# Patient Record
Sex: Female | Born: 1945
Health system: Southern US, Community
[De-identification: ages and names within clinical notes are randomized; demographics above are authoritative.]

## PROBLEM LIST (undated history)

## (undated) DIAGNOSIS — Z974 Presence of external hearing-aid: Secondary | ICD-10-CM

## (undated) DIAGNOSIS — J449 Chronic obstructive pulmonary disease, unspecified: Secondary | ICD-10-CM

## (undated) DIAGNOSIS — I10 Essential (primary) hypertension: Secondary | ICD-10-CM

## (undated) DIAGNOSIS — I272 Pulmonary hypertension, unspecified: Secondary | ICD-10-CM

## (undated) DIAGNOSIS — M810 Age-related osteoporosis without current pathological fracture: Secondary | ICD-10-CM

## (undated) DIAGNOSIS — Z87891 Personal history of nicotine dependence: Secondary | ICD-10-CM

## (undated) DIAGNOSIS — R0600 Dyspnea, unspecified: Secondary | ICD-10-CM

## (undated) DIAGNOSIS — E785 Hyperlipidemia, unspecified: Secondary | ICD-10-CM

## (undated) HISTORY — PX: ABDOMINAL HYSTERECTOMY: SHX81

## (undated) HISTORY — DX: Essential (primary) hypertension: I10

## (undated) HISTORY — PX: OTHER SURGICAL HISTORY: SHX169

## (undated) HISTORY — DX: Personal history of nicotine dependence: Z87.891

## (undated) HISTORY — DX: Chronic obstructive pulmonary disease, unspecified: J44.9

## (undated) HISTORY — DX: Hyperlipidemia, unspecified: E78.5

## (undated) HISTORY — DX: Age-related osteoporosis without current pathological fracture: M81.0

---

## 2006-11-16 ENCOUNTER — Emergency Department: Payer: Self-pay | Admitting: Emergency Medicine

## 2011-06-11 ENCOUNTER — Ambulatory Visit: Payer: Self-pay | Admitting: Unknown Physician Specialty

## 2011-07-08 ENCOUNTER — Ambulatory Visit: Payer: Self-pay | Admitting: Unknown Physician Specialty

## 2011-07-23 LAB — HM COLONOSCOPY

## 2012-11-23 ENCOUNTER — Ambulatory Visit: Payer: Self-pay | Admitting: Family Medicine

## 2013-12-02 ENCOUNTER — Ambulatory Visit: Payer: Self-pay

## 2013-12-30 ENCOUNTER — Ambulatory Visit: Payer: Self-pay

## 2014-03-15 ENCOUNTER — Emergency Department: Payer: Self-pay | Admitting: Emergency Medicine

## 2015-01-12 DIAGNOSIS — E785 Hyperlipidemia, unspecified: Secondary | ICD-10-CM | POA: Diagnosis not present

## 2015-01-12 DIAGNOSIS — Z23 Encounter for immunization: Secondary | ICD-10-CM | POA: Diagnosis not present

## 2015-01-12 DIAGNOSIS — J449 Chronic obstructive pulmonary disease, unspecified: Secondary | ICD-10-CM | POA: Diagnosis not present

## 2015-01-12 DIAGNOSIS — I10 Essential (primary) hypertension: Secondary | ICD-10-CM | POA: Diagnosis not present

## 2015-01-22 ENCOUNTER — Ambulatory Visit
Admit: 2015-01-22 | Disposition: A | Discharge status: Home / Self Care | Payer: Self-pay | Attending: Unknown Physician Specialty | Admitting: Unknown Physician Specialty

## 2015-01-22 DIAGNOSIS — E049 Nontoxic goiter, unspecified: Secondary | ICD-10-CM | POA: Diagnosis not present

## 2015-01-22 DIAGNOSIS — I2510 Atherosclerotic heart disease of native coronary artery without angina pectoris: Secondary | ICD-10-CM | POA: Diagnosis not present

## 2015-01-22 DIAGNOSIS — R918 Other nonspecific abnormal finding of lung field: Secondary | ICD-10-CM | POA: Diagnosis not present

## 2015-01-22 DIAGNOSIS — M419 Scoliosis, unspecified: Secondary | ICD-10-CM | POA: Diagnosis not present

## 2015-01-22 DIAGNOSIS — J440 Chronic obstructive pulmonary disease with acute lower respiratory infection: Secondary | ICD-10-CM | POA: Diagnosis not present

## 2015-07-26 DIAGNOSIS — M810 Age-related osteoporosis without current pathological fracture: Secondary | ICD-10-CM

## 2015-07-26 DIAGNOSIS — I10 Essential (primary) hypertension: Secondary | ICD-10-CM | POA: Insufficient documentation

## 2015-07-26 DIAGNOSIS — F17200 Nicotine dependence, unspecified, uncomplicated: Secondary | ICD-10-CM | POA: Insufficient documentation

## 2015-07-26 DIAGNOSIS — E785 Hyperlipidemia, unspecified: Secondary | ICD-10-CM

## 2015-07-26 DIAGNOSIS — M8580 Other specified disorders of bone density and structure, unspecified site: Secondary | ICD-10-CM | POA: Insufficient documentation

## 2015-07-26 DIAGNOSIS — J449 Chronic obstructive pulmonary disease, unspecified: Secondary | ICD-10-CM | POA: Insufficient documentation

## 2015-07-27 ENCOUNTER — Encounter: Payer: Self-pay | Admitting: Unknown Physician Specialty

## 2015-07-27 ENCOUNTER — Ambulatory Visit (INDEPENDENT_AMBULATORY_CARE_PROVIDER_SITE_OTHER): Payer: Medicare Other | Admitting: Unknown Physician Specialty

## 2015-07-27 VITALS — BP 168/79 | HR 64 | Temp 98.3°F | Ht 59.6 in | Wt 150.4 lb

## 2015-07-27 DIAGNOSIS — Z23 Encounter for immunization: Secondary | ICD-10-CM

## 2015-07-27 DIAGNOSIS — R918 Other nonspecific abnormal finding of lung field: Secondary | ICD-10-CM

## 2015-07-27 DIAGNOSIS — E785 Hyperlipidemia, unspecified: Secondary | ICD-10-CM

## 2015-07-27 DIAGNOSIS — J449 Chronic obstructive pulmonary disease, unspecified: Secondary | ICD-10-CM

## 2015-07-27 DIAGNOSIS — Z0000 Encounter for general adult medical examination without abnormal findings: Secondary | ICD-10-CM | POA: Diagnosis not present

## 2015-07-27 DIAGNOSIS — F17200 Nicotine dependence, unspecified, uncomplicated: Secondary | ICD-10-CM | POA: Diagnosis not present

## 2015-07-27 DIAGNOSIS — I10 Essential (primary) hypertension: Secondary | ICD-10-CM

## 2015-07-27 LAB — MICROALBUMIN, URINE WAIVED
CREATININE, URINE WAIVED: 200 mg/dL (ref 10–300)
MICROALB, UR WAIVED: 10 mg/L (ref 0–19)

## 2015-07-27 MED ORDER — BUDESONIDE-FORMOTEROL FUMARATE 160-4.5 MCG/ACT IN AERO: 2.0000 | INHALATION_SPRAY | Freq: Two times a day (BID) | RESPIRATORY_TRACT | Status: DC

## 2015-07-27 NOTE — Assessment & Plan Note (Signed)
White coat hypertension.  Daughter sill second check at home

## 2015-07-27 NOTE — Progress Notes (Signed)
BP 168/79 mmHg  Pulse 64  Temp(Src) 98.3 F (36.8 C)  Ht 4' 11.6" (1.514 m)  Wt 150 lb 6.4 oz (68.221 kg)  BMI 29.76 kg/m2  SpO2 95%  LMP  (LMP Unknown)   Subjective:    Patient ID: Madison Franklin, female    DOB: 03-Feb-1946, 69 y.o.   MRN: [id number]  HPI: [name] is a 69 y.o. female  Chief Complaint  Patient presents with  . Annual Exam   Hypertension This is a chronic (SBP of 130's outside the office.  Well known white coat hypertension.  ) problem. The problem is unchanged. The problem is controlled. Associated symptoms include anxiety, malaise/fatigue and shortness of breath. Pertinent negatives include no chest pain, headaches or palpitations.   COPD SOB worse when she walks.  She is smoking 4 cigaretts/day.  Due for chest CT in April for f/u lung nodule.  Not taking Symbicort due to cough.    Fall Risk  07/27/2015  Falls in the past year? No   Depression screen PHQ 2/9 07/27/2015  Decreased Interest 0  Down, Depressed, Hopeless 1  PHQ - 2 Score 1    Functional Status Survey: Is the patient deaf or have difficulty hearing?: Yes Does the patient have difficulty seeing, even when wearing glasses/contacts?: Yes Does the patient have difficulty concentrating, remembering, or making decisions?: No Does the patient have difficulty walking or climbing stairs?: Yes Does the patient have difficulty dressing or bathing?: No Does the patient have difficulty doing errands alone such as visiting a doctor's office or shopping?: No  Mental status exam difficult due to hearing.  Able to recall 3 objects.    Relevant past medical, surgical, family and social history reviewed and updated as indicated. Interim medical history since our last visit reviewed. Allergies and medications reviewed and updated.  Review of Systems  Constitutional: Positive for malaise/fatigue.  Respiratory: Positive for shortness of breath.   Cardiovascular: Negative for chest pain and palpitations.   Neurological: Negative for headaches.    Per HPI unless specifically indicated above     Objective:    BP 168/79 mmHg  Pulse 64  Temp(Src) 98.3 F (36.8 C)  Ht 4' 11.6" (1.514 m)  Wt 150 lb 6.4 oz (68.221 kg)  BMI 29.76 kg/m2  SpO2 95%  LMP  (LMP Unknown)  Wt Readings from Last 3 Encounters:  07/27/15 150 lb 6.4 oz (68.221 kg)  01/12/15 154 lb (69.854 kg)    Physical Exam  Constitutional: She is oriented to person, place, and time. She appears well-developed and well-nourished. No distress.  HENT:  Head: Normocephalic and atraumatic.  Eyes: Conjunctivae and lids are normal. Right eye exhibits no discharge. Left eye exhibits no discharge. No scleral icterus.  Cardiovascular: Normal rate and regular rhythm.   Pulmonary/Chest: Effort normal. No respiratory distress. She has wheezes.  Abdominal: Normal appearance and bowel sounds are normal. She exhibits no distension. There is no splenomegaly or hepatomegaly. There is no tenderness.  Musculoskeletal: Normal range of motion.  Neurological: She is alert and oriented to person, place, and time.  Skin: Skin is intact. No rash noted. No pallor.  Psychiatric: She has a normal mood and affect. Her behavior is normal. Judgment and thought content normal.       Assessment & Plan:   Problem List Items Addressed This Visit      Unprioritized   Tobacco use disorder    Encouraged to quit smoking      COPD,  severe ([place])    Restart Spireva.  May need pulmonary referral      Relevant Medications   budesonide-formoterol (SYMBICORT) 160-4.5 MCG/ACT inhaler   Hypertension    White coat hypertension.  Daughter sill second check at home      Relevant Orders   Comprehensive metabolic panel   Microalbumin, Urine Waived   Uric acid   Hyperlipidemia   Relevant Orders   Lipid Panel w/o Chol/HDL Ratio    Other Visit Diagnoses    Immunization due    -  Primary    Relevant Orders    Flu Vaccine QUAD 36+ mos PF IM (Fluarix &  Fluzone Quad PF) (Completed)    Td vaccine greater than or equal to 7yo preservative free IM (Completed)    Multiple lung nodules on CT        Relevant Orders    CT CHEST NODULE FOLLOW UP LOW DOSE W/O    Routine general medical examination at a health care facility        Relevant Orders    Hepatitis C antibody    MM DIGITAL SCREENING BILATERAL        Follow up plan: Return in about 3 months (around 10/27/2015) for COPD.

## 2015-07-27 NOTE — Assessment & Plan Note (Signed)
Encouraged to quit smoking.  

## 2015-07-27 NOTE — Assessment & Plan Note (Signed)
Restart Spireva.  May need pulmonary referral

## 2015-07-28 LAB — LIPID PANEL W/O CHOL/HDL RATIO
Cholesterol, Total: 153 mg/dL (ref 100–199)
HDL: 53 mg/dL (ref >39)
LDL CALC: 74 mg/dL (ref 0–99)
Triglycerides: 128 mg/dL (ref 0–149)
VLDL CHOLESTEROL CAL: 26 mg/dL (ref 5–40)

## 2015-07-28 LAB — COMPREHENSIVE METABOLIC PANEL
ALBUMIN: 4.5 g/dL (ref 3.5–4.8)
ALT: 8 IU/L (ref 0–32)
AST: 17 IU/L (ref 0–40)
Albumin/Globulin Ratio: 1.8 (ref 1.1–2.5)
Alkaline Phosphatase: 87 IU/L (ref 39–117)
BILIRUBIN TOTAL: 0.3 mg/dL (ref 0.0–1.2)
BUN / CREAT RATIO: 25 (ref 11–26)
BUN: 23 mg/dL (ref 8–27)
CHLORIDE: 84 mmol/L — AB (ref 97–108)
CO2: 19 mmol/L (ref 18–29)
CREATININE: 0.91 mg/dL (ref 0.57–1.00)
Calcium: 10 mg/dL (ref 8.7–10.3)
GFR calc non Af Amer: 64 mL/min/{1.73_m2} (ref >59)
GFR, EST AFRICAN AMERICAN: 74 mL/min/{1.73_m2} (ref >59)
GLOBULIN, TOTAL: 2.5 g/dL (ref 1.5–4.5)
GLUCOSE: 88 mg/dL (ref 65–99)
Potassium: 5.4 mmol/L — ABNORMAL HIGH (ref 3.5–5.2)
Sodium: 124 mmol/L — ABNORMAL LOW (ref 134–144)
TOTAL PROTEIN: 7 g/dL (ref 6.0–8.5)

## 2015-07-28 LAB — HEPATITIS C ANTIBODY

## 2015-07-28 LAB — URIC ACID: Uric Acid: 5.7 mg/dL (ref 2.5–7.1)

## 2015-07-30 ENCOUNTER — Encounter: Payer: Self-pay | Admitting: Unknown Physician Specialty

## 2015-07-30 ENCOUNTER — Telehealth: Payer: Self-pay | Admitting: Unknown Physician Specialty

## 2015-07-30 NOTE — Telephone Encounter (Signed)
Phone call with husband as to low sodium.  Patient was there but hard of hearing.  She needs to stop HCTZ and drink less fluids.  Will recheck this on the 26th of this month.  Discussed patient with Dr. Jeananne Rama.

## 2015-08-15 ENCOUNTER — Encounter: Payer: Self-pay | Admitting: Family Medicine

## 2015-08-15 ENCOUNTER — Other Ambulatory Visit: Payer: Self-pay | Admitting: Family Medicine

## 2015-08-15 DIAGNOSIS — Z87891 Personal history of nicotine dependence: Secondary | ICD-10-CM

## 2015-08-15 HISTORY — DX: Personal history of nicotine dependence: Z87.891

## 2015-08-17 ENCOUNTER — Inpatient Hospital Stay: Payer: Medicare Other | Attending: Family Medicine | Admitting: Family Medicine

## 2015-08-17 ENCOUNTER — Encounter: Payer: Self-pay | Admitting: Family Medicine

## 2015-08-17 ENCOUNTER — Ambulatory Visit
Admission: RE | Admit: 2015-08-17 | Discharge: 2015-08-17 | Disposition: A | Discharge status: Home / Self Care | Payer: Medicare Other | Source: Ambulatory Visit | Attending: Family Medicine | Admitting: Family Medicine

## 2015-08-17 DIAGNOSIS — Z122 Encounter for screening for malignant neoplasm of respiratory organs: Secondary | ICD-10-CM | POA: Diagnosis not present

## 2015-08-17 DIAGNOSIS — F17210 Nicotine dependence, cigarettes, uncomplicated: Secondary | ICD-10-CM

## 2015-08-17 DIAGNOSIS — Z87891 Personal history of nicotine dependence: Secondary | ICD-10-CM | POA: Diagnosis not present

## 2015-08-17 DIAGNOSIS — R918 Other nonspecific abnormal finding of lung field: Secondary | ICD-10-CM | POA: Insufficient documentation

## 2015-08-17 NOTE — Progress Notes (Signed)
In accordance with CMS guidelines, patient has meet eligibility criteria including age, absence of signs or symptoms of lung cancer, the specific calculation of cigarette smoking pack-years was 30 years and is a current smoker.   A shared decision-making session was conducted prior to the performance of CT scan. This includes one or more decision aids, includes benefits and harms of screening, follow-up diagnostic testing, over-diagnosis, false positive rate, and total radiation exposure.  Counseling on the importance of adherence to annual lung cancer LDCT screening, impact of co-morbidities, and ability or willingness to undergo diagnosis and treatment is imperative for compliance of the program.  Counseling on the importance of continued smoking cessation for former smokers; the importance of smoking cessation for current smokers and information about tobacco cessation interventions have been given to patient including the [place] at [facility], 1800 quit [place], as well as Cancer Center specific smoking cessation programs.  Written order for lung cancer screening with LDCT has been given to the patient and any and all questions have been answered to the best of my abilities.   Yearly follow up will be scheduled by Shawn Perkins, Thoracic Navigator.   

## 2015-08-23 ENCOUNTER — Other Ambulatory Visit: Payer: Self-pay | Admitting: Unknown Physician Specialty

## 2015-09-03 ENCOUNTER — Telehealth: Payer: Self-pay | Admitting: *Deleted

## 2015-09-03 NOTE — Telephone Encounter (Signed)
Notified patient of LDCT lung cancer screening results of Lung Rads 2 finding with recommendation for 12 month follow up imaging. Also notified of incidental finding noted below.   IMPRESSION: Scattered bilateral pulmonary nodules measuring up to 5.8 mm in the posterior right upper lobe. Lung-RADS Category 2, benign appearance or behavior. Continue annual screening with low-dose chest CT without contrast in 12 months.

## 2015-09-21 ENCOUNTER — Other Ambulatory Visit: Payer: Self-pay | Admitting: Unknown Physician Specialty

## 2015-09-24 ENCOUNTER — Other Ambulatory Visit: Payer: Self-pay | Admitting: Unknown Physician Specialty

## 2015-10-23 ENCOUNTER — Other Ambulatory Visit: Payer: Self-pay | Admitting: Unknown Physician Specialty

## 2015-11-02 ENCOUNTER — Ambulatory Visit (INDEPENDENT_AMBULATORY_CARE_PROVIDER_SITE_OTHER): Payer: Medicare Other | Admitting: Unknown Physician Specialty

## 2015-11-02 ENCOUNTER — Encounter: Payer: Self-pay | Admitting: Unknown Physician Specialty

## 2015-11-02 VITALS — BP 174/101 | HR 71 | Temp 98.5°F | Ht 59.0 in | Wt 151.0 lb

## 2015-11-02 DIAGNOSIS — I10 Essential (primary) hypertension: Secondary | ICD-10-CM | POA: Diagnosis not present

## 2015-11-02 DIAGNOSIS — J449 Chronic obstructive pulmonary disease, unspecified: Secondary | ICD-10-CM

## 2015-11-02 DIAGNOSIS — E871 Hypo-osmolality and hyponatremia: Secondary | ICD-10-CM | POA: Insufficient documentation

## 2015-11-02 NOTE — Assessment & Plan Note (Addendum)
Improve compliance with Symbicort.  Encouraged to quit smoking

## 2015-11-02 NOTE — Assessment & Plan Note (Signed)
Recheck CMP 

## 2015-11-02 NOTE — Progress Notes (Signed)
++++++++++--+  BP 174/101 mmHg  Pulse 71  Temp(Src) 98.5 F (36.9 C)  Ht 4\' 11"  (1.499 m)  Wt 151 lb (68.493 kg)  BMI 30.48 kg/m2  SpO2 93%  LMP  (LMP Unknown)   Subjective:    Patient ID: Madison Franklin, female    DOB: [birth year], 70 y.o.   MRN: [id number]  HPI: Madison Franklin is a 70 y.o. female  Chief Complaint  Patient presents with  . Hyperlipidemia  . Hypertension   Hypertension Well known white coat hypertension Using medications without difficulty Average home BPs checks twice a week and in the low 140's/70's   No problems or lightheadedness No chest pain with exertion or shortness of breath No Edema   COPD Not doing much better.  Using her inhaler only occasionally Feels symptoms are well controlled unless she does a "whole lot of walking Night time symptoms:none ER visits since last visit: none Increased cough: no Increased SOB: no    Relevant past medical, surgical, family and social history reviewed and updated as indicated. Interim medical history since our last visit reviewed. Allergies and medications reviewed and updated.  Review of Systems  Per HPI unless specifically indicated above     Objective:    BP 174/101 mmHg  Pulse 71  Temp(Src) 98.5 F (36.9 C)  Ht 4\' 11"  (1.499 m)  Wt 151 lb (68.493 kg)  BMI 30.48 kg/m2  SpO2 93%  LMP  (LMP Unknown)  Wt Readings from Last 3 Encounters:  11/02/15 151 lb (68.493 kg)  08/17/15 195 lb (88.451 kg)  07/27/15 150 lb 6.4 oz (68.221 kg)    Physical Exam  Constitutional: She is oriented to person, place, and time. She appears well-developed and well-nourished. No distress.  HENT:  Head: Normocephalic and atraumatic.  Eyes: Conjunctivae and lids are normal. Right eye exhibits no discharge. Left eye exhibits no discharge. No scleral icterus.  Neck: Normal range of motion. Neck supple. No JVD present. Carotid bruit is not present.  Cardiovascular: Normal rate, regular rhythm and normal heart sounds.    Pulmonary/Chest: Effort normal and breath sounds normal.  Abdominal: Normal appearance. There is no splenomegaly or hepatomegaly.  Musculoskeletal: Normal range of motion.  Neurological: She is alert and oriented to person, place, and time.  Skin: Skin is warm, dry and intact. No rash noted. No pallor.  Psychiatric: She has a normal mood and affect. Her behavior is normal. Judgment and thought content normal.    Results for orders placed or performed in visit on 07/27/15  Comprehensive metabolic panel  Result Value Ref Range   Glucose 88 65 - 99 mg/dL   BUN 23 8 - 27 mg/dL   Creatinine, Ser 0.91 0.57 - 1.00 mg/dL   GFR calc non Af Amer 64 >59 mL/min/1.73   GFR calc Af Amer 74 >59 mL/min/1.73   BUN/Creatinine Ratio 25 11 - 26   Sodium 124 (L) 134 - 144 mmol/L   Potassium 5.4 (H) 3.5 - 5.2 mmol/L   Chloride 84 (L) 97 - 108 mmol/L   CO2 19 18 - 29 mmol/L   Calcium 10.0 8.7 - 10.3 mg/dL   Total Protein 7.0 6.0 - 8.5 g/dL   Albumin 4.5 3.5 - 4.8 g/dL   Globulin, Total 2.5 1.5 - 4.5 g/dL   Albumin/Globulin Ratio 1.8 1.1 - 2.5   Bilirubin Total 0.3 0.0 - 1.2 mg/dL   Alkaline Phosphatase 87 39 - 117 IU/L   AST 17 0 - 40 IU/L  ALT 8 0 - 32 IU/L  Lipid Panel w/o Chol/HDL Ratio  Result Value Ref Range   Cholesterol, Total 153 100 - 199 mg/dL   Triglycerides 128 0 - 149 mg/dL   HDL 53 >39 mg/dL   VLDL Cholesterol Cal 26 5 - 40 mg/dL   LDL Calculated 74 0 - 99 mg/dL  Microalbumin, Urine Waived  Result Value Ref Range   Microalb, Ur Waived 10 0 - 19 mg/L   Creatinine, Urine Waived 200 10 - 300 mg/dL   Microalb/Creat Ratio <30 <30 mg/g  Uric acid  Result Value Ref Range   Uric Acid 5.7 2.5 - 7.1 mg/dL  Hepatitis C antibody  Result Value Ref Range   Hep C Virus Ab <0.1 0.0 - 0.9 s/co ratio      Assessment & Plan:   Problem List Items Addressed This Visit      Unprioritized   COPD, severe ([facility])    Improve compliance with Symbicort.  Encouraged to quit smoking       Hypertension - Primary    Well known white coat hypertension.  Stopped HCTZ due to hyponatremia.  Check labs today and consider restarting 12.5 mg.        Relevant Orders   Comprehensive metabolic panel   Hyponatremia    Recheck CMP      Relevant Orders   Comprehensive metabolic panel       Follow up plan: Return in about 3 months (around 01/31/2016).

## 2015-11-02 NOTE — Assessment & Plan Note (Addendum)
Well known white coat hypertension.  Stopped HCTZ due to hyponatremia.  Check labs today and consider restarting 12.5 mg.

## 2015-11-03 LAB — COMPREHENSIVE METABOLIC PANEL
A/G RATIO: 1.4 (ref 1.1–2.5)
ALT: 8 IU/L (ref 0–32)
AST: 15 IU/L (ref 0–40)
Albumin: 4.2 g/dL (ref 3.5–4.8)
Alkaline Phosphatase: 118 IU/L — ABNORMAL HIGH (ref 39–117)
BILIRUBIN TOTAL: 0.4 mg/dL (ref 0.0–1.2)
BUN/Creatinine Ratio: 12 (ref 11–26)
BUN: 11 mg/dL (ref 8–27)
CALCIUM: 9.5 mg/dL (ref 8.7–10.3)
CHLORIDE: 102 mmol/L (ref 96–106)
CO2: 19 mmol/L (ref 18–29)
Creatinine, Ser: 0.89 mg/dL (ref 0.57–1.00)
GFR, EST AFRICAN AMERICAN: 76 mL/min/{1.73_m2} (ref >59)
GFR, EST NON AFRICAN AMERICAN: 66 mL/min/{1.73_m2} (ref >59)
GLOBULIN, TOTAL: 3 g/dL (ref 1.5–4.5)
Glucose: 98 mg/dL (ref 65–99)
POTASSIUM: 4.5 mmol/L (ref 3.5–5.2)
SODIUM: 140 mmol/L (ref 134–144)
TOTAL PROTEIN: 7.2 g/dL (ref 6.0–8.5)

## 2015-11-06 ENCOUNTER — Other Ambulatory Visit: Payer: Self-pay | Admitting: Unknown Physician Specialty

## 2015-11-06 MED ORDER — HYDROCHLOROTHIAZIDE 12.5 MG PO CAPS: 12.5000 mg | ORAL_CAPSULE | Freq: Every day | ORAL | Status: DC

## 2015-11-23 ENCOUNTER — Other Ambulatory Visit: Payer: Self-pay | Admitting: Unknown Physician Specialty

## 2016-02-01 ENCOUNTER — Ambulatory Visit (INDEPENDENT_AMBULATORY_CARE_PROVIDER_SITE_OTHER): Payer: Medicare Other | Admitting: Unknown Physician Specialty

## 2016-02-01 ENCOUNTER — Encounter: Payer: Self-pay | Admitting: Unknown Physician Specialty

## 2016-02-01 VITALS — BP 146/74 | HR 60 | Temp 98.2°F | Ht 59.7 in | Wt 149.0 lb

## 2016-02-01 DIAGNOSIS — I10 Essential (primary) hypertension: Secondary | ICD-10-CM

## 2016-02-01 DIAGNOSIS — J449 Chronic obstructive pulmonary disease, unspecified: Secondary | ICD-10-CM | POA: Diagnosis not present

## 2016-02-01 DIAGNOSIS — E785 Hyperlipidemia, unspecified: Secondary | ICD-10-CM | POA: Diagnosis not present

## 2016-02-01 MED ORDER — ALBUTEROL SULFATE HFA 108 (90 BASE) MCG/ACT IN AERS: 2.0000 | INHALATION_SPRAY | Freq: Four times a day (QID) | RESPIRATORY_TRACT | Status: DC | PRN

## 2016-02-01 MED ORDER — BUDESONIDE-FORMOTEROL FUMARATE 160-4.5 MCG/ACT IN AERO: 2.0000 | INHALATION_SPRAY | Freq: Two times a day (BID) | RESPIRATORY_TRACT | Status: DC

## 2016-02-01 NOTE — Addendum Note (Signed)
Addended by: [name] on: 02/01/2016 09:02 AM   Modules accepted: Orders

## 2016-02-01 NOTE — Progress Notes (Signed)
BP 146/74 mmHg  Pulse 60  Temp(Src) 98.2 F (36.8 C)  Ht 4' 11.7" (1.516 m)  Wt 149 lb (67.586 kg)  BMI 29.41 kg/m2  SpO2 92%  LMP  (LMP Unknown)   Subjective:    Patient ID: Madison Franklin, female    DOB: 1946/02/25, 70 y.o.   MRN: [id number]  HPI: Madison Franklin is a 70 y.o. female  Chief Complaint  Patient presents with  . Hyperlipidemia  . Hypertension   Hypertension Using medications without difficulty Average home BPs 131/75  No problems or lightheadedness No chest pain with exertion or shortness of breath No Edema   Hyperlipidemia Using medications without problems: No Muscle aches  Diet compliance: good Exercise: "sometimes"  COPD Feels symptoms are well controlled while on Symbicort.  But, finding Symbicort too expensive.   Daughter has noticed more wheezing No ER visits since last visit    Relevant past medical, surgical, family and social history reviewed and updated as indicated. Interim medical history since our last visit reviewed. Allergies and medications reviewed and updated.  Review of Systems  Constitutional: Negative.   HENT: Negative.   Eyes: Negative.   Respiratory: Negative.   Cardiovascular: Negative.   Gastrointestinal: Negative.   Endocrine: Negative.   Genitourinary: Negative.   Musculoskeletal: Negative.   Skin: Negative.   Allergic/Immunologic: Negative.   Neurological: Negative.   Hematological: Negative.   Psychiatric/Behavioral: Negative.     Per HPI unless specifically indicated above     Objective:    BP 146/74 mmHg  Pulse 60  Temp(Src) 98.2 F (36.8 C)  Ht 4' 11.7" (1.516 m)  Wt 149 lb (67.586 kg)  BMI 29.41 kg/m2  SpO2 92%  LMP  (LMP Unknown)  Wt Readings from Last 3 Encounters:  02/01/16 149 lb (67.586 kg)  11/02/15 151 lb (68.493 kg)  08/17/15 195 lb (88.451 kg)    Physical Exam  Constitutional: She is oriented to person, place, and time. She appears well-developed and well-nourished. No distress.   HENT:  Head: Normocephalic and atraumatic.  Eyes: Conjunctivae and lids are normal. Right eye exhibits no discharge. Left eye exhibits no discharge. No scleral icterus.  Neck: Normal range of motion. Neck supple. No JVD present. Carotid bruit is not present.  Cardiovascular: Normal rate, regular rhythm and normal heart sounds.   Pulmonary/Chest: Effort normal and breath sounds normal.  Abdominal: Normal appearance. There is no splenomegaly or hepatomegaly.  Musculoskeletal: Normal range of motion.  Neurological: She is alert and oriented to person, place, and time.  Skin: Skin is warm, dry and intact. No rash noted. No pallor.  Psychiatric: She has a normal mood and affect. Her behavior is normal. Judgment and thought content normal.    Results for orders placed or performed in visit on 11/02/15  Comprehensive metabolic panel  Result Value Ref Range   Glucose 98 65 - 99 mg/dL   BUN 11 8 - 27 mg/dL   Creatinine, Ser 0.89 0.57 - 1.00 mg/dL   GFR calc non Af Amer 66 >59 mL/min/1.73   GFR calc Af Amer 76 >59 mL/min/1.73   BUN/Creatinine Ratio 12 11 - 26   Sodium 140 134 - 144 mmol/L   Potassium 4.5 3.5 - 5.2 mmol/L   Chloride 102 96 - 106 mmol/L   CO2 19 18 - 29 mmol/L   Calcium 9.5 8.7 - 10.3 mg/dL   Total Protein 7.2 6.0 - 8.5 g/dL   Albumin 4.2 3.5 - 4.8 g/dL   Globulin,  Total 3.0 1.5 - 4.5 g/dL   Albumin/Globulin Ratio 1.4 1.1 - 2.5   Bilirubin Total 0.4 0.0 - 1.2 mg/dL   Alkaline Phosphatase 118 (H) 39 - 117 IU/L   AST 15 0 - 40 IU/L   ALT 8 0 - 32 IU/L      Assessment & Plan:   Problem List Items Addressed This Visit      Unprioritized   COPD, severe ([place])    Samples given of Symbicort and daughter will look into pt assistance.  Needs albuterol rescue      Relevant Medications   budesonide-formoterol (SYMBICORT) 160-4.5 MCG/ACT inhaler   albuterol (PROVENTIL HFA;VENTOLIN HFA) 108 (90 Base) MCG/ACT inhaler   Hyperlipidemia - Primary    Check lipid panel       Hypertension    Stable with excellent numbers at home.  Continue present meds.  Check CMP with history of hyponatremia          Follow up plan: Return in about 6 months (around 08/02/2016) for physical.

## 2016-02-01 NOTE — Assessment & Plan Note (Signed)
Stable with excellent numbers at home.  Continue present meds.  Check CMP with history of hyponatremia

## 2016-02-01 NOTE — Assessment & Plan Note (Signed)
Samples given of Symbicort and daughter will look into pt assistance.  Needs albuterol rescue

## 2016-02-01 NOTE — Assessment & Plan Note (Signed)
Check lipid panel  

## 2016-02-02 LAB — COMPREHENSIVE METABOLIC PANEL
ALBUMIN: 4.3 g/dL (ref 3.5–4.8)
ALT: 8 IU/L (ref 0–32)
AST: 15 IU/L (ref 0–40)
Albumin/Globulin Ratio: 1.5 (ref 1.2–2.2)
Alkaline Phosphatase: 108 IU/L (ref 39–117)
BUN / CREAT RATIO: 18 (ref 12–28)
BUN: 21 mg/dL (ref 8–27)
Bilirubin Total: 0.4 mg/dL (ref 0.0–1.2)
CALCIUM: 9.7 mg/dL (ref 8.7–10.3)
CO2: 22 mmol/L (ref 18–29)
Chloride: 97 mmol/L (ref 96–106)
Creatinine, Ser: 1.17 mg/dL — ABNORMAL HIGH (ref 0.57–1.00)
GFR, EST AFRICAN AMERICAN: 54 mL/min/{1.73_m2} — AB (ref >59)
GFR, EST NON AFRICAN AMERICAN: 47 mL/min/{1.73_m2} — AB (ref >59)
GLOBULIN, TOTAL: 2.9 g/dL (ref 1.5–4.5)
Glucose: 86 mg/dL (ref 65–99)
Potassium: 5 mmol/L (ref 3.5–5.2)
SODIUM: 136 mmol/L (ref 134–144)
TOTAL PROTEIN: 7.2 g/dL (ref 6.0–8.5)

## 2016-02-02 LAB — LIPID PANEL W/O CHOL/HDL RATIO
Cholesterol, Total: 170 mg/dL (ref 100–199)
HDL: 48 mg/dL (ref >39)
LDL Calculated: 90 mg/dL (ref 0–99)
Triglycerides: 161 mg/dL — ABNORMAL HIGH (ref 0–149)
VLDL Cholesterol Cal: 32 mg/dL (ref 5–40)

## 2016-04-04 ENCOUNTER — Other Ambulatory Visit: Payer: Self-pay | Admitting: Unknown Physician Specialty

## 2016-05-21 ENCOUNTER — Other Ambulatory Visit: Payer: Self-pay | Admitting: Unknown Physician Specialty

## 2016-05-21 NOTE — Telephone Encounter (Signed)
Your patient 

## 2016-06-06 ENCOUNTER — Other Ambulatory Visit: Payer: Self-pay | Admitting: Unknown Physician Specialty

## 2016-06-06 NOTE — Telephone Encounter (Signed)
Your patient 

## 2016-07-28 ENCOUNTER — Telehealth: Payer: Self-pay | Admitting: *Deleted

## 2016-07-28 NOTE — Telephone Encounter (Signed)
Notified patient that annual lung cancer screening low dose CT scan is due. Confirmed that patient is within the age range of 55-77, and asymptomatic, (no signs or symptoms of lung cancer). Patient denies illness that would prevent curative treatment for lung cancer if found. The patient is a current smoker, with a 30.75 pack year history. The shared decision making visit was done 08/17/15. Patient is agreeable for CT scan being scheduled.

## 2016-07-31 ENCOUNTER — Other Ambulatory Visit: Payer: Self-pay | Admitting: *Deleted

## 2016-07-31 DIAGNOSIS — Z87891 Personal history of nicotine dependence: Secondary | ICD-10-CM

## 2016-08-05 ENCOUNTER — Encounter: Payer: Medicare Other | Admitting: Unknown Physician Specialty

## 2016-08-22 ENCOUNTER — Ambulatory Visit
Admission: RE | Admit: 2016-08-22 | Discharge: 2016-08-22 | Disposition: A | Discharge status: Home / Self Care | Payer: Medicare Other | Source: Ambulatory Visit | Attending: Oncology | Admitting: Oncology

## 2016-08-22 ENCOUNTER — Other Ambulatory Visit: Payer: Self-pay

## 2016-08-22 ENCOUNTER — Other Ambulatory Visit: Payer: Self-pay | Admitting: Unknown Physician Specialty

## 2016-08-22 ENCOUNTER — Telehealth: Payer: Self-pay | Admitting: *Deleted

## 2016-08-22 DIAGNOSIS — F17210 Nicotine dependence, cigarettes, uncomplicated: Secondary | ICD-10-CM | POA: Insufficient documentation

## 2016-08-22 DIAGNOSIS — I700 Atherosclerosis of aorta: Secondary | ICD-10-CM | POA: Diagnosis not present

## 2016-08-22 DIAGNOSIS — R911 Solitary pulmonary nodule: Secondary | ICD-10-CM | POA: Diagnosis not present

## 2016-08-22 DIAGNOSIS — M419 Scoliosis, unspecified: Secondary | ICD-10-CM | POA: Insufficient documentation

## 2016-08-22 DIAGNOSIS — I2510 Atherosclerotic heart disease of native coronary artery without angina pectoris: Secondary | ICD-10-CM | POA: Insufficient documentation

## 2016-08-22 DIAGNOSIS — Z122 Encounter for screening for malignant neoplasm of respiratory organs: Secondary | ICD-10-CM | POA: Insufficient documentation

## 2016-08-22 DIAGNOSIS — Z87891 Personal history of nicotine dependence: Secondary | ICD-10-CM | POA: Diagnosis not present

## 2016-08-22 DIAGNOSIS — E041 Nontoxic single thyroid nodule: Secondary | ICD-10-CM | POA: Diagnosis not present

## 2016-08-22 MED ORDER — AMLODIPINE BESYLATE 5 MG PO TABS: 5.0000 mg | ORAL_TABLET | Freq: Every day | ORAL | 0 refills | Status: DC

## 2016-08-22 MED ORDER — ATENOLOL 50 MG PO TABS: 50.0000 mg | ORAL_TABLET | Freq: Every day | ORAL | 0 refills | Status: DC

## 2016-08-22 MED ORDER — ATORVASTATIN CALCIUM 10 MG PO TABS: 10.0000 mg | ORAL_TABLET | Freq: Every day | ORAL | 0 refills | Status: DC

## 2016-08-22 MED ORDER — ALBUTEROL SULFATE HFA 108 (90 BASE) MCG/ACT IN AERS: 2.0000 | INHALATION_SPRAY | Freq: Four times a day (QID) | RESPIRATORY_TRACT | 0 refills | Status: DC | PRN

## 2016-08-22 MED ORDER — LISINOPRIL 40 MG PO TABS: 40.0000 mg | ORAL_TABLET | Freq: Every day | ORAL | 0 refills | Status: DC

## 2016-08-22 NOTE — Telephone Encounter (Signed)
Called report  IMPRESSION: 1. New 5.8 mm left upper lobe nodule. Lung-Rads category 3, probably benign findings. Short-term follow-up in 6 months is recommended with repeat low-dose chest CT without contrast (please use the following order, "CT CHEST LCS NODULE FOLLOW-UP W/O CM"). These results will be called to the ordering clinician or representative by the Radiologist Assistant, and communication documented in the PACS or zVision Dashboard. 2. Aortic atherosclerosis (ICD10-170.0). Coronary artery calcification. 3. Enlarged pulmonary arteries, indicative of pulmonary arterial hypertension. 4. Dominant left thyroid nodule, as before. Consider further evaluation with thyroid ultrasound. If patient is clinically hyperthyroid, consider nuclear medicine thyroid uptake and scan.

## 2016-08-22 NOTE — Telephone Encounter (Signed)
Pt's daughter came by stated pt needs a refill on the following:  Atenolol Atorvastatin Amlodipine Pro Air Inhaler Lisinopril  Pharm is [place]. Please send ASAP. Thanks.

## 2016-08-27 ENCOUNTER — Telehealth: Payer: Self-pay | Admitting: *Deleted

## 2016-08-27 NOTE — Telephone Encounter (Signed)
After multiple attempts was able to contact patient's husband, Elenore Rota, who reports that patient is hard of hearing and prefers he obtain scan results. Notified patient's spouse of LDCT lung cancer screening results with recommendation for 6 month follow up imaging. Also notified of incidental finding noted below. Encouraged spouse to have patient discuss incidental findings with PCP to evaluate for recommended changes in risk factor, diet, or medication management. Patient verbalizes understanding. This note will be forwarded to PCP via Epic.  IMPRESSION: 1. New 5.8 mm left upper lobe nodule. Lung-Rads category 3, probably benign findings. Short-term follow-up in 6 months is recommended with repeat low-dose chest CT without contrast (please use the following order, "CT CHEST LCS NODULE FOLLOW-UP W/O CM"). These results will be called to the ordering clinician or representative by the Radiologist Assistant, and communication documented in the PACS or zVision Dashboard. 2. Aortic atherosclerosis (ICD10-170.0). Coronary artery calcification. 3. Enlarged pulmonary arteries, indicative of pulmonary arterial hypertension. 4. Dominant left thyroid nodule, as before. Consider further evaluation with thyroid ultrasound. If patient is clinically hyperthyroid, consider nuclear medicine thyroid uptake and scan.

## 2016-08-27 NOTE — Telephone Encounter (Signed)
Please set up an appointment to discuss CT resutls

## 2016-09-03 ENCOUNTER — Ambulatory Visit (INDEPENDENT_AMBULATORY_CARE_PROVIDER_SITE_OTHER): Payer: Medicare Other

## 2016-09-03 DIAGNOSIS — Z23 Encounter for immunization: Secondary | ICD-10-CM | POA: Diagnosis not present

## 2016-09-17 NOTE — Telephone Encounter (Signed)
Pt scheduled 09/29/16. Thanks.

## 2016-09-29 ENCOUNTER — Ambulatory Visit (INDEPENDENT_AMBULATORY_CARE_PROVIDER_SITE_OTHER): Payer: Medicare Other | Admitting: Family Medicine

## 2016-09-29 ENCOUNTER — Encounter (INDEPENDENT_AMBULATORY_CARE_PROVIDER_SITE_OTHER): Payer: Self-pay

## 2016-09-29 ENCOUNTER — Encounter: Payer: Self-pay | Admitting: Family Medicine

## 2016-09-29 VITALS — BP 157/72 | HR 65 | Temp 97.7°F | Ht 59.75 in | Wt 152.0 lb

## 2016-09-29 DIAGNOSIS — Z0000 Encounter for general adult medical examination without abnormal findings: Secondary | ICD-10-CM | POA: Diagnosis not present

## 2016-09-29 DIAGNOSIS — Z1211 Encounter for screening for malignant neoplasm of colon: Secondary | ICD-10-CM | POA: Diagnosis not present

## 2016-09-29 DIAGNOSIS — E782 Mixed hyperlipidemia: Secondary | ICD-10-CM

## 2016-09-29 DIAGNOSIS — Z1231 Encounter for screening mammogram for malignant neoplasm of breast: Secondary | ICD-10-CM

## 2016-09-29 DIAGNOSIS — I10 Essential (primary) hypertension: Secondary | ICD-10-CM | POA: Diagnosis not present

## 2016-09-29 DIAGNOSIS — J449 Chronic obstructive pulmonary disease, unspecified: Secondary | ICD-10-CM | POA: Diagnosis not present

## 2016-09-29 DIAGNOSIS — Z86010 Personal history of colonic polyps: Secondary | ICD-10-CM

## 2016-09-29 DIAGNOSIS — M810 Age-related osteoporosis without current pathological fracture: Secondary | ICD-10-CM

## 2016-09-29 LAB — MICROSCOPIC EXAMINATION
BACTERIA UA: NONE SEEN
RBC, UA: NONE SEEN /hpf (ref 0–?)

## 2016-09-29 LAB — UA/M W/RFLX CULTURE, ROUTINE
BILIRUBIN UA: NEGATIVE
GLUCOSE, UA: NEGATIVE
KETONES UA: NEGATIVE
LEUKOCYTES UA: NEGATIVE
NITRITE UA: NEGATIVE
Protein, UA: NEGATIVE
SPEC GRAV UA: 1.015 (ref 1.005–1.030)
Urobilinogen, Ur: 0.2 mg/dL (ref 0.2–1.0)
pH, UA: 5.5 (ref 5.0–7.5)

## 2016-09-29 MED ORDER — ATORVASTATIN CALCIUM 10 MG PO TABS: 10.0000 mg | ORAL_TABLET | Freq: Every day | ORAL | 1 refills | Status: DC

## 2016-09-29 MED ORDER — LISINOPRIL 40 MG PO TABS: 40.0000 mg | ORAL_TABLET | Freq: Every day | ORAL | 1 refills | Status: DC

## 2016-09-29 MED ORDER — ATENOLOL 50 MG PO TABS: 50.0000 mg | ORAL_TABLET | Freq: Every day | ORAL | 1 refills | Status: DC

## 2016-09-29 MED ORDER — ALBUTEROL SULFATE HFA 108 (90 BASE) MCG/ACT IN AERS: 2.0000 | INHALATION_SPRAY | Freq: Four times a day (QID) | RESPIRATORY_TRACT | 3 refills | Status: DC | PRN

## 2016-09-29 MED ORDER — HYDROCHLOROTHIAZIDE 12.5 MG PO CAPS: 12.5000 mg | ORAL_CAPSULE | Freq: Every day | ORAL | 1 refills | Status: DC

## 2016-09-29 MED ORDER — MOMETASONE FURO-FORMOTEROL FUM 200-5 MCG/ACT IN AERO: 2.0000 | INHALATION_SPRAY | Freq: Two times a day (BID) | RESPIRATORY_TRACT | 3 refills | Status: DC

## 2016-09-29 MED ORDER — AMLODIPINE BESYLATE 5 MG PO TABS: 5.0000 mg | ORAL_TABLET | Freq: Every day | ORAL | 1 refills | Status: DC

## 2016-09-29 NOTE — Assessment & Plan Note (Signed)
Await fasting labs. Continue current regimen

## 2016-09-29 NOTE — Progress Notes (Signed)
BP (!) 157/72   Pulse 65   Temp 97.7 F (36.5 C)   Ht 4' 11.75" (1.518 m)   Wt 152 lb (68.9 kg)   LMP  (LMP Unknown)   SpO2 96%   BMI 29.93 kg/m    Subjective:    Patient ID: Madison Franklin, female    DOB: Feb 26, 1946, 70 y.o.   MRN: [id number]  HPI: Madison Franklin is a 70 y.o. female presenting on 09/29/2016 for comprehensive medical examination. Current medical complaints include:none  BPs in the 138s/80s during regular home BP checks. Known white coat HTN. Taking medicines faithfully without side effects. Not taking symbicort due to cost, wanting to know if there are any others that are covered.   Menopausal Symptoms: no  Depression Screen done today and results listed below:  Depression screen [facility] 2/9 09/29/2016 07/27/2015  Decreased Interest 0 0  Down, Depressed, Hopeless 0 1  PHQ - 2 Score 0 1    The patient does not have a history of falls. I did not complete a risk assessment for falls. A plan of care for falls was not documented.   Past Medical History:  Past Medical History:  Diagnosis Date  . COPD (chronic obstructive pulmonary disease) ([place])   . Hyperlipidemia   . Hypertension   . Osteoporosis   . Personal history of tobacco use, presenting hazards to health 08/15/2015    Surgical History:  Past Surgical History:  Procedure Laterality Date  . ABDOMINAL HYSTERECTOMY    . long nodule      Medications:  Current Outpatient Prescriptions on File Prior to Visit  Medication Sig  . aspirin 81 MG tablet Take 81 mg by mouth daily.  . budesonide-formoterol (SYMBICORT) 160-4.5 MCG/ACT inhaler Inhale 2 puffs into the lungs 2 (two) times daily.   No current facility-administered medications on file prior to visit.     Allergies:  No Known Allergies  Social History:  Social History   Social History  . Marital status: Married    Spouse name: N/A  . Number of children: N/A  . Years of education: N/A   Occupational History  . Not on file.   Social History  Main Topics  . Smoking status: Current Every Day Smoker    Packs/day: 0.25    Years: 30.00    Types: Cigarettes  . Smokeless tobacco: Never Used  . Alcohol use No  . Drug use: No  . Sexual activity: Yes   Other Topics Concern  . Not on file   Social History Narrative  . No narrative on file   History  Smoking Status  . Current Every Day Smoker  . Packs/day: 0.25  . Years: 30.00  . Types: Cigarettes  Smokeless Tobacco  . Never Used   History  Alcohol Use No    Family History:  History reviewed. No pertinent family history.  Past medical history, surgical history, medications, allergies, family history and social history reviewed with patient today and changes made to appropriate areas of the chart.   Review of Systems - General ROS: negative Psychological ROS: negative Ophthalmic ROS: negative ENT ROS: negative Breast ROS: negative for breast lumps Respiratory ROS: some occasional wheezing and SOB Cardiovascular ROS: no chest pain or dyspnea on exertion Gastrointestinal ROS: no abdominal pain, change in bowel habits, or black or bloody stools Genito-Urinary ROS: no dysuria, trouble voiding, or hematuria Musculoskeletal ROS: negative Neurological ROS: no TIA or stroke symptoms Dermatological ROS: negative All other ROS negative except  what is listed above and in the HPI.      Objective:    BP (!) 157/72   Pulse 65   Temp 97.7 F (36.5 C)   Ht 4' 11.75" (1.518 m)   Wt 152 lb (68.9 kg)   LMP  (LMP Unknown)   SpO2 96%   BMI 29.93 kg/m   Wt Readings from Last 3 Encounters:  09/29/16 152 lb (68.9 kg)  08/22/16 150 lb (68 kg)  02/01/16 149 lb (67.6 kg)    Physical Exam  Constitutional: She is oriented to person, place, and time. She appears well-developed and well-nourished. No distress.  HENT:  Head: Atraumatic.  Nose: Nose normal.  Mouth/Throat: Oropharynx is clear and moist.  B/l hearing aids in place, patient deferred ear exam   Eyes: Conjunctivae  are normal. Pupils are equal, round, and reactive to light.  Neck: Normal range of motion. Neck supple. No thyromegaly present.  Cardiovascular: Normal rate and normal heart sounds.   Pulmonary/Chest: Effort normal. No respiratory distress. She has wheezes (mild diffuse wheezes). Right breast exhibits no mass and no tenderness. Left breast exhibits no mass and no tenderness.  Abdominal: Soft. Bowel sounds are normal. There is no tenderness.  Musculoskeletal: Normal range of motion.  Lymphadenopathy:    She has no cervical adenopathy.    She has no axillary adenopathy.  Neurological: She is alert and oriented to person, place, and time. No cranial nerve deficit.  Skin: Skin is warm and dry.  Psychiatric: She has a normal mood and affect. Her behavior is normal.  Nursing note and vitals reviewed.     Assessment & Plan:   Problem List Items Addressed This Visit      Cardiovascular and Mediastinum   Hypertension    Stable. Continue close home monitoring. Continue current regimen.       Relevant Medications   amLODipine (NORVASC) 5 MG tablet   atenolol (TENORMIN) 50 MG tablet   atorvastatin (LIPITOR) 10 MG tablet   hydrochlorothiazide (MICROZIDE) 12.5 MG capsule   lisinopril (PRINIVIL,ZESTRIL) 40 MG tablet   Other Relevant Orders   CBC with Differential/Platelet   Comprehensive metabolic panel   UA/M w/rflx Culture, Routine     Respiratory   COPD, severe (HCC)    Continue albuterol inhaler. Samples of dulera given today, hopefully that medication will be a bit cheaper for her. She will let me know.       Relevant Medications   albuterol (PROVENTIL HFA;VENTOLIN HFA) 108 (90 Base) MCG/ACT inhaler   mometasone-formoterol (DULERA) 200-5 MCG/ACT AERO     Musculoskeletal and Integument   Osteoporosis    UTD on Dexa scan      Relevant Orders   VITAMIN D 25 Hydroxy (Vit-D Deficiency, Fractures)     Other   Hyperlipidemia    Await fasting labs. Continue current regimen       Relevant Medications   amLODipine (NORVASC) 5 MG tablet   atenolol (TENORMIN) 50 MG tablet   atorvastatin (LIPITOR) 10 MG tablet   hydrochlorothiazide (MICROZIDE) 12.5 MG capsule   lisinopril (PRINIVIL,ZESTRIL) 40 MG tablet   Other Relevant Orders   Lipid Panel w/o Chol/HDL Ratio    Other Visit Diagnoses    Annual physical exam    -  Primary   Encounter for colonoscopy due to history of colonic polyp       Relevant Orders   Ambulatory referral to Gastroenterology   Colon cancer screening       Screening mammogram, encounter  for       Relevant Orders   MM Digital Screening       Follow up plan: Return in about 3 months (around 12/28/2016) for COPD, HTN.   LABORATORY TESTING:  - Pap smear: not applicable  IMMUNIZATIONS:   - Tdap: Tetanus vaccination status reviewed: last tetanus booster within 10 years. - Influenza: Up to date - Pneumovax: Up to date - Prevnar: Up to date - Zostavax vaccine: Refused  SCREENING: -Mammogram: Ordered today  - Colonoscopy: Ordered today   PATIENT COUNSELING:   Advised to take 1 mg of folate supplement per day if capable of pregnancy.   Sexuality: Discussed sexually transmitted diseases, partner selection, use of condoms, avoidance of unintended pregnancy  and contraceptive alternatives.   Advised to avoid cigarette smoking.  I discussed with the patient that most people either abstain from alcohol or drink within safe limits (<=14/week and <=4 drinks/occasion for males, <=7/weeks and <= 3 drinks/occasion for females) and that the risk for alcohol disorders and other health effects rises proportionally with the number of drinks per week and how often a drinker exceeds daily limits.  Discussed cessation/primary prevention of drug use and availability of treatment for abuse.   Diet: Encouraged to adjust caloric intake to maintain  or achieve ideal body weight, to reduce intake of dietary saturated fat and total fat, to limit sodium intake by  avoiding high sodium foods and not adding table salt, and to maintain adequate dietary potassium and calcium preferably from fresh fruits, vegetables, and low-fat dairy products.    stressed the importance of regular exercise  Injury prevention: Discussed safety belts, safety helmets, smoke detector, smoking near bedding or upholstery.   Dental health: Discussed importance of regular tooth brushing, flossing, and dental visits.    NEXT PREVENTATIVE PHYSICAL DUE IN 1 YEAR. Return in about 3 months (around 12/28/2016) for COPD, HTN.

## 2016-09-29 NOTE — Assessment & Plan Note (Signed)
Stable. Continue close home monitoring. Continue current regimen.

## 2016-09-29 NOTE — Assessment & Plan Note (Signed)
Continue albuterol inhaler. Samples of dulera given today, hopefully that medication will be a bit cheaper for her. She will let me know.

## 2016-09-29 NOTE — Assessment & Plan Note (Signed)
UTD on Dexa scan

## 2016-09-29 NOTE — Patient Instructions (Addendum)
Follow up in 3 months  Chronic Obstructive Pulmonary Disease Chronic obstructive pulmonary disease (COPD) is a common lung problem. In COPD, the flow of air from the lungs is limited. The way your lungs work will probably never return to normal, but there are things you can do to improve your lungs and make yourself feel better. Your doctor may treat your condition with:  Medicines.  Oxygen.  Lung surgery.  Changes to your diet.  Rehabilitation. This may involve a team of specialists. Follow these instructions at home:  Take all medicines as told by your doctor.  Avoid medicines or cough syrups that dry up your airway (such as antihistamines) and do not allow you to get rid of thick spit. You do not need to avoid them if told differently by your doctor.  If you smoke, stop. Smoking makes the problem worse.  Avoid being around things that make your breathing worse (like smoke, chemicals, and fumes).  Use oxygen therapy and therapy to help improve your lungs (pulmonary rehabilitation) if told by your doctor. If you need home oxygen therapy, ask your doctor if you should buy a tool to measure your oxygen level (oximeter).  Avoid people who have a sickness you can catch (contagious).  Avoid going outside when it is very hot, cold, or humid.  Eat healthy foods. Eat smaller meals more often. Rest before meals.  Stay active, but remember to also rest.  Make sure to get all the shots (vaccines) your doctor recommends. Ask your doctor if you need a pneumonia shot.  Learn and use tips on how to relax.  Learn and use tips on how to control your breathing as told by your doctor. Try: 1. Breathing in (inhaling) through your nose for 1 second. Then, pucker your lips and breath out (exhale) through your lips for 2 seconds. 2. Putting one hand on your belly (abdomen). Breathe in slowly through your nose for 1 second. Your hand on your belly should move out. Pucker your lips and breathe out  slowly through your lips. Your hand on your belly should move in as you breathe out.  Learn and use controlled coughing to clear thick spit from your lungs. The steps are: 1. Lean your head a little forward. 2. Breathe in deeply. 3. Try to hold your breath for 3 seconds. 4. Keep your mouth slightly open while coughing 2 times. 5. Spit any thick spit out into a tissue. 6. Rest and do the steps again 1 or 2 times as needed. Contact a doctor if:  You cough up more thick spit than usual.  There is a change in the color or thickness of the spit.  It is harder to breathe than usual.  Your breathing is faster than usual. Get help right away if:  You have shortness of breath while resting.  You have shortness of breath that stops you from:  Being able to talk.  Doing normal activities.  You chest hurts for longer than 5 minutes.  Your skin color is more blue than usual.  Your pulse oximeter shows that you have low oxygen for longer than 5 minutes. This information is not intended to replace advice given to you by your health care provider. Make sure you discuss any questions you have with your health care provider. Document Released: 03/24/2008 Document Revised: 03/13/2016 Document Reviewed: 06/02/2013 Elsevier Interactive Patient Education  2017 [employer].

## 2016-09-30 ENCOUNTER — Encounter: Payer: Self-pay | Admitting: Family Medicine

## 2016-09-30 LAB — CBC WITH DIFFERENTIAL/PLATELET
BASOS ABS: 0.1 10*3/uL (ref 0.0–0.2)
BASOS: 1 %
EOS (ABSOLUTE): 0.1 10*3/uL (ref 0.0–0.4)
Eos: 1 %
Hematocrit: 40.6 % (ref 34.0–46.6)
Hemoglobin: 12.7 g/dL (ref 11.1–15.9)
IMMATURE GRANS (ABS): 0 10*3/uL (ref 0.0–0.1)
IMMATURE GRANULOCYTES: 0 %
LYMPHS: 21 %
Lymphocytes Absolute: 2.3 10*3/uL (ref 0.7–3.1)
MCH: 26.1 pg — ABNORMAL LOW (ref 26.6–33.0)
MCHC: 31.3 g/dL — ABNORMAL LOW (ref 31.5–35.7)
MCV: 83 fL (ref 79–97)
Monocytes Absolute: 1 10*3/uL — ABNORMAL HIGH (ref 0.1–0.9)
Monocytes: 9 %
NEUTROS PCT: 68 %
Neutrophils Absolute: 7.1 10*3/uL — ABNORMAL HIGH (ref 1.4–7.0)
PLATELETS: 201 10*3/uL (ref 150–379)
RBC: 4.87 x10E6/uL (ref 3.77–5.28)
RDW: 14.4 % (ref 12.3–15.4)
WBC: 10.5 10*3/uL (ref 3.4–10.8)

## 2016-09-30 LAB — COMPREHENSIVE METABOLIC PANEL
ALT: 7 IU/L (ref 0–32)
AST: 15 IU/L (ref 0–40)
Albumin/Globulin Ratio: 1.4 (ref 1.2–2.2)
Albumin: 4.5 g/dL (ref 3.5–4.8)
Alkaline Phosphatase: 91 IU/L (ref 39–117)
BUN/Creatinine Ratio: 20 (ref 12–28)
BUN: 20 mg/dL (ref 8–27)
Bilirubin Total: 0.5 mg/dL (ref 0.0–1.2)
CALCIUM: 10 mg/dL (ref 8.7–10.3)
CO2: 23 mmol/L (ref 18–29)
Chloride: 97 mmol/L (ref 96–106)
Creatinine, Ser: 1.01 mg/dL — ABNORMAL HIGH (ref 0.57–1.00)
GFR, EST AFRICAN AMERICAN: 65 mL/min/{1.73_m2} (ref >59)
GFR, EST NON AFRICAN AMERICAN: 56 mL/min/{1.73_m2} — AB (ref >59)
GLUCOSE: 79 mg/dL (ref 65–99)
Globulin, Total: 3.2 g/dL (ref 1.5–4.5)
Potassium: 4.5 mmol/L (ref 3.5–5.2)
Sodium: 135 mmol/L (ref 134–144)
TOTAL PROTEIN: 7.7 g/dL (ref 6.0–8.5)

## 2016-09-30 LAB — LIPID PANEL W/O CHOL/HDL RATIO
Cholesterol, Total: 184 mg/dL (ref 100–199)
HDL: 52 mg/dL (ref >39)
LDL Calculated: 92 mg/dL (ref 0–99)
TRIGLYCERIDES: 200 mg/dL — AB (ref 0–149)
VLDL CHOLESTEROL CAL: 40 mg/dL (ref 5–40)

## 2016-09-30 LAB — VITAMIN D 25 HYDROXY (VIT D DEFICIENCY, FRACTURES): VIT D 25 HYDROXY: 26.3 ng/mL — AB (ref 30.0–100.0)

## 2016-10-14 ENCOUNTER — Encounter: Payer: Medicare Other | Admitting: Unknown Physician Specialty

## 2016-10-29 ENCOUNTER — Ambulatory Visit: Admission: RE | Admit: 2016-10-29 | Payer: Medicare Other | Source: Ambulatory Visit

## 2016-10-31 ENCOUNTER — Other Ambulatory Visit: Payer: Self-pay

## 2016-10-31 ENCOUNTER — Telehealth: Payer: Self-pay

## 2016-10-31 NOTE — Telephone Encounter (Signed)
Gastroenterology Pre-Procedure Review  Request Date:  Requesting Physician: Dr.   PATIENT REVIEW QUESTIONS: The patient responded to the following health history questions as indicated:    1. Are you having any GI issues? no 2. Do you have a personal history of Polyps? yes (polyps) 3. Do you have a family history of Colon Cancer or Polyps? yes (cancefr) 4. Diabetes Mellitus? no 5. Joint replacements in the past 12 months?no 6. Major health problems in the past 3 months?no 7. Any artificial heart valves, MVP, or defibrillator?no    MEDICATIONS & ALLERGIES:    Patient reports the following regarding taking any anticoagulation/antiplatelet therapy:   Plavix, Coumadin, Eliquis, Xarelto, Lovenox, Pradaxa, Brilinta, or Effient? no Aspirin? yes (ASA 81mg )  Patient confirms/reports the following medications:  Current Outpatient Prescriptions  Medication Sig Dispense Refill  . albuterol (PROVENTIL HFA;VENTOLIN HFA) 108 (90 Base) MCG/ACT inhaler Inhale 2 puffs into the lungs every 6 (six) hours as needed for wheezing or shortness of breath. 3 Inhaler 3  . amLODipine (NORVASC) 5 MG tablet Take 1 tablet (5 mg total) by mouth daily. 90 tablet 1  . aspirin 81 MG tablet Take 81 mg by mouth daily.    Marland Kitchen atenolol (TENORMIN) 50 MG tablet Take 1 tablet (50 mg total) by mouth daily. 90 tablet 1  . atorvastatin (LIPITOR) 10 MG tablet Take 1 tablet (10 mg total) by mouth daily. 90 tablet 1  . budesonide-formoterol (SYMBICORT) 160-4.5 MCG/ACT inhaler Inhale 2 puffs into the lungs 2 (two) times daily. 1 Inhaler 3  . hydrochlorothiazide (MICROZIDE) 12.5 MG capsule Take 1 capsule (12.5 mg total) by mouth daily. 90 capsule 1  . lisinopril (PRINIVIL,ZESTRIL) 40 MG tablet Take 1 tablet (40 mg total) by mouth daily. 90 tablet 1  . mometasone-formoterol (DULERA) 200-5 MCG/ACT AERO Inhale 2 puffs into the lungs 2 (two) times daily. 3 Inhaler 3   No current facility-administered medications for this visit.      Patient confirms/reports the following allergies:  No Known Allergies  No orders of the defined types were placed in this encounter.   AUTHORIZATION INFORMATION Primary Insurance: 1D#: Group #:  Secondary Insurance: 1D#: Group #:  SCHEDULE INFORMATION: Date: 11/14/16 Time: Location: [place]

## 2016-11-03 ENCOUNTER — Telehealth: Payer: Self-pay | Admitting: Gastroenterology

## 2016-11-03 NOTE — Telephone Encounter (Signed)
The notification/prior authorization case information was transmitted on 11/03/2016 at 7:37 AM CST FOR CPT: [phone]. The notification/prior authorization reference number is [id number]. Please print this page for your records.  Your Notification/Prior Authorization submission has been Approved and no further action is required for this request. Please note that it may take a few days for the procedure coverage status to be updated and viewable via the Notification/Prior Authorization Status transaction on this website

## 2016-11-07 ENCOUNTER — Encounter: Payer: Self-pay | Admitting: *Deleted

## 2016-11-13 NOTE — Discharge Instructions (Signed)

## 2016-11-14 ENCOUNTER — Ambulatory Visit: Payer: Medicare Other | Admitting: Anesthesiology

## 2016-11-14 ENCOUNTER — Ambulatory Visit
Admission: RE | Admit: 2016-11-14 | Discharge: 2016-11-14 | Disposition: A | Discharge status: Home / Self Care | Payer: Medicare Other | Source: Ambulatory Visit | Attending: Gastroenterology | Admitting: Gastroenterology

## 2016-11-14 ENCOUNTER — Encounter
Admission: RE | Disposition: A | Discharge status: Home / Self Care | Payer: Self-pay | Source: Ambulatory Visit | Attending: Gastroenterology

## 2016-11-14 DIAGNOSIS — G4730 Sleep apnea, unspecified: Secondary | ICD-10-CM | POA: Diagnosis not present

## 2016-11-14 DIAGNOSIS — I10 Essential (primary) hypertension: Secondary | ICD-10-CM | POA: Diagnosis not present

## 2016-11-14 DIAGNOSIS — Z1211 Encounter for screening for malignant neoplasm of colon: Secondary | ICD-10-CM | POA: Diagnosis not present

## 2016-11-14 DIAGNOSIS — J449 Chronic obstructive pulmonary disease, unspecified: Secondary | ICD-10-CM | POA: Diagnosis not present

## 2016-11-14 DIAGNOSIS — D120 Benign neoplasm of cecum: Secondary | ICD-10-CM | POA: Diagnosis not present

## 2016-11-14 DIAGNOSIS — Z7982 Long term (current) use of aspirin: Secondary | ICD-10-CM | POA: Insufficient documentation

## 2016-11-14 DIAGNOSIS — K635 Polyp of colon: Secondary | ICD-10-CM

## 2016-11-14 DIAGNOSIS — E785 Hyperlipidemia, unspecified: Secondary | ICD-10-CM | POA: Insufficient documentation

## 2016-11-14 DIAGNOSIS — D125 Benign neoplasm of sigmoid colon: Secondary | ICD-10-CM | POA: Diagnosis not present

## 2016-11-14 DIAGNOSIS — M810 Age-related osteoporosis without current pathological fracture: Secondary | ICD-10-CM | POA: Diagnosis not present

## 2016-11-14 DIAGNOSIS — D123 Benign neoplasm of transverse colon: Secondary | ICD-10-CM | POA: Insufficient documentation

## 2016-11-14 DIAGNOSIS — F17210 Nicotine dependence, cigarettes, uncomplicated: Secondary | ICD-10-CM | POA: Insufficient documentation

## 2016-11-14 DIAGNOSIS — Z86010 Personal history of colonic polyps: Secondary | ICD-10-CM | POA: Insufficient documentation

## 2016-11-14 DIAGNOSIS — D124 Benign neoplasm of descending colon: Secondary | ICD-10-CM | POA: Diagnosis not present

## 2016-11-14 DIAGNOSIS — Z79899 Other long term (current) drug therapy: Secondary | ICD-10-CM | POA: Diagnosis not present

## 2016-11-14 DIAGNOSIS — K5140 Inflammatory polyps of colon without complications: Secondary | ICD-10-CM | POA: Diagnosis not present

## 2016-11-14 HISTORY — PX: POLYPECTOMY: SHX5525

## 2016-11-14 HISTORY — PX: COLONOSCOPY WITH PROPOFOL: SHX5780

## 2016-11-14 HISTORY — DX: Presence of external hearing-aid: Z97.4

## 2016-11-14 SURGERY — COLONOSCOPY WITH PROPOFOL
Anesthesia: Monitor Anesthesia Care | Wound class: Contaminated

## 2016-11-14 MED ORDER — LIDOCAINE HCL (CARDIAC) 20 MG/ML IV SOLN
INTRAVENOUS | Status: DC | PRN
  Administered 2016-11-14: 50 mg via INTRAVENOUS

## 2016-11-14 MED ORDER — PROPOFOL 10 MG/ML IV BOLUS
INTRAVENOUS | Status: DC | PRN
  Administered 2016-11-14: 20 mg via INTRAVENOUS
  Administered 2016-11-14: 100 mg via INTRAVENOUS
  Administered 2016-11-14: 20 mg via INTRAVENOUS
  Administered 2016-11-14 (×2): 50 mg via INTRAVENOUS

## 2016-11-14 MED ORDER — LACTATED RINGERS IV SOLN
INTRAVENOUS | Status: DC
  Administered 2016-11-14: 09:00:00 via INTRAVENOUS

## 2016-11-14 MED ORDER — STERILE WATER FOR IRRIGATION IR SOLN
Status: DC | PRN
  Administered 2016-11-14: 10:00:00

## 2016-11-14 SURGICAL SUPPLY — 23 items
CANISTER SUCT 1200ML W/VALVE (MISCELLANEOUS) ×4 IMPLANT
CLIP HMST 235XBRD CATH ROT (MISCELLANEOUS) ×2 IMPLANT
CLIP RESOLUTION 360 11X235 (MISCELLANEOUS) ×2
FCP ESCP3.2XJMB 240X2.8X (MISCELLANEOUS)
FORCEPS BIOP RAD 4 LRG CAP 4 (CUTTING FORCEPS) ×4 IMPLANT
FORCEPS BIOP RJ4 240 W/NDL (MISCELLANEOUS)
FORCEPS ESCP3.2XJMB 240X2.8X (MISCELLANEOUS) IMPLANT
GOWN CVR UNV OPN BCK APRN NK (MISCELLANEOUS) ×4 IMPLANT
GOWN ISOL THUMB LOOP REG UNIV (MISCELLANEOUS) ×4
INJECTOR VARIJECT VIN23 (MISCELLANEOUS) IMPLANT
KIT DEFENDO VALVE AND CONN (KITS) IMPLANT
KIT ENDO PROCEDURE OLY (KITS) ×4 IMPLANT
MARKER SPOT ENDO TATTOO 5ML (MISCELLANEOUS) IMPLANT
PAD GROUND ADULT SPLIT (MISCELLANEOUS) ×4 IMPLANT
PROBE APC STR FIRE (PROBE) IMPLANT
RETRIEVER NET ROTH 2.5X230 LF (MISCELLANEOUS) IMPLANT
SNARE SHORT THROW 13M SML OVAL (MISCELLANEOUS) ×4 IMPLANT
SNARE SHORT THROW 30M LRG OVAL (MISCELLANEOUS) IMPLANT
SNARE SNG USE RND 15MM (INSTRUMENTS) IMPLANT
SPOT EX ENDOSCOPIC TATTOO (MISCELLANEOUS)
TRAP ETRAP POLY (MISCELLANEOUS) ×4 IMPLANT
VARIJECT INJECTOR VIN23 (MISCELLANEOUS)
WATER STERILE IRR 250ML POUR (IV SOLUTION) ×4 IMPLANT

## 2016-11-14 NOTE — Op Note (Signed)
[facility] Gastroenterology Patient Name: Madison Franklin Procedure Date: 11/14/2016 9:30 AM MRN: [id number] Account #: [id number] Date of Birth: 05-26-1946 Admit Type: Outpatient Age: 71 Room: [facility] OR ROOM 01 Gender: Female Note Status: Finalized Procedure:            Colonoscopy Indications:          High risk colon cancer surveillance: Personal history                        of colonic polyps Providers:            [name] MD, MD Referring MD:         [name] (Referring MD) Medicines:            Propofol per Anesthesia Complications:        No immediate complications. Procedure:            Pre-Anesthesia Assessment:                       - Prior to the procedure, a History and Physical was                        performed, and patient medications and allergies were                        reviewed. The patient's tolerance of previous                        anesthesia was also reviewed. The risks and benefits of                        the procedure and the sedation options and risks were                        discussed with the patient. All questions were                        answered, and informed consent was obtained. Prior                        Anticoagulants: The patient has taken no previous                        anticoagulant or antiplatelet agents. ASA Grade                        Assessment: II - A patient with mild systemic disease.                        After reviewing the risks and benefits, the patient was                        deemed in satisfactory condition to undergo the                        procedure.                       After obtaining informed consent, the colonoscope was  passed under direct vision. Throughout the procedure,                        the patient's blood pressure, pulse, and oxygen                        saturations were monitored continuously. The Olympus                        Colonoscope  190 [id number]) was introduced through the                        anus and advanced to the the cecum, identified by                        appendiceal orifice and ileocecal valve. The                        colonoscopy was performed without difficulty. The                        patient tolerated the procedure well. The quality of                        the bowel preparation was poor. Findings:      The perianal and digital rectal examinations were normal.      A 10 mm polyp was found in the cecum. The polyp was pedunculated. The       polyp was removed with a hot snare. Resection and retrieval were       complete. To prevent bleeding post-intervention, one hemostatic clip was       successfully placed (MR conditional). There was no bleeding at the end       of the procedure.      Seven sessile polyps were found in the transverse colon. The polyps were       4 to 10 mm in size. These polyps were removed with a cold snare.       Resection and retrieval were complete.      A 6 mm polyp was found in the descending colon. The polyp was       pedunculated. The polyp was removed with a hot snare. Resection and       retrieval were complete.      Three sessile polyps were found in the sigmoid colon. The polyps were 5       to 6 mm in size. These polyps were removed with a cold snare. Resection       and retrieval were complete. Impression:           - Preparation of the colon was poor.                       - One 10 mm polyp in the cecum, removed with a hot                        snare. Resected and retrieved. Clip (MR conditional)                        was placed.                       -  Seven 4 to 10 mm polyps in the transverse colon,                        removed with a cold snare. Resected and retrieved.                       - One 6 mm polyp in the descending colon, removed with                        a hot snare. Resected and retrieved.                       - Three 5 to 6 mm polyps in  the sigmoid colon, removed                        with a cold snare. Resected and retrieved. Recommendation:       - Discharge patient to home.                       - Resume previous diet.                       - Continue present medications.                       - Await pathology results.                       - Repeat colonoscopy in 1 year because the bowel                        preparation was poor. Procedure Code(s):    --- Professional ---                       [id number], Colonoscopy, flexible; with removal of tumor(s),                        polyp(s), or other lesion(s) by snare technique Diagnosis Code(s):    --- Professional ---                       Z86.010, Personal history of colonic polyps                       D12.0, Benign neoplasm of cecum                       D12.4, Benign neoplasm of descending colon                       D12.3, Benign neoplasm of transverse colon (hepatic                        flexure or splenic flexure)                       D12.5, Benign neoplasm of sigmoid colon CPT copyright 2016 American Medical Association. All rights reserved. The codes documented in this report are preliminary and upon coder review may  be revised to meet current compliance requirements. [name] MD, MD 11/14/2016 10:18:15 AM This report has been signed electronically.  Number of Addenda: 0 Note Initiated On: 11/14/2016 9:30 AM Scope Withdrawal Time: 0 hours 11 minutes 36 seconds  Total Procedure Duration: 0 hours 22 minutes 53 seconds       [facility]

## 2016-11-14 NOTE — Anesthesia Procedure Notes (Signed)
Procedure Name: MAC Performed by: Laneisha Mino Pre-anesthesia Checklist: Patient identified, Emergency Drugs available, Suction available, Timeout performed and Patient being monitored Patient Re-evaluated:Patient Re-evaluated prior to inductionOxygen Delivery Method: Nasal cannula Placement Confirmation: positive ETCO2     

## 2016-11-14 NOTE — Transfer of Care (Signed)
Immediate Anesthesia Transfer of Care Note  Patient: Madison Franklin  Procedure(s) Performed: Procedure(s): COLONOSCOPY WITH PROPOFOL (N/A) POLYPECTOMY  Patient Location: PACU  Anesthesia Type: MAC  Level of Consciousness: awake, alert  and patient cooperative  Airway and Oxygen Therapy: Patient Spontanous Breathing and Patient connected to supplemental oxygen  Post-op Assessment: Post-op Vital signs reviewed, Patient's Cardiovascular Status Stable, Respiratory Function Stable, Patent Airway and No signs of Nausea or vomiting  Post-op Vital Signs: Reviewed and stable  Complications: No apparent anesthesia complications

## 2016-11-14 NOTE — H&P (Signed)
[name], MD [facility] [address] [place], [place] [address] Phone: [phone] Fax : [phone]  Primary Care Physician:  [name], NP Primary Gastroenterologist:  Dr. Allen Norris  Pre-Procedure History & Physical: HPI:  Madison Franklin is a 71 y.o. female is here for an colonoscopy.   Past Medical History:  Diagnosis Date  . COPD (chronic obstructive pulmonary disease) ([place])   . Hearing aid worn    bilateral  . Hyperlipidemia   . Hypertension   . Osteoporosis   . Personal history of tobacco use, presenting hazards to health 08/15/2015    Past Surgical History:  Procedure Laterality Date  . ABDOMINAL HYSTERECTOMY    . long nodule      Prior to Admission medications   Medication Sig Start Date End Date Taking? Authorizing Provider  albuterol (PROVENTIL HFA;VENTOLIN HFA) 108 (90 Base) MCG/ACT inhaler Inhale 2 puffs into the lungs every 6 (six) hours as needed for wheezing or shortness of breath. 09/29/16  Yes Volney American, PA-C  amLODipine (NORVASC) 5 MG tablet Take 1 tablet (5 mg total) by mouth daily. 09/29/16  Yes Volney American, PA-C  aspirin 81 MG tablet Take 81 mg by mouth daily.   Yes Historical Provider, MD  atenolol (TENORMIN) 50 MG tablet Take 1 tablet (50 mg total) by mouth daily. 09/29/16  Yes Volney American, PA-C  atorvastatin (LIPITOR) 10 MG tablet Take 1 tablet (10 mg total) by mouth daily. 09/29/16  Yes Volney American, PA-C  hydrochlorothiazide (MICROZIDE) 12.5 MG capsule Take 1 capsule (12.5 mg total) by mouth daily. 09/29/16  Yes Volney American, PA-C  lisinopril (PRINIVIL,ZESTRIL) 40 MG tablet Take 1 tablet (40 mg total) by mouth daily. 09/29/16  Yes Volney American, PA-C  mometasone-formoterol (DULERA) 200-5 MCG/ACT AERO Inhale 2 puffs into the lungs 2 (two) times daily. 09/29/16  Yes Volney American, PA-C  budesonide-formoterol [facility]) 160-4.5 MCG/ACT inhaler Inhale 2 puffs into the lungs 2 (two)  times daily. Patient not taking: Reported on 11/07/2016 02/01/16   [name], NP    Allergies as of 10/31/2016  . (No Known Allergies)    History reviewed. No pertinent family history.  Social History   Social History  . Marital status: Married    Spouse name: N/A  . Number of children: N/A  . Years of education: N/A   Occupational History  . Not on file.   Social History Main Topics  . Smoking status: Current Every Day Smoker    Packs/day: 0.25    Years: 30.00    Types: Cigarettes  . Smokeless tobacco: Never Used  . Alcohol use No  . Drug use: No  . Sexual activity: Yes   Other Topics Concern  . Not on file   Social History Narrative  . No narrative on file    Review of Systems: See HPI, otherwise negative ROS  Physical Exam: BP (!) 155/80   Pulse (!) 58   Temp 97 F (36.1 C) (Temporal)   Resp 16   Ht 4\' 11"  (1.499 m)   Wt 153 lb (69.4 kg)   LMP  (LMP Unknown)   SpO2 98%   BMI 30.90 kg/m  General:   Alert,  pleasant and cooperative in NAD Head:  Normocephalic and atraumatic. Neck:  Supple; no masses or thyromegaly. Lungs:  Clear throughout to auscultation.    Heart:  Regular rate and rhythm. Abdomen:  Soft, nontender and nondistended. Normal bowel sounds, without guarding, and without rebound.  Neurologic:  Alert and  oriented x4;  grossly normal neurologically.  Impression/Plan: Madison Franklin is here for an colonoscopy to be performed for histroy of polyps  Risks, benefits, limitations, and alternatives regarding  colonoscopy have been reviewed with the patient.  Questions have been answered.  All parties agreeable.   [name], MD  11/14/2016, 9:29 AM

## 2016-11-14 NOTE — Anesthesia Preprocedure Evaluation (Addendum)
Anesthesia Evaluation  Patient identified by MRN, date of birth, ID band Patient awake    Reviewed: Allergy & Precautions, H&P , NPO status , Patient's Chart, lab work & pertinent test results, reviewed documented beta blocker date and time   Airway Mallampati: II  TM Distance: >3 FB Neck ROM: full    Dental  (+) Poor Dentition, Edentulous Upper   Pulmonary sleep apnea (probable per husband, not yet tested) , COPD, Current Smoker,    Pulmonary exam normal breath sounds clear to auscultation       Cardiovascular Exercise Tolerance: Good hypertension,  Rhythm:regular Rate:Normal     Neuro/Psych Bilateral hearing aides negative psych ROS   GI/Hepatic negative GI ROS, Neg liver ROS,   Endo/Other  negative endocrine ROS  Renal/GU negative Renal ROS  negative genitourinary   Musculoskeletal   Abdominal   Peds  Hematology negative hematology ROS (+)   Anesthesia Other Findings   Reproductive/Obstetrics negative OB ROS                            Anesthesia Physical Anesthesia Plan  ASA: III  Anesthesia Plan: MAC   Post-op Pain Management:    Induction:   Airway Management Planned:   Additional Equipment:   Intra-op Plan:   Post-operative Plan:   Informed Consent: I have reviewed the patients History and Physical, chart, labs and discussed the procedure including the risks, benefits and alternatives for the proposed anesthesia with the patient or authorized representative who has indicated his/her understanding and acceptance.   Dental Advisory Given  Plan Discussed with: CRNA  Anesthesia Plan Comments:         Anesthesia Quick Evaluation

## 2016-11-14 NOTE — Anesthesia Postprocedure Evaluation (Signed)
Anesthesia Post Note  Patient: Madison Franklin  Procedure(s) Performed: Procedure(s) (LRB): COLONOSCOPY WITH PROPOFOL (N/A) POLYPECTOMY  Patient location during evaluation: PACU Anesthesia Type: MAC Level of consciousness: awake and alert Pain management: pain level controlled Vital Signs Assessment: post-procedure vital signs reviewed and stable Respiratory status: spontaneous breathing, nonlabored ventilation, respiratory function stable and patient connected to nasal cannula oxygen Cardiovascular status: stable and blood pressure returned to baseline Anesthetic complications: no    Alisa [place]ff

## 2016-11-17 ENCOUNTER — Encounter: Payer: Self-pay | Admitting: Gastroenterology

## 2016-11-18 ENCOUNTER — Encounter: Payer: Self-pay | Admitting: Gastroenterology

## 2016-11-19 ENCOUNTER — Encounter: Payer: Self-pay | Admitting: Gastroenterology

## 2017-01-05 ENCOUNTER — Encounter: Payer: Self-pay | Admitting: Unknown Physician Specialty

## 2017-01-05 ENCOUNTER — Ambulatory Visit (INDEPENDENT_AMBULATORY_CARE_PROVIDER_SITE_OTHER): Payer: Medicare Other | Admitting: Unknown Physician Specialty

## 2017-01-05 VITALS — BP 138/60 | HR 58 | Temp 98.0°F | Wt 154.8 lb

## 2017-01-05 DIAGNOSIS — F17200 Nicotine dependence, unspecified, uncomplicated: Secondary | ICD-10-CM | POA: Diagnosis not present

## 2017-01-05 DIAGNOSIS — Z7189 Other specified counseling: Secondary | ICD-10-CM

## 2017-01-05 DIAGNOSIS — J449 Chronic obstructive pulmonary disease, unspecified: Secondary | ICD-10-CM

## 2017-01-05 DIAGNOSIS — G4733 Obstructive sleep apnea (adult) (pediatric): Secondary | ICD-10-CM | POA: Diagnosis not present

## 2017-01-05 DIAGNOSIS — I10 Essential (primary) hypertension: Secondary | ICD-10-CM | POA: Diagnosis not present

## 2017-01-05 MED ORDER — BUDESONIDE-FORMOTEROL FUMARATE 160-4.5 MCG/ACT IN AERO: 2.0000 | INHALATION_SPRAY | Freq: Two times a day (BID) | RESPIRATORY_TRACT | 3 refills | Status: DC

## 2017-01-05 MED ORDER — LISINOPRIL 40 MG PO TABS: 40.0000 mg | ORAL_TABLET | Freq: Every day | ORAL | 1 refills | Status: DC

## 2017-01-05 MED ORDER — HYDROCHLOROTHIAZIDE 12.5 MG PO CAPS: 12.5000 mg | ORAL_CAPSULE | Freq: Every day | ORAL | 1 refills | Status: DC

## 2017-01-05 MED ORDER — VARENICLINE TARTRATE 0.5 MG PO TABS: 0.5000 mg | ORAL_TABLET | Freq: Two times a day (BID) | ORAL | 0 refills | Status: DC

## 2017-01-05 MED ORDER — AMLODIPINE BESYLATE 5 MG PO TABS: 5.0000 mg | ORAL_TABLET | Freq: Every day | ORAL | 1 refills | Status: DC

## 2017-01-05 MED ORDER — ATORVASTATIN CALCIUM 10 MG PO TABS: 10.0000 mg | ORAL_TABLET | Freq: Every day | ORAL | 1 refills | Status: DC

## 2017-01-05 MED ORDER — VARENICLINE TARTRATE 1 MG PO TABS: 1.0000 mg | ORAL_TABLET | Freq: Two times a day (BID) | ORAL | 6 refills | Status: DC

## 2017-01-05 NOTE — Assessment & Plan Note (Signed)
Emphasized inhaler use.  Symbicort daily.  Refer to pulmonary for further management as already using an O2 tank at times

## 2017-01-05 NOTE — Assessment & Plan Note (Signed)
Bp improved on recheck to 138/60 and SBP of 120 at home.  Will DC Atenolol due to respiratory risks.

## 2017-01-05 NOTE — Assessment & Plan Note (Addendum)
Rx for Chantix.  Pt ed on quitting

## 2017-01-05 NOTE — Assessment & Plan Note (Signed)
A voluntary discussion about advance care planning including the explanation and discussion of advance directives was extensively discussed  with the patient.  Explanation about the health care proxy and Living will was reviewed and packet with forms with explanation of how to fill them out was given.  During this discussion, the patient was able to identify a health care proxy as her daughter Levada Dy  and plans to fill out the paperwork required.  Patient was offered a separate [place] visit for further assistance with forms.

## 2017-01-05 NOTE — Progress Notes (Signed)
BP 138/60 (BP Location: Left Arm, Cuff Size: Normal)   Pulse (!) 58   Temp 98 F (36.7 C)   Wt 154 lb 12.8 oz (70.2 kg)   LMP  (LMP Unknown)   SpO2 96%   BMI 31.27 kg/m    Subjective:    Patient ID: Madison Franklin, female    DOB: [birth year], 71 y.o.   MRN: [id number]  HPI: Madison Franklin is a 71 y.o. female  Chief Complaint  Patient presents with  . COPD    3 month f/up  . Hypertension    3 month f/up   Pt is here with her daughter who gives much of her history.  It turns out she is using husbands CPAP at night and she states she uses her husband's O2 tank.  During the day.    COPD Very SOB with small distances.  Not using inhalers most of the time.   Night time symptoms: Yes! Increased SOB:Yes! Using O2:Using husband's  She feels if she wanted to quit, she could.  She would like to try Chantix She snores at night  Hypertension Using medications without difficulty Average home BPs 120/70 at home   No problems or lightheadedness No chest pain with exertion or shortness of breath No Edema  Relevant past medical, surgical, family and social history reviewed and updated as indicated. Interim medical history since our last visit reviewed. Allergies and medications reviewed and updated.  Review of Systems  Per HPI unless specifically indicated above     Objective:    BP 138/60 (BP Location: Left Arm, Cuff Size: Normal)   Pulse (!) 58   Temp 98 F (36.7 C)   Wt 154 lb 12.8 oz (70.2 kg)   LMP  (LMP Unknown)   SpO2 96%   BMI 31.27 kg/m   Wt Readings from Last 3 Encounters:  01/05/17 154 lb 12.8 oz (70.2 kg)  11/14/16 153 lb (69.4 kg)  09/29/16 152 lb (68.9 kg)    Physical Exam  Constitutional: She is oriented to person, place, and time. She appears well-developed and well-nourished. No distress.  HENT:  Head: Normocephalic and atraumatic.  Eyes: Conjunctivae and lids are normal. Right eye exhibits no discharge. Left eye exhibits no discharge. No scleral  icterus.  Neck: Normal range of motion. Neck supple. No JVD present. Carotid bruit is not present.  Cardiovascular: Normal rate, regular rhythm and normal heart sounds.   Pulmonary/Chest: Effort normal and breath sounds normal.  Abdominal: Normal appearance. There is no splenomegaly or hepatomegaly.  Musculoskeletal: Normal range of motion.  Neurological: She is alert and oriented to person, place, and time.  Skin: Skin is warm, dry and intact. No rash noted. No pallor.  Psychiatric: She has a normal mood and affect. Her behavior is normal. Judgment and thought content normal.   Labs reviewed and are within normals  Results for orders placed or performed in visit on 09/29/16  Microscopic Examination  Result Value Ref Range   WBC, UA 0-5 0 - 5 /hpf   RBC, UA None seen 0 - 2 /hpf   Epithelial Cells (non renal) 0-10 0 - 10 /hpf   Bacteria, UA None seen None seen/Few  CBC with Differential/Platelet  Result Value Ref Range   WBC 10.5 3.4 - 10.8 x10E3/uL   RBC 4.87 3.77 - 5.28 x10E6/uL   Hemoglobin 12.7 11.1 - 15.9 g/dL   Hematocrit 40.6 34.0 - 46.6 %   MCV 83 79 - 97 fL  MCH 26.1 (L) 26.6 - 33.0 pg   MCHC 31.3 (L) 31.5 - 35.7 g/dL   RDW 14.4 12.3 - 15.4 %   Platelets 201 150 - 379 x10E3/uL   Neutrophils 68 Not Estab. %   Lymphs 21 Not Estab. %   Monocytes 9 Not Estab. %   Eos 1 Not Estab. %   Basos 1 Not Estab. %   Neutrophils Absolute 7.1 (H) 1.4 - 7.0 x10E3/uL   Lymphocytes Absolute 2.3 0.7 - 3.1 x10E3/uL   Monocytes Absolute 1.0 (H) 0.1 - 0.9 x10E3/uL   EOS (ABSOLUTE) 0.1 0.0 - 0.4 x10E3/uL   Basophils Absolute 0.1 0.0 - 0.2 x10E3/uL   Immature Granulocytes 0 Not Estab. %   Immature Grans (Abs) 0.0 0.0 - 0.1 x10E3/uL  Comprehensive metabolic panel  Result Value Ref Range   Glucose 79 65 - 99 mg/dL   BUN 20 8 - 27 mg/dL   Creatinine, Ser 1.01 (H) 0.57 - 1.00 mg/dL   GFR calc non Af Amer 56 (L) >59 mL/min/1.73   GFR calc Af Amer 65 >59 mL/min/1.73   BUN/Creatinine Ratio  20 12 - 28   Sodium 135 134 - 144 mmol/L   Potassium 4.5 3.5 - 5.2 mmol/L   Chloride 97 96 - 106 mmol/L   CO2 23 18 - 29 mmol/L   Calcium 10.0 8.7 - 10.3 mg/dL   Total Protein 7.7 6.0 - 8.5 g/dL   Albumin 4.5 3.5 - 4.8 g/dL   Globulin, Total 3.2 1.5 - 4.5 g/dL   Albumin/Globulin Ratio 1.4 1.2 - 2.2   Bilirubin Total 0.5 0.0 - 1.2 mg/dL   Alkaline Phosphatase 91 39 - 117 IU/L   AST 15 0 - 40 IU/L   ALT 7 0 - 32 IU/L  Lipid Panel w/o Chol/HDL Ratio  Result Value Ref Range   Cholesterol, Total 184 100 - 199 mg/dL   Triglycerides 200 (H) 0 - 149 mg/dL   HDL 52 >39 mg/dL   VLDL Cholesterol Cal 40 5 - 40 mg/dL   LDL Calculated 92 0 - 99 mg/dL  UA/M w/rflx Culture, Routine  Result Value Ref Range   Specific Gravity, UA 1.015 1.005 - 1.030   pH, UA 5.5 5.0 - 7.5   Color, UA Yellow Yellow   Appearance Ur Clear Clear   Leukocytes, UA Negative Negative   Protein, UA Negative Negative/Trace   Glucose, UA Negative Negative   Ketones, UA Negative Negative   RBC, UA Trace (A) Negative   Bilirubin, UA Negative Negative   Urobilinogen, Ur 0.2 0.2 - 1.0 mg/dL   Nitrite, UA Negative Negative   Microscopic Examination See below:   VITAMIN D 25 Hydroxy (Vit-D Deficiency, Fractures)  Result Value Ref Range   Vit D, 25-Hydroxy 26.3 (L) 30.0 - 100.0 ng/mL      Assessment & Plan:   Problem List Items Addressed This Visit      Unprioritized   Advanced care planning/counseling discussion    A voluntary discussion about advance care planning including the explanation and discussion of advance directives was extensively discussed  with the patient.  Explanation about the health care proxy and Living will was reviewed and packet with forms with explanation of how to fill them out was given.  During this discussion, the patient was able to identify a health care proxy as her daughter Levada Dy  and plans to fill out the paperwork required.  Patient was offered a separate [place] visit for  further assistance  with forms.         COPD, severe ([place]) - Primary    Emphasized inhaler use.  Symbicort daily.  Refer to pulmonary for further management as already using an O2 tank at times      Relevant Medications   varenicline (CHANTIX) 0.5 MG tablet   varenicline (CHANTIX CONTINUING MONTH PAK) 1 MG tablet   budesonide-formoterol (SYMBICORT) 160-4.5 MCG/ACT inhaler   Other Relevant Orders   Ambulatory referral to Pulmonology   Hypertension    Bp improved on recheck to 138/60 and SBP of 120 at home.  Will DC Atenolol due to respiratory risks.        Relevant Medications   lisinopril (PRINIVIL,ZESTRIL) 40 MG tablet   amLODipine (NORVASC) 5 MG tablet   hydrochlorothiazide (MICROZIDE) 12.5 MG capsule   atorvastatin (LIPITOR) 10 MG tablet   Tobacco use disorder    Rx for Chantix.  Pt ed on quitting      Relevant Medications   amLODipine (NORVASC) 5 MG tablet   hydrochlorothiazide (MICROZIDE) 12.5 MG capsule    Other Visit Diagnoses    Obstructive sleep apnea syndrome       Relevant Orders   Ambulatory referral to Sleep Studies       Follow up plan: Return in about 3 months (around 04/07/2017).

## 2017-02-12 ENCOUNTER — Institutional Professional Consult (permissible substitution): Payer: Medicare Other | Admitting: Internal Medicine

## 2017-02-12 ENCOUNTER — Encounter: Payer: Self-pay | Admitting: Internal Medicine

## 2017-02-25 ENCOUNTER — Telehealth: Payer: Self-pay | Admitting: *Deleted

## 2017-02-25 NOTE — Telephone Encounter (Signed)
Left message for patient to notify them that it is time to schedule annual low dose lung cancer screening CT scan. Instructed patient to call back to verify information prior to the scan being scheduled.  

## 2017-02-27 ENCOUNTER — Telehealth: Payer: Self-pay | Admitting: *Deleted

## 2017-02-27 DIAGNOSIS — R9389 Abnormal findings on diagnostic imaging of other specified body structures: Secondary | ICD-10-CM

## 2017-02-27 NOTE — Telephone Encounter (Signed)
Notified patient that annual lung cancer screening low dose CT scan is due currently or will be in near future. Confirmed that patient is within the age range of 55-77, and asymptomatic, (no signs or symptoms of lung cancer). Patient denies illness that would prevent curative treatment for lung cancer if found. Verified smoking history, (current, 30.75pack year). The shared decision making visit was done 08/17/15. Patient is agreeable for CT scan being scheduled.

## 2017-03-13 ENCOUNTER — Ambulatory Visit
Admission: RE | Admit: 2017-03-13 | Discharge: 2017-03-13 | Disposition: A | Discharge status: Home / Self Care | Payer: Medicare Other | Source: Ambulatory Visit | Attending: Oncology | Admitting: Oncology

## 2017-03-13 DIAGNOSIS — R938 Abnormal findings on diagnostic imaging of other specified body structures: Secondary | ICD-10-CM | POA: Insufficient documentation

## 2017-03-13 DIAGNOSIS — I700 Atherosclerosis of aorta: Secondary | ICD-10-CM | POA: Insufficient documentation

## 2017-03-13 DIAGNOSIS — I2510 Atherosclerotic heart disease of native coronary artery without angina pectoris: Secondary | ICD-10-CM | POA: Insufficient documentation

## 2017-03-13 DIAGNOSIS — E042 Nontoxic multinodular goiter: Secondary | ICD-10-CM | POA: Insufficient documentation

## 2017-03-13 DIAGNOSIS — J432 Centrilobular emphysema: Secondary | ICD-10-CM | POA: Diagnosis not present

## 2017-03-13 DIAGNOSIS — R918 Other nonspecific abnormal finding of lung field: Secondary | ICD-10-CM | POA: Diagnosis not present

## 2017-03-13 DIAGNOSIS — R9389 Abnormal findings on diagnostic imaging of other specified body structures: Secondary | ICD-10-CM

## 2017-03-19 ENCOUNTER — Encounter: Payer: Self-pay | Admitting: *Deleted

## 2017-04-10 ENCOUNTER — Ambulatory Visit: Payer: Medicare Other | Admitting: Unknown Physician Specialty

## 2017-04-13 ENCOUNTER — Encounter: Payer: Self-pay | Admitting: Unknown Physician Specialty

## 2017-04-13 ENCOUNTER — Ambulatory Visit (INDEPENDENT_AMBULATORY_CARE_PROVIDER_SITE_OTHER): Payer: Medicare Other | Admitting: Unknown Physician Specialty

## 2017-04-13 VITALS — BP 153/72 | HR 96 | Temp 98.4°F | Ht 60.3 in | Wt 156.1 lb

## 2017-04-13 DIAGNOSIS — N183 Chronic kidney disease, stage 3 unspecified: Secondary | ICD-10-CM

## 2017-04-13 DIAGNOSIS — Z1231 Encounter for screening mammogram for malignant neoplasm of breast: Secondary | ICD-10-CM

## 2017-04-13 DIAGNOSIS — E782 Mixed hyperlipidemia: Secondary | ICD-10-CM

## 2017-04-13 DIAGNOSIS — J449 Chronic obstructive pulmonary disease, unspecified: Secondary | ICD-10-CM

## 2017-04-13 DIAGNOSIS — I10 Essential (primary) hypertension: Secondary | ICD-10-CM | POA: Diagnosis not present

## 2017-04-13 DIAGNOSIS — R011 Cardiac murmur, unspecified: Secondary | ICD-10-CM

## 2017-04-13 NOTE — Assessment & Plan Note (Signed)
Stable, continue present medications.   

## 2017-04-13 NOTE — Patient Instructions (Signed)
Please do call to schedule your mammogram; the number to schedule one at either [facility] or [facility] is [phone]   

## 2017-04-13 NOTE — Assessment & Plan Note (Addendum)
Stable.  Unable to afford Symbicort.  She did quit smoking and using Albuterol BID

## 2017-04-13 NOTE — Assessment & Plan Note (Signed)
Early systolic.  Refer to cardiology for further evaluation

## 2017-04-13 NOTE — Progress Notes (Signed)
BP (!) 153/72   Pulse 96   Temp 98.4 F (36.9 C)   Ht 5' 0.3" (1.532 m)   Wt 156 lb 1.6 oz (70.8 kg)   LMP  (LMP Unknown)   SpO2 97%   BMI 30.18 kg/m    Subjective:    Patient ID: Madison Franklin, female    DOB: 10-Nov-1945, 71 y.o.   MRN: [id number]  HPI: Madison Franklin is a 71 y.o. female  Chief Complaint  Patient presents with  . Hyperlipidemia  . Hypertension  . COPD   COPD Not using the Symbicort due to cost.  Using Albuterol about twice a day.  OK around the house and used it when she goes out.  No change in symptoms.  Quit smoking 3 months ago.    Hypertension Using medications without difficulty Average home BPs 134/65 this AM and checks it daily  No problems or lightheadedness No chest pain with exertion or shortness of breath No Edema   Hyperlipidemia Using medications without problems: No Muscle aches   Social History   Social History  . Marital status: Married    Spouse name: N/A  . Number of children: N/A  . Years of education: N/A   Occupational History  . Not on file.   Social History Main Topics  . Smoking status: Former Smoker    Packs/day: 0.25    Years: 30.00    Types: Cigarettes    Quit date: 01/17/2017  . Smokeless tobacco: Never Used  . Alcohol use No  . Drug use: No  . Sexual activity: Yes   Other Topics Concern  . Not on file   Social History Narrative  . No narrative on file   Past Medical History:  Diagnosis Date  . COPD (chronic obstructive pulmonary disease) ([place])   . Hearing aid worn    bilateral  . Hyperlipidemia   . Hypertension   . Osteoporosis   . Personal history of tobacco use, presenting hazards to health 08/15/2015   Past Surgical History:  Procedure Laterality Date  . ABDOMINAL HYSTERECTOMY    . COLONOSCOPY WITH PROPOFOL N/A 11/14/2016   Procedure: COLONOSCOPY WITH PROPOFOL;  Surgeon: [name], MD;  Location: [facility];  Service: Endoscopy;  Laterality: N/A;  . long nodule    . POLYPECTOMY   11/14/2016   Procedure: POLYPECTOMY;  Surgeon: [name], MD;  [facility];  Service: Endoscopy;;     Relevant past medical, surgical, family and social history reviewed and updated as indicated. Interim medical history since our last visit reviewed. Allergies and medications reviewed and updated.  Review of Systems  Per HPI unless specifically indicated above     Objective:    BP (!) 153/72   Pulse 96   Temp 98.4 F (36.9 C)   Ht 5' 0.3" (1.532 m)   Wt 156 lb 1.6 oz (70.8 kg)   LMP  (LMP Unknown)   SpO2 97%   BMI 30.18 kg/m   Wt Readings from Last 3 Encounters:  04/13/17 156 lb 1.6 oz (70.8 kg)  01/05/17 154 lb 12.8 oz (70.2 kg)  11/14/16 153 lb (69.4 kg)    Physical Exam  Constitutional: She is oriented to person, place, and time. She appears well-developed and well-nourished. No distress.  HENT:  Head: Normocephalic and atraumatic.  Eyes: Conjunctivae and lids are normal. Right eye exhibits no discharge. Left eye exhibits no discharge. No scleral icterus.  Neck: Normal range of motion. Neck supple.  No JVD present. Carotid bruit is not present.  Cardiovascular: Normal rate and regular rhythm.   Murmur heard.  Systolic murmur is present with a grade of 3/6  Pulmonary/Chest: Effort normal and breath sounds normal.  Abdominal: Normal appearance. There is no splenomegaly or hepatomegaly.  Musculoskeletal: Normal range of motion.  Neurological: She is alert and oriented to person, place, and time.  Skin: Skin is warm, dry and intact. No rash noted. No pallor.  Psychiatric: She has a normal mood and affect. Her behavior is normal. Judgment and thought content normal.    Results for orders placed or performed in visit on 09/29/16  Microscopic Examination  Result Value Ref Range   WBC, UA 0-5 0 - 5 /hpf   RBC, UA None seen 0 - 2 /hpf   Epithelial Cells (non renal) 0-10 0 - 10 /hpf   Bacteria, UA None seen None seen/Few  CBC with Differential/Platelet   Result Value Ref Range   WBC 10.5 3.4 - 10.8 x10E3/uL   RBC 4.87 3.77 - 5.28 x10E6/uL   Hemoglobin 12.7 11.1 - 15.9 g/dL   Hematocrit 40.6 34.0 - 46.6 %   MCV 83 79 - 97 fL   MCH 26.1 (L) 26.6 - 33.0 pg   MCHC 31.3 (L) 31.5 - 35.7 g/dL   RDW 14.4 12.3 - 15.4 %   Platelets 201 150 - 379 x10E3/uL   Neutrophils 68 Not Estab. %   Lymphs 21 Not Estab. %   Monocytes 9 Not Estab. %   Eos 1 Not Estab. %   Basos 1 Not Estab. %   Neutrophils Absolute 7.1 (H) 1.4 - 7.0 x10E3/uL   Lymphocytes Absolute 2.3 0.7 - 3.1 x10E3/uL   Monocytes Absolute 1.0 (H) 0.1 - 0.9 x10E3/uL   EOS (ABSOLUTE) 0.1 0.0 - 0.4 x10E3/uL   Basophils Absolute 0.1 0.0 - 0.2 x10E3/uL   Immature Granulocytes 0 Not Estab. %   Immature Grans (Abs) 0.0 0.0 - 0.1 x10E3/uL  Comprehensive metabolic panel  Result Value Ref Range   Glucose 79 65 - 99 mg/dL   BUN 20 8 - 27 mg/dL   Creatinine, Ser 1.01 (H) 0.57 - 1.00 mg/dL   GFR calc non Af Amer 56 (L) >59 mL/min/1.73   GFR calc Af Amer 65 >59 mL/min/1.73   BUN/Creatinine Ratio 20 12 - 28   Sodium 135 134 - 144 mmol/L   Potassium 4.5 3.5 - 5.2 mmol/L   Chloride 97 96 - 106 mmol/L   CO2 23 18 - 29 mmol/L   Calcium 10.0 8.7 - 10.3 mg/dL   Total Protein 7.7 6.0 - 8.5 g/dL   Albumin 4.5 3.5 - 4.8 g/dL   Globulin, Total 3.2 1.5 - 4.5 g/dL   Albumin/Globulin Ratio 1.4 1.2 - 2.2   Bilirubin Total 0.5 0.0 - 1.2 mg/dL   Alkaline Phosphatase 91 39 - 117 IU/L   AST 15 0 - 40 IU/L   ALT 7 0 - 32 IU/L  Lipid Panel w/o Chol/HDL Ratio  Result Value Ref Range   Cholesterol, Total 184 100 - 199 mg/dL   Triglycerides 200 (H) 0 - 149 mg/dL   HDL 52 >39 mg/dL   VLDL Cholesterol Cal 40 5 - 40 mg/dL   LDL Calculated 92 0 - 99 mg/dL  UA/M w/rflx Culture, Routine  Result Value Ref Range   Specific Gravity, UA 1.015 1.005 - 1.030   pH, UA 5.5 5.0 - 7.5   Color, UA Yellow Yellow   Appearance  Ur Clear Clear   Leukocytes, UA Negative Negative   Protein, UA Negative Negative/Trace    Glucose, UA Negative Negative   Ketones, UA Negative Negative   RBC, UA Trace (A) Negative   Bilirubin, UA Negative Negative   Urobilinogen, Ur 0.2 0.2 - 1.0 mg/dL   Nitrite, UA Negative Negative   Microscopic Examination See below:   VITAMIN D 25 Hydroxy (Vit-D Deficiency, Fractures)  Result Value Ref Range   Vit D, 25-Hydroxy 26.3 (L) 30.0 - 100.0 ng/mL      Assessment & Plan:   Problem List Items Addressed This Visit      Unprioritized   Chronic kidney disease, stage 3   Relevant Orders   CBC with Differential/Platelet   COPD, severe ([facility])    Stable.  Unable to afford Symbicort.  She did quit smoking and using Albuterol BID      Encounter for screening mammogram for breast cancer   Relevant Orders   MM DIGITAL SCREENING BILATERAL   Heart murmur previously undiagnosed    Early systolic.  Refer to cardiology for further evaluation      Relevant Orders   Ambulatory referral to Cardiology   Hyperlipidemia - Primary   Relevant Orders   Lipid Panel w/o Chol/HDL Ratio   Hypertension    Stable, continue present medications.        Relevant Orders   Comprehensive metabolic panel       Follow up plan: No Follow-up on file.

## 2017-04-14 ENCOUNTER — Telehealth: Payer: Self-pay | Admitting: Unknown Physician Specialty

## 2017-04-14 DIAGNOSIS — N183 Chronic kidney disease, stage 3 unspecified: Secondary | ICD-10-CM

## 2017-04-14 LAB — CBC WITH DIFFERENTIAL/PLATELET
BASOS ABS: 0.1 10*3/uL (ref 0.0–0.2)
Basos: 1 %
EOS (ABSOLUTE): 0.1 10*3/uL (ref 0.0–0.4)
EOS: 1 %
HEMATOCRIT: 36.3 % (ref 34.0–46.6)
HEMOGLOBIN: 10.8 g/dL — AB (ref 11.1–15.9)
IMMATURE GRANS (ABS): 0 10*3/uL (ref 0.0–0.1)
Immature Granulocytes: 0 %
LYMPHS ABS: 1.8 10*3/uL (ref 0.7–3.1)
LYMPHS: 20 %
MCH: 25.5 pg — AB (ref 26.6–33.0)
MCHC: 29.8 g/dL — AB (ref 31.5–35.7)
MCV: 86 fL (ref 79–97)
MONOCYTES: 6 %
Monocytes Absolute: 0.5 10*3/uL (ref 0.1–0.9)
NEUTROS ABS: 6.7 10*3/uL (ref 1.4–7.0)
Neutrophils: 72 %
Platelets: 222 10*3/uL (ref 150–379)
RBC: 4.23 x10E6/uL (ref 3.77–5.28)
RDW: 14.3 % (ref 12.3–15.4)
WBC: 9.3 10*3/uL (ref 3.4–10.8)

## 2017-04-14 LAB — COMPREHENSIVE METABOLIC PANEL
A/G RATIO: 1.4 (ref 1.2–2.2)
ALT: 9 IU/L (ref 0–32)
AST: 19 IU/L (ref 0–40)
Albumin: 4.4 g/dL (ref 3.5–4.8)
Alkaline Phosphatase: 88 IU/L (ref 39–117)
BUN/Creatinine Ratio: 23 (ref 12–28)
BUN: 30 mg/dL — ABNORMAL HIGH (ref 8–27)
Bilirubin Total: 0.3 mg/dL (ref 0.0–1.2)
CALCIUM: 9.4 mg/dL (ref 8.7–10.3)
CO2: 17 mmol/L — ABNORMAL LOW (ref 20–29)
Chloride: 101 mmol/L (ref 96–106)
Creatinine, Ser: 1.33 mg/dL — ABNORMAL HIGH (ref 0.57–1.00)
GFR, EST AFRICAN AMERICAN: 46 mL/min/{1.73_m2} — AB (ref >59)
GFR, EST NON AFRICAN AMERICAN: 40 mL/min/{1.73_m2} — AB (ref >59)
Globulin, Total: 3.1 g/dL (ref 1.5–4.5)
Glucose: 86 mg/dL (ref 65–99)
POTASSIUM: 5.2 mmol/L (ref 3.5–5.2)
SODIUM: 135 mmol/L (ref 134–144)
TOTAL PROTEIN: 7.5 g/dL (ref 6.0–8.5)

## 2017-04-14 LAB — LIPID PANEL W/O CHOL/HDL RATIO
CHOLESTEROL TOTAL: 198 mg/dL (ref 100–199)
HDL: 62 mg/dL (ref >39)
LDL Calculated: 108 mg/dL — ABNORMAL HIGH (ref 0–99)
TRIGLYCERIDES: 139 mg/dL (ref 0–149)
VLDL Cholesterol Cal: 28 mg/dL (ref 5–40)

## 2017-04-14 NOTE — Telephone Encounter (Signed)
Discussed with pt about low GFR.  Recheck labs after drinking lots of fluids

## 2017-04-18 DIAGNOSIS — G4730 Sleep apnea, unspecified: Secondary | ICD-10-CM | POA: Diagnosis not present

## 2017-05-01 ENCOUNTER — Telehealth: Payer: Self-pay | Admitting: Unknown Physician Specialty

## 2017-05-01 MED ORDER — ZOLPIDEM TARTRATE 5 MG PO TABS: 5.0000 mg | ORAL_TABLET | Freq: Every evening | ORAL | 0 refills | Status: DC | PRN

## 2017-05-01 NOTE — Telephone Encounter (Signed)
Called and let patient's daughter know that a prescription was written and would be faxed to [facility].

## 2017-05-01 NOTE — Telephone Encounter (Signed)
It can be faxed or picked up

## 2017-05-01 NOTE — Telephone Encounter (Signed)
Patient's daughter called due to patient having a sleep study Monday, 05/04/2017 at 8:30 am. Sleep study is asking for a sleep aid for patient.  Daughter would like to know if medication would be sent to pharmacy or needs to be picked up or if it will be sent to the sleep study center.  Patient's daughter would like to be notified when medication is filled.  Please Advise.  Thank you

## 2017-05-01 NOTE — Telephone Encounter (Signed)
Routing to provider  

## 2017-05-04 DIAGNOSIS — G4730 Sleep apnea, unspecified: Secondary | ICD-10-CM | POA: Diagnosis not present

## 2017-05-11 ENCOUNTER — Encounter: Payer: Self-pay | Admitting: Pulmonary Disease

## 2017-05-11 ENCOUNTER — Ambulatory Visit (INDEPENDENT_AMBULATORY_CARE_PROVIDER_SITE_OTHER): Payer: Medicare Other | Admitting: Pulmonary Disease

## 2017-05-11 VITALS — BP 154/70 | HR 103 | Resp 16 | Ht 60.3 in | Wt 154.0 lb

## 2017-05-11 DIAGNOSIS — J432 Centrilobular emphysema: Secondary | ICD-10-CM | POA: Diagnosis not present

## 2017-05-11 DIAGNOSIS — Z87891 Personal history of nicotine dependence: Secondary | ICD-10-CM

## 2017-05-11 DIAGNOSIS — R0609 Other forms of dyspnea: Secondary | ICD-10-CM | POA: Diagnosis not present

## 2017-05-11 DIAGNOSIS — R911 Solitary pulmonary nodule: Secondary | ICD-10-CM

## 2017-05-11 DIAGNOSIS — R011 Cardiac murmur, unspecified: Secondary | ICD-10-CM | POA: Diagnosis not present

## 2017-05-11 DIAGNOSIS — M419 Scoliosis, unspecified: Secondary | ICD-10-CM | POA: Diagnosis not present

## 2017-05-11 NOTE — Progress Notes (Signed)
PULMONARY CONSULT NOTE  Requesting MD/Service: [name], NP Date of initial consultation: 05/11/17 Reason for consultation: Former smoker, exertional dyspnea  PT PROFILE: 71 y.o. female former (light) smoker referred for evaluation of exertional dyspnea  DATA: CT chest 03/13/17: Severe thoracic kyphoscoliosis. 4 mm nodule in left upper lobe (decreased in size from previously). Minimal emphysema  HPI:  It is difficult to obtain a complete history due to extreme hearing deficit. The patient describes class III exertional dyspnea which has been present for at least a "couple of years". There is little day-to-day variation and little seasonal variation. She denies CP, fever, purulent sputum, hemoptysis, LE edema, calf tenderness, orthopnea and PND. She has been tried on a Symbicort inhaler and has difficulty discerning whether that has been of much benefit to her. However she continues to use inhaler. She has had difficulty affording her medications. She does not have a rescue inhaler due to cost. She is a former smoker of approximately one third pack cigarettes per day for approximately 50 years. She quit in March of this year.  Past Medical History:  Diagnosis Date  . COPD (chronic obstructive pulmonary disease) ([place])   . Hearing aid worn    bilateral  . Hyperlipidemia   . Hypertension   . Osteoporosis   . Personal history of tobacco use, presenting hazards to health 08/15/2015    Past Surgical History:  Procedure Laterality Date  . ABDOMINAL HYSTERECTOMY    . COLONOSCOPY WITH PROPOFOL N/A 11/14/2016   Procedure: COLONOSCOPY WITH PROPOFOL;  Surgeon: [name], MD;  Location: [facility];  Service: Endoscopy;  Laterality: N/A;  . long nodule    . POLYPECTOMY  11/14/2016   Procedure: POLYPECTOMY;  Surgeon: [name], MD;  Location: [facility];  Service: Endoscopy;;    MEDICATIONS: I have reviewed all medications and confirmed regimen as documented  Social  History   Social History  . Marital status: Married    Spouse name: N/A  . Number of children: N/A  . Years of education: N/A   Occupational History  . Not on file.   Social History Main Topics  . Smoking status: Former Smoker    Packs/day: 0.25    Years: 30.00    Types: Cigarettes    Quit date: 01/17/2017  . Smokeless tobacco: Never Used  . Alcohol use No  . Drug use: No  . Sexual activity: Yes   Other Topics Concern  . Not on file   Social History Narrative  . No narrative on file    History reviewed. No pertinent family history.  ROS: No fever, myalgias/arthralgias, unexplained weight loss or weight gain No new focal weakness or sensory deficits No otalgia, hearing loss, visual changes, nasal and sinus symptoms, mouth and throat problems No neck pain or adenopathy No abdominal pain, N/V/D, diarrhea, change in bowel pattern No dysuria, change in urinary pattern   Vitals:   05/11/17 1105  BP: (!) 154/70  Pulse: (!) 103  Resp: 16  SpO2: 93%  Weight: 154 lb (69.9 kg)  Height: 5' 0.3" (1.532 m)     EXAM:  Gen: NAD HEENT: NCAT, sclera white, very poor dentition, bilateral hearing aids Neck: Supple without LAN, thyromegaly, JVD Chest/Lungs: Severe scoliosis, breath sounds mildly diminished without wheezes or other adventitious sounds Cardiovascular: Regular, harsh 3/6 systolic murmur radiating towards neck Abdomen: Soft, nontender, normal BS Ext: without clubbing, cyanosis, edema Neuro: CNs grossly intact, motor and sensory intact Skin: Limited exam, no lesions noted  DATA:  BMP Latest Ref Rng & Units 04/13/2017 09/29/2016 02/01/2016  Glucose 65 - 99 mg/dL 86 79 86  BUN 8 - 27 mg/dL 30(H) 20 21  Creatinine 0.57 - 1.00 mg/dL 1.33(H) 1.01(H) 1.17(H)  BUN/Creat Ratio 12 - 28 23 20 18   Sodium 134 - 144 mmol/L 135 135 136  Potassium 3.5 - 5.2 mmol/L 5.2 4.5 5.0  Chloride 96 - 106 mmol/L 101 97 97  CO2 20 - 29 mmol/L 17(L) 23 22  Calcium 8.7 - 10.3 mg/dL  9.4 10.0 9.7    CBC Latest Ref Rng & Units 04/13/2017 09/29/2016  WBC 3.4 - 10.8 x10E3/uL 9.3 10.5  Hemoglobin 11.1 - 15.9 g/dL 10.8(L) 12.7  Hematocrit 34.0 - 46.6 % 36.3 40.6  Platelets 150 - 379 x10E3/uL 222 201    CXR:  No film available CT chest 03/13/17: Severe thoracic kyphoscoliosis. 4 mm nodule in left upper lobe (decreased in size from previously). Minimal emphysema  IMPRESSION:     ICD-10-CM   1. Former smoker Z87.891   2. DOE (dyspnea on exertion) R06.09 Pulmonary Function Test [facility] Only  3. Severe thoracic kyphoscoliosis  M41.9   4. Centrilobular emphysema J43.2   5. Heart murmur R01.1 ECHOCARDIOGRAM COMPLETE  6. Lung nodule R91.1    - Smoking history is minimal - Emphysema by CT scan is minimal to mild - Exertional dyspnea is likely not explained fully by COPD/emphysema - Severe kyphoscoliosis might be negatively impacting her respiratory mechanics - Prominent heart murmur noted - she indicates that she has had an echocardiogram done but I see no results available. A cardiac valvular problem might be contributing to her exertional dyspnea - Lung nodule has decreased in size and can be assumed to be benign. No further follow-up is necessary  PLAN:  1) continue Symbicort inhaler - in the interest of cost saving, I suggested that this can be used in the morning only 2) I've ordered PFTs 3) echocardiogram ordered 4) follow-up in 4-6 weeks   Merton Border, MD PCCM service Mobile [phone] Pager [phone] 05/11/2017 11:30 AM

## 2017-05-11 NOTE — Patient Instructions (Signed)
I have ordered an echocardiogram to evaluate your heart murmur  I have ordered PFTs (lung function tests) to evaluate your lung function  Continue Symbicort inhaler - you may use this in the mornings only to save on cost  Follow-up in 4-6 weeks

## 2017-05-19 NOTE — Telephone Encounter (Signed)
Letter generated and sent to patient.  

## 2017-05-26 ENCOUNTER — Other Ambulatory Visit: Payer: Medicare Other

## 2017-05-26 DIAGNOSIS — N183 Chronic kidney disease, stage 3 unspecified: Secondary | ICD-10-CM

## 2017-05-27 ENCOUNTER — Ambulatory Visit (INDEPENDENT_AMBULATORY_CARE_PROVIDER_SITE_OTHER): Payer: Medicare Other

## 2017-05-27 ENCOUNTER — Other Ambulatory Visit: Payer: Self-pay

## 2017-05-27 ENCOUNTER — Telehealth: Payer: Self-pay | Admitting: Unknown Physician Specialty

## 2017-05-27 DIAGNOSIS — N183 Chronic kidney disease, stage 3 unspecified: Secondary | ICD-10-CM

## 2017-05-27 DIAGNOSIS — R011 Cardiac murmur, unspecified: Secondary | ICD-10-CM

## 2017-05-27 LAB — COMPREHENSIVE METABOLIC PANEL
A/G RATIO: 1.6 (ref 1.2–2.2)
ALK PHOS: 83 IU/L (ref 39–117)
ALT: 8 IU/L (ref 0–32)
AST: 15 IU/L (ref 0–40)
Albumin: 4.3 g/dL (ref 3.5–4.8)
BUN/Creatinine Ratio: 18 (ref 12–28)
BUN: 22 mg/dL (ref 8–27)
Bilirubin Total: 0.3 mg/dL (ref 0.0–1.2)
CO2: 21 mmol/L (ref 20–29)
CREATININE: 1.25 mg/dL — AB (ref 0.57–1.00)
Calcium: 9.6 mg/dL (ref 8.7–10.3)
Chloride: 102 mmol/L (ref 96–106)
GFR calc Af Amer: 50 mL/min/{1.73_m2} — ABNORMAL LOW (ref >59)
GFR calc non Af Amer: 43 mL/min/{1.73_m2} — ABNORMAL LOW (ref >59)
GLOBULIN, TOTAL: 2.7 g/dL (ref 1.5–4.5)
Glucose: 109 mg/dL — ABNORMAL HIGH (ref 65–99)
POTASSIUM: 4.7 mmol/L (ref 3.5–5.2)
Sodium: 138 mmol/L (ref 134–144)
Total Protein: 7 g/dL (ref 6.0–8.5)

## 2017-05-27 NOTE — Telephone Encounter (Signed)
Discussed with daughter about labs.  Avoid NSAIDS.  Refer to nephrology

## 2017-05-27 NOTE — Assessment & Plan Note (Signed)
Refer to nephrolgy

## 2017-06-09 ENCOUNTER — Ambulatory Visit: Payer: Medicare Other

## 2017-06-15 ENCOUNTER — Ambulatory Visit: Payer: Self-pay | Admitting: Pulmonary Disease

## 2017-06-16 ENCOUNTER — Encounter: Payer: Self-pay | Admitting: Pulmonary Disease

## 2017-06-17 ENCOUNTER — Ambulatory Visit (INDEPENDENT_AMBULATORY_CARE_PROVIDER_SITE_OTHER): Payer: Medicare Other | Admitting: Internal Medicine

## 2017-06-17 ENCOUNTER — Encounter: Payer: Self-pay | Admitting: Internal Medicine

## 2017-06-17 VITALS — BP 144/76 | HR 76 | Ht 61.0 in | Wt 155.8 lb

## 2017-06-17 DIAGNOSIS — I10 Essential (primary) hypertension: Secondary | ICD-10-CM

## 2017-06-17 DIAGNOSIS — I2584 Coronary atherosclerosis due to calcified coronary lesion: Secondary | ICD-10-CM

## 2017-06-17 DIAGNOSIS — R011 Cardiac murmur, unspecified: Secondary | ICD-10-CM

## 2017-06-17 DIAGNOSIS — I2510 Atherosclerotic heart disease of native coronary artery without angina pectoris: Secondary | ICD-10-CM | POA: Diagnosis not present

## 2017-06-17 DIAGNOSIS — R0602 Shortness of breath: Secondary | ICD-10-CM | POA: Diagnosis not present

## 2017-06-17 NOTE — Patient Instructions (Addendum)
Medication Instructions:  Your physician recommends that you continue on your current medications as directed. Please refer to the Current Medication list given to you today.   Labwork: none  Testing/Procedures: Your physician has requested that you have a lexiscan myoview. For further information please visit [email or web address]. Please follow instruction sheet, as given.  [place]  Your caregiver has ordered a Stress Test with nuclear imaging. The purpose of this test is to evaluate the blood supply to your heart muscle. This procedure is referred to as a "Non-Invasive Stress Test." This is because other than having an IV started in your vein, nothing is inserted or "invades" your body. Cardiac stress tests are done to find areas of poor blood flow to the heart by determining the extent of coronary artery disease (CAD). Some patients exercise on a treadmill, which naturally increases the blood flow to your heart, while others who are  unable to walk on a treadmill due to physical limitations have a pharmacologic/chemical stress agent called Lexiscan . This medicine will mimic walking on a treadmill by temporarily increasing your coronary blood flow.   Please note: these test may take anywhere between 2-4 hours to complete  PLEASE REPORT TO [facility] AT THE FIRST DESK WILL DIRECT YOU WHERE TO GO  Date of Procedure:_Friday, Sept 7_______  Arrival Time for Procedure:__7:45am____  Instructions regarding medication:   _xx___:  Hold hydrochlorothiazide the morning of your test.   PLEASE NOTIFY THE OFFICE AT LEAST 24 HOURS IN ADVANCE IF YOU ARE UNABLE TO KEEP YOUR APPOINTMENT.  [phone] AND  PLEASE NOTIFY NUCLEAR MEDICINE AT [facility] AT LEAST 24 HOURS IN ADVANCE IF YOU ARE UNABLE TO KEEP YOUR APPOINTMENT. [phone]  How to prepare for your Myoview test:  1. Do not eat or drink after midnight 2. No caffeine for 24 hours prior to test 3. No smoking  24 hours prior to test. 4. Your medication may be taken with water.  If your doctor stopped a medication because of this test, do not take that medication. 5. Ladies, please do not wear dresses.  Skirts or pants are appropriate. Please wear a short sleeve shirt. 6. No perfume, cologne or lotion. 7. Wear comfortable walking shoes. No heels!            Follow-Up: Your physician recommends that you schedule a follow-up appointment in: 6 weeks with Dr. Saunders Revel.    Any Other Special Instructions Will Be Listed Below (If Applicable).     If you need a refill on your cardiac medications before your next appointment, please call your pharmacy.  Cardiac Nuclear Scan A cardiac nuclear scan is a test that measures blood flow to the heart when a person is resting and when he or she is exercising. The test looks for problems such as:  Not enough blood reaching a portion of the heart.  The heart muscle not working normally.  You may need this test if:  You have heart disease.  You have had abnormal lab results.  You have had heart surgery or angioplasty.  You have chest pain.  You have shortness of breath.  In this test, a radioactive dye (tracer) is injected into your bloodstream. After the tracer has traveled to your heart, an imaging device is used to measure how much of the tracer is absorbed by or distributed to various areas of your heart. This procedure is usually done at a hospital and takes 2-4 hours. Tell a health care provider  about:  Any allergies you have.  All medicines you are taking, including vitamins, herbs, eye drops, creams, and over-the-counter medicines.  Any problems you or family members have had with the use of anesthetic medicines.  Any blood disorders you have.  Any surgeries you have had.  Any medical conditions you have.  Whether you are pregnant or may be pregnant. What are the risks? Generally, this is a safe procedure. However, problems may  occur, including:  Serious chest pain and heart attack. This is only a risk if the stress portion of the test is done.  Rapid heartbeat.  Sensation of warmth in your chest. This usually passes quickly.  What happens before the procedure?  Ask your health care provider about changing or stopping your regular medicines. This is especially important if you are taking diabetes medicines or blood thinners.  Remove your jewelry on the day of the procedure. What happens during the procedure?  An IV tube will be inserted into one of your veins.  Your health care provider will inject a small amount of radioactive tracer through the tube.  You will wait for 20-40 minutes while the tracer travels through your bloodstream.  Your heart activity will be monitored with an electrocardiogram (ECG).  You will lie down on an exam table.  Images of your heart will be taken for about 15-20 minutes.  You may be asked to exercise on a treadmill or stationary bike. While you exercise, your heart's activity will be monitored with an ECG, and your blood pressure will be checked. If you are unable to exercise, you may be given a medicine to increase blood flow to parts of your heart.  When blood flow to your heart has peaked, a tracer will again be injected through the IV tube.  After 20-40 minutes, you will get back on the exam table and have more images taken of your heart.  When the procedure is over, your IV tube will be removed. The procedure may vary among health care providers and hospitals. Depending on the type of tracer used, scans may need to be repeated 3-4 hours later. What happens after the procedure?  Unless your health care provider tells you otherwise, you may return to your normal schedule, including diet, activities, and medicines.  Unless your health care provider tells you otherwise, you may increase your fluid intake. This will help flush the contrast dye from your body. Drink  enough fluid to keep your urine clear or pale yellow.  It is up to you to get your test results. Ask your health care provider, or the department that is doing the test, when your results will be ready. Summary  A cardiac nuclear scan measures the blood flow to the heart when a person is resting and when he or she is exercising.  You may need this test if you are at risk for heart disease.  Tell your health care provider if you are pregnant.  Unless your health care provider tells you otherwise, increase your fluid intake. This will help flush the contrast dye from your body. Drink enough fluid to keep your urine clear or pale yellow. This information is not intended to replace advice given to you by your health care provider. Make sure you discuss any questions you have with your health care provider. Document Released: 10/31/2004 Document Revised: 10/08/2016 Document Reviewed: 09/14/2013 Elsevier Interactive Patient Education  2017 [employer].

## 2017-06-17 NOTE — Progress Notes (Signed)
New Outpatient Visit Date: 06/17/2017  Referring Provider: [name], NP [address], [place] [address]  Chief Complaint: Shortness of breath and heart murmur  HPI:  Madison Franklin is a 71 y.o. female who is being seen today for the evaluation of shortness of breath and heart murmur at the request of Madison Franklin. She has a history of hypertension, hyperlipidemia, scoliosis, and COPD. Madison Franklin reports progressive shortness of breath over the last year. She was barely able walk from our waiting room to the exam room without needed to stop to catch her breath (50-100 feet). She denies orthopnea, PND, edema, chest pain, and palpitations. Madison Franklin was started on Symbicort by Dr. Alva Garnet after recent consultation and has noted some improvement in her shortness of breath.  A heart murmur was recently noted by Madison Franklin as well as Dr. Alva Garnet. Subsequent echocardiogram was notable only for LVH with diastolic dysfunction and trivial tricuspid regurgitation. Madison Franklin denies history of cardiac disease and other cardiac workup. Chest CT in May was notable for enlargement of the pulmonary artery as well as coronary artery calcification.  Home blood pressures are typically well-controlled (120-130/60-70).  --------------------------------------------------------------------------------------------------  Cardiovascular History & Procedures: Cardiovascular Problems:  Dyspnea on exertion  Heart murmur  Coronary artery calcification  Risk Factors:  Hypertension, hyperlipidemia, age > 56, sedentary lifestyle, and history of tobacco use  Cath/PCI:  None  CV Surgery:  None  EP Procedures and Devices:  None  Non-Invasive Evaluation(s):  TTE (05/28/17): Normal LV size with mild to moderate LVH. LVEF 60-65% with grade 1 diastolic dysfunction. Normal RV size and function. Trace TR. PA pressure could not be estimated.  Recent CV Pertinent Labs: Lab Results  Component Value Date   CHOL 198  04/13/2017   HDL 62 04/13/2017   LDLCALC 108 (H) 04/13/2017   TRIG 139 04/13/2017   K 4.7 05/26/2017   BUN 22 05/26/2017   CREATININE 1.25 (H) 05/26/2017    --------------------------------------------------------------------------------------------------  Past Medical History:  Diagnosis Date  . COPD (chronic obstructive pulmonary disease) ([place])   . Hearing aid worn    bilateral  . Hyperlipidemia   . Hypertension   . Osteoporosis   . Personal history of tobacco use, presenting hazards to health 08/15/2015    Past Surgical History:  Procedure Laterality Date  . ABDOMINAL HYSTERECTOMY    . COLONOSCOPY WITH PROPOFOL N/A 11/14/2016   Procedure: COLONOSCOPY WITH PROPOFOL;  Surgeon: [name], MD;  Location: [facility];  Service: Endoscopy;  Laterality: N/A;  . long nodule    . POLYPECTOMY  11/14/2016   Procedure: POLYPECTOMY;  Surgeon: [name], MD;  Location: [facility];  Service: Endoscopy;;    Current Meds  Medication Sig  . amLODipine (NORVASC) 5 MG tablet Take 1 tablet (5 mg total) by mouth daily.  Marland Kitchen aspirin 81 MG tablet Take 81 mg by mouth daily.  Marland Kitchen atorvastatin (LIPITOR) 10 MG tablet Take 1 tablet (10 mg total) by mouth daily.  . budesonide-formoterol (SYMBICORT) 160-4.5 MCG/ACT inhaler Inhale 2 puffs into the lungs 2 (two) times daily.  . hydrochlorothiazide (MICROZIDE) 12.5 MG capsule Take 1 capsule (12.5 mg total) by mouth daily.  Marland Kitchen lisinopril (PRINIVIL,ZESTRIL) 40 MG tablet Take 1 tablet (40 mg total) by mouth daily.    Allergies: Patient has no known allergies.  Social History   Social History  . Marital status: Married    Spouse name: N/A  . Number of children: N/A  . Years of education: N/A  Occupational History  . Not on file.   Social History Main Topics  . Smoking status: Former Smoker    Packs/day: 0.25    Years: 30.00    Types: Cigarettes    Quit date: 01/17/2017  . Smokeless tobacco: Never Used  . Alcohol use No  .  Drug use: No  . Sexual activity: Yes   Other Topics Concern  . Not on file   Social History Narrative  . No narrative on file    History reviewed. No pertinent family history.  Review of Systems: A 12-system review of systems was performed and was negative except as noted in the HPI.  --------------------------------------------------------------------------------------------------  Physical Exam: BP (!) 144/76 (BP Location: Right Arm, Patient Position: Sitting, Cuff Size: Normal)   Pulse 76   Ht 5\' 1"  (1.549 m)   Wt 155 lb 12 oz (70.6 kg)   LMP  (LMP Unknown)   BMI 29.43 kg/m   General:  Overweight woman, seated comfortably in the exam room. She is accompanied by her daughter. HEENT: No conjunctival pallor or scleral icterus. Moist mucous membranes. OP clear. Poor dentition. Neck: Supple without lymphadenopathy, thyromegaly, JVD, or HJR. No carotid bruit. Lungs: Normal work of breathing. Coarse breath sounds bilaterally with fair air movement. No wheezes or crackles. Heart: Regular rate and rhythm with 2/6 holosystolic murmur loudest at the LLSB. S4 is present. No rubs. Non-displaced PMI. Abd: Bowel sounds present. Soft, NT/ND without hepatosplenomegaly Ext: No lower extremity edema. Radial, PT, and DP pulses are 2+ bilaterally Skin: Warm and dry without rash. Neuro: CNIII-XII intact. Strength and fine-touch sensation intact in upper and lower extremities bilaterally. Psych: Normal mood and affect.  EKG:  Normal sinus rhythm and low voltage with baseline artifact.  Lab Results  Component Value Date   WBC 9.3 04/13/2017   HGB 10.8 (L) 04/13/2017   HCT 36.3 04/13/2017   MCV 86 04/13/2017   PLT 222 04/13/2017    Lab Results  Component Value Date   NA 138 05/26/2017   K 4.7 05/26/2017   CL 102 05/26/2017   CO2 21 05/26/2017   BUN 22 05/26/2017   CREATININE 1.25 (H) 05/26/2017   GLUCOSE 109 (H) 05/26/2017   ALT 8 05/26/2017    Lab Results  Component Value Date    CHOL 198 04/13/2017   HDL 62 04/13/2017   LDLCALC 108 (H) 04/13/2017   TRIG 139 04/13/2017   --------------------------------------------------------------------------------------------------  ASSESSMENT AND PLAN: Shortness of breath Dyspnea is likely multifactorial with some component of COPD. I have personally reviewed Madison Franklin echo and chest CT images. Given findings of PA enlargement and coronary artery calcification on chest CT, pulmonary hypertension and/or CAD could also be causing her symptoms. We have agreed to perform a pharmacologic myocardial perfusion stress test to evaluate for significant ischemia. If the study is low risk, we will proceed with right heart catheterization to assess for pulmonary hypertension. If there is evidence of ischemia or prior infarct by MPI, we will perform left and right heart catheterization with coronary angiography. I have reviewed the risks, indications, and alternatives to cardiac catheterization, possible angioplasty, and stenting with the patient. Risks include but are not limited to bleeding, infection, vascular injury, stroke, myocardial infection, arrhythmia, kidney injury, radiation-related injury in the case of prolonged fluoroscopy use, emergency cardiac surgery, and death. The patient understands the risks of serious complication is 1-2 in 6761 with diagnostic cardiac cath and 1-2% or less with angioplasty/stenting.  Coronary artery calcification As above, coronary  artery calcification suggests some degree of CAD that could be contributing to Madison Franklin dyspnea. We will proceed with stress test as above. I recommend continuation of aspirin and atorvastatin. Based on results of stress test +/- coronary angiography, atorvastatin may need to be escalated.  Heart murmur Soft systolic murmur noted. No significant valvular disease noted on recent echo. Murmur may represent trace tricuspid regurgitation or some degree of intracavitary gradient,  given mild to moderate LVH. No further work-up at this time.  Hypertension Blood pressure mildly elevated today but better at home per patient's report. She should continue her current medications and follow-up with Madison Franklin.  Follow-up: Return to clinic in 6 weeks.  Nelva Bush, MD 06/17/2017 3:36 PM

## 2017-06-18 DIAGNOSIS — I2510 Atherosclerotic heart disease of native coronary artery without angina pectoris: Secondary | ICD-10-CM | POA: Insufficient documentation

## 2017-06-18 DIAGNOSIS — I2584 Coronary atherosclerosis due to calcified coronary lesion: Secondary | ICD-10-CM

## 2017-06-18 DIAGNOSIS — R0602 Shortness of breath: Secondary | ICD-10-CM | POA: Insufficient documentation

## 2017-06-25 ENCOUNTER — Telehealth: Payer: Self-pay | Admitting: Unknown Physician Specialty

## 2017-06-25 NOTE — Telephone Encounter (Signed)
Routing to provider for advice.

## 2017-06-25 NOTE — Telephone Encounter (Signed)
Patients daughter Levada Dy called stating the patient has been losing large amounts of hair in which they are really getting concerned about. They would like to know if she could be having a reaction to a medication she is taking and also stated that she is sleeping more than usual.  Levada Dy [phone]  Thank You

## 2017-06-25 NOTE — Telephone Encounter (Signed)
I can't see a medication that might be causing those symptoms.  It looks like she needs to be seen

## 2017-06-26 ENCOUNTER — Ambulatory Visit
Admission: RE | Admit: 2017-06-26 | Discharge: 2017-06-26 | Disposition: A | Discharge status: Home / Self Care | Payer: Medicare Other | Source: Ambulatory Visit | Attending: Internal Medicine | Admitting: Internal Medicine

## 2017-06-26 DIAGNOSIS — I2510 Atherosclerotic heart disease of native coronary artery without angina pectoris: Secondary | ICD-10-CM | POA: Insufficient documentation

## 2017-06-26 DIAGNOSIS — R0602 Shortness of breath: Secondary | ICD-10-CM | POA: Diagnosis not present

## 2017-06-26 DIAGNOSIS — I2584 Coronary atherosclerosis due to calcified coronary lesion: Secondary | ICD-10-CM

## 2017-06-26 LAB — NM MYOCAR MULTI W/SPECT W/WALL MOTION / EF
CHL CUP NUCLEAR SRS: 0
LVDIAVOL: 32 mL (ref 46–106)
LVSYSVOL: 9 mL
NUC STRESS TID: 0.74
Peak HR: 104 {beats}/min
Percent HR: 70 %
Rest HR: 75 {beats}/min
SDS: 0
SSS: 0

## 2017-06-26 MED ORDER — TECHNETIUM TC 99M TETROFOSMIN IV KIT
12.7300 | PACK | Freq: Once | INTRAVENOUS | Status: AC | PRN
  Administered 2017-06-26: 12.73 via INTRAVENOUS

## 2017-06-26 MED ORDER — TECHNETIUM TC 99M TETROFOSMIN IV KIT
30.0000 | PACK | Freq: Once | INTRAVENOUS | Status: AC | PRN
  Administered 2017-06-26: 30 via INTRAVENOUS

## 2017-06-26 MED ORDER — REGADENOSON 0.4 MG/5ML IV SOLN
0.4000 mg | Freq: Once | INTRAVENOUS | Status: AC
  Administered 2017-06-26: 0.4 mg via INTRAVENOUS

## 2017-06-26 NOTE — Telephone Encounter (Signed)
Spoke with patients husband. He scheduled appt for patient 9/11.  He said his wife was not aware of his concern over her hair loss.  Just FYI

## 2017-06-26 NOTE — Telephone Encounter (Signed)
Please call and schedule patient an appointment with Malachy Mood for the symptoms described per Malachy Mood. Thanks.

## 2017-06-29 ENCOUNTER — Telehealth: Payer: Self-pay | Admitting: *Deleted

## 2017-06-29 ENCOUNTER — Other Ambulatory Visit: Payer: Self-pay | Admitting: Internal Medicine

## 2017-06-29 DIAGNOSIS — R0602 Shortness of breath: Secondary | ICD-10-CM

## 2017-06-29 DIAGNOSIS — I272 Pulmonary hypertension, unspecified: Secondary | ICD-10-CM

## 2017-06-29 DIAGNOSIS — Z01810 Encounter for preprocedural cardiovascular examination: Secondary | ICD-10-CM

## 2017-06-29 NOTE — Telephone Encounter (Signed)
Results called to pt and son. Pt and son verbalized understanding. Patient is hard of hearing and gave permission for me to schedule procedure with son. Son verbalized understanding of the following heart cath instructions. Patient will plan to go on 07/06/17 to [place] for pre-op lab work.   [facility] Cardiac Cath Instructions   You are scheduled for a Cardiac Cath on:___09/25/18_________  Please arrive at _06:30 am on the day of your procedure  Please expect a call from our [facility] to pre-register you  Do not eat/drink anything after midnight  Someone will need to drive you home  It is recommended someone be with you for the first 24 hours after your procedure  Wear clothes that are easy to get on/off and wear slip on shoes if possible   Medications bring a current list of all medications with you  _X__ You may take all of your medications the morning of your procedure with enough water to swallow safely   Day of your procedure: Arrive at the [facility] entrance.  Free valet service is available.  After entering the [facility] please check-in at the registration desk (1st desk on your right) to receive your armband. After receiving your armband someone will escort you to the cardiac cath/special procedures waiting area.  The usual length of stay after your procedure is about 2 to 3 hours.  This can vary.  If you have any questions, please call our office at [phone], or you may call the cardiac cath lab at [facility] directly at [phone]

## 2017-06-29 NOTE — Telephone Encounter (Signed)
-----   Message from Nelva Bush, MD sent at 06/29/2017  7:31 AM EDT ----- Please let Ms. Jansma know that her stress test is norma without evidence of a significant blockabe. As we discussed at our visit last month, I recommend that we perform a right heart catheterization to evaluate for pulmonary hypertension. This can be scheduled with me at the patient's convenience. Thanks.

## 2017-06-30 ENCOUNTER — Ambulatory Visit: Payer: Medicare Other | Admitting: Unknown Physician Specialty

## 2017-07-06 ENCOUNTER — Telehealth: Payer: Self-pay | Admitting: Pulmonary Disease

## 2017-07-06 NOTE — Telephone Encounter (Signed)
PT daughter calling Pt has a cath procedure on 9/25 at 7:30a but also has a PFT scheduled same day at 1:00p Should PFT be rescheduled or should she keep appt Please call to advise

## 2017-07-06 NOTE — Telephone Encounter (Signed)
Spoke with daughter and informed her to have pt to reschedule her PFT due to her having a cath on the same day. Number given. Nothing further needed.

## 2017-07-07 ENCOUNTER — Other Ambulatory Visit
Admission: RE | Admit: 2017-07-07 | Discharge: 2017-07-07 | Disposition: A | Discharge status: Home / Self Care | Payer: Medicare Other | Source: Ambulatory Visit | Attending: Internal Medicine | Admitting: Internal Medicine

## 2017-07-07 DIAGNOSIS — I272 Pulmonary hypertension, unspecified: Secondary | ICD-10-CM | POA: Diagnosis not present

## 2017-07-07 DIAGNOSIS — Z01810 Encounter for preprocedural cardiovascular examination: Secondary | ICD-10-CM | POA: Diagnosis not present

## 2017-07-07 LAB — CBC WITH DIFFERENTIAL/PLATELET
BASOS ABS: 0.1 10*3/uL (ref 0–0.1)
BASOS PCT: 1 %
EOS ABS: 0.2 10*3/uL (ref 0–0.7)
Eosinophils Relative: 2 %
HEMATOCRIT: 33.5 % — AB (ref 35.0–47.0)
HEMOGLOBIN: 10.9 g/dL — AB (ref 12.0–16.0)
Lymphocytes Relative: 20 %
Lymphs Abs: 1.9 10*3/uL (ref 1.0–3.6)
MCH: 25.6 pg — ABNORMAL LOW (ref 26.0–34.0)
MCHC: 32.4 g/dL (ref 32.0–36.0)
MCV: 79 fL — ABNORMAL LOW (ref 80.0–100.0)
Monocytes Absolute: 0.7 10*3/uL (ref 0.2–0.9)
Monocytes Relative: 7 %
NEUTROS ABS: 6.8 10*3/uL — AB (ref 1.4–6.5)
NEUTROS PCT: 70 %
Platelets: 185 10*3/uL (ref 150–440)
RBC: 4.24 MIL/uL (ref 3.80–5.20)
RDW: 15.1 % — ABNORMAL HIGH (ref 11.5–14.5)
WBC: 9.7 10*3/uL (ref 3.6–11.0)

## 2017-07-07 LAB — BASIC METABOLIC PANEL
ANION GAP: 8 (ref 5–15)
BUN: 29 mg/dL — ABNORMAL HIGH (ref 6–20)
CO2: 23 mmol/L (ref 22–32)
CREATININE: 1.24 mg/dL — AB (ref 0.44–1.00)
Calcium: 9.6 mg/dL (ref 8.9–10.3)
Chloride: 106 mmol/L (ref 101–111)
GFR calc non Af Amer: 42 mL/min — ABNORMAL LOW (ref >60)
GFR, EST AFRICAN AMERICAN: 49 mL/min — AB (ref >60)
Glucose, Bld: 116 mg/dL — ABNORMAL HIGH (ref 65–99)
Potassium: 4.2 mmol/L (ref 3.5–5.1)
SODIUM: 137 mmol/L (ref 135–145)

## 2017-07-07 LAB — PROTIME-INR
INR: 1
PROTHROMBIN TIME: 13.1 s (ref 11.4–15.2)

## 2017-07-14 ENCOUNTER — Encounter
Admission: RE | Disposition: A | Discharge status: Home / Self Care | Payer: Self-pay | Source: Ambulatory Visit | Attending: Internal Medicine

## 2017-07-14 ENCOUNTER — Ambulatory Visit
Admission: RE | Admit: 2017-07-14 | Discharge: 2017-07-14 | Disposition: A | Discharge status: Home / Self Care | Payer: Medicare Other | Source: Ambulatory Visit | Attending: Internal Medicine | Admitting: Internal Medicine

## 2017-07-14 DIAGNOSIS — M810 Age-related osteoporosis without current pathological fracture: Secondary | ICD-10-CM | POA: Diagnosis not present

## 2017-07-14 DIAGNOSIS — M419 Scoliosis, unspecified: Secondary | ICD-10-CM | POA: Insufficient documentation

## 2017-07-14 DIAGNOSIS — Z79899 Other long term (current) drug therapy: Secondary | ICD-10-CM | POA: Insufficient documentation

## 2017-07-14 DIAGNOSIS — Z7982 Long term (current) use of aspirin: Secondary | ICD-10-CM | POA: Insufficient documentation

## 2017-07-14 DIAGNOSIS — J449 Chronic obstructive pulmonary disease, unspecified: Secondary | ICD-10-CM | POA: Diagnosis not present

## 2017-07-14 DIAGNOSIS — R011 Cardiac murmur, unspecified: Secondary | ICD-10-CM | POA: Insufficient documentation

## 2017-07-14 DIAGNOSIS — I272 Pulmonary hypertension, unspecified: Secondary | ICD-10-CM | POA: Insufficient documentation

## 2017-07-14 DIAGNOSIS — R0602 Shortness of breath: Secondary | ICD-10-CM | POA: Diagnosis not present

## 2017-07-14 DIAGNOSIS — Z87891 Personal history of nicotine dependence: Secondary | ICD-10-CM | POA: Diagnosis not present

## 2017-07-14 DIAGNOSIS — H9193 Unspecified hearing loss, bilateral: Secondary | ICD-10-CM | POA: Diagnosis not present

## 2017-07-14 DIAGNOSIS — Z90710 Acquired absence of both cervix and uterus: Secondary | ICD-10-CM | POA: Insufficient documentation

## 2017-07-14 DIAGNOSIS — E785 Hyperlipidemia, unspecified: Secondary | ICD-10-CM | POA: Diagnosis not present

## 2017-07-14 DIAGNOSIS — I2510 Atherosclerotic heart disease of native coronary artery without angina pectoris: Secondary | ICD-10-CM | POA: Insufficient documentation

## 2017-07-14 DIAGNOSIS — I10 Essential (primary) hypertension: Secondary | ICD-10-CM | POA: Insufficient documentation

## 2017-07-14 HISTORY — PX: RIGHT HEART CATH: CATH118263

## 2017-07-14 SURGERY — RIGHT HEART CATH
Anesthesia: Moderate Sedation | Laterality: Right

## 2017-07-14 MED ORDER — LIDOCAINE HCL (PF) 1 % IJ SOLN
INTRAMUSCULAR | Status: AC
Start: 2017-07-14 — End: ?
  Filled 2017-07-14: qty 30

## 2017-07-14 MED ORDER — SODIUM CHLORIDE 0.9% FLUSH: 3.0000 mL | Freq: Two times a day (BID) | INTRAVENOUS | Status: DC

## 2017-07-14 MED ORDER — SODIUM CHLORIDE 0.9 % IV SOLN: 250.0000 mL | INTRAVENOUS | Status: DC | PRN

## 2017-07-14 MED ORDER — HEPARIN (PORCINE) IN NACL 2-0.9 UNIT/ML-% IJ SOLN
INTRAMUSCULAR | Status: AC
  Filled 2017-07-14: qty 500

## 2017-07-14 MED ORDER — MIDAZOLAM HCL 2 MG/2ML IJ SOLN
INTRAMUSCULAR | Status: AC
  Filled 2017-07-14: qty 2

## 2017-07-14 MED ORDER — SODIUM CHLORIDE 0.9% FLUSH: 3.0000 mL | INTRAVENOUS | Status: DC | PRN

## 2017-07-14 MED ORDER — FENTANYL CITRATE (PF) 100 MCG/2ML IJ SOLN
INTRAMUSCULAR | Status: AC
  Filled 2017-07-14: qty 2

## 2017-07-14 MED ORDER — SODIUM CHLORIDE 0.9 % IV SOLN
INTRAVENOUS | Status: DC
  Administered 2017-07-14: 07:00:00 via INTRAVENOUS

## 2017-07-14 SURGICAL SUPPLY — 5 items
CATH BALLN WEDGE 5F 110CM (CATHETERS) ×3 IMPLANT
GLIDESHEATH SLEND SS 6F .021 (SHEATH) ×3 IMPLANT
KIT MANI 3VAL PERCEP (MISCELLANEOUS) ×3 IMPLANT
PACK CARDIAC CATH (CUSTOM PROCEDURE TRAY) ×3 IMPLANT
WIRE ROSEN-J .035X260CM (WIRE) IMPLANT

## 2017-07-14 NOTE — Interval H&P Note (Signed)
History and Physical Interval Note:  07/14/2017 7:17 AM  Madison Franklin  has presented today for cardiac catheterization, with the diagnosis of shortness of breath. The various methods of treatment have been discussed with the patient and family. After consideration of risks, benefits and other options for treatment, the patient has consented to  Procedure(s): RIGHT HEART CATH (Right) as a surgical intervention .  The patient's history has been reviewed, patient examined, no change in status, stable for surgery.  I have reviewed the patient's chart and labs.  Questions were answered to the patient's satisfaction.    [name]

## 2017-07-14 NOTE — H&P (View-Only) (Signed)
New Outpatient Visit Date: 06/17/2017  Referring Provider: [name], NP [address], [place] [address]  Chief Complaint: Shortness of breath and heart murmur  HPI:  Ms. Gilkey is a 71 y.o. female who is being seen today for the evaluation of shortness of breath and heart murmur at the request of Ms. Wicker. She has a history of hypertension, hyperlipidemia, scoliosis, and COPD. Ms. Seidenberg reports progressive shortness of breath over the last year. She was barely able walk from our waiting room to the exam room without needed to stop to catch her breath (50-100 feet). She denies orthopnea, PND, edema, chest pain, and palpitations. Ms. Zellers was started on Symbicort by Dr. Alva Garnet after recent consultation and has noted some improvement in her shortness of breath.  A heart murmur was recently noted by Ms. Wicker as well as Dr. Alva Garnet. Subsequent echocardiogram was notable only for LVH with diastolic dysfunction and trivial tricuspid regurgitation. Ms. Sparkman denies history of cardiac disease and other cardiac workup. Chest CT in May was notable for enlargement of the pulmonary artery as well as coronary artery calcification.  Home blood pressures are typically well-controlled (120-130/60-70).  --------------------------------------------------------------------------------------------------  Cardiovascular History & Procedures: Cardiovascular Problems:  Dyspnea on exertion  Heart murmur  Coronary artery calcification  Risk Factors:  Hypertension, hyperlipidemia, age > 40, sedentary lifestyle, and history of tobacco use  Cath/PCI:  None  CV Surgery:  None  EP Procedures and Devices:  None  Non-Invasive Evaluation(s):  TTE (05/28/17): Normal LV size with mild to moderate LVH. LVEF 60-65% with grade 1 diastolic dysfunction. Normal RV size and function. Trace TR. PA pressure could not be estimated.  Recent CV Pertinent Labs: Lab Results  Component Value Date   CHOL 198  04/13/2017   HDL 62 04/13/2017   LDLCALC 108 (H) 04/13/2017   TRIG 139 04/13/2017   K 4.7 05/26/2017   BUN 22 05/26/2017   CREATININE 1.25 (H) 05/26/2017    --------------------------------------------------------------------------------------------------  Past Medical History:  Diagnosis Date  . COPD (chronic obstructive pulmonary disease) ([place])   . Hearing aid worn    bilateral  . Hyperlipidemia   . Hypertension   . Osteoporosis   . Personal history of tobacco use, presenting hazards to health 08/15/2015    Past Surgical History:  Procedure Laterality Date  . ABDOMINAL HYSTERECTOMY    . COLONOSCOPY WITH PROPOFOL N/A 11/14/2016   Procedure: COLONOSCOPY WITH PROPOFOL;  Surgeon: [name], MD;  Location: [facility];  Service: Endoscopy;  Laterality: N/A;  . long nodule    . POLYPECTOMY  11/14/2016   Procedure: POLYPECTOMY;  Surgeon: [name], MD;  Location: [facility];  Service: Endoscopy;;    Current Meds  Medication Sig  . amLODipine (NORVASC) 5 MG tablet Take 1 tablet (5 mg total) by mouth daily.  Marland Kitchen aspirin 81 MG tablet Take 81 mg by mouth daily.  Marland Kitchen atorvastatin (LIPITOR) 10 MG tablet Take 1 tablet (10 mg total) by mouth daily.  . budesonide-formoterol (SYMBICORT) 160-4.5 MCG/ACT inhaler Inhale 2 puffs into the lungs 2 (two) times daily.  . hydrochlorothiazide (MICROZIDE) 12.5 MG capsule Take 1 capsule (12.5 mg total) by mouth daily.  Marland Kitchen lisinopril (PRINIVIL,ZESTRIL) 40 MG tablet Take 1 tablet (40 mg total) by mouth daily.    Allergies: Patient has no known allergies.  Social History   Social History  . Marital status: Married    Spouse name: N/A  . Number of children: N/A  . Years of education: N/A  Occupational History  . Not on file.   Social History Main Topics  . Smoking status: Former Smoker    Packs/day: 0.25    Years: 30.00    Types: Cigarettes    Quit date: 01/17/2017  . Smokeless tobacco: Never Used  . Alcohol use No  .  Drug use: No  . Sexual activity: Yes   Other Topics Concern  . Not on file   Social History Narrative  . No narrative on file    History reviewed. No pertinent family history.  Review of Systems: A 12-system review of systems was performed and was negative except as noted in the HPI.  --------------------------------------------------------------------------------------------------  Physical Exam: BP (!) 144/76 (BP Location: Right Arm, Patient Position: Sitting, Cuff Size: Normal)   Pulse 76   Ht 5\' 1"  (1.549 m)   Wt 155 lb 12 oz (70.6 kg)   LMP  (LMP Unknown)   BMI 29.43 kg/m   General:  Overweight woman, seated comfortably in the exam room. She is accompanied by her daughter. HEENT: No conjunctival pallor or scleral icterus. Moist mucous membranes. OP clear. Poor dentition. Neck: Supple without lymphadenopathy, thyromegaly, JVD, or HJR. No carotid bruit. Lungs: Normal work of breathing. Coarse breath sounds bilaterally with fair air movement. No wheezes or crackles. Heart: Regular rate and rhythm with 2/6 holosystolic murmur loudest at the LLSB. S4 is present. No rubs. Non-displaced PMI. Abd: Bowel sounds present. Soft, NT/ND without hepatosplenomegaly Ext: No lower extremity edema. Radial, PT, and DP pulses are 2+ bilaterally Skin: Warm and dry without rash. Neuro: CNIII-XII intact. Strength and fine-touch sensation intact in upper and lower extremities bilaterally. Psych: Normal mood and affect.  EKG:  Normal sinus rhythm and low voltage with baseline artifact.  Lab Results  Component Value Date   WBC 9.3 04/13/2017   HGB 10.8 (L) 04/13/2017   HCT 36.3 04/13/2017   MCV 86 04/13/2017   PLT 222 04/13/2017    Lab Results  Component Value Date   NA 138 05/26/2017   K 4.7 05/26/2017   CL 102 05/26/2017   CO2 21 05/26/2017   BUN 22 05/26/2017   CREATININE 1.25 (H) 05/26/2017   GLUCOSE 109 (H) 05/26/2017   ALT 8 05/26/2017    Lab Results  Component Value Date    CHOL 198 04/13/2017   HDL 62 04/13/2017   LDLCALC 108 (H) 04/13/2017   TRIG 139 04/13/2017   --------------------------------------------------------------------------------------------------  ASSESSMENT AND PLAN: Shortness of breath Dyspnea is likely multifactorial with some component of COPD. I have personally reviewed Ms. Lloyds echo and chest CT images. Given findings of PA enlargement and coronary artery calcification on chest CT, pulmonary hypertension and/or CAD could also be causing her symptoms. We have agreed to perform a pharmacologic myocardial perfusion stress test to evaluate for significant ischemia. If the study is low risk, we will proceed with right heart catheterization to assess for pulmonary hypertension. If there is evidence of ischemia or prior infarct by MPI, we will perform left and right heart catheterization with coronary angiography. I have reviewed the risks, indications, and alternatives to cardiac catheterization, possible angioplasty, and stenting with the patient. Risks include but are not limited to bleeding, infection, vascular injury, stroke, myocardial infection, arrhythmia, kidney injury, radiation-related injury in the case of prolonged fluoroscopy use, emergency cardiac surgery, and death. The patient understands the risks of serious complication is 1-2 in 1610 with diagnostic cardiac cath and 1-2% or less with angioplasty/stenting.  Coronary artery calcification As above, coronary  artery calcification suggests some degree of CAD that could be contributing to Ms. Lloyds dyspnea. We will proceed with stress test as above. I recommend continuation of aspirin and atorvastatin. Based on results of stress test +/- coronary angiography, atorvastatin may need to be escalated.  Heart murmur Soft systolic murmur noted. No significant valvular disease noted on recent echo. Murmur may represent trace tricuspid regurgitation or some degree of intracavitary gradient,  given mild to moderate LVH. No further work-up at this time.  Hypertension Blood pressure mildly elevated today but better at home per patient's report. She should continue her current medications and follow-up with Ms. Wicker.  Follow-up: Return to clinic in 6 weeks.  Nelva Bush, MD 06/17/2017 3:36 PM

## 2017-07-14 NOTE — Discharge Instructions (Signed)

## 2017-07-29 ENCOUNTER — Ambulatory Visit: Payer: Self-pay | Admitting: Internal Medicine

## 2017-08-20 ENCOUNTER — Other Ambulatory Visit: Payer: Self-pay | Admitting: Unknown Physician Specialty

## 2017-08-20 DIAGNOSIS — I10 Essential (primary) hypertension: Secondary | ICD-10-CM

## 2017-08-25 ENCOUNTER — Telehealth: Payer: Self-pay | Admitting: Pulmonary Disease

## 2017-08-25 NOTE — Telephone Encounter (Signed)
Pt still needs PFT before she is seen.

## 2017-08-25 NOTE — Telephone Encounter (Signed)
Daughter calling to schedule an appt.  Wants to know if she still needs a pft before upcoming ov

## 2017-08-26 NOTE — Telephone Encounter (Signed)
LMOAM for pt's daughter to return my call about PFT appointment and to obtain instructions for test. Madison Franklin

## 2017-08-26 NOTE — Telephone Encounter (Signed)
Please schedule prior to 11/16 if available

## 2017-08-26 NOTE — Telephone Encounter (Signed)
The only PFT available prior to appointment is Thursday 09/03/17 at 11:30 at [facility].   ATC pt's daughter and LMOAM for daughter to return my call in regards to this test. Madison Franklin

## 2017-08-28 NOTE — Telephone Encounter (Signed)
Pt's daughter called and scheduled PFT and ROV.  Instructions and appointment information mailed to patient. Nothing else needed at this time. Rhonda J Cobb

## 2017-09-03 ENCOUNTER — Ambulatory Visit: Payer: Medicare Other

## 2017-09-04 ENCOUNTER — Ambulatory Visit: Payer: Medicare Other | Admitting: Pulmonary Disease

## 2017-09-24 ENCOUNTER — Ambulatory Visit: Payer: Medicare Other

## 2017-09-28 ENCOUNTER — Ambulatory Visit: Payer: Medicare Other | Admitting: Pulmonary Disease

## 2017-09-30 ENCOUNTER — Ambulatory Visit (INDEPENDENT_AMBULATORY_CARE_PROVIDER_SITE_OTHER): Payer: Medicare Other

## 2017-09-30 VITALS — BP 130/64 | HR 87 | Temp 97.9°F | Resp 16 | Ht 63.0 in | Wt 159.9 lb

## 2017-09-30 DIAGNOSIS — Z0000 Encounter for general adult medical examination without abnormal findings: Secondary | ICD-10-CM

## 2017-09-30 NOTE — Patient Instructions (Addendum)
Madison Franklin , Thank you for taking time to come for your Medicare Wellness Visit. I appreciate your ongoing commitment to your health goals. Please review the following plan we discussed and let me know if I can assist you in the future.   Screening recommendations/referrals: Colonoscopy: completed 11/14/2016 Mammogram:Please call [phone] to schedule your mammogram.  Bone Density: completed 10/01/2014 Recommended yearly ophthalmology/optometry visit for glaucoma screening and checkup Recommended yearly dental visit for hygiene and checkup  Vaccinations: Influenza vaccine: completed 08/14/2017 Pneumococcal vaccine: up to date Tdap vaccine: up to date Shingles vaccine: due, check with your insurance company for coverage   Advanced directives: Advance directive discussed with you today. I have provided a copy for you to complete at home and have notarized. Once this is complete please bring a copy in to our office so we can scan it into your chart.  Conditions/risks identified: Recommend drinking at least 6-8 glasses of water a day  Next appointment: Follow up on 10/16/2017 at 10:00am with Regino Schultze. Follow up in one year for your annual wellness exam.   Preventive Care 72 Years and Older, Female Preventive care refers to lifestyle choices and visits with your health care provider that can promote health and wellness. What does preventive care include?  A yearly physical exam. This is also called an annual well check.  Dental exams once or twice a year.  Routine eye exams. Ask your health care provider how often you should have your eyes checked.  Personal lifestyle choices, including:  Daily care of your teeth and gums.  Regular physical activity.  Eating a healthy diet.  Avoiding tobacco and drug use.  Limiting alcohol use.  Practicing safe sex.  Taking low-dose aspirin every day.  Taking vitamin and mineral supplements as recommended by your health care  provider. What happens during an annual well check? The services and screenings done by your health care provider during your annual well check will depend on your age, overall health, lifestyle risk factors, and family history of disease. Counseling  Your health care provider may ask you questions about your:  Alcohol use.  Tobacco use.  Drug use.  Emotional well-being.  Home and relationship well-being.  Sexual activity.  Eating habits.  History of falls.  Memory and ability to understand (cognition).  Work and work Statistician.  Reproductive health. Screening  You may have the following tests or measurements:  Height, weight, and BMI.  Blood pressure.  Lipid and cholesterol levels. These may be checked every 5 years, or more frequently if you are over 20 years old.  Skin check.  Lung cancer screening. You may have this screening every year starting at age 72 if you have a 30-pack-year history of smoking and currently smoke or have quit within the past 15 years.  Fecal occult blood test (FOBT) of the stool. You may have this test every year starting at age 72.  Flexible sigmoidoscopy or colonoscopy. You may have a sigmoidoscopy every 5 years or a colonoscopy every 10 years starting at age 72.  Hepatitis C blood test.  Hepatitis B blood test.  Sexually transmitted disease (STD) testing.  Diabetes screening. This is done by checking your blood sugar (glucose) after you have not eaten for a while (fasting). You may have this done every 1-3 years.  Bone density scan. This is done to screen for osteoporosis. You may have this done starting at age 72.  Mammogram. 72 may be done every 1-2 years. Talk to your health  care provider about how often you should have regular mammograms. Talk with your health care provider about your test results, treatment options, and if necessary, the need for more tests. Vaccines  Your health care provider may recommend certain  vaccines, such as:  Influenza vaccine. This is recommended every year.  Tetanus, diphtheria, and acellular pertussis (Tdap, Td) vaccine. You may need a Td booster every 10 years.  Zoster vaccine. You may need this after age 72.  Pneumococcal 13-valent conjugate (PCV13) vaccine. One dose is recommended after age 72.  Pneumococcal polysaccharide (PPSV23) vaccine. One dose is recommended after age 72. Talk to your health care provider about which screenings and vaccines you need and how often you need them. This information is not intended to replace advice given to you by your health care provider. Make sure you discuss any questions you have with your health care provider. Document Released: 11/02/2015 Document Revised: 06/25/2016 Document Reviewed: 08/07/2015 Elsevier Interactive Patient Education  2017 [place] Prevention in the Home Falls can cause injuries. They can happen to people of all ages. There are many things you can do to make your home safe and to help prevent falls. What can I do on the outside of my home?  Regularly fix the edges of walkways and driveways and fix any cracks.  Remove anything that might make you trip as you walk through a door, such as a raised step or threshold.  Trim any bushes or trees on the path to your home.  Use bright outdoor lighting.  Clear any walking paths of anything that might make someone trip, such as rocks or tools.  Regularly check to see if handrails are loose or broken. Make sure that both sides of any steps have handrails.  Any raised decks and porches should have guardrails on the edges.  Have any leaves, snow, or ice cleared regularly.  Use sand or salt on walking paths during winter.  Clean up any spills in your garage right away. This includes oil or grease spills. What can I do in the bathroom?  Use night lights.  Install grab bars by the toilet and in the tub and shower. Do not use towel bars as grab  bars.  Use non-skid mats or decals in the tub or shower.  If you need to sit down in the shower, use a plastic, non-slip stool.  Keep the floor dry. Clean up any water that spills on the floor as soon as it happens.  Remove soap buildup in the tub or shower regularly.  Attach bath mats securely with double-sided non-slip rug tape.  Do not have throw rugs and other things on the floor that can make you trip. What can I do in the bedroom?  Use night lights.  Make sure that you have a light by your bed that is easy to reach.  Do not use any sheets or blankets that are too big for your bed. They should not hang down onto the floor.  Have a firm chair that has side arms. You can use this for support while you get dressed.  Do not have throw rugs and other things on the floor that can make you trip. What can I do in the kitchen?  Clean up any spills right away.  Avoid walking on wet floors.  Keep items that you use a lot in easy-to-reach places.  If you need to reach something above you, use a strong step stool that has a grab  bar.  Keep electrical cords out of the way.  Do not use floor polish or wax that makes floors slippery. If you must use wax, use non-skid floor wax.  Do not have throw rugs and other things on the floor that can make you trip. What can I do with my stairs?  Do not leave any items on the stairs.  Make sure that there are handrails on both sides of the stairs and use them. Fix handrails that are broken or loose. Make sure that handrails are as long as the stairways.  Check any carpeting to make sure that it is firmly attached to the stairs. Fix any carpet that is loose or worn.  Avoid having throw rugs at the top or bottom of the stairs. If you do have throw rugs, attach them to the floor with carpet tape.  Make sure that you have a light switch at the top of the stairs and the bottom of the stairs. If you do not have them, ask someone to add them for  you. What else can I do to help prevent falls?  Wear shoes that:  Do not have high heels.  Have rubber bottoms.  Are comfortable and fit you well.  Are closed at the toe. Do not wear sandals.  If you use a stepladder:  Make sure that it is fully opened. Do not climb a closed stepladder.  Make sure that both sides of the stepladder are locked into place.  Ask someone to hold it for you, if possible.  Clearly mark and make sure that you can see:  Any grab bars or handrails.  First and last steps.  Where the edge of each step is.  Use tools that help you move around (mobility aids) if they are needed. These include:  Canes.  Walkers.  Scooters.  Crutches.  Turn on the lights when you go into a dark area. Replace any light bulbs as soon as they burn out.  Set up your furniture so you have a clear path. Avoid moving your furniture around.  If any of your floors are uneven, fix them.  If there are any pets around you, be aware of where they are.  Review your medicines with your doctor. Some medicines can make you feel dizzy. This can increase your chance of falling. Ask your doctor what other things that you can do to help prevent falls. This information is not intended to replace advice given to you by your health care provider. Make sure you discuss any questions you have with your health care provider. Document Released: 08/02/2009 Document Revised: 03/13/2016 Document Reviewed: 11/10/2014 Elsevier Interactive Patient Education  2017 [place] provider would like to you have your annual eye exam. Please contact your current eye doctor or here are some good options for you to contact.   [facility] Address: [address] [place], [place] [address]   Address: [address], [place], [place] [address]  Phone: [phone]      Phone: [phone]  Website: [email or web address]    Website: [email or web address]      [facility]  Address: [address], [place], [place] [address]   Address: [place], [place],  [address] Phone: [phone]      Phone: [phone]    [facility] Address: [place],  [place] [address]  Phone: [phone]

## 2017-09-30 NOTE — Progress Notes (Signed)
Subjective:   Madison Franklin is a 71 y.o. female who presents for Medicare Annual (Subsequent) preventive examination.  Review of Systems:   Cardiac Risk Factors include: hypertension;advanced age (>33men, >33 women)     Objective:     Vitals: BP 130/64 (BP Location: Left Arm, Cuff Size: Normal) Comment: recheck   Pulse 87   Temp 97.9 F (36.6 C) (Oral)   Resp 16   Ht 5\' 3"  (1.6 m)   Wt 159 lb 14.4 oz (72.5 kg)   LMP  (LMP Unknown)   BMI 28.33 kg/m   Body mass index is 28.33 kg/m.  Advanced Directives 09/30/2017 07/14/2017 11/14/2016 07/27/2015  Does Patient Have a Medical Advance Directive? No Yes No No  Does patient want to make changes to medical advance directive? - Yes (MAU/Ambulatory/Procedural Areas - Information given) - -  Would patient like information on creating a medical advance directive? Yes (MAU/Ambulatory/Procedural Areas - Information given) - No - Patient declined -    Tobacco Social History   Tobacco Use  Smoking Status Former Smoker  . Packs/day: 0.25  . Years: 30.00  . Pack years: 7.50  . Types: Cigarettes  . Last attempt to quit: 01/17/2017  . Years since quitting: 0.7  Smokeless Tobacco Never Used     Counseling given: Not Answered   Clinical Intake:  Pre-visit preparation completed: Yes  Pain : 0-10 Pain Score: 0-No pain     Diabetes: No  Activities of Daily Living: Independent Ambulation: Independent with device- listed below Home Assistive Devices/Equipment: Other (Comment)(hearing aids) Medication Administration: Independent Home Management: Independent  Barriers to Care Management & Learning: None  Unable to ask?: Yes  How often do you need to have someone help you when you read instructions, pamphlets, or other written materials from your doctor or pharmacy?: 1 - Never What is the last grade level you completed in school?: 12th grade  Interpreter Needed?: No  Information entered by :: Namita Yearwood,LPN   Past Medical  History:  Diagnosis Date  . COPD (chronic obstructive pulmonary disease) ([place])   . Hearing aid worn    bilateral  . Hyperlipidemia   . Hypertension   . Osteoporosis   . Personal history of tobacco use, presenting hazards to health 08/15/2015   Past Surgical History:  Procedure Laterality Date  . ABDOMINAL HYSTERECTOMY    . COLONOSCOPY WITH PROPOFOL N/A 11/14/2016   Procedure: COLONOSCOPY WITH PROPOFOL;  Surgeon: [name], MD;  Location: [facility];  Service: Endoscopy;  Laterality: N/A;  . long nodule    . POLYPECTOMY  11/14/2016   Procedure: POLYPECTOMY;  Surgeon: [name], MD;  Location: [facility];  Service: Endoscopy;;  . RIGHT HEART CATH Right 07/14/2017   Procedure: RIGHT HEART CATH;  Surgeon: Nelva Bush, MD;  Location: [facility];  Service: Cardiovascular;  Laterality: Right;   Family History  Problem Relation Age of Onset  . Heart disease Neg Hx    Social History   Socioeconomic History  . Marital status: Married    Spouse name: None  . Number of children: None  . Years of education: 29  . Highest education level: 12th grade  Social Needs  . Financial resource strain: Hard  . Food insecurity - worry: Never true  . Food insecurity - inability: Never true  . Transportation needs - medical: No  . Transportation needs - non-medical: No  Occupational History  . None  Tobacco Use  . Smoking status: Former Smoker  Packs/day: 0.25    Years: 30.00    Pack years: 7.50    Types: Cigarettes    Last attempt to quit: 01/17/2017    Years since quitting: 0.7  . Smokeless tobacco: Never Used  Substance and Sexual Activity  . Alcohol use: No    Alcohol/week: 0.0 oz  . Drug use: No  . Sexual activity: Yes  Other Topics Concern  . None  Social History Narrative  . None    Outpatient Encounter Medications as of 09/30/2017  Medication Sig  . acetaminophen (TYLENOL) 325 MG tablet Take 325 mg by mouth every 6 (six) hours as needed  (for pain.).  Marland Kitchen amLODipine (NORVASC) 5 MG tablet Take 1 tablet (5 mg total) by mouth daily.  Marland Kitchen aspirin EC 81 MG tablet Take 81 mg by mouth daily.  Marland Kitchen atorvastatin (LIPITOR) 10 MG tablet Take 1 tablet (10 mg total) by mouth daily.  . budesonide-formoterol (SYMBICORT) 160-4.5 MCG/ACT inhaler Inhale 2 puffs into the lungs 2 (two) times daily.  . hydrochlorothiazide (MICROZIDE) 12.5 MG capsule Take 1 capsule (12.5 mg total) by mouth daily.  Marland Kitchen lisinopril (PRINIVIL,ZESTRIL) 40 MG tablet Take 1 tablet (40 mg total) by mouth daily.   No facility-administered encounter medications on file as of 09/30/2017.     Activities of Daily Living In your present state of health, do you have any difficulty performing the following activities: 09/30/2017 11/14/2016  Hearing? Y N  Comment hearing aids bilaterally -  Vision? N N  Difficulty concentrating or making decisions? N N  Walking or climbing stairs? N N  Dressing or bathing? N N  Doing errands, shopping? N -  Preparing Food and eating ? N -  Using the Toilet? N -  In the past six months, have you accidently leaked urine? N -  Do you have problems with loss of bowel control? N -  Managing your Medications? N -  Managing your Finances? N -  Housekeeping or managing your Housekeeping? Y -  Comment husband assists -  Some recent data might be hidden    Timed Get Up and Go performed: completed in 10 seconds with no use of assistive devices, gait steady. No interventions needed at this time.   Patient Care Team: [name], NP as PCP - General (Nurse Practitioner)    Assessment:    Exercise Activities and Dietary recommendations Current Exercise Habits: The patient does not participate in regular exercise at present, Exercise limited by: None identified  Goals    . DIET - INCREASE WATER INTAKE     Recommend drinking at least 6-8 glasses of water a day      Fall Risk Fall Risk  09/30/2017 09/29/2016 07/27/2015  Falls in the past year? No  No No   Is the patient's home free of loose throw rugs in walkways, pet beds, electrical cords, etc?   yes      Grab bars in the bathroom? no      Handrails on the stairs?   yes      Adequate lighting?   yes  Depression Screen PHQ 2/9 Scores 09/30/2017 09/29/2016 07/27/2015  PHQ - 2 Score 0 0 1     Cognitive Function     6CIT Screen 09/30/2017  What Year? 0 points  What month? 0 points  What time? 0 points  Count back from 20 0 points  Months in reverse 0 points  Repeat phrase 4 points  Total Score 4    Immunization History  Administered Date(s) Administered  . Influenza, High Dose Seasonal PF 09/03/2016  . Influenza,inj,Quad PF,6+ Mos 07/27/2015  . Influenza-Unspecified 08/14/2017  . Pneumococcal Conjugate-13 01/12/2015  . Pneumococcal Polysaccharide-23 07/01/2011  . Td 07/27/2015   Screening Tests Health Maintenance  Topic Date Due  . MAMMOGRAM  11/22/1994  . COLONOSCOPY  11/14/2017  . TETANUS/TDAP  07/26/2025  . INFLUENZA VACCINE  Completed  . DEXA SCAN  Completed  . Hepatitis C Screening  Completed  . PNA vac Low Risk Adult  Completed   Cancer Screenings: Lung: Low Dose CT Chest recommended if Age 30-80 years, 30 pack-year currently smoking OR have quit w/in 15years. Patient does qualify. Has an appointment scheudled Breast:  Up to date on Mammogram? Yes  Up to date of Bone Density/Dexa? Yes Colorectal: completed 11/14/2016  Additional Screenings:  Hepatitis B/HIV/Syphillis: not indicated  Hepatitis C Screening: completed 08/06/2015     Plan:    I have personally reviewed and addressed the Medicare Annual Wellness questionnaire and have noted the following in the patient's chart:  A. Medical and social history B. Use of alcohol, tobacco or illicit drugs  C. Current medications and supplements D. Functional ability and status E.  Nutritional status F.  Physical activity G. Advance directives H. List of other physicians I.  Hospitalizations, surgeries,  and ER visits in previous 12 months J.  [place] such as hearing and vision if needed, cognitive and depression L. Referrals and appointments   In addition, I have reviewed and discussed with patient certain preventive protocols, quality metrics, and best practice recommendations. A written personalized care plan for preventive services as well as general preventive health recommendations were provided to patient.   Signed,  Tyler Aas, LPN Nurse Health Advisor   Nurse Notes:none

## 2017-10-01 ENCOUNTER — Ambulatory Visit: Payer: Medicare Other | Attending: Pulmonary Disease

## 2017-10-01 DIAGNOSIS — J449 Chronic obstructive pulmonary disease, unspecified: Secondary | ICD-10-CM | POA: Insufficient documentation

## 2017-10-01 DIAGNOSIS — R0609 Other forms of dyspnea: Secondary | ICD-10-CM | POA: Insufficient documentation

## 2017-10-01 MED ORDER — ALBUTEROL SULFATE (2.5 MG/3ML) 0.083% IN NEBU
2.5000 mg | INHALATION_SOLUTION | Freq: Once | RESPIRATORY_TRACT | Status: AC
  Administered 2017-10-01: 2.5 mg via RESPIRATORY_TRACT
  Filled 2017-10-01: qty 3

## 2017-10-02 ENCOUNTER — Ambulatory Visit (INDEPENDENT_AMBULATORY_CARE_PROVIDER_SITE_OTHER): Payer: Medicare Other | Admitting: Pulmonary Disease

## 2017-10-02 ENCOUNTER — Encounter: Payer: Self-pay | Admitting: Pulmonary Disease

## 2017-10-02 VITALS — BP 142/82 | HR 90 | Ht 63.0 in | Wt 159.0 lb

## 2017-10-02 DIAGNOSIS — J432 Centrilobular emphysema: Secondary | ICD-10-CM | POA: Diagnosis not present

## 2017-10-02 DIAGNOSIS — I272 Pulmonary hypertension, unspecified: Secondary | ICD-10-CM | POA: Diagnosis not present

## 2017-10-02 DIAGNOSIS — M4120 Other idiopathic scoliosis, site unspecified: Secondary | ICD-10-CM

## 2017-10-02 DIAGNOSIS — R942 Abnormal results of pulmonary function studies: Secondary | ICD-10-CM

## 2017-10-02 DIAGNOSIS — Z87891 Personal history of nicotine dependence: Secondary | ICD-10-CM | POA: Diagnosis not present

## 2017-10-02 DIAGNOSIS — J449 Chronic obstructive pulmonary disease, unspecified: Secondary | ICD-10-CM

## 2017-10-02 DIAGNOSIS — R0609 Other forms of dyspnea: Secondary | ICD-10-CM

## 2017-10-02 DIAGNOSIS — R0902 Hypoxemia: Secondary | ICD-10-CM

## 2017-10-02 NOTE — Patient Instructions (Signed)
Continue Symbicort inhaler-2 inhalations twice daily.  Rinse mouth after use Ambulatory and overnight oximetry ordered Follow-up in 8 weeks

## 2017-10-05 ENCOUNTER — Other Ambulatory Visit: Payer: Self-pay | Admitting: *Deleted

## 2017-10-05 NOTE — Patient Outreach (Signed)
[name] Care Management  10/05/2017  Madison Franklin [birth year] [id number]   Telephone Screen  Referral Date: 10/01/17 Referral Source: MD office ([place]) Referral Reason: Patient is in need of home visit by [facility] SW and Nurse, has significant financial needs, needs grab bars installed in shower, needs assistance around the house, medication management, Care Giver/Community Support Insurance: [facility] Medicare   Outreach attempt # 1 to patient. HIPAA verified with patient. Patient gave RN CM permission to speak with her spouse Madison Franklin) related to patient's hearing. Madison Franklin reported, patient needs assistance with obtaining DME and possibly a person care aide. Madison Franklin reported, patient is having difficulty with managing her ADL's. Per Madison Franklin, patient can only take sponge baths due to her shortness of breath. Madison Franklin stated, he is prescribed Oxygen. He reported, patient utilizes his Oxygen, occasionally due to her shortness of breath. Patient has a history of HTN, HLD, Scoliosis, COPD, and a heart murmur. Spouse's answers to screening questions and MD referral doesn't correlate together. Patient has a scheduled primary MD appointment on 10/16/17. [facility] services and benefits explained to patient and spouse. Spouse agreed to services.   Plan: RN CM will send [facility] SW referral for possible assistance with community resources related to DME, safety in the home, and personal care services.   Lake Bells, RN, BSN, MHA/MSL, [place] Telephonic Care Manager Coordinator [facility] Direct Phone: [phone] Toll Free: [phone] Fax: [phone]

## 2017-10-05 NOTE — Progress Notes (Signed)
PULMONARY OFFICE FOLLOW-UP NOTE  Requesting MD/Service: [name], NP Date of initial consultation: 05/11/17 Reason for consultation: Former smoker, exertional dyspnea  PT PROFILE: 71 y.o. female former (light) smoker referred for evaluation of exertional dyspnea  DATA: CT chest 03/13/17: Severe thoracic kyphoscoliosis. 4 mm nodule in left upper lobe (decreased in size from previously). Minimal emphysema Echocardiogram 05/28/17: LVEF 60-65%.  Grade 1 diastolic dysfunction.  No significant valvular disease noted.  No evidence of right-sided overload. RNC 07/14/17: Mild to moderate pulmonary hypertension (mean PA P 34 mmHg, PVR 5.7 Wood units) PFTs 10/01/17: FVC: 1.28 L (49 %pred), FEV1: 0.83 > 0.91 L (41 > 45 %pred), FEV1/FVC: 65%; lung volume measurements invalid, DLCO 6.6 (31 %pred)  SUBJ:  History is again limited by extreme hearing deficit.  No new complaints are reported.  She is here to review results of PFTs and echocardiogram noted above. Denies CP, fever, purulent sputum, hemoptysis, LE edema and calf tenderness.  Since last visit, she has also undergone RHC to assess for pulmonary hypertension.  Vitals:   10/02/17 1104 10/02/17 1109  BP:  (!) 142/82  Pulse:  90  SpO2:  90%  Weight: 72.1 kg (159 lb)   Height: 5\' 3"  (1.6 m)      EXAM:  Gen: NAD HEENT: NCAT, sclera white Neck: Supple without LAN, JVD Chest/Lungs: Severe thoracic scoliosis, breath sounds diffusely diminished without wheezes or other adventitious sounds Cardiovascular: Regular, II-III/VI systolic murmur radiating towards neck Abdomen: Soft, nontender, normal BS Ext: without clubbing, cyanosis, edema Neuro: CNs grossly intact, motor and sensory intact Skin: Limited exam, no lesions noted  DATA:   BMP Latest Ref Rng & Units 07/07/2017 05/26/2017 04/13/2017  Glucose 65 - 99 mg/dL 116(H) 109(H) 86  BUN 6 - 20 mg/dL 29(H) 22 30(H)  Creatinine 0.44 - 1.00 mg/dL 1.24(H) 1.25(H) 1.33(H)  BUN/Creat Ratio 12 -  28 - 18 23  Sodium 135 - 145 mmol/L 137 138 135  Potassium 3.5 - 5.1 mmol/L 4.2 4.7 5.2  Chloride 101 - 111 mmol/L 106 102 101  CO2 22 - 32 mmol/L 23 21 17(L)  Calcium 8.9 - 10.3 mg/dL 9.6 9.6 9.4    CBC Latest Ref Rng & Units 07/07/2017 04/13/2017 09/29/2016  WBC 3.6 - 11.0 K/uL 9.7 9.3 10.5  Hemoglobin 12.0 - 16.0 g/dL 10.9(L) 10.8(L) 12.7  Hematocrit 35.0 - 47.0 % 33.5(L) 36.3 40.6  Platelets 150 - 440 K/uL 185 222 201    CXR:  No film available   IMPRESSION:     ICD-10-CM   1. Chronic obstructive pulmonary disease, unspecified COPD type (HCC) J44.9 6 minute walk  2. Former smoker Z87.891   3. DOE (dyspnea on exertion) R06.09 6 minute walk  4. Severe thoracic kyphoscoliosis M41.20   5. Centrilobular emphysema ([place]) J43.2   6. Mild to moderate pulmonary hypertension I27.20 Pulse oximetry, overnight  7. Borderline hypoxemia at rest R09.02 6 minute walk  8. Restrictive pattern present on PFTs- -likely due to severe kyphoscoliosis R94.2 Pulse oximetry, overnight   Her ventilatory defect is a combination of obstructive lung disease and apparently severe restriction (likely on the basis of severe kyphoscoliosis).  Mild to moderate pulmonary hypertension is likely due to her underlying pulmonary/ventilatory defect.  It does not warrant specific pulmonary vasodilator therapy.  However, it is noted that her oxygen saturations are borderline at rest during this encounter.  It is likely that she has exertional and/or nocturnal hypoxemia.  In light of pulmonary hypertension, this would warrant therapy if  proven to be present.  PLAN:  Continue Symbicort inhaler-2 inhalations twice daily.  Rinse mouth after use Ambulatory and overnight oximetry ordered Follow-up in 8 weeks   Merton Border, MD PCCM service Mobile [phone] Pager [phone] 10/05/2017 2:59 PM

## 2017-10-09 ENCOUNTER — Other Ambulatory Visit: Payer: Self-pay | Admitting: *Deleted

## 2017-10-09 NOTE — Patient Outreach (Signed)
[facility]) Care Management  10/09/2017  EVANN KOELZER 1946/07/22 [id number]   Patient referred to this social worker by telephonic RNCM to assist patient with in home personal care services and DME. Unsuccessful attempt #1 to reach patient. Voicemail message left for return call.   Sheralyn Boatman [facility] Care Management [phone]

## 2017-10-12 ENCOUNTER — Other Ambulatory Visit: Payer: Self-pay | Admitting: *Deleted

## 2017-10-12 NOTE — Patient Outreach (Addendum)
[facility]) Care Management  10/12/2017  Madison Franklin [birth year] [id number]    Patient referred to this social worker by telephonic RNCM to assist patient with in home personal care services and DME. Unsuccessful attempt #2 to reach patient. Patient's phone was busy X2. This Education officer, museum will try to reach patient again within 1 week.   Sheralyn Boatman [facility] Care Management [phone]

## 2017-10-15 ENCOUNTER — Ambulatory Visit (INDEPENDENT_AMBULATORY_CARE_PROVIDER_SITE_OTHER): Payer: Medicare Other | Admitting: *Deleted

## 2017-10-15 DIAGNOSIS — J449 Chronic obstructive pulmonary disease, unspecified: Secondary | ICD-10-CM

## 2017-10-15 NOTE — Patient Instructions (Signed)
Follow up previously scheduled. 

## 2017-10-15 NOTE — Progress Notes (Signed)
    SIX MIN WALK 10/15/2017  Medications Asa 81 mg;Amlodipine 5 mg/Atorvastatin 10 mg;HCTZ 12.5 mg; lisinopril 40 mg  Supplimental Oxygen during Test? (L/min) No  Laps 8  Partial Lap (in Meters) 1  Baseline BP (sitting) 136/70  Baseline Heartrate 90  Baseline Dyspnea (Borg Scale) 0  Baseline Fatigue (Borg Scale) 1  Baseline SPO2 97  BP (sitting) 168/70  Heartrate 59  Dyspnea (Borg Scale) 5  Fatigue (Borg Scale) 10  SPO2 86  BP (sitting) 150/64  Heartrate 87  SPO2 97  Stopped or Paused before Six Minutes Yes  Other Symptoms at end of Exercise Patient desat 86% during SMW. She c/o fatigue and some shortness of breath.  Distance Completed 385  Tech Comments: After desat 2 liter 02 given and patient 02 came up to 97%. Patient only complained of fatigue and a little shortness of breath.    SMW performed today.

## 2017-10-16 ENCOUNTER — Encounter: Payer: Medicare Other | Admitting: Unknown Physician Specialty

## 2017-10-19 ENCOUNTER — Encounter: Payer: Self-pay | Admitting: *Deleted

## 2017-10-19 ENCOUNTER — Other Ambulatory Visit: Payer: Self-pay | Admitting: *Deleted

## 2017-10-19 NOTE — Patient Outreach (Signed)
[facility]) Care Management  10/19/2017  Madison Franklin 09/10/1946 [id number]    Patient referred to this social worker by telephonic RNCM to assist patient with in home personal care services and DME. Unsuccessful attempt #3 to reach patient. Voicemail message left to return this social worker's call.   Plan: This Education officer, museum will send patient a Economist.   Sheralyn Boatman [facility] Care Management [phone]

## 2017-10-21 ENCOUNTER — Telehealth: Payer: Self-pay | Admitting: Pulmonary Disease

## 2017-10-21 NOTE — Telephone Encounter (Signed)
Returned call and let patient's ONO orders were faxed today and they should be hearing from company this week to set up.ss

## 2017-10-21 NOTE — Telephone Encounter (Signed)
Patient daughter Madison Franklin calling At last visit Dr Alva Garnet mentioned an at home breathing test he may want completed Patient has not yet heard if testing is still necessary Please call Madison Franklin to discuss

## 2017-10-26 ENCOUNTER — Encounter: Payer: Self-pay | Admitting: Pulmonary Disease

## 2017-10-26 DIAGNOSIS — R942 Abnormal results of pulmonary function studies: Secondary | ICD-10-CM

## 2017-10-26 DIAGNOSIS — I272 Pulmonary hypertension, unspecified: Secondary | ICD-10-CM

## 2017-10-28 DIAGNOSIS — J449 Chronic obstructive pulmonary disease, unspecified: Secondary | ICD-10-CM | POA: Diagnosis not present

## 2017-10-30 ENCOUNTER — Other Ambulatory Visit: Payer: Self-pay | Admitting: *Deleted

## 2017-10-30 NOTE — Patient Outreach (Signed)
[facility]) Care Management  10/30/2017  Madison Franklin [birth year] [id number]   Patient referred to this social worker to review resources for in home personal care services and DME. This social worker spoke to patient and her spouse who state that patient's sister comes by to assist with ADL's, however they would like more assistance as her condition progresses. Per patient's spouse, patient does not qualify for Medicaid.  This Education officer, museum provided patient and her spouse with the contact information for [employer] management program. [phone]. Patient and spouse informed of the possibility of a waiting list for in home care. Patient and spouse appreciative of the information provide and report having no other community resource needs at this time. Patient's spouse states that patient has a cane and a walker and made to request for assistance with DME. This social worker's contact information provided in the event that additional community resource needs arise in the future.   Sheralyn Boatman [facility] Care Management [phone]

## 2017-11-09 ENCOUNTER — Telehealth: Payer: Self-pay | Admitting: *Deleted

## 2017-11-09 NOTE — Telephone Encounter (Signed)
Dr. Alva Garnet reviewed SMW and ONO: Recommendation: It is not absolutely necessary that patient be on 02 but if she wants to try to wean: start 02 2 Liters to be worn with exertion.

## 2017-11-10 NOTE — Telephone Encounter (Signed)
2nd message left.

## 2017-11-10 NOTE — Telephone Encounter (Signed)
Results given to patient's daughter Clint Bolder. She will let patient know and call back with decision. Nothing further needed.

## 2017-11-13 NOTE — Telephone Encounter (Signed)
Pt other daughter Levada Dy calling  Stating they received results on exams patient did and was told patient would need oxygen   Would like to know more about this and what steps are taken to make this happen  Please call back

## 2017-11-13 NOTE — Telephone Encounter (Signed)
Message left for patient's daughter to return call.

## 2017-11-16 ENCOUNTER — Encounter: Payer: Medicare Other | Admitting: Unknown Physician Specialty

## 2017-11-16 NOTE — Telephone Encounter (Signed)
Left another message.ss

## 2017-11-17 NOTE — Telephone Encounter (Signed)
Spoke with daughter Levada Dy and advised per Dr. Alva Garnet patient does not need 02. She will relay message to her mother. Nothing further needed.

## 2017-11-19 ENCOUNTER — Telehealth: Payer: Self-pay | Admitting: Pulmonary Disease

## 2017-11-19 ENCOUNTER — Other Ambulatory Visit: Payer: Self-pay | Admitting: Unknown Physician Specialty

## 2017-11-19 DIAGNOSIS — I10 Essential (primary) hypertension: Secondary | ICD-10-CM

## 2017-11-19 DIAGNOSIS — J449 Chronic obstructive pulmonary disease, unspecified: Secondary | ICD-10-CM

## 2017-11-19 DIAGNOSIS — F17200 Nicotine dependence, unspecified, uncomplicated: Secondary | ICD-10-CM

## 2017-11-19 NOTE — Telephone Encounter (Signed)
Pt daughter calling asking if we can call back  They are getting mixed information   They are confused, patient was advised to start on oxygen but then they got a call from our office yesterday stating pt is not in need of that  Would like a call back to make sure what patient would need to be on

## 2017-11-19 NOTE — Telephone Encounter (Signed)
Symbicort,Norvasc,Lipitor refill Last OV: 04/13/17 Last Refill:01/05/17 Pharmacy:[facility]

## 2017-11-20 ENCOUNTER — Telehealth: Payer: Self-pay | Admitting: Unknown Physician Specialty

## 2017-11-20 DIAGNOSIS — I10 Essential (primary) hypertension: Secondary | ICD-10-CM

## 2017-11-20 DIAGNOSIS — F17200 Nicotine dependence, unspecified, uncomplicated: Secondary | ICD-10-CM

## 2017-11-20 MED ORDER — HYDROCHLOROTHIAZIDE 12.5 MG PO CAPS: 12.5000 mg | ORAL_CAPSULE | Freq: Every day | ORAL | 1 refills | Status: DC

## 2017-11-20 MED ORDER — AMLODIPINE BESYLATE 5 MG PO TABS: 5.0000 mg | ORAL_TABLET | Freq: Every day | ORAL | 1 refills | Status: DC

## 2017-11-20 MED ORDER — ATORVASTATIN CALCIUM 10 MG PO TABS: 10.0000 mg | ORAL_TABLET | Freq: Every day | ORAL | 1 refills | Status: DC

## 2017-11-20 MED ORDER — LISINOPRIL 40 MG PO TABS: 40.0000 mg | ORAL_TABLET | Freq: Every day | ORAL | 0 refills | Status: DC

## 2017-11-20 NOTE — Telephone Encounter (Signed)
Spoke with patient daughter. They would like 02 ordered for prn. Patient is still very sob with exertion. An order has been placed.  According to SMW patient did qualify. ONO patient did not need night-time. Dr.Simonds said patient may have 02 for exertion if she prefers.

## 2017-11-20 NOTE — Telephone Encounter (Signed)
Patient is completely out of her bp meds and I scheduled fu appt  2/5 @ 9:45   Can Malachy Mood call in [place] enough to get her through until her appt on the following  norvasc lipitor symbicort  Thanks

## 2017-11-20 NOTE — Telephone Encounter (Signed)
Left message for patient to call to schedule a follow up appointment in order to get refills on her medication with [name]

## 2017-11-24 ENCOUNTER — Ambulatory Visit (INDEPENDENT_AMBULATORY_CARE_PROVIDER_SITE_OTHER): Payer: Medicare Other | Admitting: Unknown Physician Specialty

## 2017-11-24 ENCOUNTER — Encounter: Payer: Self-pay | Admitting: Unknown Physician Specialty

## 2017-11-24 VITALS — BP 169/74 | HR 82 | Temp 98.3°F | Wt 157.3 lb

## 2017-11-24 DIAGNOSIS — E782 Mixed hyperlipidemia: Secondary | ICD-10-CM | POA: Diagnosis not present

## 2017-11-24 DIAGNOSIS — J449 Chronic obstructive pulmonary disease, unspecified: Secondary | ICD-10-CM | POA: Diagnosis not present

## 2017-11-24 DIAGNOSIS — D649 Anemia, unspecified: Secondary | ICD-10-CM

## 2017-11-24 DIAGNOSIS — N183 Chronic kidney disease, stage 3 unspecified: Secondary | ICD-10-CM

## 2017-11-24 DIAGNOSIS — I10 Essential (primary) hypertension: Secondary | ICD-10-CM

## 2017-11-24 MED ORDER — ALBUTEROL SULFATE HFA 108 (90 BASE) MCG/ACT IN AERS: 2.0000 | INHALATION_SPRAY | Freq: Four times a day (QID) | RESPIRATORY_TRACT | 2 refills | Status: DC | PRN

## 2017-11-24 MED ORDER — TIOTROPIUM BROMIDE MONOHYDRATE 18 MCG IN CAPS: 18.0000 ug | ORAL_CAPSULE | Freq: Every day | RESPIRATORY_TRACT | 3 refills | Status: DC

## 2017-11-24 NOTE — Assessment & Plan Note (Signed)
This is chronic but stable.  Will order anemia panel to characterize cause.

## 2017-11-24 NOTE — Assessment & Plan Note (Signed)
Change from Symbicort to [facility] due to cost.  Rx fro Albuterol

## 2017-11-24 NOTE — Progress Notes (Signed)
BP (!) 169/74   Pulse 82   Temp 98.3 F (36.8 C) (Oral)   Wt 157 lb 4.8 oz (71.4 kg)   LMP  (LMP Unknown)   SpO2 96%   BMI 27.86 kg/m    Subjective:    Patient ID: Madison Franklin, female    DOB: Mar 22, 1946, 72 y.o.   MRN: [id number]  HPI: HANADI STANLY is a 72 y.o. female  Chief Complaint  Patient presents with  . Hyperlipidemia  . Hypertension   Hypertension Using medications without difficulty Average home BPs: SBP 138 at home.  Always high here   No problems or lightheadedness No chest pain with exertion or shortness of breath No Edema   Hyperlipidemia Using medications without problems: No Muscle aches  Diet compliance: Exercise:  COPD Feels symptoms are well controlled but unable to afford Symbicort so she is less acitve.  She quit smoking 11 months ago Using medications without problems: Night time symptoms: none ER visits since last visit:none Missed work or school:: N/A Increased cough: No Increased SOB: no Using O2: no  Anemia Pt's husband states she sleeps a lot.  She claims she is not tired.    Relevant past medical, surgical, family and social history reviewed and updated as indicated. Interim medical history since our last visit reviewed. Allergies and medications reviewed and updated.  Review of Systems  Constitutional: Positive for fatigue.  Cardiovascular: Negative.   Gastrointestinal: Negative.   Genitourinary: Negative.     Per HPI unless specifically indicated above     Objective:    BP (!) 169/74   Pulse 82   Temp 98.3 F (36.8 C) (Oral)   Wt 157 lb 4.8 oz (71.4 kg)   LMP  (LMP Unknown)   SpO2 96%   BMI 27.86 kg/m   Wt Readings from Last 3 Encounters:  11/24/17 157 lb 4.8 oz (71.4 kg)  10/02/17 159 lb (72.1 kg)  09/30/17 159 lb 14.4 oz (72.5 kg)    Physical Exam  Constitutional: She is oriented to person, place, and time. She appears well-developed and well-nourished. No distress.  HENT:  Head: Normocephalic and  atraumatic.  Eyes: Conjunctivae and lids are normal. Right eye exhibits no discharge. Left eye exhibits no discharge. No scleral icterus.  Neck: Normal range of motion. Neck supple. No JVD present. Carotid bruit is not present.  Cardiovascular: Normal rate and regular rhythm.  Murmur heard.  Systolic murmur is present with a grade of 2/6. Pulmonary/Chest: Effort normal. She has rhonchi in the right lower field and the left lower field.  Abdominal: Normal appearance. There is no splenomegaly or hepatomegaly.  Musculoskeletal: Normal range of motion.  Neurological: She is alert and oriented to person, place, and time.  Skin: Skin is warm, dry and intact. No rash noted. No pallor.  Psychiatric: She has a normal mood and affect. Her behavior is normal. Judgment and thought content normal.    Results for orders placed or performed during the hospital encounter of 34/19/62  Basic metabolic panel  Result Value Ref Range   Sodium 137 135 - 145 mmol/L   Potassium 4.2 3.5 - 5.1 mmol/L   Chloride 106 101 - 111 mmol/L   CO2 23 22 - 32 mmol/L   Glucose, Bld 116 (H) 65 - 99 mg/dL   BUN 29 (H) 6 - 20 mg/dL   Creatinine, Ser 1.24 (H) 0.44 - 1.00 mg/dL   Calcium 9.6 8.9 - 10.3 mg/dL   GFR calc non Af  Amer 42 (L) >60 mL/min   GFR calc Af Amer 49 (L) >60 mL/min   Anion gap 8 5 - 15  CBC with Differential/Platelet  Result Value Ref Range   WBC 9.7 3.6 - 11.0 K/uL   RBC 4.24 3.80 - 5.20 MIL/uL   Hemoglobin 10.9 (L) 12.0 - 16.0 g/dL   HCT 33.5 (L) 35.0 - 47.0 %   MCV 79.0 (L) 80.0 - 100.0 fL   MCH 25.6 (L) 26.0 - 34.0 pg   MCHC 32.4 32.0 - 36.0 g/dL   RDW 15.1 (H) 11.5 - 14.5 %   Platelets 185 150 - 440 K/uL   Neutrophils Relative % 70 %   Neutro Abs 6.8 (H) 1.4 - 6.5 K/uL   Lymphocytes Relative 20 %   Lymphs Abs 1.9 1.0 - 3.6 K/uL   Monocytes Relative 7 %   Monocytes Absolute 0.7 0.2 - 0.9 K/uL   Eosinophils Relative 2 %   Eosinophils Absolute 0.2 0 - 0.7 K/uL   Basophils Relative 1 %    Basophils Absolute 0.1 0 - 0.1 K/uL  Protime-INR  Result Value Ref Range   Prothrombin Time 13.1 11.4 - 15.2 seconds   INR 1.00       Assessment & Plan:   Problem List Items Addressed This Visit      Unprioritized   Anemia    This is chronic but stable.  Will order anemia panel to characterize cause.        Relevant Orders   CBC with Differential/Platelet   Anemia panel   Chronic kidney disease, stage 3 (HCC) - Primary   Relevant Orders   Comprehensive metabolic panel   VITAMIN D 25 Hydroxy (Vit-D Deficiency, Fractures)   COPD, severe (HCC)    Change from Symbicort to [facility] due to cost.  Rx fro Albuterol      Relevant Medications   tiotropium (SPIRIVA HANDIHALER) 18 MCG inhalation capsule   albuterol (PROVENTIL HFA;VENTOLIN HFA) 108 (90 Base) MCG/ACT inhaler   Hyperlipidemia   Relevant Orders   Lipid Panel w/o Chol/HDL Ratio   Hypertension   Relevant Orders   Comprehensive metabolic panel       Follow up plan: Return in about 3 months (around 02/21/2018).

## 2017-11-25 LAB — COMPREHENSIVE METABOLIC PANEL
ALBUMIN: 4.5 g/dL (ref 3.5–4.8)
ALK PHOS: 89 IU/L (ref 39–117)
ALT: 9 IU/L (ref 0–32)
AST: 14 IU/L (ref 0–40)
Albumin/Globulin Ratio: 1.5 (ref 1.2–2.2)
BUN / CREAT RATIO: 19 (ref 12–28)
BUN: 26 mg/dL (ref 8–27)
Bilirubin Total: 0.4 mg/dL (ref 0.0–1.2)
CALCIUM: 9.7 mg/dL (ref 8.7–10.3)
CO2: 18 mmol/L — AB (ref 20–29)
CREATININE: 1.36 mg/dL — AB (ref 0.57–1.00)
Chloride: 105 mmol/L (ref 96–106)
GFR calc Af Amer: 45 mL/min/{1.73_m2} — ABNORMAL LOW (ref >59)
GFR, EST NON AFRICAN AMERICAN: 39 mL/min/{1.73_m2} — AB (ref >59)
GLUCOSE: 96 mg/dL (ref 65–99)
Globulin, Total: 3 g/dL (ref 1.5–4.5)
Potassium: 4.8 mmol/L (ref 3.5–5.2)
Sodium: 139 mmol/L (ref 134–144)
Total Protein: 7.5 g/dL (ref 6.0–8.5)

## 2017-11-25 LAB — VITAMIN D 25 HYDROXY (VIT D DEFICIENCY, FRACTURES): Vit D, 25-Hydroxy: 23.2 ng/mL — ABNORMAL LOW (ref 30.0–100.0)

## 2017-11-25 LAB — CBC WITH DIFFERENTIAL/PLATELET
BASOS ABS: 0.1 10*3/uL (ref 0.0–0.2)
Basos: 1 %
EOS (ABSOLUTE): 0.2 10*3/uL (ref 0.0–0.4)
EOS: 3 %
HEMOGLOBIN: 10.8 g/dL — AB (ref 11.1–15.9)
IMMATURE GRANS (ABS): 0 10*3/uL (ref 0.0–0.1)
IMMATURE GRANULOCYTES: 0 %
LYMPHS ABS: 1.6 10*3/uL (ref 0.7–3.1)
LYMPHS: 19 %
MCH: 24.9 pg — ABNORMAL LOW (ref 26.6–33.0)
MCHC: 31.2 g/dL — ABNORMAL LOW (ref 31.5–35.7)
MCV: 80 fL (ref 79–97)
Monocytes Absolute: 0.5 10*3/uL (ref 0.1–0.9)
Monocytes: 7 %
Neutrophils Absolute: 5.6 10*3/uL (ref 1.4–7.0)
Neutrophils: 70 %
Platelets: 193 10*3/uL (ref 150–379)
RBC: 4.34 x10E6/uL (ref 3.77–5.28)
RDW: 15.6 % — AB (ref 12.3–15.4)
WBC: 8 10*3/uL (ref 3.4–10.8)

## 2017-11-25 LAB — ANEMIA PANEL
FERRITIN: 31 ng/mL (ref 15–150)
FOLATE, HEMOLYSATE: 293.7 ng/mL
Folate, RBC: 849 ng/mL (ref >498)
HEMATOCRIT: 34.6 % (ref 34.0–46.6)
Iron Saturation: 9 % — CL (ref 15–55)
Iron: 31 ug/dL (ref 27–139)
Retic Ct Pct: 1.9 % (ref 0.6–2.6)
TIBC: 343 ug/dL (ref 250–450)
UIBC: 312 ug/dL (ref 118–369)
Vitamin B-12: 344 pg/mL (ref 232–1245)

## 2017-11-25 LAB — LIPID PANEL W/O CHOL/HDL RATIO
CHOLESTEROL TOTAL: 181 mg/dL (ref 100–199)
HDL: 51 mg/dL (ref >39)
LDL Calculated: 102 mg/dL — ABNORMAL HIGH (ref 0–99)
TRIGLYCERIDES: 142 mg/dL (ref 0–149)
VLDL CHOLESTEROL CAL: 28 mg/dL (ref 5–40)

## 2017-12-11 ENCOUNTER — Ambulatory Visit: Payer: Medicare Other | Admitting: Pulmonary Disease

## 2017-12-30 DIAGNOSIS — N183 Chronic kidney disease, stage 3 (moderate): Secondary | ICD-10-CM | POA: Diagnosis not present

## 2017-12-30 DIAGNOSIS — I10 Essential (primary) hypertension: Secondary | ICD-10-CM | POA: Diagnosis not present

## 2017-12-30 DIAGNOSIS — D631 Anemia in chronic kidney disease: Secondary | ICD-10-CM | POA: Diagnosis not present

## 2018-01-05 DIAGNOSIS — N183 Chronic kidney disease, stage 3 (moderate): Secondary | ICD-10-CM | POA: Diagnosis not present

## 2018-01-14 ENCOUNTER — Ambulatory Visit: Payer: Medicare Other | Admitting: Pulmonary Disease

## 2018-01-26 DIAGNOSIS — N2581 Secondary hyperparathyroidism of renal origin: Secondary | ICD-10-CM | POA: Diagnosis not present

## 2018-01-26 DIAGNOSIS — N183 Chronic kidney disease, stage 3 (moderate): Secondary | ICD-10-CM | POA: Diagnosis not present

## 2018-01-26 DIAGNOSIS — I10 Essential (primary) hypertension: Secondary | ICD-10-CM | POA: Diagnosis not present

## 2018-01-26 DIAGNOSIS — D631 Anemia in chronic kidney disease: Secondary | ICD-10-CM | POA: Diagnosis not present

## 2018-02-18 ENCOUNTER — Other Ambulatory Visit: Payer: Self-pay | Admitting: Unknown Physician Specialty

## 2018-02-18 DIAGNOSIS — I10 Essential (primary) hypertension: Secondary | ICD-10-CM

## 2018-02-23 ENCOUNTER — Encounter: Payer: Self-pay | Admitting: Unknown Physician Specialty

## 2018-02-23 ENCOUNTER — Ambulatory Visit (INDEPENDENT_AMBULATORY_CARE_PROVIDER_SITE_OTHER): Payer: Medicare Other | Admitting: Unknown Physician Specialty

## 2018-02-23 VITALS — BP 146/71 | HR 80 | Temp 98.9°F | Ht 63.0 in | Wt 155.6 lb

## 2018-02-23 DIAGNOSIS — I10 Essential (primary) hypertension: Secondary | ICD-10-CM | POA: Diagnosis not present

## 2018-02-23 DIAGNOSIS — D631 Anemia in chronic kidney disease: Secondary | ICD-10-CM | POA: Diagnosis not present

## 2018-02-23 DIAGNOSIS — E782 Mixed hyperlipidemia: Secondary | ICD-10-CM | POA: Diagnosis not present

## 2018-02-23 DIAGNOSIS — J449 Chronic obstructive pulmonary disease, unspecified: Secondary | ICD-10-CM

## 2018-02-23 DIAGNOSIS — N183 Chronic kidney disease, stage 3 unspecified: Secondary | ICD-10-CM

## 2018-02-23 DIAGNOSIS — Z1211 Encounter for screening for malignant neoplasm of colon: Secondary | ICD-10-CM

## 2018-02-23 NOTE — Assessment & Plan Note (Signed)
Last LDL was 102.

## 2018-02-23 NOTE — Assessment & Plan Note (Signed)
Seeing Dr. Leonidas Romberg.  Reminded to schedule appt as husband feels she needs O2.  Handicapped sticker application filled out due to SOB with walking.

## 2018-02-23 NOTE — Assessment & Plan Note (Signed)
Of chronic disease.  Following with Nephrology

## 2018-02-23 NOTE — Assessment & Plan Note (Signed)
A little high here but good numbers at home 

## 2018-02-23 NOTE — Assessment & Plan Note (Signed)
Following with [facility].  Reviewed note and work-up ongoing.  Due to follow-up soon

## 2018-02-23 NOTE — Progress Notes (Signed)
BP (!) 146/71 (BP Location: Left Arm, Cuff Size: Normal)   Pulse 80   Temp 98.9 F (37.2 C) (Oral)   Ht 5\' 3"  (1.6 m)   Wt 155 lb 9.6 oz (70.6 kg)   LMP  (LMP Unknown)   SpO2 96%   BMI 27.56 kg/m    Subjective:    Patient ID: Madison Franklin, female    DOB: [birth year], 72 y.o.   MRN: [id number]  HPI: Madison Franklin is a 72 y.o. female  Chief Complaint  Patient presents with  . Hyperlipidemia  . Hypertension   Pt is here with husband who gives part of the history.    Hypertension Using medications without difficulty Average home BPs 130/65 yesterday which is typical   No problems or lightheadedness No chest pain with exertion or shortness of breath No Edema   Hyperlipidemia Using medications without problems: No Muscle aches  Diet compliance: Eats a well balanced diet Exercise: Sedentary  COPD Seeing pulmonology for her severe COPD.  Stopped smoking last month. Taking symbicort.  Note renewed and lost to f/u as missed appointment in February.  Overnight oximetry seems to qualify for O2.    CKD/Anemia Following with nephrology.  Note reviewed and also following anemia of chronic disease    Relevant past medical, surgical, family and social history reviewed and updated as indicated. Interim medical history since our last visit reviewed. Allergies and medications reviewed and updated.  Review of Systems  Per HPI unless specifically indicated above     Objective:    BP (!) 146/71 (BP Location: Left Arm, Cuff Size: Normal)   Pulse 80   Temp 98.9 F (37.2 C) (Oral)   Ht 5\' 3"  (1.6 m)   Wt 155 lb 9.6 oz (70.6 kg)   LMP  (LMP Unknown)   SpO2 96%   BMI 27.56 kg/m   Wt Readings from Last 3 Encounters:  02/23/18 155 lb 9.6 oz (70.6 kg)  11/24/17 157 lb 4.8 oz (71.4 kg)  10/02/17 159 lb (72.1 kg)    Physical Exam  Constitutional: She is oriented to person, place, and time. She appears well-developed and well-nourished. No distress.  HENT:  Head: Normocephalic  and atraumatic.  Eyes: Conjunctivae and lids are normal. Right eye exhibits no discharge. Left eye exhibits no discharge. No scleral icterus.  Neck: Normal range of motion. Neck supple. No JVD present. Carotid bruit is not present.  Cardiovascular: Normal rate, regular rhythm and normal heart sounds.  Pulmonary/Chest: Effort normal and breath sounds normal.  Abdominal: Normal appearance. There is no splenomegaly or hepatomegaly.  Musculoskeletal: Normal range of motion.  Neurological: She is alert and oriented to person, place, and time.  Skin: Skin is warm, dry and intact. No rash noted. No pallor.  Psychiatric: She has a normal mood and affect. Her behavior is normal. Judgment and thought content normal.      Assessment & Plan:   Problem List Items Addressed This Visit      Unprioritized   Anemia    Of chronic disease.  Following with Nephrology      Chronic kidney disease, stage 3 [facility])    Following with [place].  Reviewed note and work-up ongoing.  Due to follow-up soon      COPD, severe ([place])    Seeing Dr. Leonidas Romberg.  Reminded to schedule appt as husband feels she needs O2.  Handicapped sticker application filled out due to SOB with walking.  Hyperlipidemia    Last LDL was 102.        Hypertension    A little high here but good numbers at home       Other Visit Diagnoses    Colon cancer screening    -  Primary   Relevant Orders   Ambulatory referral to Gastroenterology       Follow up plan: Return in about 6 months (around 08/26/2018), or if symptoms worsen or fail to improve.

## 2018-02-23 NOTE — Patient Instructions (Signed)
Please make appt with Dr. Leonidas Romberg

## 2018-03-08 ENCOUNTER — Telehealth: Payer: Self-pay | Admitting: *Deleted

## 2018-03-08 NOTE — Telephone Encounter (Signed)
Left message for patient to notify them that it is time to schedule annual low dose lung cancer screening CT scan. Instructed patient to call back to verify information prior to the scan being scheduled.  

## 2018-03-23 ENCOUNTER — Telehealth: Payer: Self-pay | Admitting: *Deleted

## 2018-03-23 NOTE — Telephone Encounter (Signed)
Left message for patient to notify them that it is time to schedule annual low dose lung cancer screening CT scan. Instructed patient to call back to verify information prior to the scan being scheduled.  

## 2018-03-26 ENCOUNTER — Telehealth: Payer: Self-pay | Admitting: *Deleted

## 2018-03-26 NOTE — Telephone Encounter (Signed)
Left pt message to contact Shawn P to schedule follow-up CT scan

## 2018-03-29 ENCOUNTER — Telehealth: Payer: Self-pay | Admitting: *Deleted

## 2018-03-29 ENCOUNTER — Encounter: Payer: Self-pay | Admitting: *Deleted

## 2018-03-29 NOTE — Telephone Encounter (Signed)
Left message for patient to notify them that it is time to schedule annual low dose lung cancer screening CT scan. Instructed patient to call back to verify information prior to the scan being scheduled.  

## 2018-04-19 DIAGNOSIS — D631 Anemia in chronic kidney disease: Secondary | ICD-10-CM | POA: Diagnosis not present

## 2018-04-19 DIAGNOSIS — N183 Chronic kidney disease, stage 3 (moderate): Secondary | ICD-10-CM | POA: Diagnosis not present

## 2018-04-19 DIAGNOSIS — I10 Essential (primary) hypertension: Secondary | ICD-10-CM | POA: Diagnosis not present

## 2018-05-05 ENCOUNTER — Encounter: Payer: Self-pay | Admitting: Pulmonary Disease

## 2018-05-05 ENCOUNTER — Ambulatory Visit (INDEPENDENT_AMBULATORY_CARE_PROVIDER_SITE_OTHER): Payer: Medicare Other | Admitting: Pulmonary Disease

## 2018-05-05 VITALS — BP 136/66 | HR 83 | Resp 16 | Ht 63.0 in | Wt 154.0 lb

## 2018-05-05 DIAGNOSIS — Z87891 Personal history of nicotine dependence: Secondary | ICD-10-CM

## 2018-05-05 DIAGNOSIS — R0609 Other forms of dyspnea: Secondary | ICD-10-CM | POA: Diagnosis not present

## 2018-05-05 DIAGNOSIS — R942 Abnormal results of pulmonary function studies: Secondary | ICD-10-CM

## 2018-05-05 DIAGNOSIS — M419 Scoliosis, unspecified: Secondary | ICD-10-CM

## 2018-05-05 DIAGNOSIS — I272 Pulmonary hypertension, unspecified: Secondary | ICD-10-CM

## 2018-05-05 MED ORDER — FLUTICASONE FUROATE-VILANTEROL 100-25 MCG/INH IN AEPB: 1.0000 | INHALATION_SPRAY | Freq: Every day | RESPIRATORY_TRACT | 0 refills | Status: DC

## 2018-05-05 NOTE — Addendum Note (Signed)
Addended by: Stephanie Coup on: 05/05/2018 03:52 PM   Modules accepted: Orders

## 2018-05-05 NOTE — Patient Instructions (Signed)
Trial of Breo inhaler.  Sample provided.  Use 1 inhalation daily.  Rinse mouth after use.  If you feel that the [facility] inhaler has improved your breathing, contact us and we will place a prescription for it.  Continue Spiriva inhaler  Continue albuterol as needed  Follow-up in 3 to 4 months

## 2018-05-05 NOTE — Progress Notes (Signed)
PULMONARY OFFICE FOLLOW-UP NOTE  Requesting MD/Service: [name], NP Date of initial consultation: 05/11/17 Reason for consultation: Former smoker, exertional dyspnea  PT PROFILE: 72 y.o. female former (light) smoker referred for evaluation of exertional dyspnea  PROBLEMS: Former smoker Severe thoracic kyphoscoliosis Moderate to severe restrictive pulmonary physiology Moderate pulmonary hypertension   DATA: CT chest 03/13/17: Severe thoracic kyphoscoliosis. 4 mm nodule in left upper lobe (decreased in size from previously). Minimal emphysema Echocardiogram 05/28/17: LVEF 60-65%.  Grade 1 diastolic dysfunction.  No significant valvular disease noted.  No evidence of right-sided overload. [place] 07/14/17: Mild to moderate pulmonary hypertension (mean PAP 34 mmHg, PVR 5.7 Wood units) PFTs 10/01/17: FVC: 1.28 L (49 %pred), FEV1: 0.83 > 0.91 L (41 > 45 %pred), FEV1/FVC: 65%; lung volume measurements invalid, DLCO 6.6 (31 %pred) ONO 10/26/17:  Minimal brief desaturation. Does not require nocturnal O2  INTERVAL: No major events  SUBJ:  This is a scheduled office follow up.  She has no new complaints.  She continues to have moderate exertional dyspnea with little day-to-day variation.  She remains on Spiriva HandiHaler.  She is unable to discern whether this is of benefit.  She also has an albuterol rescue inhaler which she believes provides her benefit.  She denies CP, fever, purulent sputum, hemoptysis, LE edema and calf tenderness.   Vitals:   05/05/18 1515 05/05/18 1518  BP:  136/66  Pulse:  83  Resp: 16   SpO2:  100%  Weight: 154 lb (69.9 kg)   Height: 5\' 3"  (1.6 m)   RA  EXAM:  Gen: No overt distress HEENT: Hearing aids present, NCAT, sclerae white Neck: No JVD noted Chest/Lungs: Severe thoracic scoliosis, breath sounds mildly diminished throughout, no wheezes or other adventitious sounds Cardiovascular: Regular, II/VI systolic murmur  Abdomen: Soft, NT, NABS Ext: No  C/C/E Neuro: No focal deficits noted Skin: Limited exam, no lesions noted  DATA:   BMP Latest Ref Rng & Units 11/24/2017 07/07/2017 05/26/2017  Glucose 65 - 99 mg/dL 96 116(H) 109(H)  BUN 8 - 27 mg/dL 26 29(H) 22  Creatinine 0.57 - 1.00 mg/dL 1.36(H) 1.24(H) 1.25(H)  BUN/Creat Ratio 12 - 28 19 - 18  Sodium 134 - 144 mmol/L 139 137 138  Potassium 3.5 - 5.2 mmol/L 4.8 4.2 4.7  Chloride 96 - 106 mmol/L 105 106 102  CO2 20 - 29 mmol/L 18(L) 23 21  Calcium 8.7 - 10.3 mg/dL 9.7 9.6 9.6    CBC Latest Ref Rng & Units 11/24/2017 07/07/2017 04/13/2017  WBC 3.4 - 10.8 x10E3/uL 8.0 9.7 9.3  Hemoglobin 11.1 - 15.9 g/dL 10.8(L) 10.9(L) 10.8(L)  Hematocrit 34.0 - 46.6 % 34.6 33.5(L) 36.3  Platelets 150 - 379 x10E3/uL 193 185 222    CXR: No new film   IMPRESSION:     ICD-10-CM   1. Former smoker Z87.891   2. Severe thoracic kyphoscoliosis M41.9   3. Moderate pulmonary hypertension I27.20   4. Restrictive pulmonary physiology due to severe kyphoscoliosis R94.2   5. Moderate exertional dyspnea, likely multifactorial R06.09    I continue to believe that her major respiratory problem is restrictive pulmonary disease due to severe kyphoscoliosis.  However, she does report symptomatic improvement with albuterol.  Therefore, she might benefit from ICS/LABA.  PLAN:  Trial of Brio inhaler.  Sample provided.  Instructed to use 1 inhalation daily and rinse mouth after use  If she feels that the [facility] inhaler has provided her benefit, she will contact us and we will place  a prescription for this.  She is to continue Spiriva inhaler as previously  Continue albuterol as needed  Follow-up in 3 to 4 months   Merton Border, MD PCCM service Mobile [phone] Pager [phone] 05/05/2018 3:35 PM

## 2018-05-11 DIAGNOSIS — D631 Anemia in chronic kidney disease: Secondary | ICD-10-CM | POA: Diagnosis not present

## 2018-05-11 DIAGNOSIS — I10 Essential (primary) hypertension: Secondary | ICD-10-CM | POA: Diagnosis not present

## 2018-05-11 DIAGNOSIS — N183 Chronic kidney disease, stage 3 (moderate): Secondary | ICD-10-CM | POA: Diagnosis not present

## 2018-05-11 DIAGNOSIS — N2581 Secondary hyperparathyroidism of renal origin: Secondary | ICD-10-CM | POA: Diagnosis not present

## 2018-05-18 ENCOUNTER — Other Ambulatory Visit: Payer: Self-pay | Admitting: Family Medicine

## 2018-05-18 ENCOUNTER — Other Ambulatory Visit: Payer: Self-pay | Admitting: Unknown Physician Specialty

## 2018-05-18 DIAGNOSIS — F17200 Nicotine dependence, unspecified, uncomplicated: Secondary | ICD-10-CM

## 2018-05-18 DIAGNOSIS — I10 Essential (primary) hypertension: Secondary | ICD-10-CM

## 2018-05-19 NOTE — Telephone Encounter (Signed)
Norvasc 5 MG tab Atorvastatin 10 MG tab refill Last Refill:11/20/17 #90  Last OV: 02/23/18 PCP: Julian Hy, NP Pharmacy:[facility] Refills completed

## 2018-05-26 ENCOUNTER — Other Ambulatory Visit: Payer: Self-pay | Admitting: Hematology and Oncology

## 2018-05-26 ENCOUNTER — Ambulatory Visit: Payer: Medicare Other

## 2018-05-26 ENCOUNTER — Inpatient Hospital Stay: Payer: Medicare Other | Attending: Hematology and Oncology | Admitting: Hematology and Oncology

## 2018-05-26 ENCOUNTER — Encounter: Payer: Self-pay | Admitting: Hematology and Oncology

## 2018-05-26 VITALS — BP 152/72 | HR 82 | Temp 98.2°F | Resp 18 | Ht 61.0 in | Wt 153.7 lb

## 2018-05-26 DIAGNOSIS — D631 Anemia in chronic kidney disease: Secondary | ICD-10-CM | POA: Diagnosis not present

## 2018-05-26 DIAGNOSIS — Z87891 Personal history of nicotine dependence: Secondary | ICD-10-CM | POA: Diagnosis not present

## 2018-05-26 DIAGNOSIS — J449 Chronic obstructive pulmonary disease, unspecified: Secondary | ICD-10-CM | POA: Diagnosis not present

## 2018-05-26 DIAGNOSIS — R911 Solitary pulmonary nodule: Secondary | ICD-10-CM | POA: Diagnosis not present

## 2018-05-26 DIAGNOSIS — N183 Chronic kidney disease, stage 3 unspecified: Secondary | ICD-10-CM

## 2018-05-26 DIAGNOSIS — Z79899 Other long term (current) drug therapy: Secondary | ICD-10-CM | POA: Insufficient documentation

## 2018-05-26 DIAGNOSIS — R0601 Orthopnea: Secondary | ICD-10-CM | POA: Insufficient documentation

## 2018-05-26 DIAGNOSIS — Z7982 Long term (current) use of aspirin: Secondary | ICD-10-CM | POA: Diagnosis not present

## 2018-05-26 DIAGNOSIS — R5383 Other fatigue: Secondary | ICD-10-CM

## 2018-05-26 DIAGNOSIS — Z86010 Personal history of colonic polyps: Secondary | ICD-10-CM | POA: Diagnosis not present

## 2018-05-26 DIAGNOSIS — E785 Hyperlipidemia, unspecified: Secondary | ICD-10-CM | POA: Diagnosis not present

## 2018-05-26 DIAGNOSIS — R0609 Other forms of dyspnea: Secondary | ICD-10-CM

## 2018-05-26 DIAGNOSIS — I129 Hypertensive chronic kidney disease with stage 1 through stage 4 chronic kidney disease, or unspecified chronic kidney disease: Secondary | ICD-10-CM | POA: Diagnosis not present

## 2018-05-26 DIAGNOSIS — N189 Chronic kidney disease, unspecified: Secondary | ICD-10-CM

## 2018-05-26 LAB — IRON AND TIBC
Iron: 33 ug/dL (ref 28–170)
SATURATION RATIOS: 8 % — AB (ref 10.4–31.8)
TIBC: 395 ug/dL (ref 250–450)
UIBC: 362 ug/dL

## 2018-05-26 LAB — BASIC METABOLIC PANEL
Anion gap: 12 (ref 5–15)
BUN: 33 mg/dL — AB (ref 8–23)
CO2: 20 mmol/L — ABNORMAL LOW (ref 22–32)
CREATININE: 1.51 mg/dL — AB (ref 0.44–1.00)
Calcium: 9.6 mg/dL (ref 8.9–10.3)
Chloride: 104 mmol/L (ref 98–111)
GFR calc Af Amer: 38 mL/min — ABNORMAL LOW (ref >60)
GFR, EST NON AFRICAN AMERICAN: 33 mL/min — AB (ref >60)
GLUCOSE: 107 mg/dL — AB (ref 70–99)
POTASSIUM: 4.9 mmol/L (ref 3.5–5.1)
SODIUM: 136 mmol/L (ref 135–145)

## 2018-05-26 LAB — CBC WITH DIFFERENTIAL/PLATELET
BASOS PCT: 1 %
Basophils Absolute: 0.1 10*3/uL (ref 0–0.1)
EOS ABS: 0.3 10*3/uL (ref 0–0.7)
Eosinophils Relative: 3 %
HEMATOCRIT: 33.5 % — AB (ref 35.0–47.0)
HEMOGLOBIN: 10.3 g/dL — AB (ref 12.0–16.0)
Lymphocytes Relative: 16 %
Lymphs Abs: 1.3 10*3/uL (ref 1.0–3.6)
MCH: 24.1 pg — ABNORMAL LOW (ref 26.0–34.0)
MCHC: 30.7 g/dL — AB (ref 32.0–36.0)
MCV: 78.4 fL — ABNORMAL LOW (ref 80.0–100.0)
MONOS PCT: 8 %
Monocytes Absolute: 0.7 10*3/uL (ref 0.2–0.9)
NEUTROS ABS: 6.3 10*3/uL (ref 1.4–6.5)
Neutrophils Relative %: 72 %
Platelets: 200 10*3/uL (ref 150–440)
RBC: 4.27 MIL/uL (ref 3.80–5.20)
RDW: 15.8 % — ABNORMAL HIGH (ref 11.5–14.5)
WBC: 8.7 10*3/uL (ref 3.6–11.0)

## 2018-05-26 LAB — VITAMIN B12: VITAMIN B 12: 259 pg/mL (ref 180–914)

## 2018-05-26 LAB — FERRITIN: FERRITIN: 20 ng/mL (ref 11–307)

## 2018-05-26 LAB — FOLATE: Folate: 10.3 ng/mL (ref >5.9)

## 2018-05-26 NOTE — Progress Notes (Signed)
Patient here today as new evaluation regarding anemia.  Referred by Dr. Holley Raring.  Patient is accompanied by her daughter today.

## 2018-05-26 NOTE — Progress Notes (Signed)
[facility] day:  05/26/2018  Chief Complaint: Madison Franklin is a 72 y.o. female with anemia of chronic kidney disease who is referred in consultation by Dr. Holley Raring for assessment and management.  HPI:  The patient has stage III chronic kidney disease.  Most recent GFR was 42 mL/min.  Notes indicate a gradual decline in renal function of the course of the last 8 months. She has hypertension.  Labs on 12/30/2017 revealed a negative SPEP and UPEP.  Labs on 04/19/2018 revealed hematocrit of 32.2, hemoglobin 9.9, MCV 79.1, platelets 208,000, WBC 9900 with an [place] of 7030.  Creatinine was 1.42.  Albumin was 4.1.  LFTs were normal.   Urinalysis revealed no hematuria.  She has a history of a 4 mm LUL lung nodule.  She has a history of colonic polyps.  Symptomatically, she notes fatigue and shortness of breath. Shortness of breath is exacerbated with exertion. Patient is sleeping most of the day.  She is only out of bed about 2 hours/day. She has 2 pillow orthopnea.    Patient denies bleeding; no hematochezia, melena, or gross hematuria. Patient had a colonoscopy on 11/14/2016 with Dr. [name]. Prep was sub-optimal, and full visualization was not possible per daughter. Findings included a 10 mm polyp in the cecum, seven 4-10 mm sessile polyps in the transverse colon, a 6 mm polyp in the descending colon, three 5-6 mm sessile polyps in the sigmoid colon,.  Pathology revealed 7 tubular adenomas, 2 hyperplastic polyps, and 2 inflammatory polyps.  Patient was scheduled to follow up in 1 year for a repeat, however she did not return.   Patient denies any B symptoms. She denies any interval infections.   Patient advises that she maintains an adequate appetite. She is eating well. Weight today is 153 lb 10.6 oz (69.7 kg). Patient denies pain in the clinic today.  Patient denies familyl history significant for any type of oncologic or hematologic disorder.     Past Medical History:  Diagnosis Date  . COPD (chronic obstructive pulmonary disease) ([place])   . Hearing aid worn    bilateral  . Hyperlipidemia   . Hypertension   . Osteoporosis   . Personal history of tobacco use, presenting hazards to health 08/15/2015    Past Surgical History:  Procedure Laterality Date  . ABDOMINAL HYSTERECTOMY    . COLONOSCOPY WITH PROPOFOL N/A 11/14/2016   Procedure: COLONOSCOPY WITH PROPOFOL;  Surgeon: [name], MD;  Location: [facility];  Service: Endoscopy;  Laterality: N/A;  . long nodule    . POLYPECTOMY  11/14/2016   Procedure: POLYPECTOMY;  Surgeon: [name], MD;  Location: [facility];  Service: Endoscopy;;  . RIGHT HEART CATH Right 07/14/2017   Procedure: RIGHT HEART CATH;  Surgeon: Nelva Bush, MD;  Location: [facility];  Service: Cardiovascular;  Laterality: Right;    Family History  Problem Relation Age of Onset  . Heart disease Neg Hx     Social History:  reports that she quit smoking about 16 months ago. Her smoking use included cigarettes. She has a 15.00 pack-year smoking history. She has never used smokeless tobacco. She reports that she does not drink alcohol or use drugs.  She smoked 1/2 pack/day x 30 years.  She quit in 2018.  She is a retired Secretary/administrator.  The patient is accompanied by her daughter, Madison Franklin,  today.  Allergies:  Allergies  Allergen Reactions  . Latex Itching and  Rash    With latex gloves    Current Medications: Current Outpatient Medications  Medication Sig Dispense Refill  . acetaminophen (TYLENOL) 325 MG tablet Take 325 mg by mouth every 6 (six) hours as needed (for pain.).    Marland Kitchen albuterol (PROVENTIL HFA;VENTOLIN HFA) 108 (90 Base) MCG/ACT inhaler Inhale 2 puffs into the lungs every 6 (six) hours as needed for wheezing or shortness of breath. 1 Inhaler 2  . amLODipine (NORVASC) 5 MG tablet Take 1 tablet (5 mg total) by mouth daily. 90 tablet 0  . aspirin EC 81 MG tablet Take  81 mg by mouth daily.    Marland Kitchen atorvastatin (LIPITOR) 10 MG tablet Take 1 tablet (10 mg total) by mouth daily. 90 tablet 0  . fluticasone furoate-vilanterol (BREO ELLIPTA) 100-25 MCG/INH AEPB Inhale 1 puff into the lungs daily. 28 each 0  . hydrochlorothiazide (MICROZIDE) 12.5 MG capsule Take 1 capsule (12.5 mg total) by mouth daily. 90 capsule 1  . lisinopril (PRINIVIL,ZESTRIL) 40 MG tablet Take 1 tablet (40 mg total) by mouth daily. 90 tablet 0  . tiotropium (SPIRIVA HANDIHALER) 18 MCG inhalation capsule Place 1 capsule (18 mcg total) into inhaler and inhale daily. 90 capsule 3  . Vitamin D, Cholecalciferol, 1000 units TABS Take 1 tablet by mouth daily.     No current facility-administered medications for this visit.     Review of Systems  Constitutional: Positive for malaise/fatigue. Negative for diaphoresis, fever and weight loss.       Feels alright. Sometimes feels tired and run down.  HENT: Negative.   Eyes: Negative.   Respiratory: Positive for shortness of breath (exertional). Negative for cough, hemoptysis and sputum production.   Cardiovascular: Positive for orthopnea (sleeps on 2 pillows). Negative for chest pain, palpitations, leg swelling and PND.  Gastrointestinal: Negative for abdominal pain, blood in stool, constipation, diarrhea, melena, nausea and vomiting.  Genitourinary: Negative for dysuria, frequency, hematuria and urgency.       CKD-III  Musculoskeletal: Negative for back pain, falls, joint pain and myalgias.  Skin: Negative for itching and rash.  Neurological: Negative for dizziness, tremors, weakness and headaches.  Endo/Heme/Allergies: Does not bruise/bleed easily.  Psychiatric/Behavioral: Negative for depression, memory loss and suicidal ideas. The patient is not nervous/anxious and does not have insomnia.        HYPERsomnia  All other systems reviewed and are negative.  Performance status (ECOG): 1 - Symptomatic but completely ambulatory  Vital Signs BP (!)  152/72 (BP Location: Left Arm, Patient Position: Sitting)   Pulse 82   Temp 98.2 F (36.8 C) (Tympanic)   Resp 18   Ht 5\' 1"  (1.549 m)   Wt 153 lb 10.6 oz (69.7 kg)   LMP  (LMP Unknown)   SpO2 94%   BMI 29.03 kg/m   Physical Exam  Constitutional: She is oriented to person, place, and time and well-developed, well-nourished, and in no distress.  HENT:  Head: Normocephalic and atraumatic.  Long graying hair.  Hearing aide.  Eyes: Pupils are equal, round, and reactive to light. EOM are normal. No scleral icterus.  Neck: Normal range of motion. Neck supple. No tracheal deviation present. No thyromegaly present.  Cardiovascular: Normal rate, regular rhythm and normal heart sounds. Exam reveals no gallop and no friction rub.  No murmur heard. Pulmonary/Chest: Effort normal and breath sounds normal. No respiratory distress. She has no wheezes. She has no rales.  Abdominal: Soft. Bowel sounds are normal. She exhibits no distension. There is no tenderness.  Fully round.  Musculoskeletal: Normal range of motion. She exhibits no edema or tenderness.  Lymphadenopathy:    She has no cervical adenopathy.    She has no axillary adenopathy.       Right: No inguinal and no supraclavicular adenopathy present.       Left: No inguinal and no supraclavicular adenopathy present.  Neurological: She is alert and oriented to person, place, and time.  Skin: Skin is warm and dry. No rash noted. No erythema.  Psychiatric: Mood, affect and judgment normal.  Nursing note and vitals reviewed.   No visits with results within 3 Day(s) from this visit.  Latest known visit with results is:  Office Visit on 11/24/2017  Component Date Value Ref Range Status  . WBC 11/24/2017 8.0  3.4 - 10.8 x10E3/uL Final  . RBC 11/24/2017 4.34  3.77 - 5.28 x10E6/uL Final  . Hemoglobin 11/24/2017 10.8* 11.1 - 15.9 g/dL Final  . MCV 11/24/2017 80  79 - 97 fL Final  . MCH 11/24/2017 24.9* 26.6 - 33.0 pg Final  . MCHC  11/24/2017 31.2* 31.5 - 35.7 g/dL Final  . RDW 11/24/2017 15.6* 12.3 - 15.4 % Final  . Platelets 11/24/2017 193  150 - 379 x10E3/uL Final  . Neutrophils 11/24/2017 70  Not Estab. % Final  . Lymphs 11/24/2017 19  Not Estab. % Final  . Monocytes 11/24/2017 7  Not Estab. % Final  . Eos 11/24/2017 3  Not Estab. % Final  . Basos 11/24/2017 1  Not Estab. % Final  . Neutrophils Absolute 11/24/2017 5.6  1.4 - 7.0 x10E3/uL Final  . Lymphocytes Absolute 11/24/2017 1.6  0.7 - 3.1 x10E3/uL Final  . Monocytes Absolute 11/24/2017 0.5  0.1 - 0.9 x10E3/uL Final  . EOS (ABSOLUTE) 11/24/2017 0.2  0.0 - 0.4 x10E3/uL Final  . Basophils Absolute 11/24/2017 0.1  0.0 - 0.2 x10E3/uL Final  . Immature Granulocytes 11/24/2017 0  Not Estab. % Final  . Immature Grans (Abs) 11/24/2017 0.0  0.0 - 0.1 x10E3/uL Final  . Glucose 11/24/2017 96  65 - 99 mg/dL Final  . BUN 11/24/2017 26  8 - 27 mg/dL Final  . Creatinine, Ser 11/24/2017 1.36* 0.57 - 1.00 mg/dL Final  . GFR calc non Af Amer 11/24/2017 39* >59 mL/min/1.73 Final  . GFR calc Af Amer 11/24/2017 45* >59 mL/min/1.73 Final  . BUN/Creatinine Ratio 11/24/2017 19  12 - 28 Final  . Sodium 11/24/2017 139  134 - 144 mmol/L Final  . Potassium 11/24/2017 4.8  3.5 - 5.2 mmol/L Final  . Chloride 11/24/2017 105  96 - 106 mmol/L Final  . CO2 11/24/2017 18* 20 - 29 mmol/L Final  . Calcium 11/24/2017 9.7  8.7 - 10.3 mg/dL Final  . Total Protein 11/24/2017 7.5  6.0 - 8.5 g/dL Final  . Albumin 11/24/2017 4.5  3.5 - 4.8 g/dL Final  . Globulin, Total 11/24/2017 3.0  1.5 - 4.5 g/dL Final  . Albumin/Globulin Ratio 11/24/2017 1.5  1.2 - 2.2 Final  . Bilirubin Total 11/24/2017 0.4  0.0 - 1.2 mg/dL Final  . Alkaline Phosphatase 11/24/2017 89  39 - 117 IU/L Final  . AST 11/24/2017 14  0 - 40 IU/L Final  . ALT 11/24/2017 9  0 - 32 IU/L Final  . Cholesterol, Total 11/24/2017 181  100 - 199 mg/dL Final  . Triglycerides 11/24/2017 142  0 - 149 mg/dL Final  . HDL 11/24/2017 51  >39  mg/dL Final  . VLDL Cholesterol Cal 11/24/2017 28  5 - 40 mg/dL Final  . LDL Calculated 11/24/2017 102* 0 - 99 mg/dL Final  . Total Iron Binding Capacity 11/24/2017 343  250 - 450 ug/dL Final  . UIBC 11/24/2017 312  118 - 369 ug/dL Final  . Iron 11/24/2017 31  27 - 139 ug/dL Final  . Iron Saturation 11/24/2017 9* 15 - 55 % Final  . Vitamin B-12 11/24/2017 344  232 - 1,245 pg/mL Final  . Folate, Hemolysate 11/24/2017 293.7  Not Estab. ng/mL Final  . Hematocrit 11/24/2017 34.6  34.0 - 46.6 % Final  . Folate, RBC 11/24/2017 849  >498 ng/mL Final  . Ferritin 11/24/2017 31  15 - 150 ng/mL Final  . Retic Ct Pct 11/24/2017 1.9  0.6 - 2.6 % Final  . Vit D, 25-Hydroxy 11/24/2017 23.2* 30.0 - 100.0 ng/mL Final   Comment: Vitamin D deficiency has been defined by the Institute of Medicine and an Endocrine Society practice guideline as a level of serum 25-OH vitamin D less than 20 ng/mL (1,2). The Endocrine Society went on to further define vitamin D insufficiency as a level between 21 and 29 ng/mL (2). 1. IOM (Institute of Medicine). 2010. Dietary reference    intakes for calcium and D. [place]: The    [employer]. 2. Holick MF, Binkley DeForest, Bischoff-Ferrari HA, et al.    Evaluation, treatment, and prevention of vitamin D    deficiency: an Endocrine Society clinical practice    guideline. JCEM. 2011 Jul; 96(7):1911-30.     Assessment:  NILI HONDA is a 72 y.o. female with anemia of chronic kidney disease.  She has had a gradual decline in renal function of the course of the last 8 months  She has a history of colonic polyps.   Colonoscopy on 11/14/2016 revealed a sub-optimal prep. Findings included a 10 mm polyp in the cecum, seven 4-10 mm sessile polyps in the transverse colon, a 6 mm polyp in the descending colon, three 5-6 mm sessile polyps in the sigmoid colon,.  Pathology revealed 7 tubular adenomas, 2 hyperplastic polyps, and 2 inflammatory polyps.  She was scheduled to  have a follow-up in 1 year.  She has a 15 pack year smoking history.  Chest CT on 03/13/2017 revealed a 4 mm LUL pulmonary nodule (decreased from prior).  Symptomatically, she is fatigued and has exertional dyspnea.  She has 2 pillow orthopnea.  Patient denies any areas of unexplained bruising.  She denies any obvious gastrointestinal bleeding.  Exam is unremarkable.  Plan: 1. Labs today: CBC with differential, CMP, ferritin, iron studies, B12, folate. 2. Anemia of chronic disease - ongoing  Hemoglobin 10.3, hematocrit 33.5, MCV 78.4.  Discussed need for Procrit injections to support declining counts.  Begin Procrit when hemoglobin < 10.  Preauthorize Procrit injections. 3. Lung screening - ongoing  Discussed low-dose chest CT lung screening program.  Patient is amenable to program participation.  Will have Dara Lords, RN follow-up with patient to schedule. 4. Discuss importance of follow-up with GI for repeat colonoscopy. 5. RTC in 1 week in 3 weeks for labs (hemoglobin), and Procrit injection. 6. RTC in 5 weeks for MD assessment, labs (CBC with diff, ferritin), and +/- Procrit injection.    Honor Loh, NP  05/26/2018, 12:32 PM   I saw and evaluated the patient, participating in the key portions of the service and reviewing pertinent diagnostic studies and records.  I reviewed the nurse practitioner's note and agree with the findings and the plan.  The  assessment and plan were discussed with the patient.  Multiple questions were asked by the patient and answered.   Nolon Stalls, MD 05/26/2018,12:32 PM

## 2018-05-27 ENCOUNTER — Telehealth: Payer: Self-pay | Admitting: *Deleted

## 2018-05-27 NOTE — Telephone Encounter (Signed)
Patient called, spoke with husband as she was in the restroom and he said that he made her appointments for her, r/t CT screening of lung due at this time.  Husband voiced understanding and agreement with CT screening.  Appointment sent to scheduling for future appointment.

## 2018-05-28 ENCOUNTER — Telehealth: Payer: Self-pay | Admitting: *Deleted

## 2018-05-28 DIAGNOSIS — Z122 Encounter for screening for malignant neoplasm of respiratory organs: Secondary | ICD-10-CM

## 2018-05-28 DIAGNOSIS — Z87891 Personal history of nicotine dependence: Secondary | ICD-10-CM

## 2018-05-28 NOTE — Telephone Encounter (Signed)
Patient has been notified that annual lung cancer screening low dose CT scan is due currently or will be in near future. Confirmed that patient is within the age range of 55-77, and asymptomatic, (no signs or symptoms of lung cancer). Patient denies illness that would prevent curative treatment for lung cancer if found. Verified smoking history, (former, quit <1 year ago, 30.75 pack year). The shared decision making visit was done 08/17/15. Patient is agreeable for CT scan being scheduled.

## 2018-06-01 ENCOUNTER — Ambulatory Visit
Admission: RE | Admit: 2018-06-01 | Discharge: 2018-06-01 | Disposition: A | Discharge status: Home / Self Care | Payer: Medicare Other | Source: Ambulatory Visit | Attending: Nurse Practitioner | Admitting: Nurse Practitioner

## 2018-06-01 DIAGNOSIS — I2510 Atherosclerotic heart disease of native coronary artery without angina pectoris: Secondary | ICD-10-CM | POA: Insufficient documentation

## 2018-06-01 DIAGNOSIS — R590 Localized enlarged lymph nodes: Secondary | ICD-10-CM | POA: Diagnosis not present

## 2018-06-01 DIAGNOSIS — J432 Centrilobular emphysema: Secondary | ICD-10-CM | POA: Insufficient documentation

## 2018-06-01 DIAGNOSIS — K828 Other specified diseases of gallbladder: Secondary | ICD-10-CM | POA: Insufficient documentation

## 2018-06-01 DIAGNOSIS — Z87891 Personal history of nicotine dependence: Secondary | ICD-10-CM | POA: Insufficient documentation

## 2018-06-01 DIAGNOSIS — I700 Atherosclerosis of aorta: Secondary | ICD-10-CM | POA: Insufficient documentation

## 2018-06-01 DIAGNOSIS — Z122 Encounter for screening for malignant neoplasm of respiratory organs: Secondary | ICD-10-CM | POA: Diagnosis not present

## 2018-06-02 ENCOUNTER — Encounter: Payer: Self-pay | Admitting: Hematology and Oncology

## 2018-06-02 ENCOUNTER — Inpatient Hospital Stay: Payer: Medicare Other

## 2018-06-02 VITALS — BP 154/63 | HR 85 | Temp 98.4°F | Resp 18

## 2018-06-02 DIAGNOSIS — Z86010 Personal history of colonic polyps: Secondary | ICD-10-CM | POA: Diagnosis not present

## 2018-06-02 DIAGNOSIS — N183 Chronic kidney disease, stage 3 unspecified: Secondary | ICD-10-CM

## 2018-06-02 DIAGNOSIS — R0601 Orthopnea: Secondary | ICD-10-CM | POA: Diagnosis not present

## 2018-06-02 DIAGNOSIS — D631 Anemia in chronic kidney disease: Secondary | ICD-10-CM

## 2018-06-02 DIAGNOSIS — Z79899 Other long term (current) drug therapy: Secondary | ICD-10-CM | POA: Diagnosis not present

## 2018-06-02 DIAGNOSIS — Z87891 Personal history of nicotine dependence: Secondary | ICD-10-CM | POA: Diagnosis not present

## 2018-06-02 DIAGNOSIS — R5383 Other fatigue: Secondary | ICD-10-CM | POA: Diagnosis not present

## 2018-06-02 DIAGNOSIS — I129 Hypertensive chronic kidney disease with stage 1 through stage 4 chronic kidney disease, or unspecified chronic kidney disease: Secondary | ICD-10-CM | POA: Diagnosis not present

## 2018-06-02 DIAGNOSIS — J449 Chronic obstructive pulmonary disease, unspecified: Secondary | ICD-10-CM | POA: Diagnosis not present

## 2018-06-02 DIAGNOSIS — R911 Solitary pulmonary nodule: Secondary | ICD-10-CM | POA: Diagnosis not present

## 2018-06-02 DIAGNOSIS — R0609 Other forms of dyspnea: Secondary | ICD-10-CM | POA: Diagnosis not present

## 2018-06-02 DIAGNOSIS — E785 Hyperlipidemia, unspecified: Secondary | ICD-10-CM | POA: Diagnosis not present

## 2018-06-02 DIAGNOSIS — Z7982 Long term (current) use of aspirin: Secondary | ICD-10-CM | POA: Diagnosis not present

## 2018-06-02 LAB — HEMOGLOBIN: Hemoglobin: 9.9 g/dL — ABNORMAL LOW (ref 12.0–16.0)

## 2018-06-02 MED ORDER — EPOETIN ALFA 20000 UNIT/ML IJ SOLN
10000.0000 [IU] | Freq: Once | INTRAMUSCULAR | Status: AC
  Administered 2018-06-02: 10000 [IU] via SUBCUTANEOUS

## 2018-06-02 NOTE — Patient Instructions (Signed)
Epoetin Alfa injection °What is this medicine? °EPOETIN ALFA (e POE e tin AL fa) helps your body make more red blood cells. This medicine is used to treat anemia caused by chronic kidney failure, cancer chemotherapy, or HIV-therapy. It may also be used before surgery if you have anemia. °This medicine may be used for other purposes; ask your health care provider or pharmacist if you have questions. °COMMON BRAND NAME(S): Epogen, Procrit °What should I tell my health care provider before I take this medicine? °They need to know if you have any of these conditions: °-blood clotting disorders °-cancer patient not on chemotherapy °-cystic fibrosis °-heart disease, such as angina or heart failure °-hemoglobin level of 12 g/dL or greater °-high blood pressure °-low levels of folate, iron, or vitamin B12 °-seizures °-an unusual or allergic reaction to erythropoietin, albumin, benzyl alcohol, hamster proteins, other medicines, foods, dyes, or preservatives °-pregnant or trying to get pregnant °-breast-feeding °How should I use this medicine? °This medicine is for injection into a vein or under the skin. It is usually given by a health care professional in a hospital or clinic setting. °If you get this medicine at home, you will be taught how to prepare and give this medicine. Use exactly as directed. Take your medicine at regular intervals. Do not take your medicine more often than directed. °It is important that you put your used needles and syringes in a special sharps container. Do not put them in a trash can. If you do not have a sharps container, call your pharmacist or healthcare provider to get one. °A special MedGuide will be given to you by the pharmacist with each prescription and refill. Be sure to read this information carefully each time. °Talk to your pediatrician regarding the use of this medicine in children. While this drug may be prescribed for selected conditions, precautions do apply. °Overdosage: If you  think you have taken too much of this medicine contact a poison control center or emergency room at once. °NOTE: This medicine is only for you. Do not share this medicine with others. °What if I miss a dose? °If you miss a dose, take it as soon as you can. If it is almost time for your next dose, take only that dose. Do not take double or extra doses. °What may interact with this medicine? °Do not take this medicine with any of the following medications: °-darbepoetin alfa °This list may not describe all possible interactions. Give your health care provider a list of all the medicines, herbs, non-prescription drugs, or dietary supplements you use. Also tell them if you smoke, drink alcohol, or use illegal drugs. Some items may interact with your medicine. °What should I watch for while using this medicine? °Your condition will be monitored carefully while you are receiving this medicine. °You may need blood work done while you are taking this medicine. °What side effects may I notice from receiving this medicine? °Side effects that you should report to your doctor or health care professional as soon as possible: °-allergic reactions like skin rash, itching or hives, swelling of the face, lips, or tongue °-breathing problems °-changes in vision °-chest pain °-confusion, trouble speaking or understanding °-feeling faint or lightheaded, falls °-high blood pressure °-muscle aches or pains °-pain, swelling, warmth in the leg °-rapid weight gain °-severe headaches °-sudden numbness or weakness of the face, arm or leg °-trouble walking, dizziness, loss of balance or coordination °-seizures (convulsions) °-swelling of the ankles, feet, hands °-unusually weak or tired °  Side effects that usually do not require medical attention (report to your doctor or health care professional if they continue or are bothersome): °-diarrhea °-fever, chills (flu-like symptoms) °-headaches °-nausea, vomiting °-redness, stinging, or swelling at  site where injected °This list may not describe all possible side effects. Call your doctor for medical advice about side effects. You may report side effects to FDA at 1-800-FDA-1088. °Where should I keep my medicine? °Keep out of the reach of children. °Store in a refrigerator between 2 and 8 degrees C (36 and 46 degrees F). Do not freeze or shake. Throw away any unused portion if using a single-dose vial. Multi-dose vials can be kept in the refrigerator for up to 21 days after the initial dose. Throw away unused medicine. °NOTE: This sheet is a summary. It may not cover all possible information. If you have questions about this medicine, talk to your doctor, pharmacist, or health care provider. °© 2018 Elsevier/Gold Standard (2016-05-26 19:42:31) ° °

## 2018-06-07 ENCOUNTER — Telehealth: Payer: Self-pay | Admitting: Unknown Physician Specialty

## 2018-06-07 ENCOUNTER — Telehealth: Payer: Self-pay | Admitting: *Deleted

## 2018-06-07 ENCOUNTER — Encounter: Payer: Self-pay | Admitting: *Deleted

## 2018-06-07 NOTE — Telephone Encounter (Signed)
Notified patient of LDCT lung cancer screening program results with recommendation for 12 month follow up imaging.  Also notified of incidental findings noted below and is encouraged to discuss further questions with PCP who will receive a copy of this not and/or the CT reports.  Patient verbalized understanding.   Discussed item 2, 3 with patient and husband and informed them that we would notify their primary MD and if they had questions or symptoms to contact them.  Voiced understanding.    IMPRESSION: 1. Lung-RADS 2, benign appearance or behavior. Continue annual screening with low-dose chest CT without contrast in 12 months. 2. Aortic atherosclerosis (ICD10-I70.0), coronary artery atherosclerosis and emphysema (ICD10-J43.9). 3. Pulmonary artery enlargement suggests pulmonary arterial hypertension. 4. Gallbladder wall calcifications indicative of "porcelain gallbladder". Per consensus criteria, presuming the patient is asymptomatic, this does not require dedicated imaging follow-up. If any right upper quadrant symptoms, recommend dedicated ultrasound. 5. Enlargement of a precarinal node which is upper normal sized, favored to be reactive.

## 2018-06-07 NOTE — Telephone Encounter (Signed)
Copied from [place] [id number]. Topic: Quick Communication - See Telephone Encounter >> Jun 07, 2018  2:20 PM Mylinda Latina, NT wrote: CRM for notification. See Telephone encounter for: 06/07/18.  Traci calling [facility] department is sendingover a fax / or via Epic on  the patient CT scan. She needs the provider to look at it as soon as possible. Something has popped up on there that is a concern.

## 2018-06-07 NOTE — Telephone Encounter (Signed)
Called Dawn at [facility] office, informed them of the results and that the patient and husband were made aware.    IMPRESSION: 1. Lung-RADS 2, benign appearance or behavior. Continue annual screening with low-dose chest CT without contrast in 12 months. 2. Aortic atherosclerosis (ICD10-I70.0), coronary artery atherosclerosis and emphysema (ICD10-J43.9). 3. Pulmonary artery enlargement suggests pulmonary arterial hypertension. 4. Gallbladder wall calcifications indicative of "porcelain gallbladder". Per consensus criteria, presuming the patient is asymptomatic, this does not require dedicated imaging follow-up. If any right upper quadrant symptoms, recommend dedicated ultrasound. 5. Enlargement of a precarinal node which is upper normal sized, favored to be reactive.

## 2018-06-11 NOTE — Telephone Encounter (Signed)
I'm not quite sure what exactly is the concern in question? It looks like she has a stable dilated pulmonary artery that was visible on last year's scan and that could indicate some pulmonary HTN. She has some CAD, she is on a statin. She also has some enlarged lymph nodes which the radiologist read as reactive, not cancerous. Please have her follow up in the office if she would like to discuss this further and any referrals that may come of this.

## 2018-06-11 NOTE — Telephone Encounter (Signed)
Called and spoke to the patient and her husband. I let them know what Fabio Bering said about CT. They state that they will call if they would like to schedule an appointment.

## 2018-06-16 ENCOUNTER — Inpatient Hospital Stay: Payer: Medicare Other

## 2018-06-16 VITALS — BP 131/72 | HR 86 | Temp 98.3°F | Resp 18

## 2018-06-16 DIAGNOSIS — Z87891 Personal history of nicotine dependence: Secondary | ICD-10-CM | POA: Diagnosis not present

## 2018-06-16 DIAGNOSIS — I129 Hypertensive chronic kidney disease with stage 1 through stage 4 chronic kidney disease, or unspecified chronic kidney disease: Secondary | ICD-10-CM | POA: Diagnosis not present

## 2018-06-16 DIAGNOSIS — R0601 Orthopnea: Secondary | ICD-10-CM | POA: Diagnosis not present

## 2018-06-16 DIAGNOSIS — N183 Chronic kidney disease, stage 3 unspecified: Secondary | ICD-10-CM

## 2018-06-16 DIAGNOSIS — R0609 Other forms of dyspnea: Secondary | ICD-10-CM | POA: Diagnosis not present

## 2018-06-16 DIAGNOSIS — Z86010 Personal history of colonic polyps: Secondary | ICD-10-CM | POA: Diagnosis not present

## 2018-06-16 DIAGNOSIS — D631 Anemia in chronic kidney disease: Secondary | ICD-10-CM

## 2018-06-16 DIAGNOSIS — Z79899 Other long term (current) drug therapy: Secondary | ICD-10-CM | POA: Diagnosis not present

## 2018-06-16 DIAGNOSIS — E785 Hyperlipidemia, unspecified: Secondary | ICD-10-CM | POA: Diagnosis not present

## 2018-06-16 DIAGNOSIS — J449 Chronic obstructive pulmonary disease, unspecified: Secondary | ICD-10-CM | POA: Diagnosis not present

## 2018-06-16 DIAGNOSIS — R911 Solitary pulmonary nodule: Secondary | ICD-10-CM | POA: Diagnosis not present

## 2018-06-16 DIAGNOSIS — Z7982 Long term (current) use of aspirin: Secondary | ICD-10-CM | POA: Diagnosis not present

## 2018-06-16 DIAGNOSIS — R5383 Other fatigue: Secondary | ICD-10-CM | POA: Diagnosis not present

## 2018-06-16 LAB — HEMOGLOBIN: Hemoglobin: 9.7 g/dL — ABNORMAL LOW (ref 12.0–16.0)

## 2018-06-16 MED ORDER — EPOETIN ALFA 10000 UNIT/ML IJ SOLN
10000.0000 [IU] | Freq: Once | INTRAMUSCULAR | Status: AC
  Administered 2018-06-16: 10000 [IU] via SUBCUTANEOUS

## 2018-06-16 NOTE — Patient Instructions (Signed)
Epoetin Alfa injection °What is this medicine? °EPOETIN ALFA (e POE e tin AL fa) helps your body make more red blood cells. This medicine is used to treat anemia caused by chronic kidney failure, cancer chemotherapy, or HIV-therapy. It may also be used before surgery if you have anemia. °This medicine may be used for other purposes; ask your health care provider or pharmacist if you have questions. °COMMON BRAND NAME(S): Epogen, Procrit °What should I tell my health care provider before I take this medicine? °They need to know if you have any of these conditions: °-blood clotting disorders °-cancer patient not on chemotherapy °-cystic fibrosis °-heart disease, such as angina or heart failure °-hemoglobin level of 12 g/dL or greater °-high blood pressure °-low levels of folate, iron, or vitamin B12 °-seizures °-an unusual or allergic reaction to erythropoietin, albumin, benzyl alcohol, hamster proteins, other medicines, foods, dyes, or preservatives °-pregnant or trying to get pregnant °-breast-feeding °How should I use this medicine? °This medicine is for injection into a vein or under the skin. It is usually given by a health care professional in a hospital or clinic setting. °If you get this medicine at home, you will be taught how to prepare and give this medicine. Use exactly as directed. Take your medicine at regular intervals. Do not take your medicine more often than directed. °It is important that you put your used needles and syringes in a special sharps container. Do not put them in a trash can. If you do not have a sharps container, call your pharmacist or healthcare provider to get one. °A special MedGuide will be given to you by the pharmacist with each prescription and refill. Be sure to read this information carefully each time. °Talk to your pediatrician regarding the use of this medicine in children. While this drug may be prescribed for selected conditions, precautions do apply. °Overdosage: If you  think you have taken too much of this medicine contact a poison control center or emergency room at once. °NOTE: This medicine is only for you. Do not share this medicine with others. °What if I miss a dose? °If you miss a dose, take it as soon as you can. If it is almost time for your next dose, take only that dose. Do not take double or extra doses. °What may interact with this medicine? °Do not take this medicine with any of the following medications: °-darbepoetin alfa °This list may not describe all possible interactions. Give your health care provider a list of all the medicines, herbs, non-prescription drugs, or dietary supplements you use. Also tell them if you smoke, drink alcohol, or use illegal drugs. Some items may interact with your medicine. °What should I watch for while using this medicine? °Your condition will be monitored carefully while you are receiving this medicine. °You may need blood work done while you are taking this medicine. °What side effects may I notice from receiving this medicine? °Side effects that you should report to your doctor or health care professional as soon as possible: °-allergic reactions like skin rash, itching or hives, swelling of the face, lips, or tongue °-breathing problems °-changes in vision °-chest pain °-confusion, trouble speaking or understanding °-feeling faint or lightheaded, falls °-high blood pressure °-muscle aches or pains °-pain, swelling, warmth in the leg °-rapid weight gain °-severe headaches °-sudden numbness or weakness of the face, arm or leg °-trouble walking, dizziness, loss of balance or coordination °-seizures (convulsions) °-swelling of the ankles, feet, hands °-unusually weak or tired °  Side effects that usually do not require medical attention (report to your doctor or health care professional if they continue or are bothersome): °-diarrhea °-fever, chills (flu-like symptoms) °-headaches °-nausea, vomiting °-redness, stinging, or swelling at  site where injected °This list may not describe all possible side effects. Call your doctor for medical advice about side effects. You may report side effects to FDA at 1-800-FDA-1088. °Where should I keep my medicine? °Keep out of the reach of children. °Store in a refrigerator between 2 and 8 degrees C (36 and 46 degrees F). Do not freeze or shake. Throw away any unused portion if using a single-dose vial. Multi-dose vials can be kept in the refrigerator for up to 21 days after the initial dose. Throw away unused medicine. °NOTE: This sheet is a summary. It may not cover all possible information. If you have questions about this medicine, talk to your doctor, pharmacist, or health care provider. °© 2018 Elsevier/Gold Standard (2016-05-26 19:42:31) ° °

## 2018-06-30 ENCOUNTER — Other Ambulatory Visit: Payer: Self-pay

## 2018-06-30 ENCOUNTER — Other Ambulatory Visit: Payer: Self-pay | Admitting: Hematology and Oncology

## 2018-06-30 ENCOUNTER — Inpatient Hospital Stay: Payer: Medicare Other | Attending: Hematology and Oncology | Admitting: Hematology and Oncology

## 2018-06-30 ENCOUNTER — Inpatient Hospital Stay: Payer: Medicare Other

## 2018-06-30 VITALS — BP 154/70 | HR 89 | Temp 98.6°F | Resp 18 | Wt 152.8 lb

## 2018-06-30 DIAGNOSIS — Z7982 Long term (current) use of aspirin: Secondary | ICD-10-CM | POA: Diagnosis not present

## 2018-06-30 DIAGNOSIS — E611 Iron deficiency: Secondary | ICD-10-CM | POA: Diagnosis not present

## 2018-06-30 DIAGNOSIS — Z86010 Personal history of colonic polyps: Secondary | ICD-10-CM | POA: Diagnosis not present

## 2018-06-30 DIAGNOSIS — J449 Chronic obstructive pulmonary disease, unspecified: Secondary | ICD-10-CM | POA: Diagnosis not present

## 2018-06-30 DIAGNOSIS — D631 Anemia in chronic kidney disease: Secondary | ICD-10-CM | POA: Diagnosis not present

## 2018-06-30 DIAGNOSIS — Z87891 Personal history of nicotine dependence: Secondary | ICD-10-CM | POA: Insufficient documentation

## 2018-06-30 DIAGNOSIS — E785 Hyperlipidemia, unspecified: Secondary | ICD-10-CM | POA: Diagnosis not present

## 2018-06-30 DIAGNOSIS — N183 Chronic kidney disease, stage 3 unspecified: Secondary | ICD-10-CM

## 2018-06-30 DIAGNOSIS — I700 Atherosclerosis of aorta: Secondary | ICD-10-CM | POA: Insufficient documentation

## 2018-06-30 DIAGNOSIS — E538 Deficiency of other specified B group vitamins: Secondary | ICD-10-CM | POA: Insufficient documentation

## 2018-06-30 DIAGNOSIS — Z79899 Other long term (current) drug therapy: Secondary | ICD-10-CM

## 2018-06-30 DIAGNOSIS — I129 Hypertensive chronic kidney disease with stage 1 through stage 4 chronic kidney disease, or unspecified chronic kidney disease: Secondary | ICD-10-CM | POA: Diagnosis not present

## 2018-06-30 DIAGNOSIS — I2510 Atherosclerotic heart disease of native coronary artery without angina pectoris: Secondary | ICD-10-CM | POA: Insufficient documentation

## 2018-06-30 DIAGNOSIS — M810 Age-related osteoporosis without current pathological fracture: Secondary | ICD-10-CM | POA: Diagnosis not present

## 2018-06-30 DIAGNOSIS — D509 Iron deficiency anemia, unspecified: Secondary | ICD-10-CM

## 2018-06-30 LAB — CBC WITH DIFFERENTIAL/PLATELET
Basophils Absolute: 0.1 10*3/uL (ref 0–0.1)
Basophils Relative: 1 %
Eosinophils Absolute: 0.2 10*3/uL (ref 0–0.7)
Eosinophils Relative: 3 %
HCT: 30.7 % — ABNORMAL LOW (ref 35.0–47.0)
Hemoglobin: 9.4 g/dL — ABNORMAL LOW (ref 12.0–16.0)
Lymphocytes Relative: 16 %
Lymphs Abs: 1.4 10*3/uL (ref 1.0–3.6)
MCH: 23.7 pg — ABNORMAL LOW (ref 26.0–34.0)
MCHC: 30.8 g/dL — ABNORMAL LOW (ref 32.0–36.0)
MCV: 77.1 fL — ABNORMAL LOW (ref 80.0–100.0)
Monocytes Absolute: 0.6 10*3/uL (ref 0.2–0.9)
Monocytes Relative: 7 %
Neutro Abs: 6.2 10*3/uL (ref 1.4–6.5)
Neutrophils Relative %: 73 %
Platelets: 203 10*3/uL (ref 150–440)
RBC: 3.98 MIL/uL (ref 3.80–5.20)
RDW: 16.4 % — ABNORMAL HIGH (ref 11.5–14.5)
WBC: 8.5 10*3/uL (ref 3.6–11.0)

## 2018-06-30 LAB — FERRITIN: Ferritin: 17 ng/mL (ref 11–307)

## 2018-06-30 NOTE — Progress Notes (Signed)
[facility] day:  06/30/2018  Chief Complaint: Madison Franklin is a 72 y.o. female with anemia of chronic kidney disease who is seen for review of work-up and discussion regarding direction of therapy.  HPI:  The patient was last seen in the medical oncology clinic on 05/26/2018 for initial consultation. She was felt to have anemia of chronic renal disease.  We discussed initiation of Procrit once her hemoglobin was < 10.  Labs on 05/26/2018 revealed a hematocrit of 33.5, hemoglobin 10.3, and MCV 78.4.  Ferritin was 20.  Iron saturation was 8% with a TIBC of 395.  B12 was 259 (low).  Folate was 10.3.  Creatinine was 1.51.  Low dose chest CT on 06/01/2018 revealed a Lung-RADS 2, benign appearance or behavior. Continued annual screening with low-dose chest CT without contrast was recommended in 12 months.  There was aortic atherosclerosis, coronary artery atherosclerosis and emphysema, pulmonary artery enlargement suggestive of pulmonary arterial hypertension, gallbladder wall calcifications indicative of "porcelain gallbladder". Per consensus criteria, presuming the patient is asymptomatic, this does not require dedicated imaging follow-up. If any right upper quadrant symptoms, recommend dedicated ultrasound.  There was enlargement of a precarinal node which was upper normal sized, favored to be reactive.   During the interim, patient has been doing "ok". Patient notes that her energy is "a whole lot better" following Procrit injections x 2. Patient continues to have exertional shortness of breath. Orthopnea has improved. She is no longer having to sleep propped up on pillows at night. She does advise that she continues to sleep a lot.   Patient denies that she has experienced any B symptoms. She denies any interval infections. Patient denies bleeding; no hematochezia, melena, or gross hematuria. Patient advises that she maintains an adequate appetite. She is  eating well. Weight today is 152 lb 12.5 oz (69.3 kg), which compared to her last visit to the clinic, represents a 1 pound loss.   Patient denies pain in the clinic today.   Past Medical History:  Diagnosis Date  . COPD (chronic obstructive pulmonary disease) ([place])   . Hearing aid worn    bilateral  . Hyperlipidemia   . Hypertension   . Osteoporosis   . Personal history of tobacco use, presenting hazards to health 08/15/2015    Past Surgical History:  Procedure Laterality Date  . ABDOMINAL HYSTERECTOMY    . COLONOSCOPY WITH PROPOFOL N/A 11/14/2016   Procedure: COLONOSCOPY WITH PROPOFOL;  Surgeon: [name], MD;  Location: [facility];  Service: Endoscopy;  Laterality: N/A;  . long nodule    . POLYPECTOMY  11/14/2016   Procedure: POLYPECTOMY;  Surgeon: [name], MD;  Location: [facility];  Service: Endoscopy;;  . RIGHT HEART CATH Right 07/14/2017   Procedure: RIGHT HEART CATH;  Surgeon: Nelva Bush, MD;  Location: [facility];  Service: Cardiovascular;  Laterality: Right;    Family History  Problem Relation Age of Onset  . Heart disease Neg Hx     Social History:  reports that she quit smoking about 17 months ago. Her smoking use included cigarettes. She has a 15.00 pack-year smoking history. She has never used smokeless tobacco. She reports that she does not drink alcohol or use drugs.  She smoked 1/2 pack/day x 30 years.  She quit in 2018.  She is a retired Secretary/administrator.  Her daughter's name is [name].  The patient is accompanied by a gentleman today.  Allergies:  Allergies  Allergen Reactions  . Latex Itching and Rash    With latex gloves    Current Medications: Current Outpatient Medications  Medication Sig Dispense Refill  . amLODipine (NORVASC) 5 MG tablet Take 1 tablet (5 mg total) by mouth daily. 90 tablet 0  . aspirin EC 81 MG tablet Take 81 mg by mouth daily.    Marland Kitchen atorvastatin (LIPITOR) 10 MG tablet Take 1 tablet (10 mg total) by  mouth daily. 90 tablet 0  . hydrochlorothiazide (MICROZIDE) 12.5 MG capsule Take 1 capsule (12.5 mg total) by mouth daily. 90 capsule 1  . lisinopril (PRINIVIL,ZESTRIL) 40 MG tablet Take 1 tablet (40 mg total) by mouth daily. 90 tablet 0  . Vitamin D, Cholecalciferol, 1000 units TABS Take 1 tablet by mouth daily.    Marland Kitchen acetaminophen (TYLENOL) 325 MG tablet Take 325 mg by mouth every 6 (six) hours as needed (for pain.).    Marland Kitchen albuterol (PROVENTIL HFA;VENTOLIN HFA) 108 (90 Base) MCG/ACT inhaler Inhale 2 puffs into the lungs every 6 (six) hours as needed for wheezing or shortness of breath. (Patient not taking: Reported on 06/30/2018) 1 Inhaler 2  . fluticasone furoate-vilanterol (BREO ELLIPTA) 100-25 MCG/INH AEPB Inhale 1 puff into the lungs daily. (Patient not taking: Reported on 06/30/2018) 28 each 0  . tiotropium (SPIRIVA HANDIHALER) 18 MCG inhalation capsule Place 1 capsule (18 mcg total) into inhaler and inhale daily. (Patient not taking: Reported on 06/30/2018) 90 capsule 3   No current facility-administered medications for this visit.     Review of Systems  Constitutional: Positive for weight loss (1 pound). Negative for chills, diaphoresis, fever and malaise/fatigue.       Energy level is "a whole lot better".  HENT: Negative.  Negative for congestion, ear discharge, ear pain, nosebleeds, sinus pain and sore throat.   Eyes: Negative.  Negative for double vision, photophobia, pain and discharge.  Respiratory: Positive for shortness of breath (with exercise). Negative for cough, hemoptysis and sputum production.   Cardiovascular: Negative for chest pain, palpitations, orthopnea, claudication, leg swelling and PND.  Gastrointestinal: Negative.  Negative for abdominal pain, blood in stool, constipation, diarrhea, melena, nausea and vomiting.  Genitourinary: Negative.  Negative for dysuria, frequency, hematuria and urgency.       CKD-III  Musculoskeletal: Negative.  Negative for back pain, joint  pain, myalgias and neck pain.  Skin: Negative.  Negative for itching and rash.  Neurological: Negative for dizziness, tingling, tremors, sensory change, focal weakness, weakness and headaches.  Endo/Heme/Allergies: Does not bruise/bleed easily.  Psychiatric/Behavioral: Negative for depression and memory loss. The patient does not have insomnia.        HYPERsomnia  All other systems reviewed and are negative.  Performance status (ECOG): 1  Vital Signs BP (!) 154/70 (BP Location: Left Arm, Patient Position: Sitting)   Pulse 89   Temp 98.6 F (37 C) (Tympanic)   Resp 18   Wt 152 lb 12.5 oz (69.3 kg)   LMP  (LMP Unknown)   BMI 28.87 kg/m   Physical Exam  Constitutional: She is oriented to person, place, and time and well-developed, well-nourished, and in no distress. No distress.  HENT:  Head: Normocephalic and atraumatic.  Long braids.  Hearing aide.  Eyes: Pupils are equal, round, and reactive to light. Conjunctivae and EOM are normal. No scleral icterus.  Neck: Normal range of motion. No JVD present.  Cardiovascular: Normal rate and regular rhythm. Exam reveals no gallop and no friction rub.  No murmur heard. Pulmonary/Chest: Effort  normal and breath sounds normal. No respiratory distress. She has no wheezes. She has no rales.  Abdominal: Soft. Bowel sounds are normal. She exhibits no distension and no mass. There is no tenderness. There is no rebound and no guarding.  Musculoskeletal: Normal range of motion. She exhibits no edema or tenderness.  Neurological: She is alert and oriented to person, place, and time.  Skin: Skin is warm and dry. No rash noted. She is not diaphoretic. No erythema.  Psychiatric: Mood, affect and judgment normal.  Nursing note and vitals reviewed.   Appointment on 06/30/2018  Component Date Value Ref Range Status  . WBC 06/30/2018 8.5  3.6 - 11.0 K/uL Final  . RBC 06/30/2018 3.98  3.80 - 5.20 MIL/uL Final  . Hemoglobin 06/30/2018 9.4* 12.0 - 16.0  g/dL Final  . HCT 06/30/2018 30.7* 35.0 - 47.0 % Final  . MCV 06/30/2018 77.1* 80.0 - 100.0 fL Final  . MCH 06/30/2018 23.7* 26.0 - 34.0 pg Final  . MCHC 06/30/2018 30.8* 32.0 - 36.0 g/dL Final  . RDW 06/30/2018 16.4* 11.5 - 14.5 % Final  . Platelets 06/30/2018 203  150 - 440 K/uL Final  . Neutrophils Relative % 06/30/2018 73  % Final  . Neutro Abs 06/30/2018 6.2  1.4 - 6.5 K/uL Final  . Lymphocytes Relative 06/30/2018 16  % Final  . Lymphs Abs 06/30/2018 1.4  1.0 - 3.6 K/uL Final  . Monocytes Relative 06/30/2018 7  % Final  . Monocytes Absolute 06/30/2018 0.6  0.2 - 0.9 K/uL Final  . Eosinophils Relative 06/30/2018 3  % Final  . Eosinophils Absolute 06/30/2018 0.2  0 - 0.7 K/uL Final  . Basophils Relative 06/30/2018 1  % Final  . Basophils Absolute 06/30/2018 0.1  0 - 0.1 K/uL Final   Performed at [facility], [address], [place], [place] [address]    Assessment:  Madison Franklin is a 72 y.o. female with anemia of chronic kidney disease.  She has had a gradual decline in renal function of the course of the last 8 months  Labs on 05/26/2018 revealed a hematocrit of 33.5, hemoglobin 10.3, and MCV 78.4.  Ferritin was 20.  Iron saturation was 8% with a TIBC of 395.  B12 was 259 (low).  Folate was 10.3.  Creatinine was 1.51.   She has iron deficiency and B12 deficiency.  She has a history of colonic polyps.   Colonoscopy on 11/14/2016 revealed a sub-optimal prep. Findings included a 10 mm polyp in the cecum, seven 4-10 mm sessile polyps in the transverse colon, a 6 mm polyp in the descending colon, three 5-6 mm sessile polyps in the sigmoid colon,.  Pathology revealed 7 tubular adenomas, 2 hyperplastic polyps, and 2 inflammatory polyps.  She was scheduled to have a follow-up in 1 year.  She has a 15 pack year smoking history.  Chest CT on 03/13/2017 revealed a 4 mm LUL pulmonary nodule (decreased from prior).   Low dose chest CT on 06/01/2018 revealed a Lung-RADS 2, benign  appearance or behavior.  There was aortic atherosclerosis, coronary artery atherosclerosis and emphysema, pulmonary artery enlargement suggestive of pulmonary arterial hypertension, gallbladder wall calcifications indicative of "porcelain gallbladder".  There was enlargement of a precarinal node which was upper normal sized, favored to be reactive.  Symptomatically, she feels better.  She has shortness of breath on exertion.  She denies bleeding; no hematochezia, melena, or gross hematuria.  Exam is unremarkable.  Plan: 1. Labs today: CBC  with diff, ferritin, B12, anti-parietal antibody, intrinsic factor antibody. 2. Anemia of chronic renal disease  Hemoglobin 9.4, hematocrit 30.7, and MCV 77.1.  Discuss correcting underlying iron and B12 deficiencies prior to additional Procrit injections.  3. Iron deficiency  Discuss iron stores. Ferritin low at 20. Patient encouraged to take oral iron supplement with a source of vitamin C daily. If patient unable to tolerate oral iron, or ferritin does not improve, discussed the need for intravenous iron replacement.  Preauthorize Venofer.   4. B12 deficiency  Patient takes TMV that contains B12. Patient to call and advise on B12 content.   Discuss potential parenteral B12 supplementation.  5. RTC in 1 month for MD assessment, labs (CBC with diff, ferritin, B12 - day before), and +/- Venofer.   Honor Loh, NP  06/30/2018, 3:30 PM   I saw and evaluated the patient, participating in the key portions of the service and reviewing pertinent diagnostic studies and records.  I reviewed the nurse practitioner's note and agree with the findings and the plan.  The assessment and plan were discussed with the patient.  Multiple questions were asked by the patient and answered.   Nolon Stalls, MD 06/30/2018,3:30 PM

## 2018-06-30 NOTE — Progress Notes (Signed)
Here for folow up. Stated she is "doing good " ( not taking all inhalers due to financial issues she stated )

## 2018-07-01 ENCOUNTER — Encounter: Payer: Self-pay | Admitting: Nephrology

## 2018-07-01 LAB — ANTI-PARIETAL ANTIBODY: Parietal Cell Antibody-IgG: 0.7 Units (ref 0.0–20.0)

## 2018-07-01 LAB — VITAMIN B12: Vitamin B-12: 200 pg/mL (ref 180–914)

## 2018-07-01 LAB — INTRINSIC FACTOR ANTIBODIES: Intrinsic Factor: 1 AU/mL (ref 0.0–1.1)

## 2018-07-02 ENCOUNTER — Telehealth: Payer: Self-pay | Admitting: *Deleted

## 2018-07-02 NOTE — Telephone Encounter (Signed)
Called patient and LVM that B-12 is low. MD recommends oral B-12 1000 mcg daily. This should be in addition to regular multivitamin.

## 2018-07-02 NOTE — Telephone Encounter (Signed)
-----   Message from Karen Kitchens, NP sent at 07/01/2018  5:55 PM EDT ----- B12 low. She will need to start on oral B12 1,000 mcg daily. This can be in addition to her daily TMV.   Gaspar Bidding

## 2018-07-16 DIAGNOSIS — H9012 Conductive hearing loss, unilateral, left ear, with unrestricted hearing on the contralateral side: Secondary | ICD-10-CM | POA: Diagnosis not present

## 2018-07-16 DIAGNOSIS — H6121 Impacted cerumen, right ear: Secondary | ICD-10-CM | POA: Diagnosis not present

## 2018-07-25 ENCOUNTER — Other Ambulatory Visit: Payer: Self-pay

## 2018-07-25 ENCOUNTER — Emergency Department: Payer: Medicare Other

## 2018-07-25 ENCOUNTER — Encounter: Payer: Self-pay | Admitting: Emergency Medicine

## 2018-07-25 ENCOUNTER — Emergency Department
Admission: EM | Admit: 2018-07-25 | Discharge: 2018-07-25 | Disposition: A | Discharge status: Home / Self Care | Payer: Medicare Other | Attending: Emergency Medicine | Admitting: Emergency Medicine

## 2018-07-25 DIAGNOSIS — I129 Hypertensive chronic kidney disease with stage 1 through stage 4 chronic kidney disease, or unspecified chronic kidney disease: Secondary | ICD-10-CM | POA: Diagnosis not present

## 2018-07-25 DIAGNOSIS — R1907 Generalized intra-abdominal and pelvic swelling, mass and lump: Secondary | ICD-10-CM | POA: Diagnosis not present

## 2018-07-25 DIAGNOSIS — Z87891 Personal history of nicotine dependence: Secondary | ICD-10-CM | POA: Diagnosis not present

## 2018-07-25 DIAGNOSIS — Z91040 Latex allergy status: Secondary | ICD-10-CM | POA: Diagnosis not present

## 2018-07-25 DIAGNOSIS — R1032 Left lower quadrant pain: Secondary | ICD-10-CM | POA: Insufficient documentation

## 2018-07-25 DIAGNOSIS — J449 Chronic obstructive pulmonary disease, unspecified: Secondary | ICD-10-CM | POA: Diagnosis not present

## 2018-07-25 DIAGNOSIS — R1030 Lower abdominal pain, unspecified: Secondary | ICD-10-CM | POA: Diagnosis present

## 2018-07-25 DIAGNOSIS — R109 Unspecified abdominal pain: Secondary | ICD-10-CM

## 2018-07-25 DIAGNOSIS — Z79899 Other long term (current) drug therapy: Secondary | ICD-10-CM | POA: Diagnosis not present

## 2018-07-25 DIAGNOSIS — K529 Noninfective gastroenteritis and colitis, unspecified: Secondary | ICD-10-CM | POA: Diagnosis not present

## 2018-07-25 DIAGNOSIS — Z7982 Long term (current) use of aspirin: Secondary | ICD-10-CM | POA: Diagnosis not present

## 2018-07-25 DIAGNOSIS — R112 Nausea with vomiting, unspecified: Secondary | ICD-10-CM | POA: Insufficient documentation

## 2018-07-25 DIAGNOSIS — N183 Chronic kidney disease, stage 3 (moderate): Secondary | ICD-10-CM | POA: Diagnosis not present

## 2018-07-25 LAB — COMPREHENSIVE METABOLIC PANEL
ALBUMIN: 4.3 g/dL (ref 3.5–5.0)
ALT: 8 U/L (ref 0–44)
AST: 17 U/L (ref 15–41)
Alkaline Phosphatase: 69 U/L (ref 38–126)
Anion gap: 12 (ref 5–15)
BUN: 37 mg/dL — AB (ref 8–23)
CHLORIDE: 109 mmol/L (ref 98–111)
CO2: 17 mmol/L — AB (ref 22–32)
Calcium: 9.2 mg/dL (ref 8.9–10.3)
Creatinine, Ser: 1.33 mg/dL — ABNORMAL HIGH (ref 0.44–1.00)
GFR calc Af Amer: 45 mL/min — ABNORMAL LOW (ref >60)
GFR, EST NON AFRICAN AMERICAN: 39 mL/min — AB (ref >60)
GLUCOSE: 107 mg/dL — AB (ref 70–99)
POTASSIUM: 4.6 mmol/L (ref 3.5–5.1)
SODIUM: 138 mmol/L (ref 135–145)
Total Bilirubin: 0.6 mg/dL (ref 0.3–1.2)
Total Protein: 7.6 g/dL (ref 6.5–8.1)

## 2018-07-25 LAB — URINALYSIS, COMPLETE (UACMP) WITH MICROSCOPIC
BILIRUBIN URINE: NEGATIVE
Bacteria, UA: NONE SEEN
GLUCOSE, UA: NEGATIVE mg/dL
Hgb urine dipstick: NEGATIVE
KETONES UR: NEGATIVE mg/dL
LEUKOCYTES UA: NEGATIVE
Nitrite: NEGATIVE
Protein, ur: NEGATIVE mg/dL
SPECIFIC GRAVITY, URINE: 1.043 — AB (ref 1.005–1.030)
pH: 5 (ref 5.0–8.0)

## 2018-07-25 LAB — CBC WITH DIFFERENTIAL/PLATELET
BASOS ABS: 0.1 10*3/uL (ref 0–0.1)
BASOS PCT: 1 %
EOS ABS: 0.1 10*3/uL (ref 0–0.7)
Eosinophils Relative: 1 %
HCT: 31.4 % — ABNORMAL LOW (ref 35.0–47.0)
Hemoglobin: 9.6 g/dL — ABNORMAL LOW (ref 12.0–16.0)
Lymphocytes Relative: 7 %
Lymphs Abs: 0.8 10*3/uL — ABNORMAL LOW (ref 1.0–3.6)
MCH: 23.9 pg — ABNORMAL LOW (ref 26.0–34.0)
MCHC: 30.4 g/dL — ABNORMAL LOW (ref 32.0–36.0)
MCV: 78.6 fL — ABNORMAL LOW (ref 80.0–100.0)
Monocytes Absolute: 0.7 10*3/uL (ref 0.2–0.9)
Monocytes Relative: 7 %
Neutro Abs: 9.2 10*3/uL — ABNORMAL HIGH (ref 1.4–6.5)
Neutrophils Relative %: 84 %
Platelets: 190 10*3/uL (ref 150–440)
RBC: 4 MIL/uL (ref 3.80–5.20)
RDW: 18 % — ABNORMAL HIGH (ref 11.5–14.5)
WBC: 10.9 10*3/uL (ref 3.6–11.0)

## 2018-07-25 LAB — LIPASE, BLOOD: Lipase: 33 U/L (ref 11–51)

## 2018-07-25 MED ORDER — DICYCLOMINE HCL 20 MG PO TABS
ORAL_TABLET | ORAL | Status: AC
  Administered 2018-07-25: 20 mg
  Filled 2018-07-25: qty 1

## 2018-07-25 MED ORDER — DICYCLOMINE HCL 10 MG PO CAPS: 20.0000 mg | ORAL_CAPSULE | Freq: Once | ORAL | Status: DC

## 2018-07-25 MED ORDER — ONDANSETRON HCL 4 MG PO TABS: 4.0000 mg | ORAL_TABLET | Freq: Three times a day (TID) | ORAL | 0 refills | Status: DC | PRN

## 2018-07-25 MED ORDER — AMOXICILLIN-POT CLAVULANATE ER 1000-62.5 MG PO TB12: 1.0000 | ORAL_TABLET | Freq: Two times a day (BID) | ORAL | 0 refills | Status: AC

## 2018-07-25 MED ORDER — DICYCLOMINE HCL 20 MG PO TABS: 20.0000 mg | ORAL_TABLET | Freq: Three times a day (TID) | ORAL | 0 refills | Status: DC | PRN

## 2018-07-25 MED ORDER — DICYCLOMINE HCL 10 MG PO CAPS
10.0000 mg | ORAL_CAPSULE | Freq: Once | ORAL | Status: DC
  Filled 2018-07-25: qty 1

## 2018-07-25 MED ORDER — IOHEXOL 300 MG/ML  SOLN
75.0000 mL | Freq: Once | INTRAMUSCULAR | Status: AC | PRN
  Administered 2018-07-25: 75 mL via INTRAVENOUS

## 2018-07-25 NOTE — ED Triage Notes (Signed)
Pt to ED via POV c/o lower abd pain that started this morning around 0700. Pt denies diarrhea but states that she has been vomiting. Pt states that she vomited 2 times. Pt is currently in NAD

## 2018-07-25 NOTE — Discharge Instructions (Addendum)
Please seek medical attention for any high fevers, chest pain, shortness of breath, change in behavior, persistent vomiting, bloody stool or any other new or concerning symptoms.  

## 2018-07-25 NOTE — ED Provider Notes (Signed)
[facility] Emergency Department Provider Note  ____________________________________________   I have reviewed the triage vital signs and the nursing notes.   HISTORY  Chief Complaint Abdominal Pain   History limited by: Not Limited   HPI Madison Franklin is a 72 y.o. female who presents to the emergency department today because of concerns for abdominal pain.  The patient states that the pain started this morning.  It did not wake her up from sleep.  Is located in the suprapubic and left lower quadrant.  She describes it as sharp.  It has been accompanied by both nausea vomiting and diarrhea.  She has not noticed any blood in her vomiting or diarrhea.  She denies any urinary changes this morning.  She denies any fevers.  Denies having similar pain in the past.  Has had a hysterectomy but denies any other abdominal surgery.   Per medical record review patient has a history of HLD, HTN  Past Medical History:  Diagnosis Date  . COPD (chronic obstructive pulmonary disease) ([place])   . Hearing aid worn    bilateral  . Hyperlipidemia   . Hypertension   . Osteoporosis   . Personal history of tobacco use, presenting hazards to health 08/15/2015    Patient Active Problem List   Diagnosis Date Noted  . Nodule of upper lobe of left lung 05/26/2018  . Anemia in chronic kidney disease 05/26/2018  . Anemia 11/24/2017  . Coronary artery calcification 06/18/2017  . Encounter for screening mammogram for breast cancer 04/13/2017  . Heart murmur 04/13/2017  . Chronic kidney disease, stage 3 ([place]) 04/13/2017  . Advanced care planning/counseling discussion 01/05/2017  . Hx of colonic polyps   . Benign neoplasm of cecum   . Benign neoplasm of descending colon   . Benign neoplasm of transverse colon   . Polyp of sigmoid colon   . Osteoporosis 07/26/2015  . COPD, severe ([place]) 07/26/2015  . Hypertension 07/26/2015  . Hyperlipidemia 07/26/2015    Past Surgical History:   Procedure Laterality Date  . ABDOMINAL HYSTERECTOMY    . COLONOSCOPY WITH PROPOFOL N/A 11/14/2016   Procedure: COLONOSCOPY WITH PROPOFOL;  Surgeon: [name], MD;  Location: [facility];  Service: Endoscopy;  Laterality: N/A;  . long nodule    . POLYPECTOMY  11/14/2016   Procedure: POLYPECTOMY;  Surgeon: [name], MD;  Location: [facility];  Service: Endoscopy;;  . RIGHT HEART CATH Right 07/14/2017   Procedure: RIGHT HEART CATH;  Surgeon: Nelva Bush, MD;  Location: [facility];  Service: Cardiovascular;  Laterality: Right;    Prior to Admission medications   Medication Sig Start Date End Date Taking? Authorizing Provider  acetaminophen (TYLENOL) 325 MG tablet Take 325 mg by mouth every 6 (six) hours as needed (for pain.).    [provider]  albuterol (PROVENTIL HFA;VENTOLIN HFA) 108 (90 Base) MCG/ACT inhaler Inhale 2 puffs into the lungs every 6 (six) hours as needed for wheezing or shortness of breath. Patient not taking: Reported on 06/30/2018 11/24/17   [name], NP  amLODipine (NORVASC) 5 MG tablet Take 1 tablet (5 mg total) by mouth daily. 05/19/18   [name], NP  aspirin EC 81 MG tablet Take 81 mg by mouth daily.    [provider]  atorvastatin (LIPITOR) 10 MG tablet Take 1 tablet (10 mg total) by mouth daily. 05/19/18   [name], NP  fluticasone furoate-vilanterol (BREO ELLIPTA) 100-25 MCG/INH AEPB Inhale 1 puff into the  lungs daily. Patient not taking: Reported on 06/30/2018 05/05/18   Wilhelmina Mcardle, MD  hydrochlorothiazide (MICROZIDE) 12.5 MG capsule Take 1 capsule (12.5 mg total) by mouth daily. 11/20/17   [name], NP  lisinopril (PRINIVIL,ZESTRIL) 40 MG tablet Take 1 tablet (40 mg total) by mouth daily. 05/18/18   Johnson, Megan P, DO  tiotropium (SPIRIVA HANDIHALER) 18 MCG inhalation capsule Place 1 capsule (18 mcg total) into inhaler and inhale daily. Patient not taking: Reported on 06/30/2018 11/24/17    [name], NP  Vitamin D, Cholecalciferol, 1000 units TABS Take 1 tablet by mouth daily.    [provider]    Allergies Latex  Family History  Problem Relation Age of Onset  . Heart disease Neg Hx     Social History Social History   Tobacco Use  . Smoking status: Former Smoker    Packs/day: 0.50    Years: 30.00    Pack years: 15.00    Types: Cigarettes    Last attempt to quit: 01/17/2017    Years since quitting: 1.5  . Smokeless tobacco: Never Used  Substance Use Topics  . Alcohol use: No    Alcohol/week: 0.0 standard drinks  . Drug use: No    Review of Systems Constitutional: No fever/chills Eyes: No visual changes. ENT: No sore throat. Cardiovascular: Denies chest pain. Respiratory: Denies shortness of breath. Gastrointestinal: Positive for lower abdominal pain, nausea vomiting Genitourinary: Negative for dysuria. Musculoskeletal: Negative for back pain. Skin: Negative for rash. Neurological: Negative for headaches, focal weakness or numbness.  ____________________________________________   PHYSICAL EXAM:  VITAL SIGNS: ED Triage Vitals [07/25/18 1031]  Enc Vitals Group     BP (!) 138/59     Pulse Rate 80     Resp 16     Temp 97.8 F (36.6 C)     Temp Source Oral     SpO2 97 %     Weight 150 lb (68 kg)     Height 5\' 1"  (1.549 m)     Head Circumference      Peak Flow      Pain Score 10    Constitutional: Alert and oriented.  Eyes: Conjunctivae are normal.  ENT      Head: Normocephalic and atraumatic.      Nose: No congestion/rhinnorhea.      Mouth/Throat: Mucous membranes are moist.      Neck: No stridor. Hematological/Lymphatic/Immunilogical: No cervical lymphadenopathy. Cardiovascular: Normal rate, regular rhythm.  No murmurs, rubs, or gallops.  Respiratory: Normal respiratory effort without tachypnea nor retractions. Breath sounds are clear and equal bilaterally. No wheezes/rales/rhonchi. Gastrointestinal: Soft and minimally  tender in the left lower quadrant. No rebound. No guarding.  Genitourinary: Deferred Musculoskeletal: Normal range of motion in all extremities. No lower extremity edema. Neurologic:  Normal speech and language. No gross focal neurologic deficits are appreciated.  Skin:  Skin is warm, dry and intact. No rash noted. Psychiatric: Mood and affect are normal. Speech and behavior are normal. Patient exhibits appropriate insight and judgment.  ____________________________________________    LABS (pertinent positives/negatives)  CBC wbc 10.9, hgb 9.6, plt 190 CMP na 138, k 4.6, glu 107, cr 1.33 Lipase 33 ____________________________________________   EKG  None  ____________________________________________    RADIOLOGY  CT ab/pe Colitis. Porcelain gallbladder  ____________________________________________   PROCEDURES  Procedures  ____________________________________________   INITIAL IMPRESSION / ASSESSMENT AND PLAN / ED COURSE  Pertinent labs & imaging results that were available during my care of the patient were  reviewed by me and considered in my medical decision making (see chart for details).   Presented to the emergency department today because of concerns for abdominal pain.  CT scan was obtained to evaluate for intra-abdominal infection.  Did show colitis.  Will plan on starting patient on antibiotics as well as medication to help with abdominal discomfort and nausea.  Discussed findings and plan with patient.  Discussed return precautions.    ____________________________________________   FINAL CLINICAL IMPRESSION(S) / ED DIAGNOSES  Final diagnoses:  Abdominal pain, unspecified abdominal location  Colitis     Note: This dictation was prepared with Dragon dictation. Any transcriptional errors that result from this process are unintentional     Nance Pear, MD 07/25/18 1506

## 2018-07-27 ENCOUNTER — Other Ambulatory Visit: Payer: Medicare Other

## 2018-07-28 ENCOUNTER — Ambulatory Visit: Payer: Medicare Other | Admitting: Hematology and Oncology

## 2018-07-28 ENCOUNTER — Ambulatory Visit: Payer: Medicare Other

## 2018-08-03 ENCOUNTER — Inpatient Hospital Stay: Payer: Medicare Other | Attending: Hematology and Oncology

## 2018-08-03 DIAGNOSIS — E785 Hyperlipidemia, unspecified: Secondary | ICD-10-CM | POA: Insufficient documentation

## 2018-08-03 DIAGNOSIS — R5383 Other fatigue: Secondary | ICD-10-CM | POA: Insufficient documentation

## 2018-08-03 DIAGNOSIS — Z87891 Personal history of nicotine dependence: Secondary | ICD-10-CM | POA: Diagnosis not present

## 2018-08-03 DIAGNOSIS — Z792 Long term (current) use of antibiotics: Secondary | ICD-10-CM | POA: Insufficient documentation

## 2018-08-03 DIAGNOSIS — D509 Iron deficiency anemia, unspecified: Secondary | ICD-10-CM | POA: Diagnosis not present

## 2018-08-03 DIAGNOSIS — Z7982 Long term (current) use of aspirin: Secondary | ICD-10-CM | POA: Diagnosis not present

## 2018-08-03 DIAGNOSIS — E538 Deficiency of other specified B group vitamins: Secondary | ICD-10-CM | POA: Diagnosis not present

## 2018-08-03 DIAGNOSIS — I129 Hypertensive chronic kidney disease with stage 1 through stage 4 chronic kidney disease, or unspecified chronic kidney disease: Secondary | ICD-10-CM | POA: Insufficient documentation

## 2018-08-03 DIAGNOSIS — K529 Noninfective gastroenteritis and colitis, unspecified: Secondary | ICD-10-CM | POA: Insufficient documentation

## 2018-08-03 DIAGNOSIS — Z8719 Personal history of other diseases of the digestive system: Secondary | ICD-10-CM | POA: Insufficient documentation

## 2018-08-03 DIAGNOSIS — Z79899 Other long term (current) drug therapy: Secondary | ICD-10-CM | POA: Diagnosis not present

## 2018-08-03 DIAGNOSIS — N183 Chronic kidney disease, stage 3 (moderate): Secondary | ICD-10-CM | POA: Diagnosis not present

## 2018-08-03 DIAGNOSIS — J449 Chronic obstructive pulmonary disease, unspecified: Secondary | ICD-10-CM | POA: Diagnosis not present

## 2018-08-03 DIAGNOSIS — D631 Anemia in chronic kidney disease: Secondary | ICD-10-CM | POA: Insufficient documentation

## 2018-08-03 DIAGNOSIS — M810 Age-related osteoporosis without current pathological fracture: Secondary | ICD-10-CM | POA: Insufficient documentation

## 2018-08-03 DIAGNOSIS — R5381 Other malaise: Secondary | ICD-10-CM | POA: Diagnosis not present

## 2018-08-03 LAB — COMPREHENSIVE METABOLIC PANEL
ALT: 8 U/L (ref 0–44)
AST: 16 U/L (ref 15–41)
Albumin: 4.2 g/dL (ref 3.5–5.0)
Alkaline Phosphatase: 65 U/L (ref 38–126)
Anion gap: 11 (ref 5–15)
BUN: 33 mg/dL — ABNORMAL HIGH (ref 8–23)
CO2: 22 mmol/L (ref 22–32)
Calcium: 9.6 mg/dL (ref 8.9–10.3)
Chloride: 106 mmol/L (ref 98–111)
Creatinine, Ser: 1.37 mg/dL — ABNORMAL HIGH (ref 0.44–1.00)
GFR calc Af Amer: 43 mL/min — ABNORMAL LOW (ref >60)
GFR calc non Af Amer: 37 mL/min — ABNORMAL LOW (ref >60)
Glucose, Bld: 109 mg/dL — ABNORMAL HIGH (ref 70–99)
Potassium: 5.1 mmol/L (ref 3.5–5.1)
Sodium: 139 mmol/L (ref 135–145)
Total Bilirubin: 0.4 mg/dL (ref 0.3–1.2)
Total Protein: 7.9 g/dL (ref 6.5–8.1)

## 2018-08-03 LAB — FERRITIN: Ferritin: 27 ng/mL (ref 11–307)

## 2018-08-03 LAB — VITAMIN B12: Vitamin B-12: 1080 pg/mL — ABNORMAL HIGH (ref 180–914)

## 2018-08-03 LAB — CBC WITH DIFFERENTIAL/PLATELET
Abs Immature Granulocytes: 0.03 10*3/uL (ref 0.00–0.07)
Basophils Absolute: 0.1 10*3/uL (ref 0.0–0.1)
Basophils Relative: 1 %
Eosinophils Absolute: 0.3 10*3/uL (ref 0.0–0.5)
Eosinophils Relative: 4 %
HCT: 30.5 % — ABNORMAL LOW (ref 36.0–46.0)
Hemoglobin: 8.8 g/dL — ABNORMAL LOW (ref 12.0–15.0)
Immature Granulocytes: 0 %
Lymphocytes Relative: 16 %
Lymphs Abs: 1.3 10*3/uL (ref 0.7–4.0)
MCH: 23.5 pg — ABNORMAL LOW (ref 26.0–34.0)
MCHC: 28.9 g/dL — ABNORMAL LOW (ref 30.0–36.0)
MCV: 81.6 fL (ref 80.0–100.0)
Monocytes Absolute: 0.7 10*3/uL (ref 0.1–1.0)
Monocytes Relative: 9 %
Neutro Abs: 5.9 10*3/uL (ref 1.7–7.7)
Neutrophils Relative %: 70 %
Platelets: 215 10*3/uL (ref 150–400)
RBC: 3.74 MIL/uL — ABNORMAL LOW (ref 3.87–5.11)
RDW: 17.1 % — ABNORMAL HIGH (ref 11.5–15.5)
WBC: 8.4 10*3/uL (ref 4.0–10.5)
nRBC: 0 % (ref 0.0–0.2)

## 2018-08-04 ENCOUNTER — Inpatient Hospital Stay (HOSPITAL_BASED_OUTPATIENT_CLINIC_OR_DEPARTMENT_OTHER): Payer: Medicare Other | Admitting: Hematology and Oncology

## 2018-08-04 ENCOUNTER — Encounter: Payer: Self-pay | Admitting: Hematology and Oncology

## 2018-08-04 ENCOUNTER — Inpatient Hospital Stay: Payer: Medicare Other

## 2018-08-04 VITALS — BP 139/79 | HR 84 | Temp 97.3°F | Resp 18 | Wt 152.1 lb

## 2018-08-04 DIAGNOSIS — M810 Age-related osteoporosis without current pathological fracture: Secondary | ICD-10-CM | POA: Diagnosis not present

## 2018-08-04 DIAGNOSIS — D509 Iron deficiency anemia, unspecified: Secondary | ICD-10-CM | POA: Insufficient documentation

## 2018-08-04 DIAGNOSIS — Z7982 Long term (current) use of aspirin: Secondary | ICD-10-CM

## 2018-08-04 DIAGNOSIS — K529 Noninfective gastroenteritis and colitis, unspecified: Secondary | ICD-10-CM

## 2018-08-04 DIAGNOSIS — Z87891 Personal history of nicotine dependence: Secondary | ICD-10-CM | POA: Diagnosis not present

## 2018-08-04 DIAGNOSIS — Z792 Long term (current) use of antibiotics: Secondary | ICD-10-CM | POA: Diagnosis not present

## 2018-08-04 DIAGNOSIS — R5381 Other malaise: Secondary | ICD-10-CM | POA: Diagnosis not present

## 2018-08-04 DIAGNOSIS — R5383 Other fatigue: Secondary | ICD-10-CM

## 2018-08-04 DIAGNOSIS — J449 Chronic obstructive pulmonary disease, unspecified: Secondary | ICD-10-CM | POA: Diagnosis not present

## 2018-08-04 DIAGNOSIS — E785 Hyperlipidemia, unspecified: Secondary | ICD-10-CM

## 2018-08-04 DIAGNOSIS — N183 Chronic kidney disease, stage 3 (moderate): Secondary | ICD-10-CM | POA: Diagnosis not present

## 2018-08-04 DIAGNOSIS — Z79899 Other long term (current) drug therapy: Secondary | ICD-10-CM | POA: Diagnosis not present

## 2018-08-04 DIAGNOSIS — Z8719 Personal history of other diseases of the digestive system: Secondary | ICD-10-CM | POA: Diagnosis not present

## 2018-08-04 DIAGNOSIS — E538 Deficiency of other specified B group vitamins: Secondary | ICD-10-CM

## 2018-08-04 DIAGNOSIS — D631 Anemia in chronic kidney disease: Secondary | ICD-10-CM | POA: Diagnosis not present

## 2018-08-04 DIAGNOSIS — I129 Hypertensive chronic kidney disease with stage 1 through stage 4 chronic kidney disease, or unspecified chronic kidney disease: Secondary | ICD-10-CM

## 2018-08-04 NOTE — Progress Notes (Signed)
[facility] day:  08/04/2018  Chief Complaint: Madison Franklin is a 72 y.o. female with iron deficiency anemia, B12 deficiency, and anemia of chronic kidney disease who is seen for 1 month assessment.  HPI:  The patient was last seen in the medical oncology clinic on 06/30/2018.  At that time, she felt her energy level was better.  She remained fatigued and had exertional dyspnea. She denied bleeding; no hematochezia, melena, or gross hematuria.  Exam was unremarkable.  We discussed correcting underlying iron and B12 deficiencies prior to additional Procrit injections.  Ferritin was 20.  She was encouraged to take oral iron supplement with a source of vitamin C daily.  If she was unable to tolerate oral iron, or ferritin did not improve, we discussed the need for intravenous iron replacement.  She was to call back regarding her multivitamin B12 content.  She began B12 1000 mcg po q day on 07/02/2018.  Patient was seen in the ED on 07/25/2018 by Dr. Nance Pear.  Notes reviewed.  Patient with severe suprapubic and left lower quadrant abdominal pain.  She denied urinary symptoms.  Labs were reassuring.  CT imaging of the abdomen and pelvis demonstrated an inflamed sigmoid and distal descending colon consistent with acute colitis.  There was wall thickening, mesenteric inflammation, and trace free fluid with no evidence of perforation or obstruction.  Patient was prescribed a 10-day course of oral Augmentin, ondansetron for nausea, and dicyclomine for abdominal cramping.  She was discharged home to follow-up with her primary care physician.    Labs on 08/03/2018 revealed a hematocrit of 30.5, hemoglobin 8.8, MCV 81.6, platelets 215,000, WBC 8400 with an ANC of 5900.  B12 was 1080.  Ferritin was 27.  Creatinine was 1.37.  During the interim, patient has been doing "about the same". She experienced fatigue and exertional dyspnea. Patient denies bleeding; no  hematochezia, melena, or gross hematuria.  Despite orders to begin oral iron, patient has not began daily supplementation. Patient denies any fevers, sweats,or significant bowel changes. Of note, patient remains on oral Augmentin course for colitis.   Patient advises that she maintains an adequate appetite. She is eating well. Weight today is 152 lb 1.9 oz (69 kg), which compared to her last visit to the clinic, represents a stable weight.     Patient denies pain in the clinic today.   Past Medical History:  Diagnosis Date  . COPD (chronic obstructive pulmonary disease) ([place])   . Hearing aid worn    bilateral  . Hyperlipidemia   . Hypertension   . Osteoporosis   . Personal history of tobacco use, presenting hazards to health 08/15/2015    Past Surgical History:  Procedure Laterality Date  . ABDOMINAL HYSTERECTOMY    . COLONOSCOPY WITH PROPOFOL N/A 11/14/2016   Procedure: COLONOSCOPY WITH PROPOFOL;  Surgeon: [name], MD;  Location: [facility];  Service: Endoscopy;  Laterality: N/A;  . long nodule    . POLYPECTOMY  11/14/2016   Procedure: POLYPECTOMY;  Surgeon: [name], MD;  Location: [facility];  Service: Endoscopy;;  . RIGHT HEART CATH Right 07/14/2017   Procedure: RIGHT HEART CATH;  Surgeon: Nelva Bush, MD;  Location: [facility];  Service: Cardiovascular;  Laterality: Right;    Family History  Problem Relation Age of Onset  . Heart disease Neg Hx     Social History:  reports that she quit smoking about 18 months ago. Her smoking  use included cigarettes. She has a 15.00 pack-year smoking history. She has never used smokeless tobacco. She reports that she does not drink alcohol or use drugs.  She smoked 1/2 pack/day x 30 years.  She quit in 2018.  She is a retired Secretary/administrator.  Her daughter's name is [name].  The patient is accompanied by her husband  today.  Allergies:  Allergies  Allergen Reactions  . Latex Itching and Rash    With latex  gloves    Current Medications: Current Outpatient Medications  Medication Sig Dispense Refill  . acetaminophen (TYLENOL) 325 MG tablet Take 325 mg by mouth every 6 (six) hours as needed (for pain.).    Marland Kitchen albuterol (PROVENTIL HFA;VENTOLIN HFA) 108 (90 Base) MCG/ACT inhaler Inhale 2 puffs into the lungs every 6 (six) hours as needed for wheezing or shortness of breath. 1 Inhaler 2  . amLODipine (NORVASC) 5 MG tablet Take 1 tablet (5 mg total) by mouth daily. 90 tablet 0  . amoxicillin-clavulanate (AUGMENTIN XR) 1000-62.5 MG 12 hr tablet Take 1 tablet by mouth 2 (two) times daily for 10 days. 20 tablet 0  . aspirin EC 81 MG tablet Take 81 mg by mouth daily.    Marland Kitchen atorvastatin (LIPITOR) 10 MG tablet Take 1 tablet (10 mg total) by mouth daily. 90 tablet 0  . dicyclomine (BENTYL) 20 MG tablet Take 1 tablet (20 mg total) by mouth 3 (three) times daily as needed (abdominal pain). 30 tablet 0  . fluticasone furoate-vilanterol (BREO ELLIPTA) 100-25 MCG/INH AEPB Inhale 1 puff into the lungs daily. 28 each 0  . hydrochlorothiazide (MICROZIDE) 12.5 MG capsule Take 1 capsule (12.5 mg total) by mouth daily. 90 capsule 1  . lisinopril (PRINIVIL,ZESTRIL) 40 MG tablet Take 1 tablet (40 mg total) by mouth daily. 90 tablet 0  . ondansetron (ZOFRAN) 4 MG tablet Take 1 tablet (4 mg total) by mouth every 8 (eight) hours as needed for nausea or vomiting. 20 tablet 0  . tiotropium (SPIRIVA HANDIHALER) 18 MCG inhalation capsule Place 1 capsule (18 mcg total) into inhaler and inhale daily. 90 capsule 3  . Vitamin D, Cholecalciferol, 1000 units TABS Take 1 tablet by mouth daily.     No current facility-administered medications for this visit.     Review of Systems  Constitutional: Positive for malaise/fatigue. Negative for chills, diaphoresis, fever and weight loss (stable).       Feels "about the same".  HENT: Positive for hearing loss. Negative for congestion, ear discharge, ear pain, nosebleeds, sinus pain and sore  throat.   Eyes: Negative.  Negative for double vision, photophobia, pain, discharge and redness.  Respiratory: Positive for shortness of breath (exertional). Negative for cough, hemoptysis and sputum production.   Cardiovascular: Negative for chest pain, palpitations, orthopnea, leg swelling and PND.  Gastrointestinal: Negative for abdominal pain, blood in stool, constipation, diarrhea, melena, nausea and vomiting.       Interval diagnosis of colitis.  Genitourinary: Negative for dysuria, frequency, hematuria and urgency.       CKD-III  Musculoskeletal: Negative for back pain, falls, joint pain and myalgias.  Skin: Negative.  Negative for itching and rash.  Neurological: Negative for dizziness, tingling, tremors, sensory change, speech change, focal weakness, weakness and headaches.  Endo/Heme/Allergies: Does not bruise/bleed easily.  Psychiatric/Behavioral: Negative for depression and memory loss. The patient is not nervous/anxious and does not have insomnia.        HYPERsomnia  All other systems reviewed and are negative.  Performance status (ECOG):  1 - Symptomatic but completely ambulatory  Vital Signs BP 139/79 (BP Location: Left Arm, Patient Position: Sitting)   Pulse 84   Temp (!) 97.3 F (36.3 C) (Tympanic)   Resp 18   Wt 152 lb 1.9 oz (69 kg)   LMP  (LMP Unknown)   SpO2 94%   BMI 28.74 kg/m   Physical Exam  Constitutional: She is oriented to person, place, and time and well-developed, well-nourished, and in no distress. No distress.  HENT:  Head: Normocephalic and atraumatic.  Mouth/Throat: Oropharynx is clear and moist and mucous membranes are normal. No oropharyngeal exudate.  Long graying hair. Hearing aides.   Eyes: Pupils are equal, round, and reactive to light. Conjunctivae and EOM are normal. No scleral icterus.  Neck: Normal range of motion. Neck supple. No JVD present.  Cardiovascular: Normal rate, regular rhythm, normal heart sounds and intact distal pulses.  Exam reveals no gallop and no friction rub.  No murmur heard. Pulmonary/Chest: Effort normal and breath sounds normal. No respiratory distress. She has no wheezes. She has no rales.  Abdominal: Soft. Bowel sounds are normal. She exhibits no distension and no mass. There is no tenderness. There is no rebound and no guarding.  Musculoskeletal: Normal range of motion. She exhibits no edema or tenderness.  Lymphadenopathy:       Head (right side): No submandibular, no preauricular, no posterior auricular and no occipital adenopathy present.       Head (left side): No submandibular, no preauricular, no posterior auricular and no occipital adenopathy present.    She has no cervical adenopathy.    She has no axillary adenopathy.       Right: No inguinal and no supraclavicular adenopathy present.       Left: No inguinal and no supraclavicular adenopathy present.  Neurological: She is alert and oriented to person, place, and time. Gait normal.  Skin: Skin is warm and dry. No rash noted. She is not diaphoretic. No erythema.  Psychiatric: Mood, affect and judgment normal.  Nursing note and vitals reviewed.   Appointment on 08/03/2018  Component Date Value Ref Range Status  . Vitamin B-12 08/03/2018 1,080* 180 - 914 pg/mL Final   Comment: (NOTE) This assay is not validated for testing neonatal or myeloproliferative syndrome specimens for Vitamin B12 levels. Performed at [facility], [place] [address], [place], [place] [address]   . Ferritin 08/03/2018 27  11 - 307 ng/mL Final   Performed at [facility], [address] [place], [place] [address]  . Sodium 08/03/2018 139  135 - 145 mmol/L Final  . Potassium 08/03/2018 5.1  3.5 - 5.1 mmol/L Final  . Chloride 08/03/2018 106  98 - 111 mmol/L Final  . CO2 08/03/2018 22  22 - 32 mmol/L Final  . Glucose, Bld 08/03/2018 109* 70 - 99 mg/dL Final  . BUN 08/03/2018 33* 8 - 23 mg/dL Final  . Creatinine, Ser 08/03/2018 1.37* 0.44 - 1.00  mg/dL Final  . Calcium 08/03/2018 9.6  8.9 - 10.3 mg/dL Final  . Total Protein 08/03/2018 7.9  6.5 - 8.1 g/dL Final  . Albumin 08/03/2018 4.2  3.5 - 5.0 g/dL Final  . AST 08/03/2018 16  15 - 41 U/L Final  . ALT 08/03/2018 8  0 - 44 U/L Final  . Alkaline Phosphatase 08/03/2018 65  38 - 126 U/L Final  . Total Bilirubin 08/03/2018 0.4  0.3 - 1.2 mg/dL Final  . GFR calc non Af Amer 08/03/2018 37* >  60 mL/min Final  . GFR calc Af Amer 08/03/2018 43* >60 mL/min Final   Comment: (NOTE) The eGFR has been calculated using the CKD EPI equation. This calculation has not been validated in all clinical situations. eGFR's persistently <60 mL/min signify possible Chronic Kidney Disease.   Georgiann Hahn gap 08/03/2018 11  5 - 15 Final   Performed at [facility], [address] [place], [place] [address]  . WBC 08/03/2018 8.4  4.0 - 10.5 K/uL Final  . RBC 08/03/2018 3.74* 3.87 - 5.11 MIL/uL Final  . Hemoglobin 08/03/2018 8.8* 12.0 - 15.0 g/dL Final  . HCT 08/03/2018 30.5* 36.0 - 46.0 % Final  . MCV 08/03/2018 81.6  80.0 - 100.0 fL Final  . MCH 08/03/2018 23.5* 26.0 - 34.0 pg Final  . MCHC 08/03/2018 28.9* 30.0 - 36.0 g/dL Final  . RDW 08/03/2018 17.1* 11.5 - 15.5 % Final  . Platelets 08/03/2018 215  150 - 400 K/uL Final  . nRBC 08/03/2018 0.0  0.0 - 0.2 % Final  . Neutrophils Relative % 08/03/2018 70  % Final  . Neutro Abs 08/03/2018 5.9  1.7 - 7.7 K/uL Final  . Lymphocytes Relative 08/03/2018 16  % Final  . Lymphs Abs 08/03/2018 1.3  0.7 - 4.0 K/uL Final  . Monocytes Relative 08/03/2018 9  % Final  . Monocytes Absolute 08/03/2018 0.7  0.1 - 1.0 K/uL Final  . Eosinophils Relative 08/03/2018 4  % Final  . Eosinophils Absolute 08/03/2018 0.3  0.0 - 0.5 K/uL Final  . Basophils Relative 08/03/2018 1  % Final  . Basophils Absolute 08/03/2018 0.1  0.0 - 0.1 K/uL Final  . Immature Granulocytes 08/03/2018 0  % Final  . Abs Immature Granulocytes 08/03/2018 0.03  0.00 - 0.07 K/uL Final    Performed at [facility], [address], [place], [place] [address]    Assessment:  Madison Franklin is a 73 y.o. female with anemia of chronic kidney disease.  She has had a gradual decline in renal function of the course of the last 8 months  Work-up on 05/26/2018 revealed a hematocrit of 33.5, hemoglobin 10.3, and MCV 78.4.  Ferritin was 20.  Iron saturation was 8% with a TIBC of 395.  B12 was 259 (low).  Folate was 10.3.  Creatinine was 1.51.  She has iron deficiency anemia and B12 deficiency.  She is on oral B12.  B12 was 259 on 05/26/2018 and 1080 on 08/03/2018.  Anti-parietal antibody and intrinsic factor antibodies were negative on 06/30/2018.  She has not started oral iron.  She has a history of colonic polyps.   Colonoscopy on 11/14/2016 revealed a sub-optimal prep. Findings included a 10 mm polyp in the cecum, seven 4-10 mm sessile polyps in the transverse colon, a 6 mm polyp in the descending colon, three 5-6 mm sessile polyps in the sigmoid colon,.  Pathology revealed 7 tubular adenomas, 2 hyperplastic polyps, and 2 inflammatory polyps.  She was scheduled to have a follow-up in 1 year.  She has a 15 pack year smoking history.  Chest CT on 03/13/2017 revealed a 4 mm LUL pulmonary nodule (decreased from prior).  Symptomatically, patient is doing about the same. She continues to be fatigued and exertionally dyspneic. She is taking supplemental B12, however failed to start the recommended iron. Patient denies that she has experienced any B symptoms. She is currently on Augmentin course for colitis. She denies abdominal pain and diarrhea. Exam is unremarkable.   Plan:  1. Review labs from 08/03/2018. 2. Anemia of chronic renal disease  Hemoglobin 8.8, hematocrit 30.5, MCV 81.6.  Discuss correcting underlying iron and B12 deficiencies prior to additional Procrit injections. 3. Iron deficiency  Ferritin low at 27 ng/mL.  Patient has not started oral iron supplementation with  a source of vitamin C as directed.  Discuss oral versus intravenous replacement.    She elects to try oral iron prior to proceeding with infusions.  Advised to start ferrous sulfate 325 mg daily with a source of vitamin C.   Will recheck labs in 1 month. If no improvement, discussed the need for intravenous replacement.   Venofer has already been preauthorized. 4. B12 deficiency  B12 level has significant improved to 1080 pg/mL on the prescribed oral supplementation.   Continue daily oral B12 supplement.  5. RTC in 1 month for MD assessment, labs (CBC with diff, ferritin, B12 - day before), and +/- Venofer.   Honor Loh, NP  08/04/2018, 10:32 AM   I saw and evaluated the patient, participating in the key portions of the service and reviewing pertinent diagnostic studies and records.  I reviewed the nurse practitioner's note and agree with the findings and the plan.  The assessment and plan were discussed with the patient.  Several questions were asked by the patient and answered.   Nolon Stalls, MD 08/04/2018,10:32 AM

## 2018-08-04 NOTE — Progress Notes (Signed)
Patient went to ED on 07-25-18 for abdominal pain.  She was diagnosed with colitis.  Patient is still on antibiotics.  States she is feeling much better.  Patient offers no complaints regarding her iron deficiency anemia.

## 2018-08-04 NOTE — Patient Instructions (Signed)
Get FERROUS SULFATE 325 mg and start taking daily. Need to take with vitamin C or orange juice to help with absorption.   Respectfully, Honor Loh, MSN, APRN, FNP-C, CEN  Oncology/Hematology Nurse Practitioner  [facility]  Iron Deficiency Anemia, Adult Iron-deficiency anemia is when you have a low amount of red blood cells or hemoglobin. This happens because you have too little iron in your body. Hemoglobin carries oxygen to parts of the body. Anemia can cause your body to not get enough oxygen. It may or may not cause symptoms. Follow these instructions at home: Medicines  Take over-the-counter and prescription medicines only as told by your doctor. This includes iron pills (supplements) and vitamins.  If you cannot handle taking iron pills by mouth, ask your doctor about getting iron through: ? A vein (intravenously). ? A shot (injection) into a muscle.  Take iron pills when your stomach is empty. If you cannot handle this, take them with food.  Do not drink milk or take antacids at the same time as your iron pills.  To prevent trouble pooping (constipation), eat fiber or take medicine (stool softener) as told by your doctor. Eating and drinking  Talk with your doctor before changing the foods you eat. He or she may tell you to eat foods that have a lot of iron, such as: ? Liver. ? Lowfat (lean) beef. ? Breads and cereals that have iron added to them (fortified breads and cereals). ? Eggs. ? Dried fruit. ? Dark green, leafy vegetables.  Drink enough fluid to keep your pee (urine) clear or pale yellow.  Eat fresh fruits and vegetables that are high in vitamin C. They help your body to use iron. Foods with a lot of vitamin C include: ? Oranges. ? Peppers. ? Tomatoes. ? Mangoes. General instructions  Return to your normal activities as told by your doctor. Ask your doctor what activities are safe for you.  Keep yourself clean, and keep  things clean around you (your surroundings). Anemia can make you get sick more easily.  Keep all follow-up visits as told by your doctor. This is important. Contact a doctor if:  You feel sick to your stomach (nauseous).  You throw up (vomit).  You feel weak.  You are sweating for no clear reason.  You have trouble pooping, such as: ? Pooping (having a bowel movement) less than 3 times a week. ? Straining to poop. ? Having poop that is hard, dry, or larger than normal. ? Feeling full or bloated. ? Pain in the lower belly. ? Not feeling better after pooping. Get help right away if:  You pass out (faint). If this happens, do not drive yourself to the hospital. Call your local emergency services (911 in the [place]  You have chest pain.  You have shortness of breath that: ? Is very bad. ? Gets worse with physical activity.  You have a fast heartbeat.  You get light-headed when getting up from sitting or lying down. This information is not intended to replace advice given to you by your health care provider. Make sure you discuss any questions you have with your health care provider. Document Released: 11/08/2010 Document Revised: 06/25/2016 Document Reviewed: 06/25/2016 Elsevier Interactive Patient Education  2017 [employer].

## 2018-08-19 ENCOUNTER — Telehealth: Payer: Self-pay | Admitting: Unknown Physician Specialty

## 2018-08-19 ENCOUNTER — Other Ambulatory Visit: Payer: Self-pay | Admitting: Nurse Practitioner

## 2018-08-19 DIAGNOSIS — F17200 Nicotine dependence, unspecified, uncomplicated: Secondary | ICD-10-CM

## 2018-08-19 DIAGNOSIS — I10 Essential (primary) hypertension: Secondary | ICD-10-CM

## 2018-08-19 MED ORDER — FLUTICASONE FUROATE-VILANTEROL 100-25 MCG/INH IN AEPB: 1.0000 | INHALATION_SPRAY | Freq: Every day | RESPIRATORY_TRACT | 2 refills | Status: DC

## 2018-08-19 MED ORDER — HYDROCHLOROTHIAZIDE 12.5 MG PO CAPS: 12.5000 mg | ORAL_CAPSULE | Freq: Every day | ORAL | 3 refills | Status: DC

## 2018-08-19 MED ORDER — ATORVASTATIN CALCIUM 10 MG PO TABS: 10.0000 mg | ORAL_TABLET | Freq: Every day | ORAL | 0 refills | Status: DC

## 2018-08-19 MED ORDER — LISINOPRIL 40 MG PO TABS: 40.0000 mg | ORAL_TABLET | Freq: Every day | ORAL | 3 refills | Status: DC

## 2018-08-19 MED ORDER — LISINOPRIL 40 MG PO TABS: 40.0000 mg | ORAL_TABLET | Freq: Every day | ORAL | 0 refills | Status: DC

## 2018-08-19 MED ORDER — AMLODIPINE BESYLATE 5 MG PO TABS: 5.0000 mg | ORAL_TABLET | Freq: Every day | ORAL | 3 refills | Status: DC

## 2018-08-19 MED ORDER — FLUTICASONE FUROATE-VILANTEROL 100-25 MCG/INH IN AEPB: 1.0000 | INHALATION_SPRAY | Freq: Every day | RESPIRATORY_TRACT | 0 refills | Status: DC

## 2018-08-19 MED ORDER — HYDROCHLOROTHIAZIDE 12.5 MG PO CAPS: 12.5000 mg | ORAL_CAPSULE | Freq: Every day | ORAL | 1 refills | Status: DC

## 2018-08-19 MED ORDER — AMLODIPINE BESYLATE 5 MG PO TABS: 5.0000 mg | ORAL_TABLET | Freq: Every day | ORAL | 0 refills | Status: DC

## 2018-08-19 MED ORDER — ATORVASTATIN CALCIUM 10 MG PO TABS: 10.0000 mg | ORAL_TABLET | Freq: Every day | ORAL | 3 refills | Status: DC

## 2018-08-19 NOTE — Telephone Encounter (Signed)
Refills provided and sent.

## 2018-08-19 NOTE — Telephone Encounter (Signed)
Copied from [place] [phone] [phone]. Topic: Quick Communication - Rx Refill/Question >> Aug 19, 2018 10:00 AM Margot Ables wrote: Medication: amlodipine, atorvastatin, hctz, and symbicort - pt will be out on 08/22/18 - OV scheduled with Julian Hy, NP on 08/27/18 Has the patient contacted their pharmacy? No - has not called pharmacy Preferred Pharmacy (with phone number or street name): [facility] [place], [place] [address] Phone: [phone] Fax: [phone]

## 2018-08-19 NOTE — Progress Notes (Signed)
Patient called in requesting refills on Atorvastatin, Amlodipine, Breo, HCTZ, Lisinopril.  Refills provided per request.  Has upcoming appointment with Malachy Mood.

## 2018-08-26 DIAGNOSIS — N2581 Secondary hyperparathyroidism of renal origin: Secondary | ICD-10-CM | POA: Diagnosis not present

## 2018-08-26 DIAGNOSIS — D631 Anemia in chronic kidney disease: Secondary | ICD-10-CM | POA: Diagnosis not present

## 2018-08-26 DIAGNOSIS — N183 Chronic kidney disease, stage 3 (moderate): Secondary | ICD-10-CM | POA: Diagnosis not present

## 2018-08-26 DIAGNOSIS — I10 Essential (primary) hypertension: Secondary | ICD-10-CM | POA: Diagnosis not present

## 2018-08-27 ENCOUNTER — Ambulatory Visit: Payer: Medicare Other | Admitting: Unknown Physician Specialty

## 2018-08-31 ENCOUNTER — Inpatient Hospital Stay: Payer: Medicare Other | Attending: Hematology and Oncology

## 2018-08-31 DIAGNOSIS — R5381 Other malaise: Secondary | ICD-10-CM | POA: Diagnosis not present

## 2018-08-31 DIAGNOSIS — D509 Iron deficiency anemia, unspecified: Secondary | ICD-10-CM | POA: Insufficient documentation

## 2018-08-31 DIAGNOSIS — D631 Anemia in chronic kidney disease: Secondary | ICD-10-CM

## 2018-08-31 DIAGNOSIS — R634 Abnormal weight loss: Secondary | ICD-10-CM | POA: Insufficient documentation

## 2018-08-31 DIAGNOSIS — N183 Chronic kidney disease, stage 3 (moderate): Secondary | ICD-10-CM | POA: Insufficient documentation

## 2018-08-31 DIAGNOSIS — M810 Age-related osteoporosis without current pathological fracture: Secondary | ICD-10-CM | POA: Diagnosis not present

## 2018-08-31 DIAGNOSIS — Z8719 Personal history of other diseases of the digestive system: Secondary | ICD-10-CM | POA: Diagnosis not present

## 2018-08-31 DIAGNOSIS — Z79899 Other long term (current) drug therapy: Secondary | ICD-10-CM | POA: Insufficient documentation

## 2018-08-31 DIAGNOSIS — E538 Deficiency of other specified B group vitamins: Secondary | ICD-10-CM | POA: Insufficient documentation

## 2018-08-31 DIAGNOSIS — J449 Chronic obstructive pulmonary disease, unspecified: Secondary | ICD-10-CM | POA: Diagnosis not present

## 2018-08-31 DIAGNOSIS — R5383 Other fatigue: Secondary | ICD-10-CM | POA: Diagnosis not present

## 2018-08-31 DIAGNOSIS — Z7982 Long term (current) use of aspirin: Secondary | ICD-10-CM | POA: Insufficient documentation

## 2018-08-31 DIAGNOSIS — Z87891 Personal history of nicotine dependence: Secondary | ICD-10-CM | POA: Diagnosis not present

## 2018-08-31 DIAGNOSIS — I129 Hypertensive chronic kidney disease with stage 1 through stage 4 chronic kidney disease, or unspecified chronic kidney disease: Secondary | ICD-10-CM | POA: Diagnosis not present

## 2018-08-31 DIAGNOSIS — E785 Hyperlipidemia, unspecified: Secondary | ICD-10-CM | POA: Diagnosis not present

## 2018-08-31 DIAGNOSIS — R1901 Right upper quadrant abdominal swelling, mass and lump: Secondary | ICD-10-CM | POA: Insufficient documentation

## 2018-08-31 LAB — CBC WITH DIFFERENTIAL/PLATELET
Abs Immature Granulocytes: 0.02 10*3/uL (ref 0.00–0.07)
Basophils Absolute: 0.1 10*3/uL (ref 0.0–0.1)
Basophils Relative: 2 %
Eosinophils Absolute: 0.6 10*3/uL — ABNORMAL HIGH (ref 0.0–0.5)
Eosinophils Relative: 8 %
HCT: 33.5 % — ABNORMAL LOW (ref 36.0–46.0)
Hemoglobin: 9.7 g/dL — ABNORMAL LOW (ref 12.0–15.0)
Immature Granulocytes: 0 %
Lymphocytes Relative: 17 %
Lymphs Abs: 1.3 10*3/uL (ref 0.7–4.0)
MCH: 24.5 pg — ABNORMAL LOW (ref 26.0–34.0)
MCHC: 29 g/dL — ABNORMAL LOW (ref 30.0–36.0)
MCV: 84.6 fL (ref 80.0–100.0)
Monocytes Absolute: 0.5 10*3/uL (ref 0.1–1.0)
Monocytes Relative: 7 %
Neutro Abs: 5.3 10*3/uL (ref 1.7–7.7)
Neutrophils Relative %: 66 %
Platelets: 203 10*3/uL (ref 150–400)
RBC: 3.96 MIL/uL (ref 3.87–5.11)
RDW: 17.7 % — ABNORMAL HIGH (ref 11.5–15.5)
WBC: 7.9 10*3/uL (ref 4.0–10.5)
nRBC: 0 % (ref 0.0–0.2)

## 2018-08-31 LAB — FERRITIN: Ferritin: 34 ng/mL (ref 11–307)

## 2018-09-01 ENCOUNTER — Encounter: Payer: Self-pay | Admitting: Hematology and Oncology

## 2018-09-01 ENCOUNTER — Inpatient Hospital Stay (HOSPITAL_BASED_OUTPATIENT_CLINIC_OR_DEPARTMENT_OTHER): Payer: Medicare Other | Admitting: Hematology and Oncology

## 2018-09-01 ENCOUNTER — Other Ambulatory Visit: Payer: Self-pay

## 2018-09-01 ENCOUNTER — Inpatient Hospital Stay: Payer: Medicare Other

## 2018-09-01 VITALS — BP 140/68 | HR 79 | Temp 97.5°F | Resp 18 | Wt 149.5 lb

## 2018-09-01 DIAGNOSIS — R5383 Other fatigue: Secondary | ICD-10-CM

## 2018-09-01 DIAGNOSIS — Z79899 Other long term (current) drug therapy: Secondary | ICD-10-CM | POA: Diagnosis not present

## 2018-09-01 DIAGNOSIS — Z8719 Personal history of other diseases of the digestive system: Secondary | ICD-10-CM | POA: Diagnosis not present

## 2018-09-01 DIAGNOSIS — N183 Chronic kidney disease, stage 3 unspecified: Secondary | ICD-10-CM

## 2018-09-01 DIAGNOSIS — Z87891 Personal history of nicotine dependence: Secondary | ICD-10-CM

## 2018-09-01 DIAGNOSIS — I129 Hypertensive chronic kidney disease with stage 1 through stage 4 chronic kidney disease, or unspecified chronic kidney disease: Secondary | ICD-10-CM

## 2018-09-01 DIAGNOSIS — R1901 Right upper quadrant abdominal swelling, mass and lump: Secondary | ICD-10-CM | POA: Diagnosis not present

## 2018-09-01 DIAGNOSIS — D509 Iron deficiency anemia, unspecified: Secondary | ICD-10-CM

## 2018-09-01 DIAGNOSIS — R5381 Other malaise: Secondary | ICD-10-CM | POA: Diagnosis not present

## 2018-09-01 DIAGNOSIS — M810 Age-related osteoporosis without current pathological fracture: Secondary | ICD-10-CM

## 2018-09-01 DIAGNOSIS — E785 Hyperlipidemia, unspecified: Secondary | ICD-10-CM | POA: Diagnosis not present

## 2018-09-01 DIAGNOSIS — J449 Chronic obstructive pulmonary disease, unspecified: Secondary | ICD-10-CM | POA: Diagnosis not present

## 2018-09-01 DIAGNOSIS — Z7982 Long term (current) use of aspirin: Secondary | ICD-10-CM

## 2018-09-01 DIAGNOSIS — D631 Anemia in chronic kidney disease: Secondary | ICD-10-CM | POA: Diagnosis not present

## 2018-09-01 DIAGNOSIS — E538 Deficiency of other specified B group vitamins: Secondary | ICD-10-CM | POA: Diagnosis not present

## 2018-09-01 DIAGNOSIS — R634 Abnormal weight loss: Secondary | ICD-10-CM

## 2018-09-01 LAB — VITAMIN B12: Vitamin B-12: 1075 pg/mL — ABNORMAL HIGH (ref 180–914)

## 2018-09-01 NOTE — Progress Notes (Signed)
Here for follow up . Per pt " im doing ok "  

## 2018-09-01 NOTE — Progress Notes (Signed)
[facility] day:  09/01/2018  Chief Complaint: Madison Franklin is a 72 y.o. female with anemia of chronic kidney disease, iron deficiency, and B12 deficiency who is seen for 1 month assessment.  HPI:  The patient was last seen in the medical oncology clinic on 08/04/2018.  At that time, she was doing about the same. She continued to be fatigued and exertionally dyspneic. She was taking supplemental B12, however she had failed to start the recommended iron. Patient denied any B symptoms. She was currently on Augmentin course for colitis. She denied abdominal pain and diarrhea. Exam was unremarkable.   Labs on 08/31/2018 revealed a hematocrit of 33.5, hemoglobin 9.7, MCV 84.6, platelets 203,000, WBC 7900 with an Adams of 5300.  B12 was 1075.  Ferritin was 34.  During the interim, she has been doing well overall. She continues to note fatigue and exertional dyspnea, however she is able to appreciate some improvements. Patient denies bleeding; no hematochezia, melena, or gross hematuria. Patient denies that she has experienced any B symptoms. She denies any interval infections.   Patient advises that she maintains an adequate appetite. She is eating well. Patient making efforts to incorporate more iron into her diet. She eats meat and green leafy vegetables "about twice a week". Weight today is 149 lb 7.6 oz (67.8 kg), which compared to her last visit to the clinic, represents a 3 pound decrease.   Patient denies pain in the clinic today.   Past Medical History:  Diagnosis Date  . COPD (chronic obstructive pulmonary disease) ([place])   . Hearing aid worn    bilateral  . Hyperlipidemia   . Hypertension   . Osteoporosis   . Personal history of tobacco use, presenting hazards to health 08/15/2015    Past Surgical History:  Procedure Laterality Date  . ABDOMINAL HYSTERECTOMY    . COLONOSCOPY WITH PROPOFOL N/A 11/14/2016   Procedure: COLONOSCOPY WITH  PROPOFOL;  Surgeon: [name], MD;  Location: [facility];  Service: Endoscopy;  Laterality: N/A;  . long nodule    . POLYPECTOMY  11/14/2016   Procedure: POLYPECTOMY;  Surgeon: [name], MD;  Location: [facility];  Service: Endoscopy;;  . RIGHT HEART CATH Right 07/14/2017   Procedure: RIGHT HEART CATH;  Surgeon: Nelva Bush, MD;  Location: [facility];  Service: Cardiovascular;  Laterality: Right;    Family History  Problem Relation Age of Onset  . Heart disease Neg Hx     Social History:  reports that she quit smoking about 19 months ago. Her smoking use included cigarettes. She has a 15.00 pack-year smoking history. She has never used smokeless tobacco. She reports that she does not drink alcohol or use drugs.  She smoked 1/2 pack/day x 30 years.  She quit in 2018.  She is a retired Secretary/administrator.  The patient is accompanied by her husband, Elenore Rota,  today.  Allergies:  Allergies  Allergen Reactions  . Latex Itching and Rash    With latex gloves    Current Medications: Current Outpatient Medications  Medication Sig Dispense Refill  . amLODipine (NORVASC) 5 MG tablet Take 1 tablet (5 mg total) by mouth daily. 90 tablet 3  . aspirin EC 81 MG tablet Take 81 mg by mouth daily.    Marland Kitchen atorvastatin (LIPITOR) 10 MG tablet Take 1 tablet (10 mg total) by mouth daily. 90 tablet 3  . lisinopril (PRINIVIL,ZESTRIL) 40 MG tablet Take 1 tablet (40 mg  total) by mouth daily. 90 tablet 3  . Vitamin D, Cholecalciferol, 1000 units TABS Take 1 tablet by mouth daily.    Marland Kitchen acetaminophen (TYLENOL) 325 MG tablet Take 325 mg by mouth every 6 (six) hours as needed (for pain.).    Marland Kitchen albuterol (PROVENTIL HFA;VENTOLIN HFA) 108 (90 Base) MCG/ACT inhaler Inhale 2 puffs into the lungs every 6 (six) hours as needed for wheezing or shortness of breath. (Patient not taking: Reported on 09/01/2018) 1 Inhaler 2  . dicyclomine (BENTYL) 20 MG tablet Take 1 tablet (20 mg total) by mouth 3  (three) times daily as needed (abdominal pain). (Patient not taking: Reported on 09/01/2018) 30 tablet 0  . fluticasone furoate-vilanterol (BREO ELLIPTA) 100-25 MCG/INH AEPB Inhale 1 puff into the lungs daily. (Patient not taking: Reported on 09/01/2018) 28 each 2  . hydrochlorothiazide (MICROZIDE) 12.5 MG capsule Take 1 capsule (12.5 mg total) by mouth daily. (Patient not taking: Reported on 09/01/2018) 90 capsule 3  . ondansetron (ZOFRAN) 4 MG tablet Take 1 tablet (4 mg total) by mouth every 8 (eight) hours as needed for nausea or vomiting. (Patient not taking: Reported on 09/01/2018) 20 tablet 0  . tiotropium (SPIRIVA HANDIHALER) 18 MCG inhalation capsule Place 1 capsule (18 mcg total) into inhaler and inhale daily. (Patient not taking: Reported on 09/01/2018) 90 capsule 3   No current facility-administered medications for this visit.     Review of Systems  Constitutional: Positive for malaise/fatigue and weight loss (down 3 pounds). Negative for diaphoresis and fever.       Notes "some improvements".  HENT: Positive for hearing loss. Negative for congestion, ear discharge, ear pain, nosebleeds, sinus pain and sore throat.   Eyes: Negative.  Negative for double vision, photophobia and pain.  Respiratory: Positive for shortness of breath (exertional-improved). Negative for cough, hemoptysis and sputum production.   Cardiovascular: Negative.  Negative for chest pain, palpitations, orthopnea, leg swelling and PND.  Gastrointestinal: Negative.  Negative for abdominal pain, blood in stool, constipation, diarrhea, melena, nausea and vomiting.  Genitourinary: Negative for dysuria, frequency, hematuria and urgency.       CKD-III  Musculoskeletal: Negative.  Negative for back pain, falls, joint pain and myalgias.  Skin: Negative for itching and rash.  Neurological: Negative for dizziness, tremors, sensory change, speech change, focal weakness, weakness and headaches.  Endo/Heme/Allergies: Does not  bruise/bleed easily.  Psychiatric/Behavioral: Negative for depression, memory loss and suicidal ideas. The patient is not nervous/anxious and does not have insomnia.   All other systems reviewed and are negative.  Performance status (ECOG): 1 - Symptomatic but completely ambulatory  Vital Signs BP 140/68 (BP Location: Left Arm, Patient Position: Sitting)   Pulse 79   Temp (!) 97.5 F (36.4 C) (Tympanic)   Resp 18   Wt 149 lb 7.6 oz (67.8 kg)   LMP  (LMP Unknown)   BMI 28.24 kg/m   Physical Exam  Constitutional: She is well-developed, well-nourished, and in no distress. No distress.  HENT:  Head: Normocephalic and atraumatic.  Mouth/Throat: Oropharynx is clear and moist and mucous membranes are normal. No oropharyngeal exudate.  Long gray hair.  Eyes: Pupils are equal, round, and reactive to light. Conjunctivae and EOM are normal. No scleral icterus.  Neck: Normal range of motion. Neck supple. No JVD present.  Cardiovascular: Normal rate, regular rhythm, normal heart sounds and intact distal pulses. Exam reveals no gallop and no friction rub.  No murmur heard. Pulmonary/Chest: Effort normal and breath sounds normal. No respiratory distress.  She has no wheezes. She has no rales.  Abdominal: Soft. Bowel sounds are normal. She exhibits distension and mass (RUQ blottable mass (? porcelain gallbladder)). There is no tenderness.  Musculoskeletal: Normal range of motion. She exhibits no edema or tenderness.  Lymphadenopathy:    She has no cervical adenopathy.    She has no axillary adenopathy.       Right: No inguinal and no supraclavicular adenopathy present.       Left: No inguinal and no supraclavicular adenopathy present.  Neurological: She is alert. Gait normal.  Skin: Skin is warm and dry. No rash noted. She is not diaphoretic. No erythema.  Psychiatric: Mood, affect and judgment normal.  Nursing note and vitals reviewed.   Appointment on 08/31/2018  Component Date Value Ref  Range Status  . Vitamin B-12 08/31/2018 1,075* 180 - 914 pg/mL Final   Comment: (NOTE) This assay is not validated for testing neonatal or myeloproliferative syndrome specimens for Vitamin B12 levels. Performed at [facility], [place] [address], [place], [place] [address]   . Ferritin 08/31/2018 34  11 - 307 ng/mL Final   Performed at [facility], [address] [place], [place] [address]  . WBC 08/31/2018 7.9  4.0 - 10.5 K/uL Final  . RBC 08/31/2018 3.96  3.87 - 5.11 MIL/uL Final  . Hemoglobin 08/31/2018 9.7* 12.0 - 15.0 g/dL Final  . HCT 08/31/2018 33.5* 36.0 - 46.0 % Final  . MCV 08/31/2018 84.6  80.0 - 100.0 fL Final  . MCH 08/31/2018 24.5* 26.0 - 34.0 pg Final  . MCHC 08/31/2018 29.0* 30.0 - 36.0 g/dL Final  . RDW 08/31/2018 17.7* 11.5 - 15.5 % Final  . Platelets 08/31/2018 203  150 - 400 K/uL Final  . nRBC 08/31/2018 0.0  0.0 - 0.2 % Final  . Neutrophils Relative % 08/31/2018 66  % Final  . Neutro Abs 08/31/2018 5.3  1.7 - 7.7 K/uL Final  . Lymphocytes Relative 08/31/2018 17  % Final  . Lymphs Abs 08/31/2018 1.3  0.7 - 4.0 K/uL Final  . Monocytes Relative 08/31/2018 7  % Final  . Monocytes Absolute 08/31/2018 0.5  0.1 - 1.0 K/uL Final  . Eosinophils Relative 08/31/2018 8  % Final  . Eosinophils Absolute 08/31/2018 0.6* 0.0 - 0.5 K/uL Final  . Basophils Relative 08/31/2018 2  % Final  . Basophils Absolute 08/31/2018 0.1  0.0 - 0.1 K/uL Final  . Immature Granulocytes 08/31/2018 0  % Final  . Abs Immature Granulocytes 08/31/2018 0.02  0.00 - 0.07 K/uL Final   Performed at [facility], [address] [place], [place] [address]    Assessment:  VERDIA BOLT is a 72 y.o. female with anemia of chronic kidney disease.  She has had a gradual decline in renal function of the course of the last 8 months  Work-up on 05/26/2018 revealed a hematocrit of 33.5, hemoglobin 10.3, and MCV 78.4.  Ferritin was 20.  Iron saturation was 8% with a TIBC of 395.  B12  was 259 (low).  Folate was 10.3.  Creatinine was 1.51.  She has iron deficiency anemia and B12 deficiency.  She is on oral B12.  B12 was 259 on 05/26/2018 and 1080 on 08/03/2018.  Anti-parietal antibody and intrinsic factor antibodies were negative on 06/30/2018.  Oral iron began 08/04/2018.  Ferritin has been followed: 31 on 11/24/2017, 20 on 05/26/2018, 17 on 06/30/2018, 27 on 08/03/2018, and 34 on 08/31/2018.  She has a history  of colonic polyps.   Colonoscopy on 11/14/2016 revealed a sub-optimal prep. Findings included a 10 mm polyp in the cecum, seven 4-10 mm sessile polyps in the transverse colon, a 6 mm polyp in the descending colon, three 5-6 mm sessile polyps in the sigmoid colon,.  Pathology revealed 7 tubular adenomas, 2 hyperplastic polyps, and 2 inflammatory polyps.  She was scheduled to have a follow-up in 1 year.  She has a 15 pack year smoking history.  Chest CT on 03/13/2017 revealed a 4 mm LUL pulmonary nodule (decreased from prior).  Low dose chest CT on 06/01/2018 revealed Lung-RADS 2, benign appearance or behavior.   There was gallbladder wall calcifications indicative of "porcelain gallbladder".   Abdomen and pelvic CT on 07/25/2018 revealed a chronically abnormal gallbladder with circumferential partially calcified wall thickening and heterogeneity. This was partially visible in 2016 and 2015.  There appears to be an exophytic 3 cm component of calcified soft tissue extending inferiorly from the main body of the gallbladder.  Obliterated lumen perhaps that is gallbladder fundus is uncertain.  Symptomatically, she notes some improvement to her energy level.  She continues to experience exertional dyspnea and orthopnea.  She is on oral B12 and iron supplementation at this point.  She denies any B symptoms.  Bowels are stable.  Exam reveals abdominal distention and a blottable mass in the RUQ.    Plan: 1. Review labs from 08/31/2018. 2. Anemia of chronic renal disease  Labs  reviewed.  CBC has improved.  Hemoglobin 9.7 and hematocrit 33.5.  Reviewed need to fully correct underlying iron and B12 deficiencies prior to additional Procrit injection. 3. Iron deficiency  Microcytosis improving (MCV 84.6)  Ferritin has increased from 27 to 34 ng/mL over the course of the last month on oral iron daily.  We will continue oral ferrous sulfate 325, with a source of vitamin C, at this point.  Discuss need for potential intravenous iron replacement in the future if ferritin does not improve.  Venofer has already been preauthorized. 4. B12 deficiency  B12 level 1075 pg/mL  Continues on daily oral B12 1000 mcg supplementation. 5. RUQ mass  Prominent mass in RUQ on today's exam.  Review of abdomen CT on 07/25/2018 and chest CT on 06/01/2018 revealed a porcelain gallbladder.  Patient does not provide symptoms of gallbladder disease.  Defer evaluation to patient's PCP.    May require surgical evaluation if patient felt to have selective mucosal calcification or incomplete mural calcification (higher risk of gallbladder cancer). 6. RTC in 6 weeks for labs (CBC with diff, ferritin) 7. RTC in 3 months for MD assessment, labs (CBC with diff, ferritin - day before), and +/- Venofer.    Honor Loh, NP  09/01/2018, 10:51 AM   I saw and evaluated the patient, participating in the key portions of the service and reviewing pertinent diagnostic studies and records.  I reviewed the nurse practitioner's note and agree with the findings and the plan.  The assessment and plan were discussed with the patient.  Multiple questions were asked by the patient and answered.   Nolon Stalls, MD 09/01/2018,10:51 AM

## 2018-09-08 ENCOUNTER — Encounter: Payer: Self-pay | Admitting: Hematology and Oncology

## 2018-09-09 ENCOUNTER — Telehealth: Payer: Self-pay | Admitting: Family Medicine

## 2018-09-09 DIAGNOSIS — R1901 Right upper quadrant abdominal swelling, mass and lump: Secondary | ICD-10-CM

## 2018-09-09 NOTE — Telephone Encounter (Signed)
-----   Message from Lequita Asal, MD sent at 09/08/2018  5:36 PM EST ----- Regarding: RUQ mass  Patient seen recently and has a prominent asymptomatic mass in the RUQ.  She has no gallbladder symptoms.  She may benefit from a surgical evaluation.  Melissa

## 2018-09-09 NOTE — Telephone Encounter (Signed)
Referral generated

## 2018-09-10 ENCOUNTER — Other Ambulatory Visit: Payer: Self-pay | Admitting: Hematology and Oncology

## 2018-09-12 IMAGING — CT CT CHEST LUNG CANCER SCREENING LOW DOSE W/O CM
2 of 5 series · 15 of 40 positions shown, 18 images · non-contrast
Comparison: 03/13/2017

CLINICAL DATA: Asymptomatic ex-smoker, quitting 1 year ago.
Thirty-one pack-year history.

EXAM:
CT CHEST WITHOUT CONTRAST LOW-DOSE FOR LUNG CANCER SCREENING
TECHNIQUE: Multidetector CT imaging of the chest was performed following the
standard protocol without IV contrast.

[Series 3: lung · axial · 0.59mm/px · z∈[-1177,-902]mm · 12 of 305 slices shown, 15 images (1 of 2)]
[im 15/305  mediastinal]
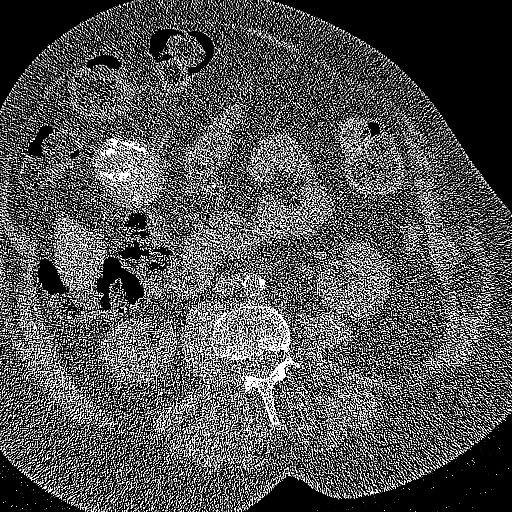
[im 15/305  lung]
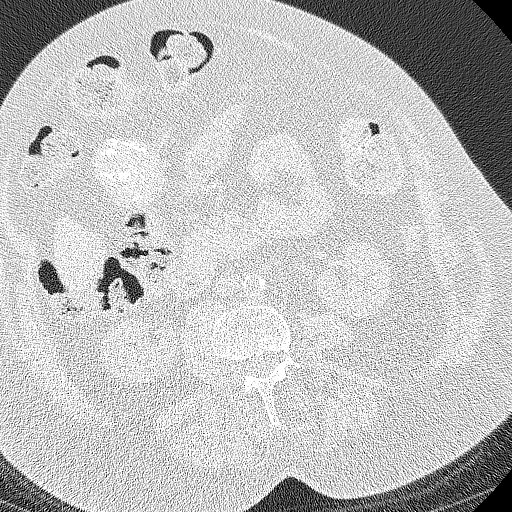
[im 44/305  lung]
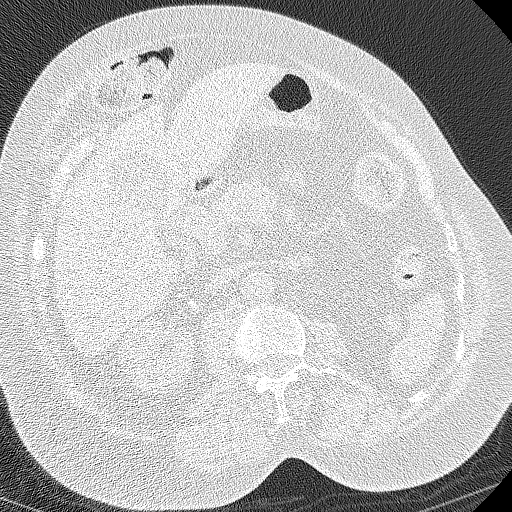
[im 73/305  lung]
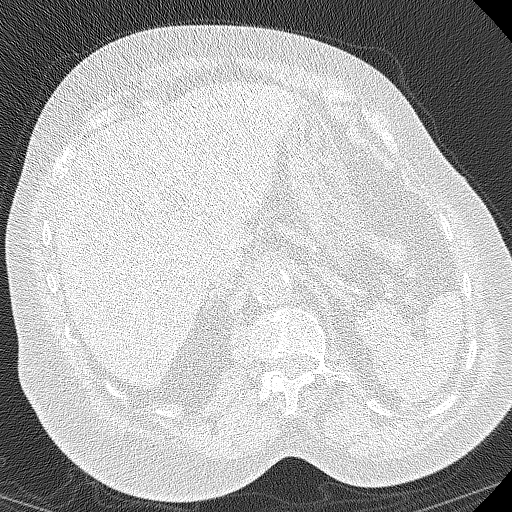
[im 87/305  lung]
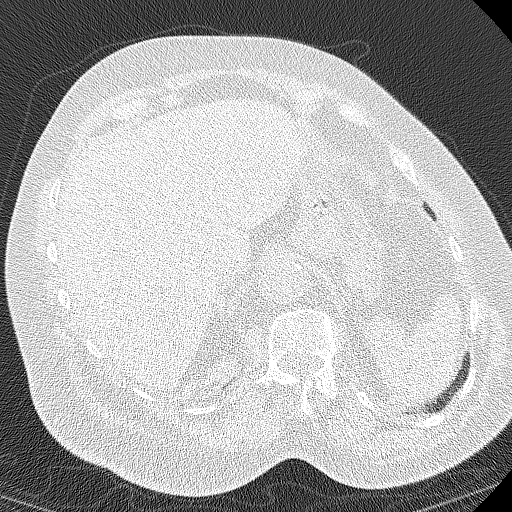
[im 116/305  mediastinal]
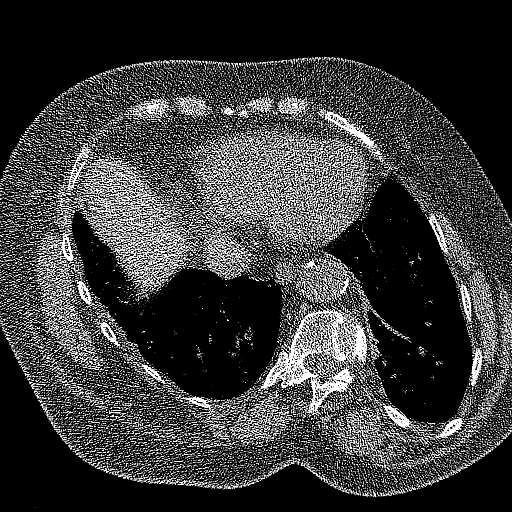
[im 116/305  lung]
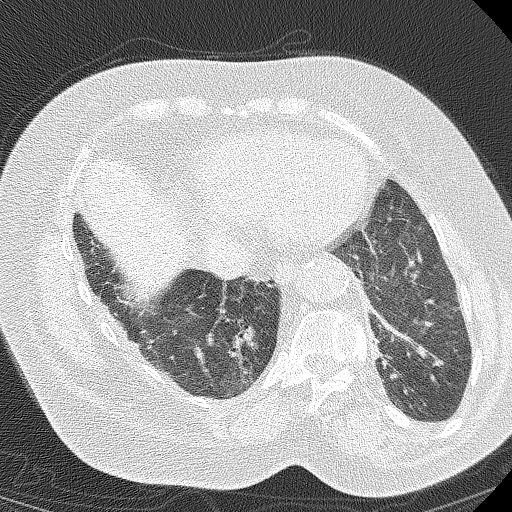
[im 145/305  lung]
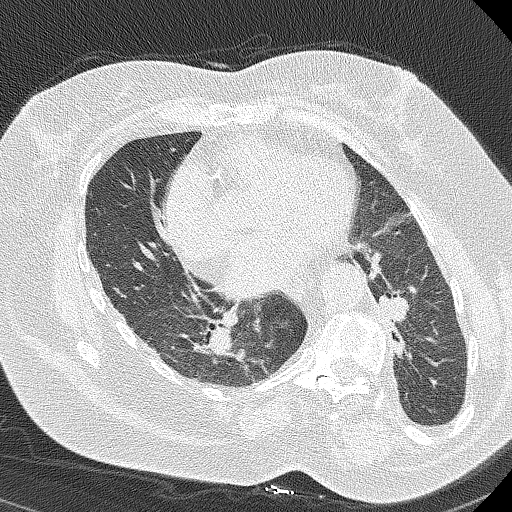
[im 160/305  lung]
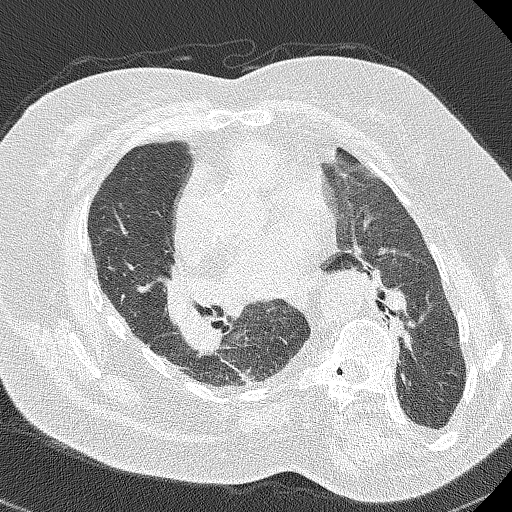
[im 189/305  lung]
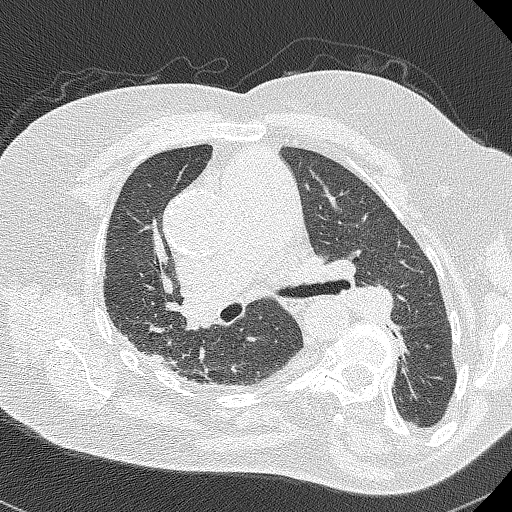
[im 218/305  mediastinal]
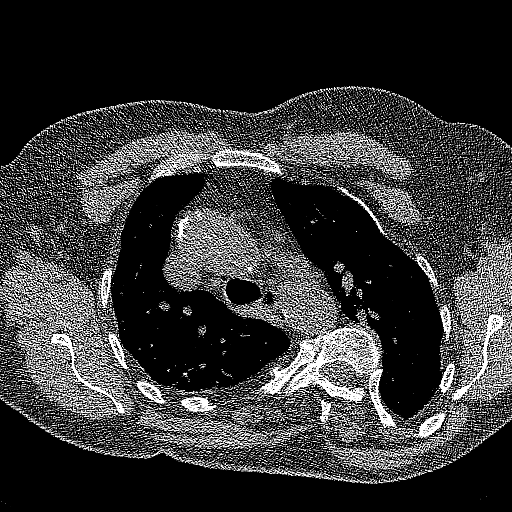
[im 218/305  lung]
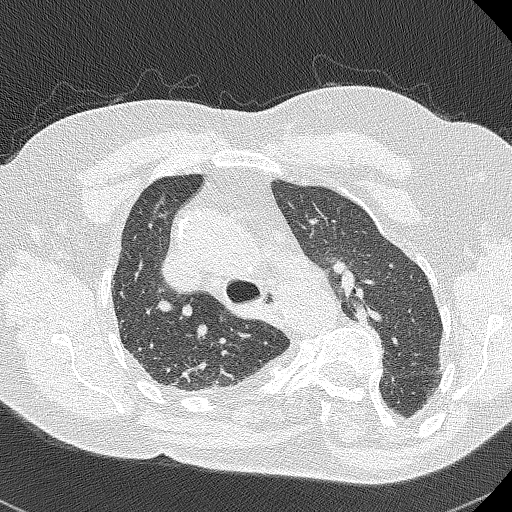
[im 232/305  lung]
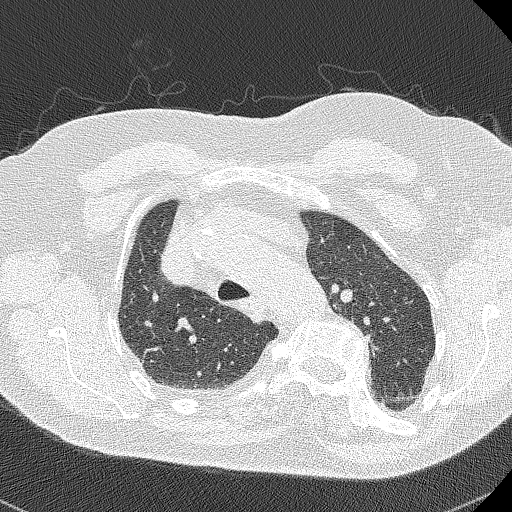
[im 261/305  lung]
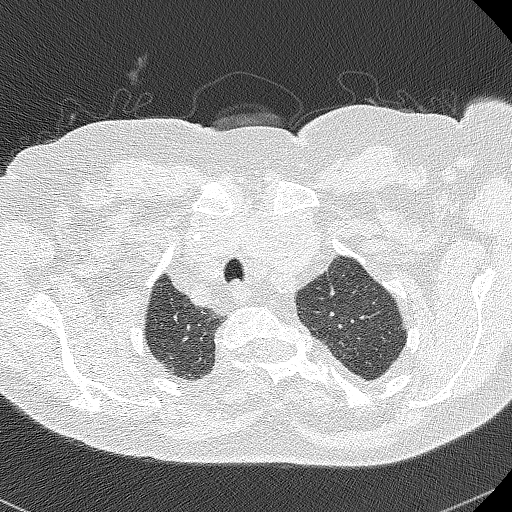
[im 290/305  lung]
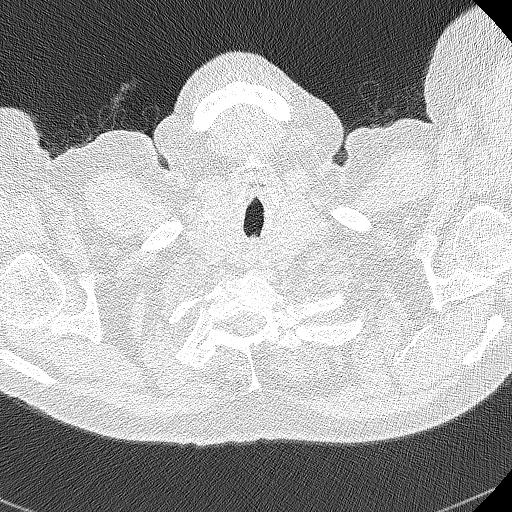

[Series 4: lung · coronal · 0.58mm/px · 3 of 283 slices shown (2 of 2)]
[im 57/283  lung]
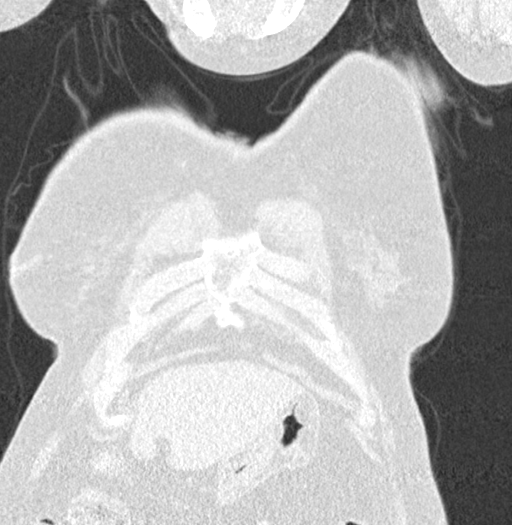
[im 113/283  lung]
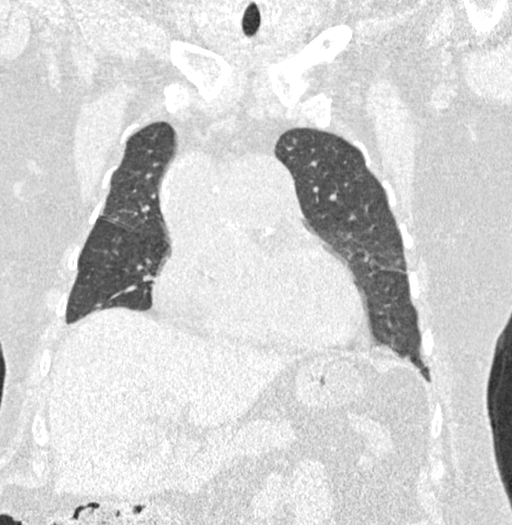
[im 170/283  lung]
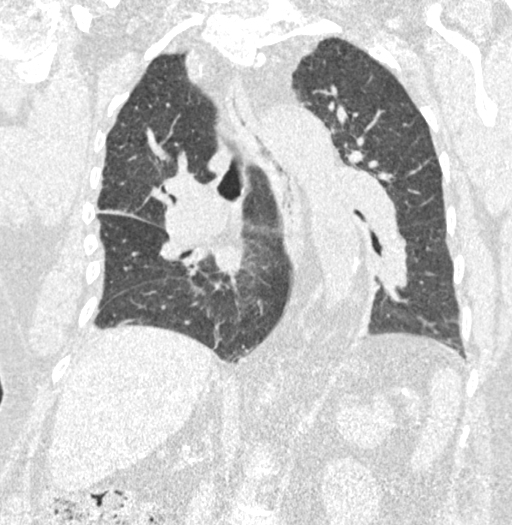

[15 of 40 positions shown; findings below may reference images not displayed]

FINDINGS: Cardiovascular: Aortic and branch vessel atherosclerosis. Tortuous
thoracic aorta. Ascending aorta upper normal size, 3.9 cm. Mild
cardiomegaly, without pericardial effusion. Lipomatous hypertrophy
of the interatrial septum. Multivessel coronary artery
atherosclerosis. Pulmonary artery enlargement again identified, with
the outflow tract at 4.1 cm.

Mediastinum/Nodes: Diffuse thyroid enlargement, with a subtle
hypoattenuating left-sided nodule of 3.0 cm, similar. A borderline
enlarged 10 mm precarinal node is enlarged from 8 mm on the prior.
Hilar regions poorly evaluated without intravenous contrast.

Lungs/Pleura: No pleural fluid. Mild centrilobular emphysema. Stable
small solid pulmonary nodules.

Bibasilar scarring.

Minimal motion degradation inferiorly.

A left lower lobe pulmonary nodule of volume derived equivalent
diameter 3.1 mm may be new since the prior exam, including on image
125/3.

Upper Abdomen: Calcified gallbladder wall. No surrounding
inflammation. Normal imaged portions of the liver, spleen, stomach,
pancreas, right adrenal gland, kidneys. Renal vascular
calcifications. Left adrenal thickening. Advanced abdominal aortic
atherosclerosis.

Musculoskeletal: Marked convex left thoracic spine curvature.
IMPRESSION: 1. Lung-RADS 2, benign appearance or behavior. Continue annual
screening with low-dose chest CT without contrast in 12 months.
2. Aortic atherosclerosis ([id number].P), coronary artery
atherosclerosis and emphysema ([id number].R).
3. Pulmonary artery enlargement suggests pulmonary arterial
hypertension.
4. Gallbladder wall calcifications indicative of "porcelain
gallbladder". Per consensus criteria, presuming the patient is
asymptomatic, this does not require dedicated imaging follow-up. If
any right upper quadrant symptoms, recommend dedicated ultrasound.
5. Enlargement of a precarinal node which is upper normal sized,
favored to be reactive.

## 2018-09-13 ENCOUNTER — Ambulatory Visit (INDEPENDENT_AMBULATORY_CARE_PROVIDER_SITE_OTHER): Payer: Medicare Other | Admitting: Surgery

## 2018-09-13 ENCOUNTER — Encounter: Payer: Self-pay | Admitting: *Deleted

## 2018-09-13 ENCOUNTER — Encounter: Payer: Self-pay | Admitting: Surgery

## 2018-09-13 ENCOUNTER — Other Ambulatory Visit: Payer: Self-pay

## 2018-09-13 VITALS — BP 151/73 | HR 87 | Temp 97.5°F | Resp 18 | Ht 61.0 in | Wt 149.6 lb

## 2018-09-13 DIAGNOSIS — K635 Polyp of colon: Secondary | ICD-10-CM | POA: Diagnosis not present

## 2018-09-13 NOTE — Progress Notes (Signed)
Pulmonary clearance request has been faxed to Dr. Alva Garnet office.   The patient is already scheduled for an appointment with his office on 09-21-18.  Freda Munro has also made a referral for the patient to be seen with Dr. Allen Norris at [facility].   Patient to follow up in the office with Dr. Dahlia Byes once colonoscopy is completed and pulmonary clearance is obtained. Follow up with our office is tentatively scheduled for 10-27-2018 pending the above.

## 2018-09-13 NOTE — Patient Instructions (Addendum)
WE will send the referral to  [facility]. Someone from their office will contact you to schedule an appointment. [phone] Once The colonoscopy has been done please call our office to schedule an appointment with Dr.Pabon.  We will also send Pulmonary Clearance.

## 2018-09-13 NOTE — Progress Notes (Signed)
Patient ID: Madison Franklin, female   DOB: 11/21/1945, 72 y.o.   MRN: [id number]  HPI Madison Franklin is a 72 y.o. female with a prior history of COPD, ex-smoker and severe scoliosis presents now with a prior episode of lower abdominal pain.  She reports that the pain was several weeks ago and the pain subsided.  No fevers no chills.  She reports no right upper quadrant or upper abdominal pain. Fevers no chills no evidence of jaundice or biliary obstruction.  No evidence of cholangitis.  No evidence of weight loss. Have as part of her work-up a CT scan of the abdomen and pelvis that I have personally reviewed showing evidence of porcelain gallbladder as well as some thickening of the sigmoid colon.  I have also pers. reviewed the images from a colonoscopy performed in January 2018 by Dr.Wohl, had a total of 12 polyps at that time the prep was suboptimal, path inflammatory and TA. Does have some significant dyspnea on exertion. Seen recently by Dr. Rosita Fire from pulmonary and in addition to that she had mild -moderate pulmonary hypertension, FEV1 1.28 ( 49%) She does Have some chronic renal insufficiency with a creatinine of 1.36. Hb 10.8 . Anemia being f/u hem/onc   HPI  Past Medical History:  Diagnosis Date  . COPD (chronic obstructive pulmonary disease) ([place])   . Hearing aid worn    bilateral  . Hyperlipidemia   . Hypertension   . Osteoporosis   . Personal history of tobacco use, presenting hazards to health 08/15/2015    Past Surgical History:  Procedure Laterality Date  . ABDOMINAL HYSTERECTOMY    . COLONOSCOPY WITH PROPOFOL N/A 11/14/2016   Procedure: COLONOSCOPY WITH PROPOFOL;  Surgeon: [name], MD;  Location: [facility];  Service: Endoscopy;  Laterality: N/A;  . long nodule    . POLYPECTOMY  11/14/2016   Procedure: POLYPECTOMY;  Surgeon: [name], MD;  Location: [facility];  Service: Endoscopy;;  . RIGHT HEART CATH Right 07/14/2017   Procedure: RIGHT HEART CATH;   Surgeon: Nelva Bush, MD;  Location: [facility];  Service: Cardiovascular;  Laterality: Right;    Family History  Problem Relation Age of Onset  . Heart disease Neg Hx     Social History Social History   Tobacco Use  . Smoking status: Former Smoker    Packs/day: 0.50    Years: 30.00    Pack years: 15.00    Types: Cigarettes    Last attempt to quit: 01/17/2017    Years since quitting: 1.6  . Smokeless tobacco: Never Used  Substance Use Topics  . Alcohol use: No    Alcohol/week: 0.0 standard drinks  . Drug use: No    Allergies  Allergen Reactions  . Latex Itching and Rash    With latex gloves    Current Outpatient Medications  Medication Sig Dispense Refill  . acetaminophen (TYLENOL) 325 MG tablet Take 325 mg by mouth every 6 (six) hours as needed (for pain.).    Marland Kitchen albuterol (PROVENTIL HFA;VENTOLIN HFA) 108 (90 Base) MCG/ACT inhaler Inhale 2 puffs into the lungs every 6 (six) hours as needed for wheezing or shortness of breath. 1 Inhaler 2  . amLODipine (NORVASC) 5 MG tablet Take 1 tablet (5 mg total) by mouth daily. 90 tablet 3  . aspirin EC 81 MG tablet Take 81 mg by mouth daily.    Marland Kitchen atorvastatin (LIPITOR) 10 MG tablet Take 1 tablet (10 mg total) by mouth daily.  90 tablet 3  . dicyclomine (BENTYL) 20 MG tablet Take 1 tablet (20 mg total) by mouth 3 (three) times daily as needed (abdominal pain). 30 tablet 0  . fluticasone furoate-vilanterol (BREO ELLIPTA) 100-25 MCG/INH AEPB Inhale 1 puff into the lungs daily. 28 each 2  . hydrochlorothiazide (MICROZIDE) 12.5 MG capsule Take 1 capsule (12.5 mg total) by mouth daily. 90 capsule 3  . lisinopril (PRINIVIL,ZESTRIL) 40 MG tablet Take 1 tablet (40 mg total) by mouth daily. 90 tablet 3  . ondansetron (ZOFRAN) 4 MG tablet Take 1 tablet (4 mg total) by mouth every 8 (eight) hours as needed for nausea or vomiting. 20 tablet 0  . tiotropium (SPIRIVA HANDIHALER) 18 MCG inhalation capsule Place 1 capsule (18 mcg total)  into inhaler and inhale daily. 90 capsule 3  . Vitamin D, Cholecalciferol, 1000 units TABS Take 1 tablet by mouth daily.     No current facility-administered medications for this visit.      Review of Systems Full ROS  was asked and was negative except for the information on the HPI  Physical Exam Blood pressure (!) 151/73, pulse 87, temperature (!) 97.5 F (36.4 C), temperature source Temporal, resp. rate 18, height 5\' 1"  (1.549 m), weight 149 lb 9.6 oz (67.9 kg), SpO2 90 %. CONSTITUTIONAL: NAd, debilitated elderly female EYES: Pupils are equal, round, and reactive to light, Sclera are non-icteric. EARS, NOSE, MOUTH AND THROAT: The oropharynx is clear. The oral mucosa is pink and moist. Hearing is intact to voice. LYMPH NODES:  Lymph nodes in the neck are normal. RESPIRATORY:  Lungs are clear. There is normal respiratory effort, with equal breath sounds bilaterally, and without pathologic use of accessory muscles. CARDIOVASCULAR: Heart is regular without murmurs, gallops, or rubs. GI: The abdomen is soft, nontender, and nondistended.  There is diastases recti.  No Murphy no peritonitis  There are no palpable masses. There is no hepatosplenomegaly. There are normal bowel sounds in all quadrants. GU: Rectal deferred.   MUSCULOSKELETAL: Normal muscle strength and tone. No cyanosis or edema.   SKIN: Turgor is good and there are no pathologic skin lesions or ulcers. NEUROLOGIC: Motor and sensation is grossly normal. Cranial nerves are grossly intact. PSYCH:  Oriented to person, place and time. Affect is normal.  Data Reviewed  I have personally reviewed the patient's imaging, laboratory findings and medical records.    Assessment/Plan 72 year old female with multiple comorbidities including COPD, some dyspnea on exertion.  Now with porcelain gallbladder.recent evidence has shown that that the potential risk of developing malignancies among patients with PGB is much lower than previously  believed. However, there are as yet no clinical indicators to suggest which patients might develop malignancies in the future. I conveyed this to the patient in detail.  Another issue is that she does have significant dyspnea on exertion and we may have to request pulmonary clearance.   Fact that she developed some recent colitis and had multiple polyps I do recommend follow-up with a colonoscopy to make sure there is no evidence of any occult distal colonic malignancies.  We will make an appointment with Dr.Wohl. No need for emergent surgical intervention at tthis time. F/U in a few weeks with further w/u.   Caroleen Hamman, MD FACS General Surgeon 09/13/2018, 1:19 PM

## 2018-09-20 ENCOUNTER — Other Ambulatory Visit: Payer: Self-pay

## 2018-09-20 DIAGNOSIS — Z86010 Personal history of colonic polyps: Secondary | ICD-10-CM

## 2018-09-21 ENCOUNTER — Ambulatory Visit: Payer: Medicare Other | Admitting: Pulmonary Disease

## 2018-09-21 ENCOUNTER — Encounter: Payer: Self-pay | Admitting: Pulmonary Disease

## 2018-09-21 ENCOUNTER — Other Ambulatory Visit
Admission: RE | Admit: 2018-09-21 | Discharge: 2018-09-21 | Disposition: A | Discharge status: Home / Self Care | Payer: Medicare Other | Source: Ambulatory Visit | Attending: Pulmonary Disease | Admitting: Pulmonary Disease

## 2018-09-21 DIAGNOSIS — Z87891 Personal history of nicotine dependence: Secondary | ICD-10-CM | POA: Diagnosis not present

## 2018-09-21 DIAGNOSIS — Z01811 Encounter for preprocedural respiratory examination: Secondary | ICD-10-CM | POA: Diagnosis not present

## 2018-09-21 DIAGNOSIS — E010 Iodine-deficiency related diffuse (endemic) goiter: Secondary | ICD-10-CM | POA: Diagnosis not present

## 2018-09-21 DIAGNOSIS — R942 Abnormal results of pulmonary function studies: Secondary | ICD-10-CM | POA: Diagnosis not present

## 2018-09-21 DIAGNOSIS — R0609 Other forms of dyspnea: Secondary | ICD-10-CM

## 2018-09-21 DIAGNOSIS — J449 Chronic obstructive pulmonary disease, unspecified: Secondary | ICD-10-CM

## 2018-09-21 LAB — TSH: TSH: 0.797 u[IU]/mL (ref 0.350–4.500)

## 2018-09-21 NOTE — Patient Instructions (Addendum)
You would be at high risk for postoperative pulmonary (breathing) complications and therefore, I recommend not going through gallbladder surgery unless Dr. Dahlia Byes feels very strongly that it must be performed  Continue albuterol inhaler, 2 sprays up to 4 times per day as needed for shortness of breath, wheezing, cough, chest tightness  Thyroid function tests today (because of the finding of enlarged thyroid gland on CT scan of chest)  Follow-up in 6 months.  Call sooner if needed

## 2018-09-21 NOTE — Progress Notes (Signed)
PULMONARY OFFICE FOLLOW-UP NOTE  Requesting MD/Service: [name], NP Date of initial consultation: 05/11/17 Reason for consultation: Former smoker, exertional dyspnea  PT PROFILE: 72 y.o. female former smoker (approx 15 P-Y history) referred for evaluation of exertional dyspnea  PROBLEMS: Former smoker Severe thoracic kyphoscoliosis Moderate to severe restrictive pulmonary physiology Moderate pulmonary hypertension   DATA: CT chest 03/13/17: Severe thoracic kyphoscoliosis. 4 mm nodule in left upper lobe (decreased in size from previously). Minimal emphysema Echocardiogram 05/28/17: LVEF 60-65%.  Grade 1 diastolic dysfunction.  No significant valvular disease noted.  No evidence of right-sided overload. Madison Franklin 07/14/17: Mild to moderate pulmonary hypertension (mean PAP 34 mmHg, PVR 5.7 Wood units) PFTs 10/01/17: FVC: 1.28 L (49 %pred), FEV1: 0.83 > 0.91 L (41 > 45 %pred), FEV1/FVC: 65%; lung volume measurements invalid, DLCO 6.6 (31 %pred) ONO 10/26/17:  Minimal brief desaturation. Does not require nocturnal O2 LDCT chest 06/01/18: Lung-RADS 2, benign appearance or behavior. Aortic atherosclerosis, coronary artery atherosclerosis and emphysema. Pulmonary artery enlargement suggests pulmonary arterial hypertension. Gallbladder wall calcifications indicative of "porcelain gallbladder". Very large thyroid gland extending into upper mediastinum  INTERVAL: Last seen 05/05/18. No major pulmonary events.   SUBJ:  This is a belated follow up. She scheduled this appointment at the behest of Dr Dahlia Byes for pre-op pulmonary evaluation. In interim, she presented with lower abdominal pain and CT abdomen revealed "porcelain gallbladder" as well as changes of colitis. The GB findings had been noted before (on LDCT chest). Presently, she continues to have moderate to severe DOE (able to ambulate approx 50 meters on flat ground. There is little DTD variation. Previously, she felt that Breo was modestly  beneficial but she could not afford the co-pay. She is presently only using albuterol MDI as needed (approx 2x per day). She continues to deny CP, fever, purulent sputum, hemoptysis, LE edema and calf tenderness.   Vitals:   09/21/18 1426 09/21/18 1428  BP:  130/70  Pulse:  (!) 111  Resp: 16   SpO2:  97%  Weight: 150 lb (68 kg)   Height: 5\' 1"  (1.549 m)   RA  EXAM:  Gen: No overt distress at rest HEENT: NCAT, sclerae white Neck: No JVD noted, no LAN Chest/Lungs: Severe thoracic scoliosis, breath sounds mildly diminished throughout, no wheezes or other adventitious sounds Cardiovascular: RRR, soft Abdomen: Soft, NT, NABS Ext: No C/C/E Neuro: No focal deficits noted Skin: Limited exam, no lesions noted  DATA:   BMP Latest Ref Rng & Units 08/03/2018 07/25/2018 05/26/2018  Glucose 70 - 99 mg/dL 109(H) 107(H) 107(H)  BUN 8 - 23 mg/dL 33(H) 37(H) 33(H)  Creatinine 0.44 - 1.00 mg/dL 1.37(H) 1.33(H) 1.51(H)  BUN/Creat Ratio 12 - 28 - - -  Sodium 135 - 145 mmol/L 139 138 136  Potassium 3.5 - 5.1 mmol/L 5.1 4.6 4.9  Chloride 98 - 111 mmol/L 106 109 104  CO2 22 - 32 mmol/L 22 17(L) 20(L)  Calcium 8.9 - 10.3 mg/dL 9.6 9.2 9.6    CBC Latest Ref Rng & Units 08/31/2018 08/03/2018 07/25/2018  WBC 4.0 - 10.5 K/uL 7.9 8.4 10.9  Hemoglobin 12.0 - 15.0 g/dL 9.7(L) 8.8(L) 9.6(L)  Hematocrit 36.0 - 46.0 % 33.5(L) 30.5(L) 31.4(L)  Platelets 150 - 400 K/uL 203 215 190    CXR: No new film   IMPRESSION:     ICD-10-CM   1. Mod-severe DOE with little DTD variability R06.09   2. severe restrictive pulmonary physiology due to severe kyphoscoliosis R94.2   3. Chronic obstructive  pulmonary disease, unspecified COPD type ([place]) J44.9   4. Former smoker (1/2 PPD age 27 - 20). Doesn't meet crtieria for LDCT screening Z87.891   5. Pre-operative respiratory examination Z01.811   6. Thyromegaly E01.0 TSH   She has a moderate to severe ventilatory impairment due to severe scoliosis > COPD (which is  probably mild). In other words, restriction is more severe than obstruction. This is the principle cause of her DOE. There is likely a component of deconditioning as well.   Her smoking history is fairly modest (she started in her mid 59s and quit last year. Never smoked more than 1/2 PPD. Approx 15 P-Y history). This does not qualify her for lung cancer screening. Further, there would be very limited options of treatment if a lung cancer were to be found, thus reducing the potential benefit of screening.  I do not believe that further lung cancer screening is warranted!!.  Thyromegaly is an incidental finding on LDCT chest. I do not see any TFTs in Epic.  With regard to pre-operative pulmonary evaluation, she would be at high risk of peri-operative pulmonary complications due to her already markedly impaired pulmonary function. There are no measures that could be undertaken to significantly reduce this risk. Cholecystectomy, as an upper abdominal procedure, is a high risk procedure with regard to potential post-op respiratory embarrassment. Therefore, I have advised against elective cholecystectomy unless it is felt that the risk of severe gall bladder catastrophe in the future is very high.  PLAN:  Continue albuterol MDI as needed As above, I recommend against further lung cancer screening As above, I recommend against elective cholecystectomy TSH ordered today Follow up in 6 months. Call sooner if needed   Merton Border, MD PCCM service Mobile [phone] Pager [phone] 09/21/2018 8:35 PM

## 2018-09-22 ENCOUNTER — Encounter: Payer: Self-pay | Admitting: *Deleted

## 2018-09-22 NOTE — Progress Notes (Signed)
Per Dr. Alva Garnet, he does not recommend gallbladder surgery from a pulmonary standpoint. See 09-21-18 office note.   Note to Dr. Dahlia Byes regarding above.   Patient is scheduled for a colonoscopy with Dr. Allen Norris on 10-15-18.  Will check with Dr. Dahlia Byes to see if he still wants to follow up with patient after colonoscopy is completed.

## 2018-09-30 NOTE — Progress Notes (Signed)
Appointment with Dr. Dahlia Byes scheduled for 10-27-2018.

## 2018-09-30 NOTE — Progress Notes (Signed)
Yes please have pt see me

## 2018-10-05 ENCOUNTER — Telehealth: Payer: Self-pay

## 2018-10-05 ENCOUNTER — Other Ambulatory Visit: Payer: Self-pay

## 2018-10-05 ENCOUNTER — Encounter: Payer: Self-pay | Admitting: *Deleted

## 2018-10-05 NOTE — Telephone Encounter (Signed)
Pt would like to cancel procedure and reschedule. Pls call patients daughter who's listed on the DPR.

## 2018-10-06 ENCOUNTER — Encounter: Payer: Self-pay | Admitting: Anesthesiology

## 2018-10-06 NOTE — Telephone Encounter (Signed)
LVM with patient's daughter [name], to reschedule her mothers colonoscopy.  Her mothers colonoscopy has been canceled with Maudie Mercury at [facility].  Will await phone call to reschedule procedure.  Thanks [employer]

## 2018-10-06 NOTE — Anesthesia Preprocedure Evaluation (Deleted)
Anesthesia Evaluation    Airway        Dental   Pulmonary COPD, former smoker,           Cardiovascular hypertension,      Neuro/Psych    GI/Hepatic   Endo/Other    Renal/GU      Musculoskeletal   Abdominal   Peds  Hematology   Anesthesia Other Findings   Reproductive/Obstetrics                             Anesthesia Physical Anesthesia Plan Anesthesia Quick Evaluation

## 2018-10-06 NOTE — Anesthesia Preprocedure Evaluation (Deleted)
Patient hx reviewed for potential colonoscopy at [facility].  Pt is not a candidate for surgery at [facility] due to severe restrictive lung disease and moderate pulmonary artery hypertension

## 2018-10-11 ENCOUNTER — Telehealth: Payer: Self-pay | Admitting: Gastroenterology

## 2018-10-11 ENCOUNTER — Inpatient Hospital Stay: Payer: Medicare Other | Attending: Hematology and Oncology

## 2018-10-11 ENCOUNTER — Telehealth: Payer: Self-pay

## 2018-10-11 DIAGNOSIS — D631 Anemia in chronic kidney disease: Secondary | ICD-10-CM | POA: Insufficient documentation

## 2018-10-11 DIAGNOSIS — E538 Deficiency of other specified B group vitamins: Secondary | ICD-10-CM | POA: Diagnosis not present

## 2018-10-11 DIAGNOSIS — D509 Iron deficiency anemia, unspecified: Secondary | ICD-10-CM

## 2018-10-11 DIAGNOSIS — I129 Hypertensive chronic kidney disease with stage 1 through stage 4 chronic kidney disease, or unspecified chronic kidney disease: Secondary | ICD-10-CM | POA: Diagnosis not present

## 2018-10-11 DIAGNOSIS — N183 Chronic kidney disease, stage 3 (moderate): Secondary | ICD-10-CM | POA: Diagnosis not present

## 2018-10-11 LAB — CBC WITH DIFFERENTIAL/PLATELET
Abs Immature Granulocytes: 0.03 10*3/uL (ref 0.00–0.07)
Basophils Absolute: 0.1 10*3/uL (ref 0.0–0.1)
Basophils Relative: 1 %
Eosinophils Absolute: 0.2 10*3/uL (ref 0.0–0.5)
Eosinophils Relative: 3 %
HCT: 34.1 % — ABNORMAL LOW (ref 36.0–46.0)
Hemoglobin: 10.3 g/dL — ABNORMAL LOW (ref 12.0–15.0)
Immature Granulocytes: 0 %
Lymphocytes Relative: 16 %
Lymphs Abs: 1.5 10*3/uL (ref 0.7–4.0)
MCH: 25.6 pg — ABNORMAL LOW (ref 26.0–34.0)
MCHC: 30.2 g/dL (ref 30.0–36.0)
MCV: 84.6 fL (ref 80.0–100.0)
Monocytes Absolute: 1 10*3/uL (ref 0.1–1.0)
Monocytes Relative: 10 %
Neutro Abs: 6.6 10*3/uL (ref 1.7–7.7)
Neutrophils Relative %: 70 %
Platelets: 178 10*3/uL (ref 150–400)
RBC: 4.03 MIL/uL (ref 3.87–5.11)
RDW: 15.6 % — ABNORMAL HIGH (ref 11.5–15.5)
WBC: 9.4 10*3/uL (ref 4.0–10.5)
nRBC: 0 % (ref 0.0–0.2)

## 2018-10-11 LAB — FERRITIN: Ferritin: 33 ng/mL (ref 11–307)

## 2018-10-11 NOTE — Telephone Encounter (Signed)
Attempted to return daughters call to reschedule her mothers colonoscopy.  I was unable to get through.  Her colonoscopy had been previously canceled for 10/15/18.  Thanks [employer]

## 2018-10-11 NOTE — Telephone Encounter (Signed)
[address] called & wanted to schedule a colonoscopy for the patient.

## 2018-10-14 ENCOUNTER — Ambulatory Visit (INDEPENDENT_AMBULATORY_CARE_PROVIDER_SITE_OTHER): Payer: Medicare Other

## 2018-10-14 VITALS — BP 140/60 | HR 91 | Temp 98.2°F | Ht 61.0 in | Wt 150.0 lb

## 2018-10-14 DIAGNOSIS — Z1239 Encounter for other screening for malignant neoplasm of breast: Secondary | ICD-10-CM | POA: Diagnosis not present

## 2018-10-14 DIAGNOSIS — E2839 Other primary ovarian failure: Secondary | ICD-10-CM

## 2018-10-14 DIAGNOSIS — Z0000 Encounter for general adult medical examination without abnormal findings: Secondary | ICD-10-CM | POA: Diagnosis not present

## 2018-10-14 MED ORDER — ZOSTER VAC RECOMB ADJUVANTED 50 MCG/0.5ML IM SUSR: 0.5000 mL | Freq: Once | INTRAMUSCULAR | 1 refills | Status: AC

## 2018-10-14 NOTE — Progress Notes (Signed)
Subjective:   Madison Franklin is a 72 y.o. female who presents for Medicare Annual (Subsequent) preventive examination.       Objective:     Vitals: BP 140/60 (BP Location: Left Arm, Patient Position: Sitting)   Pulse 91   Temp 98.2 F (36.8 C) (Oral)   Ht 5\' 1"  (1.549 m)   Wt 150 lb (68 kg)   LMP  (LMP Unknown)   BMI 28.34 kg/m   Body mass index is 28.34 kg/m.  Advanced Directives 10/14/2018 09/01/2018 07/25/2018 06/30/2018 10/05/2017 09/30/2017 07/14/2017  Does Patient Have a Medical Advance Directive? No No No No No No Yes  Does patient want to make changes to medical advance directive? - - - - - - Yes (MAU/Ambulatory/Procedural Areas - Information given)  Would patient like information on creating a medical advance directive? No - Patient declined No - Patient declined No - Patient declined No - Patient declined Yes (MAU/Ambulatory/Procedural Areas - Information given) Yes (MAU/Ambulatory/Procedural Areas - Information given) -    Tobacco Social History   Tobacco Use  Smoking Status Former Smoker  . Packs/day: 0.50  . Years: 30.00  . Pack years: 15.00  . Types: Cigarettes  . Last attempt to quit: 01/17/2017  . Years since quitting: 1.7  Smokeless Tobacco Never Used     Counseling given: Not Answered   Clinical Intake:  Pre-visit preparation completed: No  Pain : No/denies pain     Nutritional Risks: None Diabetes: No  How often do you need to have someone help you when you read instructions, pamphlets, or other written materials from your doctor or pharmacy?: 1 - Never What is the last grade level you completed in school?: HS  Interpreter Needed?: No  Information entered by :: Tyson Dense, RN  Past Medical History:  Diagnosis Date  . COPD (chronic obstructive pulmonary disease) ([place])   . Dyspnea   . Hearing aid worn    bilateral  . Hyperlipidemia   . Hypertension   . Osteoporosis   . Personal history of tobacco use, presenting hazards to health  08/15/2015  . Pulmonary hypertension, mild ([facility])    mild to moderate   Past Surgical History:  Procedure Laterality Date  . ABDOMINAL HYSTERECTOMY    . COLONOSCOPY WITH PROPOFOL N/A 11/14/2016   Procedure: COLONOSCOPY WITH PROPOFOL;  Surgeon: [name], MD;  Location: [place];  Service: Endoscopy;  Laterality: N/A;  . long nodule    . POLYPECTOMY  11/14/2016   Procedure: POLYPECTOMY;  Surgeon: [name], MD;  Location: [place];  Service: Endoscopy;;  . RIGHT HEART CATH Right 07/14/2017   Procedure: RIGHT HEART CATH;  Surgeon: Nelva Bush, MD;  Location: [place] [facility];  Service: Cardiovascular;  Laterality: Right;   Family History  Problem Relation Age of Onset  . Heart disease Neg Hx    Social History   Socioeconomic History  . Marital status: Married    Spouse name: Not on file  . Number of children: Not on file  . Years of education: 26  . Highest education level: 12th grade  Occupational History  . Not on file  Social Needs  . Financial resource strain: Hard  . Food insecurity:    Worry: Never true    Inability: Never true  . Transportation needs:    Medical: No    Non-medical: No  Tobacco Use  . Smoking status: Former Smoker    Packs/day: 0.50    Years: 30.00  Pack years: 15.00    Types: Cigarettes    Last attempt to quit: 01/17/2017    Years since quitting: 1.7  . Smokeless tobacco: Never Used  Substance and Sexual Activity  . Alcohol use: No    Alcohol/week: 0.0 standard drinks  . Drug use: No  . Sexual activity: Yes  Lifestyle  . Physical activity:    Days per week: 0 days    Minutes per session: 0 min  . Stress: Not at all  Relationships  . Social connections:    Talks on phone: Never    Gets together: Once a week    Attends religious service: More than 4 times per year    Active member of club or organization: No    Attends meetings of clubs or organizations: Never    Relationship status: Married  Other  Topics Concern  . Not on file  Social History Narrative  . Not on file    Outpatient Encounter Medications as of 10/14/2018  Medication Sig  . acetaminophen (TYLENOL) 325 MG tablet Take 325 mg by mouth every 6 (six) hours as needed (for pain.).  Marland Kitchen albuterol (PROVENTIL HFA;VENTOLIN HFA) 108 (90 Base) MCG/ACT inhaler Inhale 2 puffs into the lungs every 6 (six) hours as needed for wheezing or shortness of breath.  Marland Kitchen amLODipine (NORVASC) 5 MG tablet Take 1 tablet (5 mg total) by mouth daily.  Marland Kitchen aspirin EC 81 MG tablet Take 81 mg by mouth daily.  Marland Kitchen atorvastatin (LIPITOR) 10 MG tablet Take 1 tablet (10 mg total) by mouth daily.  . ferrous sulfate 325 (65 FE) MG tablet Take 65 mg by mouth daily with breakfast.  . hydrochlorothiazide (MICROZIDE) 12.5 MG capsule Take 1 capsule (12.5 mg total) by mouth daily.  Marland Kitchen lisinopril (PRINIVIL,ZESTRIL) 40 MG tablet Take 1 tablet (40 mg total) by mouth daily.  . vitamin B-12 (CYANOCOBALAMIN) 1000 MCG tablet Take 1,000 mcg by mouth daily.  . Vitamin D, Cholecalciferol, 1000 units TABS Take 1 tablet by mouth daily.  Marland Kitchen Zoster Vaccine Adjuvanted [facility]) injection Inject 0.5 mLs into the muscle once for 1 dose.  . [DISCONTINUED] Zoster Vaccine Adjuvanted [facility]) injection Inject 0.5 mLs into the muscle once.  . dicyclomine (BENTYL) 20 MG tablet Take 1 tablet (20 mg total) by mouth 3 (three) times daily as needed (abdominal pain). (Patient not taking: Reported on 10/05/2018)  . fluticasone furoate-vilanterol (BREO ELLIPTA) 100-25 MCG/INH AEPB Inhale 1 puff into the lungs daily. (Patient not taking: Reported on 09/21/2018)  . ondansetron (ZOFRAN) 4 MG tablet Take 1 tablet (4 mg total) by mouth every 8 (eight) hours as needed for nausea or vomiting. (Patient not taking: Reported on 10/05/2018)  . tiotropium (SPIRIVA HANDIHALER) 18 MCG inhalation capsule Place 1 capsule (18 mcg total) into inhaler and inhale daily. (Patient not taking: Reported on 09/21/2018)   No  facility-administered encounter medications on file as of 10/14/2018.     Activities of Daily Living In your present state of health, do you have any difficulty performing the following activities: 10/14/2018  Hearing? Y  Vision? N  Difficulty concentrating or making decisions? N  Walking or climbing stairs? N  Dressing or bathing? N  Doing errands, shopping? N  Preparing Food and eating ? N  Using the Toilet? N  In the past six months, have you accidently leaked urine? N  Do you have problems with loss of bowel control? N  Managing your Medications? N  Managing your Finances? N  Housekeeping or managing your  Housekeeping? N  Some recent data might be hidden    Patient Care Team: Venita Lick, NP as PCP - General (Nurse Practitioner) [name], MD as Consulting Physician (Gastroenterology) Anthonette Legato, MD (Internal Medicine)    Assessment:   This is a routine wellness examination for [place].  Exercise Activities and Dietary recommendations Current Exercise Habits: The patient does not participate in regular exercise at present, Exercise limited by: None identified  Goals    . DIET - INCREASE WATER INTAKE     Recommend drinking at least 6-8 glasses of water a day       Fall Risk Fall Risk  10/14/2018 09/13/2018 10/05/2017 09/30/2017 09/29/2016  Falls in the past year? 0 0 No No No   Is the patient's home free of loose throw rugs in walkways, pet beds, electrical cords, etc?   yes      Grab bars in the bathroom? yes      Handrails on the stairs?   yes      Adequate lighting?   yes  Depression Screen PHQ 2/9 Scores 10/14/2018 10/05/2017 09/30/2017 09/29/2016  PHQ - 2 Score 0 0 0 0     Cognitive Function     6CIT Screen 10/14/2018 09/30/2017  What Year? 0 points 0 points  What month? 0 points 0 points  What time? 0 points 0 points  Count back from 20 0 points 0 points  Months in reverse 2 points 0 points  Repeat phrase 10 points 4 points  Total  Score 12 4    Immunization History  Administered Date(s) Administered  . Influenza, High Dose Seasonal PF 09/03/2016  . Influenza,inj,Quad PF,6+ Mos 07/27/2015  . Influenza-Unspecified 08/14/2017, 08/24/2018  . Pneumococcal Conjugate-13 01/12/2015  . Pneumococcal Polysaccharide-23 07/01/2011  . Td 07/27/2015    Qualifies for Shingles Vaccine? Yes, educated and ordered to pharmacy  Screening Tests Health Maintenance  Topic Date Due  . MAMMOGRAM  11/22/1994  . COLONOSCOPY  11/14/2017  . TETANUS/TDAP  07/26/2025  . INFLUENZA VACCINE  Completed  . DEXA SCAN  Completed  . Hepatitis C Screening  Completed  . PNA vac Low Risk Adult  Completed    Cancer Screenings: Lung: Low Dose CT Chest recommended if Age 91-80 years, 30 pack-year currently smoking OR have quit w/in 15years. Patient does not qualify. Breast:  Up to date on Mammogram? No, ordered   Up to date of Bone Density/Dexa? Due, ordered Colorectal: due, patient stated she had it scheduled for today but is rescheduling it   Additional Screenings:  Hepatitis C Screening: declined     Plan:    I have personally reviewed and addressed the Medicare Annual Wellness questionnaire and have noted the following in the patient's chart:  A. Medical and social history B. Use of alcohol, tobacco or illicit drugs  C. Current medications and supplements D. Functional ability and status E.  Nutritional status F.  Physical activity G. Advance directives H. List of other physicians I.  Hospitalizations, surgeries, and ER visits in previous 12 months J.  [place] to include hearing, vision, cognitive, depression L. Referrals and appointments - none  In addition, I have reviewed and discussed with patient certain preventive protocols, quality metrics, and best practice recommendations. A written personalized care plan for preventive services as well as general preventive health recommendations were provided to  patient.  See attached scanned questionnaire for additional information.   Signed,   Tyson Dense, RN Nurse Health Advisor  Patient Concerns:  Requested up dated handicap [id number] notified

## 2018-10-14 NOTE — Patient Instructions (Addendum)
Madison Franklin , Thank you for taking time to come for your Medicare Wellness Visit. I appreciate your ongoing commitment to your health goals. Please review the following plan we discussed and let me know if I can assist you in the future.   Screening recommendations/referrals: Colonoscopy due, please reschedule Mammogram due, ordered to [facility]. Please call [phone] to schedule Bone Density ordered to [place]. Please call [phone] to schedule Recommended yearly ophthalmology/optometry visit for glaucoma screening and checkup Recommended yearly dental visit for hygiene and checkup  Vaccinations: Influenza vaccine up to date Pneumococcal vaccine up to date, completed Tdap vaccine up to date, due 07/26/2025 Shingles vaccine due, ordered to pharmacy    Advanced directives: Please bring [place] a copy once living will and health care power of attorney is completed and notarized  Conditions/risks identified: none  Next appointment: Medicare Wellness Visit 10/17/2019 @ 8:45am   Preventive Care 73 Years and Older, Female Preventive care refers to lifestyle choices and visits with your health care provider that can promote health and wellness. What does preventive care include?  A yearly physical exam. This is also called an annual well check.  Dental exams once or twice a year.  Routine eye exams. Ask your health care provider how often you should have your eyes checked.  Personal lifestyle choices, including:  Daily care of your teeth and gums.  Regular physical activity.  Eating a healthy diet.  Avoiding tobacco and drug use.  Limiting alcohol use.  Practicing safe sex.  Taking low-dose aspirin every day.  Taking vitamin and mineral supplements as recommended by your health care provider. What happens during an annual well check? The services and screenings done by your health care provider during your annual well check will depend on your age, overall health,  lifestyle risk factors, and family history of disease. Counseling  Your health care provider may ask you questions about your:  Alcohol use.  Tobacco use.  Drug use.  Emotional well-being.  Home and relationship well-being.  Sexual activity.  Eating habits.  History of falls.  Memory and ability to understand (cognition).  Work and work Statistician.  Reproductive health. Screening  You may have the following tests or measurements:  Height, weight, and BMI.  Blood pressure.  Lipid and cholesterol levels. These may be checked every 5 years, or more frequently if you are over 79 years old.  Skin check.  Lung cancer screening. You may have this screening every year starting at age 73 if you have a 30-pack-year history of smoking and currently smoke or have quit within the past 15 years.  Fecal occult blood test (FOBT) of the stool. You may have this test every year starting at age 73.  Flexible sigmoidoscopy or colonoscopy. You may have a sigmoidoscopy every 5 years or a colonoscopy every 10 years starting at age 73.  Hepatitis C blood test.  Hepatitis B blood test.  Sexually transmitted disease (STD) testing.  Diabetes screening. This is done by checking your blood sugar (glucose) after you have not eaten for a while (fasting). You may have this done every 1-3 years.  Bone density scan. This is done to screen for osteoporosis. You may have this done starting at age 73.  Mammogram. This may be done every 1-2 years. Talk to your health care provider about how often you should have regular mammograms. Talk with your health care provider about your test results, treatment options, and if necessary, the need for more tests. Vaccines  Your health care provider may recommend certain vaccines, such as:  Influenza vaccine. This is recommended every year.  Tetanus, diphtheria, and acellular pertussis (Tdap, Td) vaccine. You may need a Td booster every 10 years.  Zoster  vaccine. You may need this after age 73.  Pneumococcal 13-valent conjugate (PCV13) vaccine. One dose is recommended after age 21.  Pneumococcal polysaccharide (PPSV23) vaccine. One dose is recommended after age 73. Talk to your health care provider about which screenings and vaccines you need and how often you need them. This information is not intended to replace advice given to you by your health care provider. Make sure you discuss any questions you have with your health care provider. Document Released: 11/02/2015 Document Revised: 06/25/2016 Document Reviewed: 08/07/2015 Elsevier Interactive Patient Education  2017 [employer] Prevention in the Home Falls can cause injuries. They can happen to people of all ages. There are many things you can do to make your home safe and to help prevent falls. What can I do on the outside of my home?  Regularly fix the edges of walkways and driveways and fix any cracks.  Remove anything that might make you trip as you walk through a door, such as a raised step or threshold.  Trim any bushes or trees on the path to your home.  Use bright outdoor lighting.  Clear any walking paths of anything that might make someone trip, such as rocks or tools.  Regularly check to see if handrails are loose or broken. Make sure that both sides of any steps have handrails.  Any raised decks and porches should have guardrails on the edges.  Have any leaves, snow, or ice cleared regularly.  Use sand or salt on walking paths during winter.  Clean up any spills in your garage right away. This includes oil or grease spills. What can I do in the bathroom?  Use night lights.  Install grab bars by the toilet and in the tub and shower. Do not use towel bars as grab bars.  Use non-skid mats or decals in the tub or shower.  If you need to sit down in the shower, use a plastic, non-slip stool.  Keep the floor dry. Clean up any water that spills on the  floor as soon as it happens.  Remove soap buildup in the tub or shower regularly.  Attach bath mats securely with double-sided non-slip rug tape.  Do not have throw rugs and other things on the floor that can make you trip. What can I do in the bedroom?  Use night lights.  Make sure that you have a light by your bed that is easy to reach.  Do not use any sheets or blankets that are too big for your bed. They should not hang down onto the floor.  Have a firm chair that has side arms. You can use this for support while you get dressed.  Do not have throw rugs and other things on the floor that can make you trip. What can I do in the kitchen?  Clean up any spills right away.  Avoid walking on wet floors.  Keep items that you use a lot in easy-to-reach places.  If you need to reach something above you, use a strong step stool that has a grab bar.  Keep electrical cords out of the way.  Do not use floor polish or wax that makes floors slippery. If you must use wax, use non-skid floor wax.  Do  not have throw rugs and other things on the floor that can make you trip. What can I do with my stairs?  Do not leave any items on the stairs.  Make sure that there are handrails on both sides of the stairs and use them. Fix handrails that are broken or loose. Make sure that handrails are as long as the stairways.  Check any carpeting to make sure that it is firmly attached to the stairs. Fix any carpet that is loose or worn.  Avoid having throw rugs at the top or bottom of the stairs. If you do have throw rugs, attach them to the floor with carpet tape.  Make sure that you have a light switch at the top of the stairs and the bottom of the stairs. If you do not have them, ask someone to add them for you. What else can I do to help prevent falls?  Wear shoes that:  Do not have high heels.  Have rubber bottoms.  Are comfortable and fit you well.  Are closed at the toe. Do not wear  sandals.  If you use a stepladder:  Make sure that it is fully opened. Do not climb a closed stepladder.  Make sure that both sides of the stepladder are locked into place.  Ask someone to hold it for you, if possible.  Clearly mark and make sure that you can see:  Any grab bars or handrails.  First and last steps.  Where the edge of each step is.  Use tools that help you move around (mobility aids) if they are needed. These include:  Canes.  Walkers.  Scooters.  Crutches.  Turn on the lights when you go into a dark area. Replace any light bulbs as soon as they burn out.  Set up your furniture so you have a clear path. Avoid moving your furniture around.  If any of your floors are uneven, fix them.  If there are any pets around you, be aware of where they are.  Review your medicines with your doctor. Some medicines can make you feel dizzy. This can increase your chance of falling. Ask your doctor what other things that you can do to help prevent falls. This information is not intended to replace advice given to you by your health care provider. Make sure you discuss any questions you have with your health care provider. Document Released: 08/02/2009 Document Revised: 03/13/2016 Document Reviewed: 11/10/2014 Elsevier Interactive Patient Education  2017 [employer].

## 2018-10-15 ENCOUNTER — Ambulatory Visit: Admit: 2018-10-15 | Payer: Medicare Other | Admitting: Gastroenterology

## 2018-10-15 HISTORY — DX: Pulmonary hypertension, unspecified: I27.20

## 2018-10-15 HISTORY — DX: Dyspnea, unspecified: R06.00

## 2018-10-15 SURGERY — COLONOSCOPY WITH PROPOFOL
Anesthesia: General

## 2018-10-19 ENCOUNTER — Telehealth: Payer: Self-pay

## 2018-10-19 NOTE — Telephone Encounter (Signed)
Called patient twice to notify her her form for the disability placard for parking is complete and ready for pick-up. She just has to fill out the rest and take it to the [facility].   CRM Created.

## 2018-10-21 ENCOUNTER — Telehealth: Payer: Self-pay | Admitting: Gastroenterology

## 2018-10-21 NOTE — Telephone Encounter (Signed)
Patient's daughter called Cathlean Sauer) to r/s patients colonoscopy that was originally scheduled on 10-15-2018.

## 2018-10-22 ENCOUNTER — Other Ambulatory Visit: Payer: Self-pay

## 2018-10-22 DIAGNOSIS — Z86010 Personal history of colonic polyps: Secondary | ICD-10-CM

## 2018-10-22 NOTE — Telephone Encounter (Signed)
LVM returning Lori's call to schedule her mother for colonoscopy.  Lori's Number:  [phone]  Thanks Sharyn Lull

## 2018-10-22 NOTE — Telephone Encounter (Signed)
Carolyne Fiscal called to schedule colonoscopy for patient .

## 2018-10-22 NOTE — Telephone Encounter (Signed)
Returned patients daughter Angela's call to schedule her mother for her colonoscopy.  Pt has been scheduled colonoscopy for 11/10/18 at [facility] with Dr. Marius Ditch.  Thanks [employer]

## 2018-10-27 ENCOUNTER — Ambulatory Visit: Payer: Self-pay | Admitting: Surgery

## 2018-11-03 ENCOUNTER — Other Ambulatory Visit: Payer: Self-pay

## 2018-11-03 ENCOUNTER — Encounter: Payer: Self-pay | Admitting: *Deleted

## 2018-11-03 NOTE — Anesthesia Preprocedure Evaluation (Addendum)
Anesthesia Evaluation  Patient identified by MRN, date of birth, ID band Patient awake    Reviewed: Allergy & Precautions, NPO status , Patient's Chart, lab work & pertinent test results  History of Anesthesia Complications Negative for: history of anesthetic complications  Airway Mallampati: IV       Dental  (+) Edentulous Upper, Poor Dentition, Chipped   Pulmonary sleep apnea , COPD, former smoker (quit 12/2016),    + rhonchi        Cardiovascular hypertension, + Valvular Problems/Murmurs (pulmonary HTN)  Rhythm:Regular Rate:Normal + Systolic murmurs    Neuro/Psych HOH    GI/Hepatic negative GI ROS,   Endo/Other  negative endocrine ROS  Renal/GU      Musculoskeletal   Abdominal   Peds  Hematology   Anesthesia Other Findings   Reproductive/Obstetrics                            Anesthesia Physical Anesthesia Plan  ASA: III  Anesthesia Plan: General   Post-op Pain Management:    Induction: Intravenous  PONV Risk Score and Plan: 2 and TIVA and Propofol infusion  Airway Management Planned: Natural Airway  Additional Equipment:   Intra-op Plan:   Post-operative Plan:   Informed Consent: I have reviewed the patients History and Physical, chart, labs and discussed the procedure including the risks, benefits and alternatives for the proposed anesthesia with the patient or authorized representative who has indicated his/her understanding and acceptance.       Plan Discussed with: CRNA  Anesthesia Plan Comments:       Anesthesia Quick Evaluation

## 2018-11-09 NOTE — Discharge Instructions (Signed)
General Anesthesia, Adult, Care After  This sheet gives you information about how to care for yourself after your procedure. Your health care provider may also give you more specific instructions. If you have problems or questions, contact your health care provider.  What can I expect after the procedure?  After the procedure, the following side effects are common:  Pain or discomfort at the IV site.  Nausea.  Vomiting.  Sore throat.  Trouble concentrating.  Feeling cold or chills.  Weak or tired.  Sleepiness and fatigue.  Soreness and body aches. These side effects can affect parts of the body that were not involved in surgery.  Follow these instructions at home:    For at least 24 hours after the procedure:  Have a responsible adult stay with you. It is important to have someone help care for you until you are awake and alert.  Rest as needed.  Do not:  Participate in activities in which you could fall or become injured.  Drive.  Use heavy machinery.  Drink alcohol.  Take sleeping pills or medicines that cause drowsiness.  Make important decisions or sign legal documents.  Take care of children on your own.  Eating and drinking  Follow any instructions from your health care provider about eating or drinking restrictions.  When you feel hungry, start by eating small amounts of foods that are soft and easy to digest (bland), such as toast. Gradually return to your regular diet.  Drink enough fluid to keep your urine pale yellow.  If you vomit, rehydrate by drinking water, juice, or clear broth.  General instructions  If you have sleep apnea, surgery and certain medicines can increase your risk for breathing problems. Follow instructions from your health care provider about wearing your sleep device:  Anytime you are sleeping, including during daytime naps.  While taking prescription pain medicines, sleeping medicines, or medicines that make you drowsy.  Return to your normal activities as told by your health care  provider. Ask your health care provider what activities are safe for you.  Take over-the-counter and prescription medicines only as told by your health care provider.  If you smoke, do not smoke without supervision.  Keep all follow-up visits as told by your health care provider. This is important.  Contact a health care provider if:  You have nausea or vomiting that does not get better with medicine.  You cannot eat or drink without vomiting.  You have pain that does not get better with medicine.  You are unable to pass urine.  You develop a skin rash.  You have a fever.  You have redness around your IV site that gets worse.  Get help right away if:  You have difficulty breathing.  You have chest pain.  You have blood in your urine or stool, or you vomit blood.  Summary  After the procedure, it is common to have a sore throat or nausea. It is also common to feel tired.  Have a responsible adult stay with you for the first 24 hours after general anesthesia. It is important to have someone help care for you until you are awake and alert.  When you feel hungry, start by eating small amounts of foods that are soft and easy to digest (bland), such as toast. Gradually return to your regular diet.  Drink enough fluid to keep your urine pale yellow.  Return to your normal activities as told by your health care provider. Ask your health care   provider what activities are safe for you.  This information is not intended to replace advice given to you by your health care provider. Make sure you discuss any questions you have with your health care provider.  Document Released: 01/12/2001 Document Revised: 05/22/2017 Document Reviewed: 05/22/2017  Elsevier Interactive Patient Education  2019 [employer] Inc[employer]

## 2018-11-10 ENCOUNTER — Ambulatory Visit: Payer: Medicare Other | Admitting: Anesthesiology

## 2018-11-10 ENCOUNTER — Encounter: Payer: Self-pay | Admitting: Certified Registered"

## 2018-11-10 ENCOUNTER — Ambulatory Visit
Admission: RE | Admit: 2018-11-10 | Discharge: 2018-11-10 | Disposition: A | Discharge status: Home / Self Care | Payer: Medicare Other | Source: Ambulatory Visit | Attending: Gastroenterology | Admitting: Gastroenterology

## 2018-11-10 ENCOUNTER — Encounter
Admission: RE | Disposition: A | Discharge status: Home / Self Care | Payer: Self-pay | Source: Ambulatory Visit | Attending: Gastroenterology

## 2018-11-10 DIAGNOSIS — Z79899 Other long term (current) drug therapy: Secondary | ICD-10-CM | POA: Insufficient documentation

## 2018-11-10 DIAGNOSIS — K648 Other hemorrhoids: Secondary | ICD-10-CM | POA: Insufficient documentation

## 2018-11-10 DIAGNOSIS — K635 Polyp of colon: Secondary | ICD-10-CM | POA: Diagnosis not present

## 2018-11-10 DIAGNOSIS — K644 Residual hemorrhoidal skin tags: Secondary | ICD-10-CM | POA: Diagnosis not present

## 2018-11-10 DIAGNOSIS — D123 Benign neoplasm of transverse colon: Secondary | ICD-10-CM

## 2018-11-10 DIAGNOSIS — K2940 Chronic atrophic gastritis without bleeding: Secondary | ICD-10-CM | POA: Insufficient documentation

## 2018-11-10 DIAGNOSIS — K3189 Other diseases of stomach and duodenum: Secondary | ICD-10-CM | POA: Diagnosis not present

## 2018-11-10 DIAGNOSIS — D124 Benign neoplasm of descending colon: Secondary | ICD-10-CM | POA: Insufficient documentation

## 2018-11-10 DIAGNOSIS — D127 Benign neoplasm of rectosigmoid junction: Secondary | ICD-10-CM | POA: Diagnosis not present

## 2018-11-10 DIAGNOSIS — Z87891 Personal history of nicotine dependence: Secondary | ICD-10-CM | POA: Insufficient documentation

## 2018-11-10 DIAGNOSIS — Z86010 Personal history of colonic polyps: Secondary | ICD-10-CM

## 2018-11-10 DIAGNOSIS — D125 Benign neoplasm of sigmoid colon: Secondary | ICD-10-CM | POA: Diagnosis not present

## 2018-11-10 DIAGNOSIS — Z7982 Long term (current) use of aspirin: Secondary | ICD-10-CM | POA: Insufficient documentation

## 2018-11-10 DIAGNOSIS — I10 Essential (primary) hypertension: Secondary | ICD-10-CM | POA: Diagnosis not present

## 2018-11-10 DIAGNOSIS — K2210 Ulcer of esophagus without bleeding: Secondary | ICD-10-CM | POA: Diagnosis not present

## 2018-11-10 DIAGNOSIS — J449 Chronic obstructive pulmonary disease, unspecified: Secondary | ICD-10-CM | POA: Diagnosis not present

## 2018-11-10 DIAGNOSIS — D122 Benign neoplasm of ascending colon: Secondary | ICD-10-CM

## 2018-11-10 DIAGNOSIS — D509 Iron deficiency anemia, unspecified: Secondary | ICD-10-CM | POA: Diagnosis not present

## 2018-11-10 DIAGNOSIS — G4730 Sleep apnea, unspecified: Secondary | ICD-10-CM | POA: Insufficient documentation

## 2018-11-10 HISTORY — PX: COLONOSCOPY WITH PROPOFOL: SHX5780

## 2018-11-10 HISTORY — PX: POLYPECTOMY: SHX5525

## 2018-11-10 HISTORY — PX: ESOPHAGOGASTRODUODENOSCOPY (EGD) WITH PROPOFOL: SHX5813

## 2018-11-10 SURGERY — COLONOSCOPY WITH PROPOFOL
Anesthesia: General | Site: Throat

## 2018-11-10 MED ORDER — STERILE WATER FOR IRRIGATION IR SOLN
Status: DC | PRN
  Administered 2018-11-10: 08:00:00

## 2018-11-10 MED ORDER — LIDOCAINE HCL (CARDIAC) PF 100 MG/5ML IV SOSY
PREFILLED_SYRINGE | INTRAVENOUS | Status: DC | PRN
  Administered 2018-11-10: 30 mg via INTRAVENOUS

## 2018-11-10 MED ORDER — GLYCOPYRROLATE 0.2 MG/ML IJ SOLN
INTRAMUSCULAR | Status: DC | PRN
  Administered 2018-11-10: 0.2 mg via INTRAVENOUS

## 2018-11-10 MED ORDER — SODIUM CHLORIDE 0.9 % IV SOLN: INTRAVENOUS | Status: DC

## 2018-11-10 MED ORDER — LACTATED RINGERS IV SOLN
INTRAVENOUS | Status: DC
  Administered 2018-11-10: 07:00:00 via INTRAVENOUS

## 2018-11-10 MED ORDER — PROPOFOL 10 MG/ML IV BOLUS
INTRAVENOUS | Status: DC | PRN
  Administered 2018-11-10 (×11): 20 mg via INTRAVENOUS
  Administered 2018-11-10: 50 mg via INTRAVENOUS

## 2018-11-10 SURGICAL SUPPLY — 13 items
BLOCK BITE 60FR ADLT L/F GRN (MISCELLANEOUS) ×5 IMPLANT
CANISTER SUCT 1200ML W/VALVE (MISCELLANEOUS) ×5 IMPLANT
CLIP HMST11XOPN 235X2.8X (MISCELLANEOUS) ×3 IMPLANT
CLIP RESOLUTION 360 11X235 (MISCELLANEOUS) ×2
ELECT REM PT RETURN 9FT ADLT (ELECTROSURGICAL) ×5
ELECTRODE REM PT RTRN 9FT ADLT (ELECTROSURGICAL) ×3 IMPLANT
FORCEPS BIOP RAD 4 LRG CAP 4 (CUTTING FORCEPS) ×5 IMPLANT
GOWN CVR UNV OPN BCK APRN NK (MISCELLANEOUS) ×6 IMPLANT
GOWN ISOL THUMB LOOP REG UNIV (MISCELLANEOUS) ×4
KIT ENDO PROCEDURE OLY (KITS) ×10 IMPLANT
SNARE COLD EXACTO (MISCELLANEOUS) ×5 IMPLANT
TRAP ETRAP POLY (MISCELLANEOUS) ×5 IMPLANT
WATER STERILE IRR 250ML POUR (IV SOLUTION) ×5 IMPLANT

## 2018-11-10 NOTE — Anesthesia Postprocedure Evaluation (Signed)
Anesthesia Post Note  Patient: Madison Franklin  Procedure(s) Performed: COLONOSCOPY WITH PROPOFOL (N/A ) ESOPHAGOGASTRODUODENOSCOPY (EGD) WITH PROPOFOL (Throat) POLYPECTOMY (Rectum)  Patient location during evaluation: PACU Anesthesia Type: General Level of consciousness: awake and alert, oriented and patient cooperative Pain management: pain level controlled Vital Signs Assessment: post-procedure vital signs reviewed and stable Respiratory status: spontaneous breathing, nonlabored ventilation and respiratory function stable Cardiovascular status: blood pressure returned to baseline and stable Postop Assessment: adequate PO intake Anesthetic complications: no    Darrin Nipper

## 2018-11-10 NOTE — Op Note (Signed)
[facility]astroenter[facility] Patient Name: Madison Franklin Procedure Date: 11/10/2018 7:54 AM MRN: [id number] Account #: [id number] Date of Birth: 07-Apr-1946 Admit Type: Outpatient Age: 73 Room: [facility] OR ROOM 01 Gender: Female Note Status: Finalized Procedure:            Colonoscopy Indications:          Surveillance: History of numerous (> 10) adenomas on                        last colonoscopy (< 3 yrs), Last colonoscopy: January                        2018 Providers:            Lin Landsman MD, MD Medicines:            Monitored Anesthesia Care Complications:        No immediate complications. Estimated blood loss:                        Minimal. Procedure:            Pre-Anesthesia Assessment:                       - Prior to the procedure, a History and Physical was                        performed, and patient medications and allergies were                        reviewed. The patient is competent. The risks and                        benefits of the procedure and the sedation options and                        risks were discussed with the patient. All questions                        were answered and informed consent was obtained.                        Patient identification and proposed procedure were                        verified by the physician, the nurse, the                        anesthesiologist, the anesthetist and the technician in                        the pre-procedure area in the procedure room in the                        endoscopy suite. Mental Status Examination: alert and                        oriented. Airway Examination: normal oropharyngeal                        airway and neck mobility.  Respiratory Examination:                        clear to auscultation. CV Examination: normal.                        Prophylactic Antibiotics: The patient does not require                        prophylactic antibiotics. Prior Anticoagulants:  The                        patient has taken no previous anticoagulant or                        antiplatelet agents. ASA Grade Assessment: III - A                        patient with severe systemic disease. After reviewing                        the risks and benefits, the patient was deemed in                        satisfactory condition to undergo the procedure. The                        anesthesia plan was to use monitored anesthesia care                        (MAC). Immediately prior to administration of                        medications, the patient was re-assessed for adequacy                        to receive sedatives. The heart rate, respiratory rate,                        oxygen saturations, blood pressure, adequacy of                        pulmonary ventilation, and response to care were                        monitored throughout the procedure. The physical status                        of the patient was re-assessed after the procedure.                       After obtaining informed consent, the colonoscope was                        passed under direct vision. Throughout the procedure,                        the patient's blood pressure, pulse, and oxygen                        saturations were monitored continuously.  The                        Colonoscope was introduced through the anus and                        advanced to the the cecum, identified by appendiceal                        orifice and ileocecal valve. The colonoscopy was                        technically difficult and complex due to significant                        looping and the patient's body habitus. Successful                        completion of the procedure was aided by applying                        abdominal pressure. The patient tolerated the procedure                        well. The quality of the bowel preparation was                        evaluated using the BBPS [facility] Bowel  Preparation                        Scale) with scores of: Right Colon = 3, Transverse                        Colon = 3 and Left Colon = 3 (entire mucosa seen well                        with no residual staining, small fragments of stool or                        opaque liquid). The total BBPS score equals 9. Findings:      The perianal and digital rectal examinations were normal. Pertinent       negatives include normal sphincter tone and no palpable rectal lesions.      Three sessile polyps were found in the ascending colon. The polyps were       5 to 6 mm in size. These polyps were removed with a cold snare.       Resection and retrieval were complete. To prevent bleeding after the       polypectomy, one hemostatic clip was successfully placed (MR       conditional). There was no bleeding at the end of the procedure.      A 8 mm polyp was found in the transverse colon. The polyp was sessile.       The polyp was removed with a hot snare. Resection and retrieval were       complete.      A 5 mm polyp was found in the transverse colon. The polyp was sessile.       The polyp was removed with a cold snare. Resection and retrieval  were       complete.      A 5 mm polyp was found in the sigmoid colon. The polyp was sessile. The       polyp was removed with a cold snare. Resection and retrieval were       complete.      A 7 mm polyp was found in the recto-sigmoid colon. The polyp was       sessile. The polyp was removed with a hot snare. Resection and retrieval       were complete.      Non-bleeding external and internal hemorrhoids were found during       retroflexion. The hemorrhoids were large. Impression:           - Three 5 to 6 mm polyps in the ascending colon,                        removed with a cold snare. Resected and retrieved. Clip                        (MR conditional) was placed.                       - One 8 mm polyp in the transverse colon, removed with                         a hot snare. Resected and retrieved.                       - One 5 mm polyp in the transverse colon, removed with                        a cold snare. Resected and retrieved.                       - One 5 mm polyp in the sigmoid colon, removed with a                        cold snare. Resected and retrieved.                       - One 7 mm polyp at the recto-sigmoid colon, removed                        with a hot snare. Resected and retrieved.                       - Non-bleeding external and internal hemorrhoids. Recommendation:       - Discharge patient to home (with escort).                       - Resume previous diet today.                       - Continue present medications.                       - Await pathology results.                       - Repeat colonoscopy in 3 years for  surveillance of                        multiple polyps. Procedure Code(s):    --- Professional ---                       [phone], Colonoscopy, flexible; with removal of tumor(s),                        polyp(s), or other lesion(s) by snare technique Diagnosis Code(s):    --- Professional ---                       Z86.010, Personal history of colonic polyps                       D12.2, Benign neoplasm of ascending colon                       D12.3, Benign neoplasm of transverse colon (hepatic                        flexure or splenic flexure)                       D12.5, Benign neoplasm of sigmoid colon                       D12.7, Benign neoplasm of rectosigmoid junction                       K64.8, Other hemorrhoids CPT copyright 2018 American [employer] Association. All rights reserved. The codes documented in this report are preliminary and upon coder review may  be revised to meet current compliance requirements. Dr. Ulyess Mort Lin Landsman MD, MD 11/10/2018 9:21:41 AM This report has been signed electronically. Number of Addenda: 0 Note Initiated On: 11/10/2018 7:54 AM Scope Withdrawal  Time: 0 hours 32 minutes 24 seconds  Total Procedure Duration: 0 hours 39 minutes 54 seconds       [facility]

## 2018-11-10 NOTE — Transfer of Care (Signed)
Immediate Anesthesia Transfer of Care Note  Patient: Madison Franklin  Procedure(s) Performed: COLONOSCOPY WITH PROPOFOL (N/A ) ESOPHAGOGASTRODUODENOSCOPY (EGD) WITH PROPOFOL (Throat) POLYPECTOMY (Rectum)  Patient Location: PACU  Anesthesia Type: General  Level of Consciousness: awake, alert  and patient cooperative  Airway and Oxygen Therapy: Patient Spontanous Breathing and Patient connected to supplemental oxygen  Post-op Assessment: Post-op Vital signs reviewed, Patient's Cardiovascular Status Stable, Respiratory Function Stable, Patent Airway and No signs of Nausea or vomiting  Post-op Vital Signs: Reviewed and stable  Complications: No apparent anesthesia complications

## 2018-11-10 NOTE — H&P (Signed)
Cephas Darby, MD [address]  [place]  [place], [place] [address]  Main: [phone]  Fax: [phone] Pager: [phone]  Primary Care Physician:  Venita Lick, NP Primary Gastroenterologist:  Dr. Cephas Darby  Pre-Procedure History & Physical: HPI:  Madison Franklin is a 73 y.o. female is here for an endoscopy and colonoscopy.   Past Medical History:  Diagnosis Date  . COPD (chronic obstructive pulmonary disease) ([place])   . Dyspnea   . Hearing aid worn    bilateral  . Hyperlipidemia   . Hypertension   . Osteoporosis   . Personal history of tobacco use, presenting hazards to health 08/15/2015  . Pulmonary hypertension, mild ([facility])    mild to moderate    Past Surgical History:  Procedure Laterality Date  . ABDOMINAL HYSTERECTOMY    . COLONOSCOPY WITH PROPOFOL N/A 11/14/2016   Procedure: COLONOSCOPY WITH PROPOFOL;  Surgeon: [name], MD;  Location: [facility];  Service: Endoscopy;  Laterality: N/A;  . long nodule    . POLYPECTOMY  11/14/2016   Procedure: POLYPECTOMY;  Surgeon: [name], MD;  Location: [facility];  Service: Endoscopy;;  . RIGHT HEART CATH Right 07/14/2017   Procedure: RIGHT HEART CATH;  Surgeon: Nelva Bush, MD;  Location: [facility];  Service: Cardiovascular;  Laterality: Right;    Prior to Admission medications   Medication Sig Start Date End Date Taking? Authorizing Provider  acetaminophen (TYLENOL) 325 MG tablet Take 325 mg by mouth every 6 (six) hours as needed (for pain.).   Yes [provider]  albuterol (PROVENTIL HFA;VENTOLIN HFA) 108 (90 Base) MCG/ACT inhaler Inhale 2 puffs into the lungs every 6 (six) hours as needed for wheezing or shortness of breath. 11/24/17  Yes [name], NP  amLODipine (NORVASC) 5 MG tablet Take 1 tablet (5 mg total) by mouth daily. 08/19/18  Yes Cannady, Henrine Screws T, NP  aspirin EC 81 MG tablet Take 81 mg by mouth daily.   Yes [provider]  ferrous  sulfate 325 (65 FE) MG tablet Take 65 mg by mouth daily with breakfast.   Yes [provider]  hydrochlorothiazide (MICROZIDE) 12.5 MG capsule Take 1 capsule (12.5 mg total) by mouth daily. 08/19/18  Yes Cannady, Jolene T, NP  lisinopril (PRINIVIL,ZESTRIL) 40 MG tablet Take 1 tablet (40 mg total) by mouth daily. 08/19/18  Yes Cannady, Jolene T, NP  tiotropium (SPIRIVA HANDIHALER) 18 MCG inhalation capsule Place 1 capsule (18 mcg total) into inhaler and inhale daily. 11/24/17  Yes [name], NP  vitamin B-12 (CYANOCOBALAMIN) 1000 MCG tablet Take 1,000 mcg by mouth daily.   Yes [provider]  Vitamin D, Cholecalciferol, 1000 units TABS Take 1 tablet by mouth daily.   Yes [provider]  atorvastatin (LIPITOR) 10 MG tablet Take 1 tablet (10 mg total) by mouth daily. Patient not taking: Reported on 11/10/2018 08/19/18   Marnee Guarneri T, NP  dicyclomine (BENTYL) 20 MG tablet Take 1 tablet (20 mg total) by mouth 3 (three) times daily as needed (abdominal pain). Patient not taking: Reported on 10/05/2018 07/25/18   Nance Pear, MD  fluticasone furoate-vilanterol (BREO ELLIPTA) 100-25 MCG/INH AEPB Inhale 1 puff into the lungs daily. Patient not taking: Reported on 09/21/2018 08/19/18   Marnee Guarneri T, NP  ondansetron (ZOFRAN) 4 MG tablet Take 1 tablet (4 mg total) by mouth every 8 (eight) hours as needed for nausea or vomiting. Patient not taking: Reported on 10/05/2018 07/25/18  Nance Pear, MD    Allergies as of 10/22/2018 - Review Complete 10/05/2018  Allergen Reaction Noted  . Latex Itching and Rash 07/13/2017    Family History  Problem Relation Age of Onset  . Heart disease Neg Hx     Social History   Socioeconomic History  . Marital status: Married    Spouse name: Not on file  . Number of children: Not on file  . Years of education: 3  . Highest education level: 12th grade  Occupational History  . Not on file  Social Needs  . Financial  resource strain: Hard  . Food insecurity:    Worry: Never true    Inability: Never true  . Transportation needs:    Medical: No    Non-medical: No  Tobacco Use  . Smoking status: Former Smoker    Packs/day: 0.50    Years: 30.00    Pack years: 15.00    Types: Cigarettes    Last attempt to quit: 01/17/2017    Years since quitting: 1.8  . Smokeless tobacco: Never Used  Substance and Sexual Activity  . Alcohol use: No    Alcohol/week: 0.0 standard drinks  . Drug use: No  . Sexual activity: Yes  Lifestyle  . Physical activity:    Days per week: 0 days    Minutes per session: 0 min  . Stress: Not at all  Relationships  . Social connections:    Talks on phone: Never    Gets together: Once a week    Attends religious service: More than 4 times per year    Active member of club or organization: No    Attends meetings of clubs or organizations: Never    Relationship status: Married  . Intimate partner violence:    Fear of current or ex partner: No    Emotionally abused: No    Physically abused: No    Forced sexual activity: No  Other Topics Concern  . Not on file  Social History Narrative  . Not on file    Review of Systems: See HPI, otherwise negative ROS  Physical Exam: BP (!) 143/64   Pulse 81   Temp 98.8 F (37.1 C) (Temporal)   Ht 5\' 1"  (1.549 m)   Wt 68.4 kg   LMP  (LMP Unknown)   SpO2 97%   BMI 28.47 kg/m  General:   Alert,  pleasant and cooperative in NAD Head:  Normocephalic and atraumatic. Neck:  Supple; no masses or thyromegaly. Lungs:  Clear throughout to auscultation.    Heart:  Regular rate and rhythm. Abdomen:  Soft, nontender and nondistended. Normal bowel sounds, without guarding, and without rebound.   Neurologic:  Alert and  oriented x4;  grossly normal neurologically.  Impression/Plan: Madison Franklin is here for an endoscopy and colonoscopy to be performed for persnal h/o colon polyps and IDA  Risks, benefits, limitations, and alternatives  regarding  endoscopy and colonoscopy have been reviewed with the patient.  Questions have been answered.  All parties agreeable.   Sherri Sear, MD  11/10/2018, 8:14 AM

## 2018-11-10 NOTE — Anesthesia Procedure Notes (Signed)
Procedure Name: MAC Date/Time: 11/10/2018 8:26 AM Performed by: Jeannene Patella, CRNA Pre-anesthesia Checklist: Patient identified, Emergency Drugs available, Suction available, Patient being monitored and Timeout performed Patient Re-evaluated:Patient Re-evaluated prior to induction Oxygen Delivery Method: Nasal cannula Preoxygenation: Pre-oxygenation with 100% oxygen Induction Type: IV induction

## 2018-11-10 NOTE — Op Note (Signed)
[facility] Patient Name: Madison Franklin Procedure Date: 11/10/2018 8:22 AM MRN: [id number] Account #: [id number] Date of Birth: 24-Mar-1946 Admit Type: Outpatient Age: 73 Room: [facility] OR ROOM 1 Gender: Female Note Status: Finalized Procedure:            Upper GI endoscopy Indications:          Unexplained iron deficiency anemia Providers:            Lin Landsman MD, MD Medicines:            Monitored Anesthesia Care Complications:        No immediate complications. Estimated blood loss: None. Procedure:            Pre-Anesthesia Assessment:                       - Prior to the procedure, a History and Physical was                        performed, and patient medications and allergies were                        reviewed. The patient is competent. The risks and                        benefits of the procedure and the sedation options and                        risks were discussed with the patient. All questions                        were answered and informed consent was obtained.                        Patient identification and proposed procedure were                        verified by the physician, the nurse, the                        anesthesiologist, the anesthetist and the technician in                        the pre-procedure area in the procedure room in the                        endoscopy suite. Mental Status Examination: alert and                        oriented. Airway Examination: normal oropharyngeal                        airway and neck mobility. Respiratory Examination:                        clear to auscultation. CV Examination: normal.                        Prophylactic Antibiotics: The patient does not require  prophylactic antibiotics. Prior Anticoagulants: The                        patient has taken no previous anticoagulant or                        antiplatelet agents. ASA Grade Assessment: III -  A                        patient with severe systemic disease. After reviewing                        the risks and benefits, the patient was deemed in                        satisfactory condition to undergo the procedure. The                        anesthesia plan was to use monitored anesthesia care                        (MAC). Immediately prior to administration of                        medications, the patient was re-assessed for adequacy                        to receive sedatives. The heart rate, respiratory rate,                        oxygen saturations, blood pressure, adequacy of                        pulmonary ventilation, and response to care were                        monitored throughout the procedure. The physical status                        of the patient was re-assessed after the procedure.                       After obtaining informed consent, the endoscope was                        passed under direct vision. Throughout the procedure,                        the patient's blood pressure, pulse, and oxygen                        saturations were monitored continuously. The was                        introduced through the mouth, and advanced to the                        second part of duodenum. The upper GI endoscopy was  accomplished without difficulty. The patient tolerated                        the procedure well. Findings:      The duodenal bulb and second portion of the duodenum were normal.      Multiple dispersed, diminutive non-bleeding erosions were found at the       incisura and in the gastric antrum. There were no stigmata of recent       bleeding. Biopsies were taken with a cold forceps for Helicobacter       pylori testing.      The gastric body was normal. Biopsies were taken with a cold forceps for       Helicobacter pylori testing.      The cardia and gastric fundus were normal on retroflexion.      Esophagogastric  landmarks were identified: the gastroesophageal junction       was found at 35 cm from the incisors.      A single 5 mm non-bleeding erosion was found in the upper third of the       esophagus.      The gastroesophageal junction was normal. Impression:           - Normal duodenal bulb and second portion of the                        duodenum.                       - Non-bleeding erosive gastropathy. Biopsied.                       - Normal gastric body. Biopsied.                       - Esophagogastric landmarks identified.                       - A single non-bleeding erosion in the upper third of                        the esophagus.                       - Normal gastroesophageal junction. Recommendation:       - Await pathology results.                       - Proceed with colonoscopy as scheduled                       See colonoscopy report Procedure Code(s):    --- Professional ---                       [phone], Esophagogastroduodenoscopy, flexible, transoral;                        with biopsy, single or multiple Diagnosis Code(s):    --- Professional ---                       K31.89, Other diseases of stomach and duodenum  K22.10, Ulcer of esophagus without bleeding                       D50.9, Iron deficiency anemia, unspecified CPT copyright 2018 [employer]. All rights reserved. The codes documented in this report are preliminary and upon coder review may  be revised to meet current compliance requirements. Dr. Ulyess Mort Lin Landsman MD, MD 11/10/2018 8:35:55 AM This report has been signed electronically. Number of Addenda: 0 Note Initiated On: 11/10/2018 8:22 AM Total Procedure Duration: 0 hours 4 minutes 21 seconds       [facility]

## 2018-11-11 ENCOUNTER — Encounter: Payer: Self-pay | Admitting: Gastroenterology

## 2018-11-16 ENCOUNTER — Encounter: Payer: Self-pay | Admitting: Gastroenterology

## 2018-11-17 ENCOUNTER — Encounter: Payer: Self-pay | Admitting: Gastroenterology

## 2018-11-17 ENCOUNTER — Ambulatory Visit: Payer: Self-pay | Admitting: Surgery

## 2018-11-18 ENCOUNTER — Other Ambulatory Visit: Payer: Self-pay | Admitting: Hematology and Oncology

## 2018-11-19 ENCOUNTER — Other Ambulatory Visit: Payer: Self-pay

## 2018-11-19 NOTE — Telephone Encounter (Signed)
Opened in error

## 2018-11-23 ENCOUNTER — Inpatient Hospital Stay: Payer: Medicare Other | Attending: Hematology and Oncology

## 2018-11-23 DIAGNOSIS — Z86010 Personal history of colonic polyps: Secondary | ICD-10-CM | POA: Diagnosis not present

## 2018-11-23 DIAGNOSIS — I129 Hypertensive chronic kidney disease with stage 1 through stage 4 chronic kidney disease, or unspecified chronic kidney disease: Secondary | ICD-10-CM | POA: Insufficient documentation

## 2018-11-23 DIAGNOSIS — D509 Iron deficiency anemia, unspecified: Secondary | ICD-10-CM

## 2018-11-23 DIAGNOSIS — E538 Deficiency of other specified B group vitamins: Secondary | ICD-10-CM | POA: Diagnosis not present

## 2018-11-23 DIAGNOSIS — R1901 Right upper quadrant abdominal swelling, mass and lump: Secondary | ICD-10-CM | POA: Diagnosis not present

## 2018-11-23 DIAGNOSIS — D631 Anemia in chronic kidney disease: Secondary | ICD-10-CM | POA: Insufficient documentation

## 2018-11-23 DIAGNOSIS — E611 Iron deficiency: Secondary | ICD-10-CM | POA: Insufficient documentation

## 2018-11-23 DIAGNOSIS — N183 Chronic kidney disease, stage 3 (moderate): Secondary | ICD-10-CM | POA: Diagnosis not present

## 2018-11-23 LAB — CBC WITH DIFFERENTIAL/PLATELET
Abs Immature Granulocytes: 0.04 10*3/uL (ref 0.00–0.07)
Basophils Absolute: 0.1 10*3/uL (ref 0.0–0.1)
Basophils Relative: 1 %
Eosinophils Absolute: 0.3 10*3/uL (ref 0.0–0.5)
Eosinophils Relative: 3 %
HCT: 36.2 % (ref 36.0–46.0)
Hemoglobin: 11.1 g/dL — ABNORMAL LOW (ref 12.0–15.0)
Immature Granulocytes: 0 %
Lymphocytes Relative: 17 %
Lymphs Abs: 1.7 10*3/uL (ref 0.7–4.0)
MCH: 26.6 pg (ref 26.0–34.0)
MCHC: 30.7 g/dL (ref 30.0–36.0)
MCV: 86.6 fL (ref 80.0–100.0)
Monocytes Absolute: 1 10*3/uL (ref 0.1–1.0)
Monocytes Relative: 10 %
Neutro Abs: 6.8 10*3/uL (ref 1.7–7.7)
Neutrophils Relative %: 69 %
Platelets: 188 10*3/uL (ref 150–400)
RBC: 4.18 MIL/uL (ref 3.87–5.11)
RDW: 14.9 % (ref 11.5–15.5)
WBC: 9.9 10*3/uL (ref 4.0–10.5)
nRBC: 0 % (ref 0.0–0.2)

## 2018-11-23 LAB — FERRITIN: Ferritin: 46 ng/mL (ref 11–307)

## 2018-11-24 ENCOUNTER — Inpatient Hospital Stay: Payer: Medicare Other

## 2018-11-24 ENCOUNTER — Other Ambulatory Visit: Payer: Self-pay | Admitting: Nurse Practitioner

## 2018-11-24 ENCOUNTER — Ambulatory Visit: Payer: Self-pay | Admitting: Surgery

## 2018-11-24 ENCOUNTER — Inpatient Hospital Stay (HOSPITAL_BASED_OUTPATIENT_CLINIC_OR_DEPARTMENT_OTHER): Payer: Medicare Other | Admitting: Hematology and Oncology

## 2018-11-24 ENCOUNTER — Encounter: Payer: Self-pay | Admitting: Hematology and Oncology

## 2018-11-24 VITALS — BP 143/66 | HR 74 | Temp 98.1°F | Resp 18 | Wt 152.8 lb

## 2018-11-24 DIAGNOSIS — D509 Iron deficiency anemia, unspecified: Secondary | ICD-10-CM

## 2018-11-24 DIAGNOSIS — E538 Deficiency of other specified B group vitamins: Secondary | ICD-10-CM | POA: Diagnosis not present

## 2018-11-24 DIAGNOSIS — E611 Iron deficiency: Secondary | ICD-10-CM | POA: Diagnosis not present

## 2018-11-24 DIAGNOSIS — Z86010 Personal history of colonic polyps: Secondary | ICD-10-CM

## 2018-11-24 DIAGNOSIS — I129 Hypertensive chronic kidney disease with stage 1 through stage 4 chronic kidney disease, or unspecified chronic kidney disease: Secondary | ICD-10-CM

## 2018-11-24 DIAGNOSIS — D631 Anemia in chronic kidney disease: Secondary | ICD-10-CM | POA: Diagnosis not present

## 2018-11-24 DIAGNOSIS — R1901 Right upper quadrant abdominal swelling, mass and lump: Secondary | ICD-10-CM

## 2018-11-24 DIAGNOSIS — N183 Chronic kidney disease, stage 3 unspecified: Secondary | ICD-10-CM

## 2018-11-24 MED ORDER — ALBUTEROL SULFATE HFA 108 (90 BASE) MCG/ACT IN AERS: 2.0000 | INHALATION_SPRAY | Freq: Four times a day (QID) | RESPIRATORY_TRACT | 2 refills | Status: DC | PRN

## 2018-11-24 NOTE — Progress Notes (Signed)
Pt here for follow up. Denies any concerns at this time.  

## 2018-11-24 NOTE — Progress Notes (Signed)
[facility] day:  11/24/2018  Chief Complaint: Madison Franklin is a 73 y.o. female with anemia of chronic kidney disease, iron deficiency, and B12 deficiency who is seen for 3 month assessment.  HPI:  The patient was last seen in the medical oncology clinic on 09/01/2018.  At that time,  she noted some improvement to her energy level.  She continued to experience exertional dyspnea and orthopnea.  She was on oral B12 and iron supplementation.  She denied any B symptoms.  Bowels were stable.  Exam revealed abdominal distention and a blottable mass in the RUQ.  Prior imaging revealed a porcelain gallbladder.  She was seen by Dr Caroleen Hamman on 09/13/2018.  Notes reviewed.  She was felt not to need emergent surgical intervention regarding her porcelain gallbladder.  She was felt to need a colonoscopy secondary to recent colitis and a history of multiple polyps.  She underwent EGD on 11/10/2018 by Dr. Marius Ditch.  Findings revealed non-bleeding erosive gastropathy. Pathology revealed chronic inactive atrophic gastritis with intestinal metaplasia.  There was a single non-bleeding erosion in the upper third of the esophagus.   Colonoscopy on 11/10/2018 revealed three 5 to 6 mm polyps in the ascending colon (2 tubular adenomas; sessile serrated adenoma), one 8 mm polyp in the transverse colon (tubular adenoma), one 5 mm polyp in the transverse colon (tubular adenoma), one 5 mm polyp in the sigmoid colon (tubular adenoma), one 7 mm polyp at the recto-sigmoid colon (hyperplastic polyp), and non-bleeding external and internal hemorrhoids.  She will have a follow-up colonoscopy in 3 years.  Labs have been followed: 10/11/2018:  Hematocrit 34.1, hemoglobin 10.3, MCV 84.6, platelets 178,000, WBC 9400 with an ANC of 6600.  Ferritin was 33. 11/23/2018:  Hematocrit 36.2, hemoglobin 11.1, MCV 86.6, platelets 188,000, WBC 9900 with an ANC of 6800.  Ferritin was 46.  During the  interim, patient is doing well today. She does not make note of any acute or concerning symptoms. Her energy is stable. She denies associated shortness of breath, chest pain, or episodes of palpitations.  She feels generally well. Patient denies that she has experienced any B symptoms. She denies any interval infections.  Patient is scheduled to follow up with surgery Dahlia Byes, MD) regarding her known porcelain gallbladder.   Patient advises that she maintains an adequate appetite. She is eating well. Weight today is 152 lb 12.5 oz (69.3 kg), which compared to her last visit to the clinic, represents a 3 pound increase.   Patient denies pain in the clinic today.   Past Medical History:  Diagnosis Date  . COPD (chronic obstructive pulmonary disease) ([place])   . Dyspnea   . Hearing aid worn    bilateral  . Hyperlipidemia   . Hypertension   . Osteoporosis   . Personal history of tobacco use, presenting hazards to health 08/15/2015  . Pulmonary hypertension, mild (HCC)    mild to moderate    Past Surgical History:  Procedure Laterality Date  . ABDOMINAL HYSTERECTOMY    . COLONOSCOPY WITH PROPOFOL N/A 11/14/2016   Procedure: COLONOSCOPY WITH PROPOFOL;  Surgeon: [name], MD;  Location: [place];  Service: Endoscopy;  Laterality: N/A;  . COLONOSCOPY WITH PROPOFOL N/A 11/10/2018   Procedure: COLONOSCOPY WITH PROPOFOL;  Surgeon: Lin Landsman, MD;  Location: [place];  Service: Endoscopy;  Laterality: N/A;  . ESOPHAGOGASTRODUODENOSCOPY (EGD) WITH PROPOFOL  11/10/2018   Procedure: ESOPHAGOGASTRODUODENOSCOPY (EGD) WITH PROPOFOL;  Surgeon:  Lin Landsman, MD;  Location: [place];  Service: Endoscopy;;  . long nodule    . POLYPECTOMY  11/14/2016   Procedure: POLYPECTOMY;  Surgeon: [name], MD;  Location: [place];  Service: Endoscopy;;  . POLYPECTOMY  11/10/2018   Procedure: POLYPECTOMY;  Surgeon: Lin Landsman, MD;  Location: [place];  Service: Endoscopy;;  . RIGHT HEART CATH Right 07/14/2017   Procedure: RIGHT HEART CATH;  Surgeon: Nelva Bush, MD;  Location: [place] [facility];  Service: Cardiovascular;  Laterality: Right;    Family History  Problem Relation Age of Onset  . Heart disease Neg Hx     Social History:  reports that she quit smoking about 22 months ago. Her smoking use included cigarettes. She has a 15.00 pack-year smoking history. She has never used smokeless tobacco. She reports that she does not drink alcohol or use drugs.  She smoked 1/2 pack/day x 30 years.  She quit in 2018.  She is a retired Secretary/administrator.  The patient is accompanied by her husband, Elenore Rota,  today.  Allergies:  Allergies  Allergen Reactions  . Latex Itching and Rash    With latex gloves (when worn)    Current Medications: Current Outpatient Medications  Medication Sig Dispense Refill  . acetaminophen (TYLENOL) 325 MG tablet Take 325 mg by mouth every 6 (six) hours as needed (for pain.).    Marland Kitchen amLODipine (NORVASC) 5 MG tablet Take 1 tablet (5 mg total) by mouth daily. 90 tablet 3  . aspirin EC 81 MG tablet Take 81 mg by mouth daily.    Marland Kitchen atorvastatin (LIPITOR) 10 MG tablet Take 1 tablet (10 mg total) by mouth daily. 90 tablet 3  . ferrous sulfate 325 (65 FE) MG tablet Take 65 mg by mouth daily with breakfast.    . hydrochlorothiazide (MICROZIDE) 12.5 MG capsule Take 1 capsule (12.5 mg total) by mouth daily. 90 capsule 3  . lisinopril (PRINIVIL,ZESTRIL) 40 MG tablet Take 1 tablet (40 mg total) by mouth daily. 90 tablet 3  . vitamin B-12 (CYANOCOBALAMIN) 1000 MCG tablet Take 1,000 mcg by mouth daily.    . Vitamin D, Cholecalciferol, 1000 units TABS Take 1 tablet by mouth daily.    Marland Kitchen albuterol (PROVENTIL HFA;VENTOLIN HFA) 108 (90 Base) MCG/ACT inhaler Inhale 2 puffs into the lungs every 6 (six) hours as needed for wheezing or shortness of breath. (Patient not taking: Reported on 11/24/2018) 1 Inhaler 2   No current  facility-administered medications for this visit.     Review of Systems  Constitutional: Negative.  Negative for chills, diaphoresis, fever, malaise/fatigue and weight loss (up 3 pounds).       Doing "alright".  HENT: Positive for hearing loss. Negative for congestion, ear pain, nosebleeds, sinus pain and sore throat.   Eyes: Negative.  Negative for blurred vision, double vision and photophobia.  Respiratory: Positive for shortness of breath (exertional). Negative for cough, hemoptysis and sputum production.   Cardiovascular: Negative.  Negative for chest pain, palpitations, orthopnea, leg swelling and PND.  Gastrointestinal: Negative.  Negative for abdominal pain, blood in stool, constipation, diarrhea, melena, nausea and vomiting.  Genitourinary: Negative.  Negative for dysuria, frequency, hematuria and urgency.       CKD-III.  Musculoskeletal: Negative.  Negative for back pain, falls, joint pain, myalgias and neck pain.  Skin: Negative.  Negative for itching and rash.  Neurological: Negative.  Negative for dizziness, tingling, tremors, sensory change, speech change, focal weakness, weakness and headaches.  Endo/Heme/Allergies: Negative.  Does not bruise/bleed easily.  Psychiatric/Behavioral: Negative.  Negative for depression and memory loss. The patient is not nervous/anxious and does not have insomnia.   All other systems reviewed and are negative.  Performance status (ECOG): 1  Vital Signs BP (!) 143/66 (BP Location: Left Arm, Patient Position: Sitting)   Pulse 74   Temp 98.1 F (36.7 C) (Oral)   Resp 18   Wt 152 lb 12.5 oz (69.3 kg)   LMP  (LMP Unknown)   SpO2 98%   BMI 28.87 kg/m   Physical Exam  Constitutional: She is well-developed, well-nourished, and in no distress. No distress.  HENT:  Head: Normocephalic and atraumatic.  Mouth/Throat: Oropharynx is clear and moist and mucous membranes are normal. No oropharyngeal exudate.  Long graying braided hair.  Eyes: Pupils  are equal, round, and reactive to light. Conjunctivae and EOM are normal. No scleral icterus.  Neck: Normal range of motion. Neck supple. No JVD present.  Cardiovascular: Normal rate, regular rhythm, normal heart sounds and intact distal pulses. Exam reveals no gallop and no friction rub.  No murmur heard. Pulmonary/Chest: Effort normal and breath sounds normal. No respiratory distress. She has no wheezes. She has no rales.  Abdominal: Soft. Bowel sounds are normal. She exhibits mass (RUQ blottable mass (? porcelain gallbladder)). She exhibits no distension. There is no abdominal tenderness. There is no rebound and no guarding.  Fully round.  Musculoskeletal: Normal range of motion.        General: No tenderness or edema.  Lymphadenopathy:    She has no cervical adenopathy.    She has no axillary adenopathy.       Right: No inguinal and no supraclavicular adenopathy present.       Left: No inguinal and no supraclavicular adenopathy present.  Neurological: She is alert. Gait normal.  Skin: Skin is warm and dry. No rash noted. She is not diaphoretic. No erythema. No pallor.  Psychiatric: Mood, affect and judgment normal.  Nursing note and vitals reviewed.   Appointment on 11/23/2018  Component Date Value Ref Range Status  . Ferritin 11/23/2018 46  11 - 307 ng/mL Final   Performed at [facility], [address] [place], [place] [address]  . WBC 11/23/2018 9.9  4.0 - 10.5 K/uL Final  . RBC 11/23/2018 4.18  3.87 - 5.11 MIL/uL Final  . Hemoglobin 11/23/2018 11.1* 12.0 - 15.0 g/dL Final  . HCT 11/23/2018 36.2  36.0 - 46.0 % Final  . MCV 11/23/2018 86.6  80.0 - 100.0 fL Final  . MCH 11/23/2018 26.6  26.0 - 34.0 pg Final  . MCHC 11/23/2018 30.7  30.0 - 36.0 g/dL Final  . RDW 11/23/2018 14.9  11.5 - 15.5 % Final  . Platelets 11/23/2018 188  150 - 400 K/uL Final  . nRBC 11/23/2018 0.0  0.0 - 0.2 % Final  . Neutrophils Relative % 11/23/2018 69  % Final  . Neutro Abs 11/23/2018 6.8   1.7 - 7.7 K/uL Final  . Lymphocytes Relative 11/23/2018 17  % Final  . Lymphs Abs 11/23/2018 1.7  0.7 - 4.0 K/uL Final  . Monocytes Relative 11/23/2018 10  % Final  . Monocytes Absolute 11/23/2018 1.0  0.1 - 1.0 K/uL Final  . Eosinophils Relative 11/23/2018 3  % Final  . Eosinophils Absolute 11/23/2018 0.3  0.0 - 0.5 K/uL Final  . Basophils Relative 11/23/2018 1  % Final  . Basophils Absolute 11/23/2018 0.1  0.0 - 0.1 K/uL Final  .  Immature Granulocytes 11/23/2018 0  % Final  . Abs Immature Granulocytes 11/23/2018 0.04  0.00 - 0.07 K/uL Final   Performed at [facility], [address] [place], [place] [address]    Assessment:  DEVINN VOSHELL is a 73 y.o. female with anemia of chronic kidney disease.  She has had a gradual decline in renal function of the course of the last 8 months  Work-up on 05/26/2018 revealed a hematocrit of 33.5, hemoglobin 10.3, and MCV 78.4.  Ferritin was 20.  Iron saturation was 8% with a TIBC of 395.  B12 was 259 (low).  Folate was 10.3.  Creatinine was 1.51.  She has iron deficiency anemia and B12 deficiency.  She is on oral B12.  B12 was 259 on 05/26/2018 and 1080 on 08/03/2018.  Anti-parietal antibody and intrinsic factor antibodies were negative on 06/30/2018.  Oral iron began 08/04/2018.  Ferritin has been followed: 31 on 11/24/2017, 20 on 05/26/2018, 17 on 06/30/2018, 27 on 08/03/2018, and 34 on 08/31/2018.  She has a history of colonic polyps.   Colonoscopy on 11/14/2016 revealed a sub-optimal prep. Findings included a 10 mm polyp in the cecum, seven 4-10 mm sessile polyps in the transverse colon, a 6 mm polyp in the descending colon, three 5-6 mm sessile polyps in the sigmoid colon,.  Pathology revealed 7 tubular adenomas, 2 hyperplastic polyps, and 2 inflammatory polyps.  Colonoscopy on 11/10/2018 revealed three 5 to 6 mm polyps in the ascending colon (2 tubular adenomas; sessile serrated adenoma), one 8 mm polyp in the transverse colon (tubular  adenoma), one 5 mm polyp in the transverse colon (tubular adenoma), one 5 mm polyp in the sigmoid colon (tubular adenoma), one 7 mm polyp at the recto-sigmoid colon (hyperplastic polyp), and non-bleeding external and internal hemorrhoids.  She will have a follow-up colonoscopy in 3 years.  EGD on 11/10/2018 revealed non-bleeding erosive gastropathy. Pathology revealed chronic inactive atrophic gastritis with intestinal metaplasia.  There was a single non-bleeding erosion in the upper third of the esophagus.   She has a 15 pack year smoking history.  Chest CT on 03/13/2017 revealed a 4 mm LUL pulmonary nodule (decreased from prior).  Low dose chest CT on 06/01/2018 revealed Lung-RADS 2, benign appearance or behavior.   There was gallbladder wall calcifications indicative of "porcelain gallbladder".   Abdomen and pelvic CT on 07/25/2018 revealed a chronically abnormal gallbladder with circumferential partially calcified wall thickening and heterogeneity. This was partially visible in 2016 and 2015.  There appears to be an exophytic 3 cm component of calcified soft tissue extending inferiorly from the main body of the gallbladder.  Obliterated lumen perhaps that is gallbladder fundus is uncertain.  Symptomatically, she feels "alright".  Exam reveals a blottable gallbladder.  Hemoglobin is 11.1.  Ferritin is 46.  Plan: 1. Review labs from 11/23/2018. 2. Anemia of chronic renal disease Labs reviewed.  Hemoglobin 11.1 and hematocrit 36.2. No Procrit today. 3. Iron deficiency Ferritin 46. Iron stores adequate. Continue ferrous sulfate 325 mg a day with a source of vitamin C. Goal ferritin 100. 4. B12 deficiency B12 level was 1075 on 08/31/2018. Continues oral B12. Folate was 10.3 on 05/26/2018. Check folate yearly. 5. RUQ mass Prominent mass in RUQ felt secondary to porcelain gallbladder. Patient has follow-up scheduled with Dr. Dahlia Byes. 6.  Colon polyps  Review recent colonoscopy.  Multiple  polyps (tubular adenomas) seen.  Followup colonoscopy 11/10/2021.  7.   RTC in 3 months for labs (CBC with diff, ferritin)  8.   RTC in 6 months for MD assessment, labs (CBC with diff, ferritin - day before), and +/- Venofer.    Honor Loh, NP  11/24/2018, 1:47 PM   I saw and evaluated the patient, participating in the key portions of the service and reviewing pertinent diagnostic studies and records.  I reviewed the nurse practitioner's note and agree with the findings and the plan.  The assessment and plan were discussed with the patient.  Multiple questions were asked by the patient and answered.   Nolon Stalls, MD 11/24/2018,1:47 PM

## 2018-11-24 NOTE — Telephone Encounter (Signed)
Copied from [place] [phone]. Topic: Quick Communication - Rx Refill/Question >> Nov 24, 2018  3:41 PM Mcneil, Ja-Kwan wrote: Medication:  albuterol (PROVENTIL HFA;VENTOLIN HFA) 108 (90 Base) MCG/ACT inhaler   Has the patient contacted their pharmacy? yes   Preferred Pharmacy (with phone number or street name): [place] [facility], [place] [phone] (Phone)  [phone] (Fax)  Agent: Please be advised that RX refills may take up to 3 business days. We ask that you follow-up with your pharmacy.

## 2018-11-24 NOTE — Telephone Encounter (Signed)
Requested medication (s) are due for refill today -yes- Rx expired  Requested medication (s) are on the active medication list -yes- although reported not taking  Future visit scheduled -yes  Last refill: 11/24/17  Notes to clinic: Patient is requesting refill of inhaler- sent for Cannady review- as Julian Hy wrote original Rx  Requested Prescriptions  Pending Prescriptions Disp Refills   albuterol (PROVENTIL HFA;VENTOLIN HFA) 108 (90 Base) MCG/ACT inhaler 1 Inhaler 2    Sig: Inhale 2 puffs into the lungs every 6 (six) hours as needed for wheezing or shortness of breath.     Pulmonology:  Beta Agonists Failed - 11/24/2018  3:43 PM      Failed - One inhaler should last at least one month. If the patient is requesting refills earlier, contact the patient to check for uncontrolled symptoms.      Passed - Valid encounter within last 12 months    Recent Outpatient Visits          9 months ago Colon cancer screening   [facility] [name], NP   1 year ago Chronic kidney disease, stage 3 ([place])   [place] [name], NP   1 year ago Mixed hyperlipidemia   [facility] [name], NP   1 year ago COPD, severe [facility])   [place], NP   2 years ago Annual physical exam   [facility], [place]      Future Appointments            In 3 weeks Vanga, Tally Due, MD  [facility]   In 10 months  [place], [facility]            Requested Prescriptions  Pending Prescriptions Disp Refills   albuterol (PROVENTIL HFA;VENTOLIN HFA) 108 (90 Base) MCG/ACT inhaler 1 Inhaler 2    Sig: Inhale 2 puffs into the lungs every 6 (six) hours as needed for wheezing or shortness of breath.     Pulmonology:  Beta Agonists Failed - 11/24/2018  3:43 PM      Failed - One inhaler should last at least one month. If the patient is requesting refills earlier, contact the patient to check  for uncontrolled symptoms.      Passed - Valid encounter within last 12 months    Recent Outpatient Visits          9 months ago Colon cancer screening   [facility] [name], NP   1 year ago Chronic kidney disease, stage 3 ([place])   [facility] [name], NP   1 year ago Mixed hyperlipidemia   [facility] [name], NP   1 year ago COPD, severe [facility])   [place] [name], NP   2 years ago Annual physical exam   [facility], [place]      Future Appointments            In 3 weeks Vanga, Tally Due, MD [place]   In 10 months  [facility], [place]

## 2018-12-01 ENCOUNTER — Encounter: Payer: Self-pay | Admitting: Surgery

## 2018-12-01 ENCOUNTER — Other Ambulatory Visit: Payer: Self-pay

## 2018-12-01 ENCOUNTER — Ambulatory Visit (INDEPENDENT_AMBULATORY_CARE_PROVIDER_SITE_OTHER): Payer: Medicare Other | Admitting: Surgery

## 2018-12-01 VITALS — BP 152/70 | HR 85 | Temp 97.3°F | Resp 16 | Ht 61.0 in | Wt 153.0 lb

## 2018-12-01 DIAGNOSIS — K828 Other specified diseases of gallbladder: Secondary | ICD-10-CM | POA: Diagnosis not present

## 2018-12-01 NOTE — Patient Instructions (Addendum)
Patient will need to return to the office in 6 months with a RUQ U/S.  The patient is aware to call back for any questions or new concerns.

## 2018-12-03 ENCOUNTER — Encounter: Payer: Self-pay | Admitting: Surgery

## 2018-12-03 NOTE — Progress Notes (Signed)
Patient ID: Madison Franklin, female   DOB: [birth year], 73 y.o.   MRN: [id number]  HPI Madison Franklin is a 73 y.o. female with a porcelain gallbladder.  She is debilitated and with significant dyspnea on exertion.  She had a recent colonoscopy and having multiple polyps removed. Denies specifically any abdominal pain.  Daughter is concerned about her overall health HPI  Past Medical History:  Diagnosis Date  . COPD (chronic obstructive pulmonary disease) ([place])   . Dyspnea   . Hearing aid worn    bilateral  . Hyperlipidemia   . Hypertension   . Osteoporosis   . Personal history of tobacco use, presenting hazards to health 08/15/2015  . Pulmonary hypertension, mild (HCC)    mild to moderate    Past Surgical History:  Procedure Laterality Date  . ABDOMINAL HYSTERECTOMY    . COLONOSCOPY WITH PROPOFOL N/A 11/14/2016   Procedure: COLONOSCOPY WITH PROPOFOL;  Surgeon: [name], MD;  Location: [place];  Service: Endoscopy;  Laterality: N/A;  . COLONOSCOPY WITH PROPOFOL N/A 11/10/2018   Procedure: COLONOSCOPY WITH PROPOFOL;  Surgeon: Lin Landsman, MD;  Location: [place];  Service: Endoscopy;  Laterality: N/A;  . ESOPHAGOGASTRODUODENOSCOPY (EGD) WITH PROPOFOL  11/10/2018   Procedure: ESOPHAGOGASTRODUODENOSCOPY (EGD) WITH PROPOFOL;  Surgeon: Lin Landsman, MD;  Location: [place];  Service: Endoscopy;;  . long nodule    . POLYPECTOMY  11/14/2016   Procedure: POLYPECTOMY;  Surgeon: [name], MD;  Location: [place];  Service: Endoscopy;;  . POLYPECTOMY  11/10/2018   Procedure: POLYPECTOMY;  Surgeon: Lin Landsman, MD;  Location: [place];  Service: Endoscopy;;  . RIGHT HEART CATH Right 07/14/2017   Procedure: RIGHT HEART CATH;  Surgeon: Nelva Bush, MD;  Location: [place] [facility];  Service: Cardiovascular;  Laterality: Right;    Family History  Problem Relation Age of Onset  . Heart disease Neg Hx     Social  History Social History   Tobacco Use  . Smoking status: Former Smoker    Packs/day: 0.50    Years: 30.00    Pack years: 15.00    Types: Cigarettes    Last attempt to quit: 01/17/2017    Years since quitting: 1.8  . Smokeless tobacco: Never Used  Substance Use Topics  . Alcohol use: No    Alcohol/week: 0.0 standard drinks  . Drug use: No    Allergies  Allergen Reactions  . Latex Itching and Rash    With latex gloves (when worn)    Current Outpatient Medications  Medication Sig Dispense Refill  . acetaminophen (TYLENOL) 325 MG tablet Take 325 mg by mouth every 6 (six) hours as needed (for pain.).    Marland Kitchen albuterol (PROVENTIL HFA;VENTOLIN HFA) 108 (90 Base) MCG/ACT inhaler Inhale 2 puffs into the lungs every 6 (six) hours as needed for wheezing or shortness of breath. 1 Inhaler 2  . amLODipine (NORVASC) 5 MG tablet Take 1 tablet (5 mg total) by mouth daily. 90 tablet 3  . aspirin EC 81 MG tablet Take 81 mg by mouth daily.    Marland Kitchen atorvastatin (LIPITOR) 10 MG tablet Take 1 tablet (10 mg total) by mouth daily. 90 tablet 3  . ferrous sulfate 325 (65 FE) MG tablet Take 65 mg by mouth daily with breakfast.    . hydrochlorothiazide (MICROZIDE) 12.5 MG capsule Take 1 capsule (12.5 mg total) by mouth daily. 90 capsule 3  . lisinopril (PRINIVIL,ZESTRIL) 40 MG tablet Take 1  tablet (40 mg total) by mouth daily. 90 tablet 3  . vitamin B-12 (CYANOCOBALAMIN) 1000 MCG tablet Take 1,000 mcg by mouth daily.    . Vitamin D, Cholecalciferol, 1000 units TABS Take 1 tablet by mouth daily.     No current facility-administered medications for this visit.      Review of Systems Full ROS  was asked and was negative except for the information on the HPI  Physical Exam Blood pressure (!) 152/70, pulse 85, temperature (!) 97.3 F (36.3 C), temperature source Temporal, resp. rate 16, height 5\' 1"  (1.549 m), weight 153 lb (69.4 kg), SpO2 93 %. CONSTITUTIONAL:. EYES: Pupils are equal, round, and reactive to  light, Sclera are non-icteric. EARS, NOSE, MOUTH AND THROAT: The oropharynx is clear. The oral mucosa is pink and moist. Hearing is intact to voice. LYMPH NODES:  Lymph nodes in the neck are normal. RESPIRATORY:  Lungs are clear. There is normal respiratory effort, with equal breath sounds bilaterally, and without pathologic use of accessory muscles. CARDIOVASCULAR: Heart is regular without murmurs, gallops, or rubs. GI: The abdomen is soft, nontender, and nondistended. There are no palpable masses. There is no hepatosplenomegaly. There are normal bowel sounds in all quadrants. GU: Rectal deferred.   MUSCULOSKELETAL: Normal muscle strength and tone. No cyanosis or edema.   SKIN: Turgor is good and there are no pathologic skin lesions or ulcers. NEUROLOGIC: Motor and sensation is grossly normal. Cranial nerves are grossly intact. PSYCH:  Oriented to person, place and time. Affect is normal.  Data Reviewed  I have personally reviewed the patient's imaging, laboratory findings and medical records.    Assessment/Plan Debilitated 73 year old female with porcelain gallbladder.  I am concerned about her overall health COPD and severe scoliosis.  The ability for her to tolerate a general anesthetic.  I have discussed with the patient in detail about my concerns.  She is also concerned as well as the daughter that she may not well with a general anesthetic.  An alternative would be to follow her up in 6 months with an ultrasound.  They wish to avoid any surgery if at all possible.  We will see her back in 6 months Time spent with the patient was 25 minutes, with more than 50% of the time spent in face-to-face education, counseling and care coordination.     Caroleen Hamman, MD FACS General Surgeon 12/03/2018, 10:50 AM

## 2018-12-13 ENCOUNTER — Ambulatory Visit: Payer: Medicare Other | Admitting: Gastroenterology

## 2018-12-15 ENCOUNTER — Other Ambulatory Visit: Payer: Self-pay

## 2018-12-15 ENCOUNTER — Encounter: Payer: Self-pay | Admitting: Gastroenterology

## 2018-12-15 ENCOUNTER — Ambulatory Visit: Payer: Medicare Other | Admitting: Gastroenterology

## 2018-12-15 VITALS — BP 146/92 | HR 88 | Resp 17 | Ht 61.0 in | Wt 154.0 lb

## 2018-12-15 DIAGNOSIS — D508 Other iron deficiency anemias: Secondary | ICD-10-CM

## 2018-12-15 DIAGNOSIS — Z86010 Personal history of colonic polyps: Secondary | ICD-10-CM | POA: Diagnosis not present

## 2018-12-15 DIAGNOSIS — K2940 Chronic atrophic gastritis without bleeding: Secondary | ICD-10-CM | POA: Insufficient documentation

## 2018-12-15 NOTE — Progress Notes (Signed)
Madison Darby, MD [address]  [place]  [place], [place] [address]  Main: [phone]  Fax: [phone]    Gastroenterology Consultation  Referring Provider:     Venita Lick, NP Primary Care Physician:  Venita Lick, NP Primary Gastroenterologist:  Dr. [name] Reason for Consultation:     Iron deficiency anemia, atrophic gastritis        HPI:   Mad[place] is a 73 y.o. female referred by Dr. Venita Lick, NP  for consultation & management of chronic iron deficiency and B12 deficiency anemia.  Initially saw this patient for surveillance colonoscopy.  From her labs, she was found to have iron deficiency, therefore I performed upper endoscopy along with a colonoscopy.  Gastric biopsies came back as atrophic gastritis with intestinal metaplasia and negative for H. pylori.  There was no evidence of dysplasia.  Currently, she is taking oral iron 1 pill daily along with B12.  Her anemia significantly improved with oral iron and B12 replacement.  She denies any GI symptoms. She quit smoking 1 year ago, she denies black stools, rectal bleeding  Patient was seen by Dr. Dahlia Byes for porcelain gallbladder and deferred cholecystectomy due to low risk for malignancy  Patient is accompanied by her daughter today.  NSAIDs: None  Antiplts/Anticoagulants/Anti thrombotics: None  GI Procedures:  EGD and colonoscopy 11/10/2018 - Normal duodenal bulb and second portion of the duodenum. - Non-bleeding erosive gastropathy. Biopsied. - Normal gastric body. Biopsied. - Esophagogastric landmarks identified. - A single non-bleeding erosion in the upper third of the esophagus. - Normal gastroesophageal junction.  - Three 5 to 6 mm polyps in the ascending colon, removed with a cold snare. Resected and retrieved. Clip (MR conditional) was placed. - One 8 mm polyp in the transverse colon, removed with a hot snare. Resected and retrieved. - One 5 mm polyp in the transverse  colon, removed with a cold snare. Resected and retrieved. - One 5 mm polyp in the sigmoid colon, removed with a cold snare. Resected and retrieved. - One 7 mm polyp at the recto-sigmoid colon, removed with a hot snare. Resected and retrieved. - Non-bleeding external and internal hemorrhoids.    Colonoscopy 11/14/2016  Past Medical History:  Diagnosis Date  . COPD (chronic obstructive pulmonary disease) ([facility])   . Dyspnea   . Hearing aid worn    bilateral  . Hyperlipidemia   . Hypertension   . Osteoporosis   . Personal history of tobacco use, presenting hazards to health 08/15/2015  . Pulmonary hypertension, mild ([facility])    mild to moderate    Past Surgical History:  Procedure Laterality Date  . ABDOMINAL HYSTERECTOMY    . COLONOSCOPY WITH PROPOFOL N/A 11/14/2016   Procedure: COLONOSCOPY WITH PROPOFOL;  Surgeon: [name], MD;  Location: [facility];  Service: Endoscopy;  Laterality: N/A;  . COLONOSCOPY WITH PROPOFOL N/A 11/10/2018   Procedure: COLONOSCOPY WITH PROPOFOL;  Surgeon: Lin Landsman, MD;  Location: [facility];  Service: Endoscopy;  Laterality: N/A;  . ESOPHAGOGASTRODUODENOSCOPY (EGD) WITH PROPOFOL  11/10/2018   Procedure: ESOPHAGOGASTRODUODENOSCOPY (EGD) WITH PROPOFOL;  Surgeon: Lin Landsman, MD;  Location: [facility];  Service: Endoscopy;;  . long nodule    . POLYPECTOMY  11/14/2016   Procedure: POLYPECTOMY;  Surgeon: [name], MD;  Location: [facility];  Service: Endoscopy;;  . POLYPECTOMY  11/10/2018   Procedure: POLYPECTOMY;  Surgeon: Lin Landsman, MD;  Location: [facility];  Service: Endoscopy;;  . RIGHT HEART CATH Right 07/14/2017   Procedure: RIGHT HEART CATH;  Surgeon: Nelva Bush, MD;  Location: [facility];  Service: Cardiovascular;  Laterality: Right;    Current Outpatient Medications:  .  acetaminophen (TYLENOL) 325 MG tablet, Take 325 mg by mouth every 6 (six) hours as needed  (for pain.)., Disp: , Rfl:  .  albuterol (PROVENTIL HFA;VENTOLIN HFA) 108 (90 Base) MCG/ACT inhaler, Inhale 2 puffs into the lungs every 6 (six) hours as needed for wheezing or shortness of breath., Disp: 1 Inhaler, Rfl: 2 .  amLODipine (NORVASC) 5 MG tablet, Take 1 tablet (5 mg total) by mouth daily., Disp: 90 tablet, Rfl: 3 .  aspirin EC 81 MG tablet, Take 81 mg by mouth daily., Disp: , Rfl:  .  atorvastatin (LIPITOR) 10 MG tablet, Take 1 tablet (10 mg total) by mouth daily., Disp: 90 tablet, Rfl: 3 .  ferrous sulfate 325 (65 FE) MG tablet, Take 65 mg by mouth daily with breakfast., Disp: , Rfl:  .  hydrochlorothiazide (MICROZIDE) 12.5 MG capsule, Take 1 capsule (12.5 mg total) by mouth daily., Disp: 90 capsule, Rfl: 3 .  lisinopril (PRINIVIL,ZESTRIL) 40 MG tablet, Take 1 tablet (40 mg total) by mouth daily., Disp: 90 tablet, Rfl: 3 .  vitamin B-12 (CYANOCOBALAMIN) 1000 MCG tablet, Take 1,000 mcg by mouth daily., Disp: , Rfl:  .  Vitamin D, Cholecalciferol, 1000 units TABS, Take 1 tablet by mouth daily., Disp: , Rfl:     Family History  Problem Relation Age of Onset  . Heart disease Neg Hx      Social History   Tobacco Use  . Smoking status: Former Smoker    Packs/day: 0.50    Years: 30.00    Pack years: 15.00    Types: Cigarettes    Last attempt to quit: 01/17/2017    Years since quitting: 1.9  . Smokeless tobacco: Never Used  Substance Use Topics  . Alcohol use: No    Alcohol/week: 0.0 standard drinks  . Drug use: No    Allergies as of 12/15/2018 - Review Complete 12/15/2018  Allergen Reaction Noted  . Latex Itching and Rash 07/13/2017    Review of Systems:    All systems reviewed and negative except where noted in HPI.   Physical Exam:  BP (!) 146/92 (BP Location: Left Arm, Patient Position: Sitting, Cuff Size: Normal)   Pulse 88   Resp 17   Ht 5\' 1"  (1.549 m)   Wt 154 lb (69.9 kg)   LMP  (LMP Unknown)   BMI 29.10 kg/m  No LMP recorded (lmp unknown). Patient  has had a hysterectomy.  General:   Alert,  Well-developed, well-nourished, pleasant and cooperative in NAD Head:  Normocephalic and atraumatic. Eyes:  Sclera clear, no icterus.   Conjunctiva pink. Ears:  Normal auditory acuity. Nose:  No deformity, discharge, or lesions. Mouth:  No deformity or lesions,oropharynx pink & moist. Neck:  Supple; no masses or thyromegaly. Lungs:  Respirations even and unlabored.  Clear throughout to auscultation.   No wheezes, crackles, or rhonchi. No acute distress. Heart:  Regular rate and rhythm; no murmurs, clicks, rubs, or gallops. Abdomen:  Normal bowel sounds. Soft, obese, non-tender, without masses, hepatosplenomegaly or hernias noted.  No guarding or rebound tenderness.   Rectal: Not performed Msk:  Symmetrical without gross deformities. Good, equal movement & strength bilaterally. Pulses:  Normal pulses noted. Extremities:  No clubbing or edema.  No cyanosis. Neurologic:  Alert  and oriented x3;  grossly normal neurologically. Skin:  Intact without significant lesions or rashes. No jaundice. Psych:  Alert and cooperative. Normal mood and affect.  Imaging Studies: Reviewed  Assessment and Plan:   SHANTRELL PLACZEK is a 73 y.o. female with history of COPD, chronic iron and B12 deficiency anemia seen as a follow-up of endoscopy and colonoscopy results.  Iron and B12 deficiency anemia, improving EGD revealed atrophic gastritis with intestinal metaplasia, no evidence of H. pylori, no evidence of dysplasia Check antiparietal cell and anti-intrinsic factor antibodies I will discuss about repeat EGD with gastric mapping during next visit She can stop oral B12 at this time as her B12 levels are greater than thousand She can continue oral iron 1 pill a day Recheck labs in 3 months   Multiple tubular adenomas of colon, > 10 cumulative Recommend seeing a genetic counselor, referral placed Recommend surveillance colonoscopy in 3 years   Follow up in 3  months   Madison Darby, MD

## 2018-12-16 LAB — INTRINSIC FACTOR ANTIBODIES: Intrinsic Factor Abs, Serum: 1.1 AU/mL (ref 0.0–1.1)

## 2018-12-16 LAB — ANTI-PARIETAL ANTIBODY: Parietal Cell Ab: 3.4 Units (ref 0.0–20.0)

## 2019-02-23 ENCOUNTER — Other Ambulatory Visit: Payer: Medicare Other

## 2019-03-18 ENCOUNTER — Ambulatory Visit: Payer: Medicare Other | Admitting: Gastroenterology

## 2019-03-23 ENCOUNTER — Inpatient Hospital Stay: Payer: Medicare Other | Attending: Hematology and Oncology

## 2019-03-23 ENCOUNTER — Other Ambulatory Visit: Payer: Self-pay

## 2019-03-23 DIAGNOSIS — E611 Iron deficiency: Secondary | ICD-10-CM | POA: Diagnosis not present

## 2019-03-23 DIAGNOSIS — E538 Deficiency of other specified B group vitamins: Secondary | ICD-10-CM

## 2019-03-23 DIAGNOSIS — N183 Chronic kidney disease, stage 3 (moderate): Secondary | ICD-10-CM | POA: Diagnosis not present

## 2019-03-23 DIAGNOSIS — D509 Iron deficiency anemia, unspecified: Secondary | ICD-10-CM

## 2019-03-23 DIAGNOSIS — I129 Hypertensive chronic kidney disease with stage 1 through stage 4 chronic kidney disease, or unspecified chronic kidney disease: Secondary | ICD-10-CM | POA: Insufficient documentation

## 2019-03-23 DIAGNOSIS — D631 Anemia in chronic kidney disease: Secondary | ICD-10-CM | POA: Diagnosis not present

## 2019-03-23 LAB — CBC WITH DIFFERENTIAL/PLATELET
Abs Immature Granulocytes: 0.03 10*3/uL (ref 0.00–0.07)
Basophils Absolute: 0.1 10*3/uL (ref 0.0–0.1)
Basophils Relative: 1 %
Eosinophils Absolute: 0.2 10*3/uL (ref 0.0–0.5)
Eosinophils Relative: 2 %
HCT: 38.1 % (ref 36.0–46.0)
Hemoglobin: 11.7 g/dL — ABNORMAL LOW (ref 12.0–15.0)
Immature Granulocytes: 0 %
Lymphocytes Relative: 17 %
Lymphs Abs: 1.6 10*3/uL (ref 0.7–4.0)
MCH: 26.8 pg (ref 26.0–34.0)
MCHC: 30.7 g/dL (ref 30.0–36.0)
MCV: 87.4 fL (ref 80.0–100.0)
Monocytes Absolute: 0.6 10*3/uL (ref 0.1–1.0)
Monocytes Relative: 7 %
Neutro Abs: 6.9 10*3/uL (ref 1.7–7.7)
Neutrophils Relative %: 73 %
Platelets: 175 10*3/uL (ref 150–400)
RBC: 4.36 MIL/uL (ref 3.87–5.11)
RDW: 14.6 % (ref 11.5–15.5)
WBC: 9.4 10*3/uL (ref 4.0–10.5)
nRBC: 0 % (ref 0.0–0.2)

## 2019-03-23 LAB — FERRITIN: Ferritin: 129 ng/mL (ref 11–307)

## 2019-04-27 ENCOUNTER — Ambulatory Visit (INDEPENDENT_AMBULATORY_CARE_PROVIDER_SITE_OTHER): Payer: Medicare Other | Admitting: Gastroenterology

## 2019-04-27 ENCOUNTER — Encounter: Payer: Self-pay | Admitting: Gastroenterology

## 2019-04-27 ENCOUNTER — Other Ambulatory Visit: Payer: Self-pay

## 2019-04-27 VITALS — BP 160/78 | HR 82 | Temp 98.5°F | Resp 17 | Ht 61.0 in | Wt 155.0 lb

## 2019-04-27 DIAGNOSIS — K2940 Chronic atrophic gastritis without bleeding: Secondary | ICD-10-CM

## 2019-04-27 DIAGNOSIS — D508 Other iron deficiency anemias: Secondary | ICD-10-CM

## 2019-04-27 NOTE — Progress Notes (Signed)
Madison Darby, MD [address]  [place]  [place], [place] [address]  Main: [phone]  Fax: [phone]    Gastroenterology Consultation  Referring Provider:     Venita Lick, NP Primary Care Physician:  Madison Lick, NP Primary Gastroenterologist:  Dr. Lucilla Franklin Reason for Consultation:     Iron deficiency anemia, atrophic gastritis        HPI:   Madison Franklin is a 73 y.o. female referred by Dr. Venita Lick, NP  for consultation & management of chronic iron deficiency and B12 deficiency anemia.  Initially saw this patient for surveillance colonoscopy.  From her labs, she was found to have iron deficiency, therefore I performed upper endoscopy along with a colonoscopy.  Gastric biopsies came back as atrophic gastritis with intestinal metaplasia and negative for H. pylori.  There was no evidence of dysplasia.  Currently, she is taking oral iron 1 pill daily along with B12.  Her anemia significantly improved with oral iron and B12 replacement.  She denies any GI symptoms. She quit smoking 1 year ago, she denies black stools, rectal bleeding  Patient was seen by Dr. Dahlia Franklin for porcelain gallbladder and deferred cholecystectomy due to low risk for malignancy  Patient is accompanied by her daughter today.  Follow-up visit 04/27/2019 Patient reports doing well.  Her hemoglobin is improving.  Her iron levels are back to normal.  She is on oral iron daily.  She denies any GI symptoms today  NSAIDs: None  Antiplts/Anticoagulants/Anti thrombotics: None  GI Procedures:  EGD and colonoscopy 11/10/2018 - Normal duodenal bulb and second portion of the duodenum. - Non-bleeding erosive gastropathy. Biopsied. - Normal gastric body. Biopsied. - Esophagogastric landmarks identified. - A single non-bleeding erosion in the upper third of the esophagus. - Normal gastroesophageal junction.  - Three 5 to 6 mm polyps in the ascending colon, removed with a cold snare.  Resected and retrieved. Clip (MR conditional) was placed. - One 8 mm polyp in the transverse colon, removed with a hot snare. Resected and retrieved. - One 5 mm polyp in the transverse colon, removed with a cold snare. Resected and retrieved. - One 5 mm polyp in the sigmoid colon, removed with a cold snare. Resected and retrieved. - One 7 mm polyp at the recto-sigmoid colon, removed with a hot snare. Resected and retrieved. - Non-bleeding external and internal hemorrhoids.    Colonoscopy 11/14/2016  Past Medical History:  Diagnosis Date  . COPD (chronic obstructive pulmonary disease) ([place])   . Dyspnea   . Hearing aid worn    bilateral  . Hyperlipidemia   . Hypertension   . Osteoporosis   . Personal history of tobacco use, presenting hazards to health 08/15/2015  . Pulmonary hypertension, mild ([facility])    mild to moderate    Past Surgical History:  Procedure Laterality Date  . ABDOMINAL HYSTERECTOMY    . COLONOSCOPY WITH PROPOFOL N/A 11/14/2016   Procedure: COLONOSCOPY WITH PROPOFOL;  Surgeon: [place] Lame, MD;  Location: [place];  Service: Endoscopy;  Laterality: N/A;  . COLONOSCOPY WITH PROPOFOL N/A 11/10/2018   Procedure: COLONOSCOPY WITH PROPOFOL;  Surgeon: [place] Landsman, MD;  Location: [place];  Service: Endoscopy;  Laterality: N/A;  . ESOPHAGOGASTRODUODENOSCOPY (EGD) WITH PROPOFOL  11/10/2018   Procedure: ESOPHAGOGASTRODUODENOSCOPY (EGD) WITH PROPOFOL;  Surgeon: [place] Landsman, MD;  Location: [place];  Service: Endoscopy;;  . long nodule    . POLYPECTOMY  11/14/2016   Procedure:  POLYPECTOMY;  Surgeon: [place] Lame, MD;  Location: [place];  Service: Endoscopy;;  . POLYPECTOMY  11/10/2018   Procedure: POLYPECTOMY;  Surgeon: [place] Landsman, MD;  Location: [place];  Service: Endoscopy;;  . RIGHT HEART CATH Right 07/14/2017   Procedure: RIGHT HEART CATH;  Surgeon: Nelva Bush, MD;  Location: [place];  Service: Cardiovascular;  Laterality: Right;    Current Outpatient Medications:  .  acetaminophen (TYLENOL) 325 MG tablet, Take 325 mg by mouth every 6 (six) hours as needed (for pain.)., Disp: , Rfl:  .  albuterol (PROVENTIL HFA;VENTOLIN HFA) 108 (90 Base) MCG/ACT inhaler, Inhale 2 puffs into the lungs every 6 (six) hours as needed for wheezing or shortness of breath., Disp: 1 Inhaler, Rfl: 2 .  amLODipine (NORVASC) 5 MG tablet, Take 1 tablet (5 mg total) by mouth daily., Disp: 90 tablet, Rfl: 3 .  aspirin EC 81 MG tablet, Take 81 mg by mouth daily., Disp: , Rfl:  .  atorvastatin (LIPITOR) 10 MG tablet, Take 1 tablet (10 mg total) by mouth daily., Disp: 90 tablet, Rfl: 3 .  ferrous sulfate 325 (65 FE) MG tablet, Take 65 mg by mouth daily with breakfast., Disp: , Rfl:  .  hydrochlorothiazide (MICROZIDE) 12.5 MG capsule, Take 1 capsule (12.5 mg total) by mouth daily., Disp: 90 capsule, Rfl: 3 .  lisinopril (PRINIVIL,ZESTRIL) 40 MG tablet, Take 1 tablet (40 mg total) by mouth daily., Disp: 90 tablet, Rfl: 3 .  vitamin B-12 (CYANOCOBALAMIN) 1000 MCG tablet, Take 1,000 mcg by mouth daily., Disp: , Rfl:  .  Vitamin D, Cholecalciferol, 1000 units TABS, Take 1 tablet by mouth daily., Disp: , Rfl:     Family History  Problem Relation Age of Onset  . Heart disease Neg Hx      Social History   Tobacco Use  . Smoking status: Former Smoker    Packs/day: 0.50    Years: 30.00    Pack years: 15.00    Types: Cigarettes    Quit date: 01/17/2017    Years since quitting: 2.2  . Smokeless tobacco: Never Used  Substance Use Topics  . Alcohol use: No    Alcohol/week: 0.0 standard drinks  . Drug use: No    Allergies as of 04/27/2019 - Review Complete 04/27/2019  Allergen Reaction Noted  . Latex Itching and Rash 07/13/2017    Review of Systems:    All systems reviewed and negative except where noted in HPI.   Physical Exam:  BP (!) 160/78 (BP Location: Left Arm, Patient Position:  Sitting, Cuff Size: Normal)   Pulse 82   Temp 98.5 F (36.9 C)   Resp 17   Ht 5\' 1"  (1.549 m)   Wt 155 lb (70.3 kg)   LMP  (LMP Unknown)   BMI 29.29 kg/m  No LMP recorded (lmp unknown). Patient has had a hysterectomy.  General:   Alert,  Well-developed, well-nourished, pleasant and cooperative in NAD Head:  Normocephalic and atraumatic. Eyes:  Sclera clear, no icterus.   Conjunctiva pink. Ears:  Normal auditory acuity. Nose:  No deformity, discharge, or lesions. Mouth:  No deformity or lesions,oropharynx pink & moist. Neck:  Supple; no masses or thyromegaly. Lungs:  Respirations even and unlabored.  Clear throughout to auscultation.   No wheezes, crackles, or rhonchi. No acute distress. Heart:  Regular rate and rhythm; no murmurs, clicks, rubs, or gallops. Abdomen:  Normal bowel sounds. Soft, obese, non-tender, without masses, hepatosplenomegaly or hernias noted.  No guarding or rebound tenderness.   Rectal: Not performed Msk:  Symmetrical without gross deformities. Good, equal movement & strength bilaterally. Pulses:  Normal pulses noted. Extremities:  No clubbing or edema.  No cyanosis. Neurologic:  Alert and oriented x3;  grossly normal neurologically. Skin:  Intact without significant lesions or rashes. No jaundice. Psych:  Alert and cooperative. Normal mood and affect.  Imaging Studies: Reviewed  Assessment and Plan:   Madison Franklin is a 73 y.o. female with history of COPD, chronic iron and B12 deficiency anemia seen as a follow-up of endoscopy and colonoscopy results.  Iron and B12 deficiency anemia -resolved EGD revealed atrophic gastritis with intestinal metaplasia, no evidence of H. pylori, no evidence of dysplasia antiparietal cell and anti-intrinsic factor antibodies were negative Discussed with patient and her husband about repeat EGD with gastric mapping, she is agreeable She can stop oral iron   Multiple tubular adenomas of colon, > 10 cumulative Recommend  seeing a genetic counselor, referral placed Recommend surveillance colonoscopy in 3 years   Follow up in 3 months   Madison Darby, MD

## 2019-05-04 ENCOUNTER — Other Ambulatory Visit: Payer: Self-pay

## 2019-05-04 ENCOUNTER — Encounter: Payer: Self-pay | Admitting: *Deleted

## 2019-05-06 ENCOUNTER — Other Ambulatory Visit: Admission: RE | Admit: 2019-05-06 | Payer: Medicare Other | Source: Ambulatory Visit

## 2019-05-09 ENCOUNTER — Other Ambulatory Visit
Admission: RE | Admit: 2019-05-09 | Discharge: 2019-05-09 | Disposition: A | Discharge status: Home / Self Care | Payer: Medicare Other | Source: Ambulatory Visit | Attending: Gastroenterology | Admitting: Gastroenterology

## 2019-05-09 ENCOUNTER — Other Ambulatory Visit: Payer: Self-pay

## 2019-05-09 DIAGNOSIS — Z1159 Encounter for screening for other viral diseases: Secondary | ICD-10-CM | POA: Insufficient documentation

## 2019-05-09 NOTE — Anesthesia Preprocedure Evaluation (Addendum)
Anesthesia Evaluation  Patient identified by MRN, date of birth, ID band Patient awake    Reviewed: Allergy & Precautions, NPO status , Patient's Chart, lab work & pertinent test results  History of Anesthesia Complications Negative for: history of anesthetic complications  Airway Mallampati: IV       Dental  (+) Edentulous Upper, Poor Dentition, Chipped   Pulmonary sleep apnea , COPD, former smoker,    + rhonchi        Cardiovascular hypertension, + Valvular Problems/Murmurs (pulmonary HTN)  Rhythm:Regular Rate:Normal + Systolic murmurs    Neuro/Psych HOH    GI/Hepatic negative GI ROS,   Endo/Other  negative endocrine ROS  Renal/GU Renal disease (stage III CKD)     Musculoskeletal   Abdominal   Peds  Hematology   Anesthesia Other Findings   Reproductive/Obstetrics                             Anesthesia Physical  Anesthesia Plan  ASA: III  Anesthesia Plan: General   Post-op Pain Management:    Induction: Intravenous  PONV Risk Score and Plan: 2 and TIVA and Propofol infusion  Airway Management Planned: Natural Airway  Additional Equipment:   Intra-op Plan:   Post-operative Plan:   Informed Consent: I have reviewed the patients History and Physical, chart, labs and discussed the procedure including the risks, benefits and alternatives for the proposed anesthesia with the patient or authorized representative who has indicated his/her understanding and acceptance.       Plan Discussed with: CRNA  Anesthesia Plan Comments:         Anesthesia Quick Evaluation

## 2019-05-10 LAB — SARS CORONAVIRUS 2 (TAT 6-24 HRS): SARS Coronavirus 2: NEGATIVE

## 2019-05-10 NOTE — Discharge Instructions (Signed)

## 2019-05-11 ENCOUNTER — Other Ambulatory Visit: Payer: Self-pay

## 2019-05-11 ENCOUNTER — Ambulatory Visit: Payer: Medicare Other | Admitting: Anesthesiology

## 2019-05-11 ENCOUNTER — Encounter
Admission: RE | Disposition: A | Discharge status: Home / Self Care | Payer: Self-pay | Source: Home / Self Care | Attending: Gastroenterology

## 2019-05-11 ENCOUNTER — Ambulatory Visit
Admission: RE | Admit: 2019-05-11 | Discharge: 2019-05-11 | Disposition: A | Discharge status: Home / Self Care | Payer: Medicare Other | Attending: Gastroenterology | Admitting: Gastroenterology

## 2019-05-11 DIAGNOSIS — N183 Chronic kidney disease, stage 3 (moderate): Secondary | ICD-10-CM | POA: Insufficient documentation

## 2019-05-11 DIAGNOSIS — K3189 Other diseases of stomach and duodenum: Secondary | ICD-10-CM | POA: Insufficient documentation

## 2019-05-11 DIAGNOSIS — D510 Vitamin B12 deficiency anemia due to intrinsic factor deficiency: Secondary | ICD-10-CM | POA: Diagnosis not present

## 2019-05-11 DIAGNOSIS — G4730 Sleep apnea, unspecified: Secondary | ICD-10-CM | POA: Insufficient documentation

## 2019-05-11 DIAGNOSIS — I10 Essential (primary) hypertension: Secondary | ICD-10-CM | POA: Insufficient documentation

## 2019-05-11 DIAGNOSIS — K31A0 Gastric intestinal metaplasia, unspecified: Secondary | ICD-10-CM

## 2019-05-11 DIAGNOSIS — E785 Hyperlipidemia, unspecified: Secondary | ICD-10-CM | POA: Diagnosis not present

## 2019-05-11 DIAGNOSIS — Z87891 Personal history of nicotine dependence: Secondary | ICD-10-CM | POA: Diagnosis not present

## 2019-05-11 DIAGNOSIS — K2940 Chronic atrophic gastritis without bleeding: Secondary | ICD-10-CM | POA: Diagnosis not present

## 2019-05-11 DIAGNOSIS — J449 Chronic obstructive pulmonary disease, unspecified: Secondary | ICD-10-CM | POA: Insufficient documentation

## 2019-05-11 DIAGNOSIS — I272 Pulmonary hypertension, unspecified: Secondary | ICD-10-CM | POA: Diagnosis not present

## 2019-05-11 DIAGNOSIS — K2930 Chronic superficial gastritis without bleeding: Secondary | ICD-10-CM | POA: Diagnosis not present

## 2019-05-11 DIAGNOSIS — Z79899 Other long term (current) drug therapy: Secondary | ICD-10-CM | POA: Insufficient documentation

## 2019-05-11 DIAGNOSIS — Z7982 Long term (current) use of aspirin: Secondary | ICD-10-CM | POA: Diagnosis not present

## 2019-05-11 HISTORY — PX: ESOPHAGOGASTRODUODENOSCOPY (EGD) WITH PROPOFOL: SHX5813

## 2019-05-11 SURGERY — ESOPHAGOGASTRODUODENOSCOPY (EGD) WITH PROPOFOL
Anesthesia: General

## 2019-05-11 MED ORDER — ACETAMINOPHEN 325 MG PO TABS: 650.0000 mg | ORAL_TABLET | Freq: Once | ORAL | Status: DC | PRN

## 2019-05-11 MED ORDER — LIDOCAINE HCL (CARDIAC) PF 100 MG/5ML IV SOSY
PREFILLED_SYRINGE | INTRAVENOUS | Status: DC | PRN
  Administered 2019-05-11: 40 mg via INTRAVENOUS

## 2019-05-11 MED ORDER — PROPOFOL 10 MG/ML IV BOLUS
INTRAVENOUS | Status: DC | PRN
  Administered 2019-05-11 (×2): 20 mg via INTRAVENOUS
  Administered 2019-05-11: 40 mg via INTRAVENOUS
  Administered 2019-05-11: 80 mg via INTRAVENOUS

## 2019-05-11 MED ORDER — ONDANSETRON HCL 4 MG/2ML IJ SOLN: 4.0000 mg | Freq: Once | INTRAMUSCULAR | Status: DC | PRN

## 2019-05-11 MED ORDER — LACTATED RINGERS IV SOLN
INTRAVENOUS | Status: DC
  Administered 2019-05-11: 10:00:00 via INTRAVENOUS

## 2019-05-11 MED ORDER — PHENYLEPHRINE HCL (PRESSORS) 10 MG/ML IV SOLN
INTRAVENOUS | Status: DC | PRN
  Administered 2019-05-11: 100 ug via INTRAVENOUS
  Administered 2019-05-11: 50 ug via INTRAVENOUS

## 2019-05-11 MED ORDER — STERILE WATER FOR IRRIGATION IR SOLN
Status: DC | PRN
  Administered 2019-05-11: 15 mL

## 2019-05-11 MED ORDER — ACETAMINOPHEN 160 MG/5ML PO SOLN: 325.0000 mg | ORAL | Status: DC | PRN

## 2019-05-11 SURGICAL SUPPLY — 33 items
BALLN DILATOR 10-12 8 (BALLOONS)
BALLN DILATOR 12-15 8 (BALLOONS)
BALLN DILATOR 15-18 8 (BALLOONS)
BALLN DILATOR CRE 0-12 8 (BALLOONS)
BALLN DILATOR ESOPH 8 10 CRE (MISCELLANEOUS) IMPLANT
BALLOON DILATOR 12-15 8 (BALLOONS) IMPLANT
BALLOON DILATOR 15-18 8 (BALLOONS) IMPLANT
BALLOON DILATOR CRE 0-12 8 (BALLOONS) IMPLANT
BLOCK BITE 60FR ADLT L/F GRN (MISCELLANEOUS) ×3 IMPLANT
CANISTER SUCT 1200ML W/VALVE (MISCELLANEOUS) ×3 IMPLANT
CLIP HMST 235XBRD CATH ROT (MISCELLANEOUS) IMPLANT
CLIP RESOLUTION 360 11X235 (MISCELLANEOUS)
ELECT REM PT RETURN 9FT ADLT (ELECTROSURGICAL)
ELECTRODE REM PT RTRN 9FT ADLT (ELECTROSURGICAL) IMPLANT
FCP ESCP3.2XJMB 240X2.8X (MISCELLANEOUS) ×1
FORCEPS BIOP RAD 4 LRG CAP 4 (CUTTING FORCEPS) IMPLANT
FORCEPS BIOP RJ4 240 W/NDL (MISCELLANEOUS) ×2
FORCEPS ESCP3.2XJMB 240X2.8X (MISCELLANEOUS) IMPLANT
GOWN CVR UNV OPN BCK APRN NK (MISCELLANEOUS) ×2 IMPLANT
GOWN ISOL THUMB LOOP REG UNIV (MISCELLANEOUS) ×4
INJECTOR VARIJECT VIN23 (MISCELLANEOUS) IMPLANT
KIT DEFENDO VALVE AND CONN (KITS) IMPLANT
KIT ENDO PROCEDURE OLY (KITS) ×3 IMPLANT
MARKER SPOT ENDO TATTOO 5ML (MISCELLANEOUS) IMPLANT
RETRIEVER NET PLAT FOOD (MISCELLANEOUS) IMPLANT
SNARE SHORT THROW 13M SML OVAL (MISCELLANEOUS) IMPLANT
SNARE SHORT THROW 30M LRG OVAL (MISCELLANEOUS) IMPLANT
SPOT EX ENDOSCOPIC TATTOO (MISCELLANEOUS)
SYR INFLATION 60ML (SYRINGE) IMPLANT
TRAP ETRAP POLY (MISCELLANEOUS) IMPLANT
VARIJECT INJECTOR VIN23 (MISCELLANEOUS)
WATER STERILE IRR 250ML POUR (IV SOLUTION) ×3 IMPLANT
WIRE CRE 18-20MM 8CM F G (MISCELLANEOUS) IMPLANT

## 2019-05-11 NOTE — Op Note (Signed)
[facility] Patient Name: Madison Franklin Procedure Date: 05/11/2019 11:19 AM MRN: [id number] Account #: [id number] Date of Birth: May 07, 1946 Admit Type: Outpatient Age: 73 Room: [facility] OR ROOM 01 Gender: Female Note Status: Finalized Procedure:            Upper GI endoscopy Indications:          Surveillance procedure, Pernicious anemia Providers:            Lin Landsman MD, MD Medicines:            Monitored Anesthesia Care Complications:        No immediate complications. Estimated blood loss: None. Procedure:            Pre-Anesthesia Assessment:                       - Prior to the procedure, a History and Physical was                        performed, and patient medications and allergies were                        reviewed. The patient is competent. The risks and                        benefits of the procedure and the sedation options and                        risks were discussed with the patient. All questions                        were answered and informed consent was obtained.                        Patient identification and proposed procedure were                        verified by the physician, the nurse, the                        anesthesiologist, the anesthetist and the technician in                        the pre-procedure area in the procedure room in the                        endoscopy suite. Mental Status Examination: alert and                        oriented. Airway Examination: normal oropharyngeal                        airway and neck mobility. Respiratory Examination:                        clear to auscultation. CV Examination: normal.                        Prophylactic Antibiotics: The patient does not require  prophylactic antibiotics. Prior Anticoagulants: The                        patient has taken no previous anticoagulant or                        antiplatelet agents. ASA Grade  Assessment: III - A                        patient with severe systemic disease. After reviewing                        the risks and benefits, the patient was deemed in                        satisfactory condition to undergo the procedure. The                        anesthesia plan was to use monitored anesthesia care                        (MAC). Immediately prior to administration of                        medications, the patient was re-assessed for adequacy                        to receive sedatives. The heart rate, respiratory rate,                        oxygen saturations, blood pressure, adequacy of                        pulmonary ventilation, and response to care were                        monitored throughout the procedure. The physical status                        of the patient was re-assessed after the procedure.                       After obtaining informed consent, the endoscope was                        passed under direct vision. Throughout the procedure,                        the patient's blood pressure, pulse, and oxygen                        saturations were monitored continuously. The was                        introduced through the mouth, and advanced to the                        second part of duodenum. The upper GI endoscopy was  accomplished without difficulty. The patient tolerated                        the procedure well. Findings:      The duodenal bulb and second portion of the duodenum were normal.      Multiple dispersed, diminutive non-bleeding erosions were found at the       incisura. There were no stigmata of recent bleeding.      Diffuse atrophic mucosa was found in the entire examined stomach.       Biopsies were taken with a cold forceps for histology.      Esophagogastric landmarks were identified: the gastroesophageal junction       was found at 35 cm from the incisors.      The gastroesophageal junction and  examined esophagus were normal. Impression:           - Normal duodenal bulb and second portion of the                        duodenum.                       - Non-bleeding erosive gastropathy.                       - Gastric mucosal atrophy. Biopsied.                       - Esophagogastric landmarks identified.                       - Normal gastroesophageal junction and esophagus. Recommendation:       - Await pathology results.                       - Discharge patient to home (with spouse).                       - Resume previous diet today.                       - Continue present medications. Procedure Code(s):    --- Professional ---                       [phone], Esophagogastroduodenoscopy, flexible, transoral;                        with biopsy, single or multiple Diagnosis Code(s):    --- Professional ---                       K31.89, Other diseases of stomach and duodenum                       D51.0, Vitamin B12 deficiency anemia due to intrinsic                        factor deficiency CPT copyright 2019 [employer]. All rights reserved. The codes documented in this report are preliminary and upon coder review may  be revised to meet current compliance requirements. Dr. Ulyess Mort Lin Landsman MD, MD 05/11/2019 11:43:23 AM This report has been signed electronically. Number of Addenda: 0 Note Initiated On: 05/11/2019  11:19 AM Total Procedure Duration: 0 hours 7 minutes 38 seconds  Estimated Blood Loss: Estimated blood loss: none.      [facility]

## 2019-05-11 NOTE — H&P (Signed)
Cephas Darby, MD [address]  [place]  [place], [place] [address]  Main: [phone]  Fax: [phone] Pager: [phone]  Primary Care Physician:  Venita Lick, NP Primary Gastroenterologist:  Dr. Cephas Darby  Pre-Procedure History & Physical: HPI:  Madison Franklin is a 73 y.o. female is here for an endoscopy.   Past Medical History:  Diagnosis Date  . COPD (chronic obstructive pulmonary disease) ([facility])   . Dyspnea   . Hearing aid worn    bilateral  . Hyperlipidemia   . Hypertension   . Osteoporosis   . Personal history of tobacco use, presenting hazards to health 08/15/2015  . Pulmonary hypertension, mild (HCC)    mild to moderate    Past Surgical History:  Procedure Laterality Date  . ABDOMINAL HYSTERECTOMY    . COLONOSCOPY WITH PROPOFOL N/A 11/14/2016   Procedure: COLONOSCOPY WITH PROPOFOL;  Surgeon: [name], MD;  Location: [facility];  Service: Endoscopy;  Laterality: N/A;  . COLONOSCOPY WITH PROPOFOL N/A 11/10/2018   Procedure: COLONOSCOPY WITH PROPOFOL;  Surgeon: Lin Landsman, MD;  Location: [facility];  Service: Endoscopy;  Laterality: N/A;  . ESOPHAGOGASTRODUODENOSCOPY (EGD) WITH PROPOFOL  11/10/2018   Procedure: ESOPHAGOGASTRODUODENOSCOPY (EGD) WITH PROPOFOL;  Surgeon: Lin Landsman, MD;  Location: [facility];  Service: Endoscopy;;  . long nodule    . POLYPECTOMY  11/14/2016   Procedure: POLYPECTOMY;  Surgeon: [name], MD;  Location: [facility];  Service: Endoscopy;;  . POLYPECTOMY  11/10/2018   Procedure: POLYPECTOMY;  Surgeon: Lin Landsman, MD;  Location: [facility];  Service: Endoscopy;;  . RIGHT HEART CATH Right 07/14/2017   Procedure: RIGHT HEART CATH;  Surgeon: Nelva Bush, MD;  Location: [facility];  Service: Cardiovascular;  Laterality: Right;    Prior to Admission medications   Medication Sig Start Date End Date Taking? Authorizing Provider   acetaminophen (TYLENOL) 325 MG tablet Take 325 mg by mouth every 6 (six) hours as needed (for pain.).   Yes [provider]  albuterol (PROVENTIL HFA;VENTOLIN HFA) 108 (90 Base) MCG/ACT inhaler Inhale 2 puffs into the lungs every 6 (six) hours as needed for wheezing or shortness of breath. 11/24/18  Yes Cannady, Jolene T, NP  amLODipine (NORVASC) 5 MG tablet Take 1 tablet (5 mg total) by mouth daily. 08/19/18  Yes Cannady, Henrine Screws T, NP  aspirin EC 81 MG tablet Take 81 mg by mouth daily.   Yes [provider]  atorvastatin (LIPITOR) 10 MG tablet Take 1 tablet (10 mg total) by mouth daily. 08/19/18  Yes Cannady, Jolene T, NP  ferrous sulfate 325 (65 FE) MG tablet Take 65 mg by mouth daily with breakfast.   Yes [provider]  hydrochlorothiazide (MICROZIDE) 12.5 MG capsule Take 1 capsule (12.5 mg total) by mouth daily. 08/19/18  Yes Cannady, Jolene T, NP  lisinopril (PRINIVIL,ZESTRIL) 40 MG tablet Take 1 tablet (40 mg total) by mouth daily. 08/19/18  Yes Cannady, Jolene T, NP  vitamin B-12 (CYANOCOBALAMIN) 1000 MCG tablet Take 1,000 mcg by mouth daily.   Yes [provider]  Vitamin D, Cholecalciferol, 1000 units TABS Take 1 tablet by mouth daily.   Yes [provider]    Allergies as of 04/27/2019 - Review Complete 04/27/2019  Allergen Reaction Noted  . Latex Itching and Rash 07/13/2017    Family History  Problem Relation Age of Onset  . Heart disease Neg Hx     Social History  Socioeconomic History  . Marital status: Married    Spouse name: Not on file  . Number of children: Not on file  . Years of education: 47  . Highest education level: 12th grade  Occupational History  . Not on file  Social Needs  . Financial resource strain: Hard  . Food insecurity    Worry: Never true    Inability: Never true  . Transportation needs    Medical: No    Non-medical: No  Tobacco Use  . Smoking status: Former Smoker    Packs/day: 0.50    Years:  30.00    Pack years: 15.00    Types: Cigarettes    Quit date: 01/17/2017    Years since quitting: 2.3  . Smokeless tobacco: Never Used  Substance and Sexual Activity  . Alcohol use: No    Alcohol/week: 0.0 standard drinks  . Drug use: No  . Sexual activity: Yes  Lifestyle  . Physical activity    Days per week: 0 days    Minutes per session: 0 min  . Stress: Not at all  Relationships  . Social Herbalist on phone: Never    Gets together: Once a week    Attends religious service: More than 4 times per year    Active member of club or organization: No    Attends meetings of clubs or organizations: Never    Relationship status: Married  . Intimate partner violence    Fear of current or ex partner: No    Emotionally abused: No    Physically abused: No    Forced sexual activity: No  Other Topics Concern  . Not on file  Social History Narrative  . Not on file    Review of Systems: See HPI, otherwise negative ROS  Physical Exam: BP 128/66   Pulse 83   Temp 97.8 F (36.6 C) (Temporal)   Resp 18   Ht 5\' 1"  (1.549 m)   Wt 70.3 kg   LMP  (LMP Unknown)   SpO2 98%   BMI 29.28 kg/m  General:   Alert,  pleasant and cooperative in NAD Head:  Normocephalic and atraumatic. Neck:  Supple; no masses or thyromegaly. Lungs:  Clear throughout to auscultation.    Heart:  Regular rate and rhythm. Abdomen:  Soft, nontender and nondistended. Normal bowel sounds, without guarding, and without rebound.   Neurologic:  Alert and  oriented x4;  grossly normal neurologically.  Impression/Plan: Madison Franklin is here for an endoscopy to be performed for gastric mapping, h/o atrophic gastritis with intestinal metaplasia  Risks, benefits, limitations, and alternatives regarding  endoscopy have been reviewed with the patient.  Questions have been answered.  All parties agreeable.   Sherri Sear, MD  05/11/2019, 10:06 AM

## 2019-05-11 NOTE — Transfer of Care (Signed)
Immediate Anesthesia Transfer of Care Note  Patient: Madison Franklin  Procedure(s) Performed: ESOPHAGOGASTRODUODENOSCOPY (EGD) WITH PROPOFOL (N/A )  Patient Location: PACU  Anesthesia Type: General  Level of Consciousness: awake, alert  and patient cooperative  Airway and Oxygen Therapy: Patient Spontanous Breathing and Patient connected to supplemental oxygen  Post-op Assessment: Post-op Vital signs reviewed, Patient's Cardiovascular Status Stable, Respiratory Function Stable, Patent Airway and No signs of Nausea or vomiting  Post-op Vital Signs: Reviewed and stable  Complications: No apparent anesthesia complications

## 2019-05-11 NOTE — Anesthesia Procedure Notes (Signed)
Date/Time: 05/11/2019 11:28 AM Performed by: Cameron Ali, CRNA Pre-anesthesia Checklist: Patient identified, Emergency Drugs available, Suction available, Timeout performed and Patient being monitored Patient Re-evaluated:Patient Re-evaluated prior to induction Oxygen Delivery Method: Nasal cannula Placement Confirmation: positive ETCO2

## 2019-05-11 NOTE — Anesthesia Postprocedure Evaluation (Signed)
Anesthesia Post Note  Patient: Madison Franklin  Procedure(s) Performed: ESOPHAGOGASTRODUODENOSCOPY (EGD) WITH PROPOFOL (N/A )  Patient location during evaluation: PACU Anesthesia Type: General Level of consciousness: awake and alert, oriented and patient cooperative Pain management: pain level controlled Vital Signs Assessment: post-procedure vital signs reviewed and stable Respiratory status: spontaneous breathing, nonlabored ventilation and respiratory function stable Cardiovascular status: blood pressure returned to baseline and stable Postop Assessment: adequate PO intake Anesthetic complications: no    Darrin Nipper

## 2019-05-12 ENCOUNTER — Encounter: Payer: Self-pay | Admitting: Gastroenterology

## 2019-05-16 ENCOUNTER — Encounter: Payer: Self-pay | Admitting: Gastroenterology

## 2019-05-23 NOTE — Progress Notes (Signed)
[facility]  [address], [address] [place], [place] [address] Phone: [phone]  Fax: [phone]   Clinic Day:  05/25/2019  Referring physician: Venita Lick, NP  Chief Complaint: Madison Franklin is a 73 y.o. female with anemia of chronic kidney disease, iron deficiency, and B12 deficiency who is seen for 6 month assessment  HPI: The patient was last seen in the hematology clinic on 11/24/2018. At that time, she felt "alright".  Exam revealed a blottable gallbladder.  Hemoglobin was 11.1.  Ferritin was 46.  She was seen by Caroleen Hamman, MD in general surgery for consultation about porcelain gallbladder. Recommendation was to avoid surgery if possible due to comorbidities and follow-up with an ultrasound in 6 months.   She was seen in gastroenterology on 12/15/2018 by Dr. Marius Ditch. She discontinued oral B-12 as her level was >1000. She continued oral iron once daily. Surveillance colonoscopy was recommended in 3 years. She was referred to genetics for multiple tubular adenomas of the colon. She was seen again on 04/27/2019. Antiparietal cell and anti-intrinsic factor antibodies were negative. Oral iron was discontinued. EGD was scheduled.   Labs on 03/23/2019: hematocrit 38.1, hemoglobin 11.7, MCV 87.4, platelets 175,000, WBC 9,400. Ferritin 129.   EGD on 05/11/2019 revealed normal duodenal bulb and second portion of the duodenum. There was non-bleeding erosive gastropathy and gastric mucosal atrophy.  There was a normal gastroesophageal junction and esophagus. Pathology revealed chronic inactive atrophic gastritis with intestinal metaplasia, negative for H pylori.   During the interim, she is doing "alright." She denies any abdominal pain. She continues to have shortness of breath with exertion. She denies any other complaints.    Past Medical History:  Diagnosis Date   COPD (chronic obstructive pulmonary disease) ([facility])    Dyspnea    Hearing aid worn    bilateral   Hyperlipidemia    Hypertension    Osteoporosis    Personal history of tobacco use, presenting hazards to health 08/15/2015   Pulmonary hypertension, mild (HCC)    mild to moderate    Past Surgical History:  Procedure Laterality Date   ABDOMINAL HYSTERECTOMY     COLONOSCOPY WITH PROPOFOL N/A 11/14/2016   Procedure: COLONOSCOPY WITH PROPOFOL;  Surgeon: [name], MD;  Location: [facility];  Service: Endoscopy;  Laterality: N/A;   COLONOSCOPY WITH PROPOFOL N/A 11/10/2018   Procedure: COLONOSCOPY WITH PROPOFOL;  Surgeon: Lin Landsman, MD;  Location: [facility];  Service: Endoscopy;  Laterality: N/A;   ESOPHAGOGASTRODUODENOSCOPY (EGD) WITH PROPOFOL  11/10/2018   Procedure: ESOPHAGOGASTRODUODENOSCOPY (EGD) WITH PROPOFOL;  Surgeon: Lin Landsman, MD;  Location: [facility];  Service: Endoscopy;;   ESOPHAGOGASTRODUODENOSCOPY (EGD) WITH PROPOFOL N/A 05/11/2019   Procedure: ESOPHAGOGASTRODUODENOSCOPY (EGD) WITH PROPOFOL;  Surgeon: Lin Landsman, MD;  Location: [facility];  Service: Endoscopy;  Laterality: N/A;   long nodule     POLYPECTOMY  11/14/2016   Procedure: POLYPECTOMY;  Surgeon: [name], MD;  Location: [facility];  Service: Endoscopy;;   POLYPECTOMY  11/10/2018   Procedure: POLYPECTOMY;  Surgeon: Lin Landsman, MD;  Location: [facility];  Service: Endoscopy;;   RIGHT HEART CATH Right 07/14/2017   Procedure: RIGHT HEART CATH;  Surgeon: Nelva Bush, MD;  Location: [facility];  Service: Cardiovascular;  Laterality: Right;    Family History  Problem Relation Age of Onset   Heart disease Neg Hx     Social History:  reports that she quit smoking about  2 years ago. Her smoking use included cigarettes. She has a 15.00 pack-year smoking history. She has never used smokeless tobacco. She reports that she does not drink alcohol or use drugs. She smoked 1/2 pack/day x 30 years.  She quit in 2018. She is a retired Secretary/administrator. Her husband's name is Elenore Rota. Her daughter Levada Dy can be reached over the phone [phone]). The patient is alone in the clinic today.   Allergies:  Allergies  Allergen Reactions   Latex Itching and Rash    With latex gloves (when worn)    Current Medications: Current Outpatient Medications  Medication Sig Dispense Refill   acetaminophen (TYLENOL) 325 MG tablet Take 325 mg by mouth every 6 (six) hours as needed (for pain.).     albuterol (PROVENTIL HFA;VENTOLIN HFA) 108 (90 Base) MCG/ACT inhaler Inhale 2 puffs into the lungs every 6 (six) hours as needed for wheezing or shortness of breath. 1 Inhaler 2   amLODipine (NORVASC) 5 MG tablet Take 1 tablet (5 mg total) by mouth daily. 90 tablet 3   aspirin EC 81 MG tablet Take 81 mg by mouth daily.     atorvastatin (LIPITOR) 10 MG tablet Take 1 tablet (10 mg total) by mouth daily. 90 tablet 3   hydrochlorothiazide (MICROZIDE) 12.5 MG capsule Take 1 capsule (12.5 mg total) by mouth daily. 90 capsule 3   lisinopril (PRINIVIL,ZESTRIL) 40 MG tablet Take 1 tablet (40 mg total) by mouth daily. 90 tablet 3   vitamin B-12 (CYANOCOBALAMIN) 1000 MCG tablet Take 1,000 mcg by mouth daily.     Vitamin D, Cholecalciferol, 1000 units TABS Take 1 tablet by mouth daily.     ferrous sulfate 325 (65 FE) MG tablet Take 65 mg by mouth daily with breakfast.     No current facility-administered medications for this visit.     Review of Systems  Constitutional: Positive for weight loss (1lb). Negative for chills, diaphoresis, fever and malaise/fatigue.       Doing "alright".  HENT: Positive for hearing loss. Negative for congestion, nosebleeds, sinus pain and sore throat.   Eyes: Negative.  Negative for blurred vision, double vision and photophobia.  Respiratory: Positive for shortness of breath (exertional). Negative for cough, sputum production and wheezing.   Cardiovascular: Negative.  Negative for  chest pain, palpitations, orthopnea, leg swelling and PND.  Gastrointestinal: Negative.  Negative for abdominal pain, blood in stool, constipation, diarrhea, melena, nausea and vomiting.  Genitourinary: Negative.  Negative for dysuria, frequency, hematuria and urgency.       CKD-III.  Musculoskeletal: Negative.  Negative for back pain, joint pain and myalgias.  Skin: Negative.  Negative for rash.  Neurological: Negative.  Negative for dizziness, tingling, sensory change, weakness and headaches.  Endo/Heme/Allergies: Negative.  Does not bruise/bleed easily.  Psychiatric/Behavioral: Negative.  Negative for depression, memory loss and substance abuse. The patient is not nervous/anxious and does not have insomnia.   All other systems reviewed and are negative.  Performance status (ECOG): 1  Vitals Blood pressure 139/69, pulse 80, temperature 98.4 F (36.9 C), temperature source Tympanic, resp. rate 18, height 5\' 1"  (1.549 m), weight 154 lb 15.7 oz (70.3 kg), SpO2 97 %.   Physical Exam  Constitutional: She is oriented to person, place, and time. She appears well-developed and well-nourished. No distress.  HENT:  Head: Normocephalic and atraumatic.  Mouth/Throat: Oropharynx is clear and moist.  Long graying braided hair. Mask.  Eyes: Pupils are equal, round, and reactive to light. Conjunctivae and lids are normal.  Right eye exhibits no discharge. Left eye exhibits no discharge. No scleral icterus.  Neck: Normal range of motion. Neck supple. No JVD present. Carotid bruit is not present.  Cardiovascular: Normal rate and regular rhythm.  Murmur (3/6) heard. Pulmonary/Chest: Effort normal and breath sounds normal. No respiratory distress. She has no wheezes.  Abdominal: Soft. Normal appearance and bowel sounds are normal. She exhibits mass (porcelain gallbladder). She exhibits no distension. There is no splenomegaly or hepatomegaly. There is no abdominal tenderness. There is no rebound and no  guarding.  Fully round.  Musculoskeletal: Normal range of motion.        General: No edema.  Lymphadenopathy:    She has no cervical adenopathy.    She has no axillary adenopathy.       Right: No supraclavicular adenopathy present.       Left: No supraclavicular adenopathy present.  Neurological: She is alert and oriented to person, place, and time.  Skin: Skin is warm, dry and intact. No rash noted. She is not diaphoretic. No pallor.  Psychiatric: She has a normal mood and affect. Her behavior is normal. Judgment and thought content normal.  Nursing note and vitals reviewed.   No visits with results within 3 Day(s) from this visit.  Latest known visit with results is:  Hospital Outpatient Visit on 05/09/2019  Component Date Value Ref Range Status   SARS Coronavirus 2 05/09/2019 NEGATIVE  NEGATIVE Final   Comment: (NOTE) SARS-CoV-2 target nucleic acids are NOT DETECTED. The SARS-CoV-2 RNA is generally detectable in upper and lower respiratory specimens during the acute phase of infection. Negative results do not preclude SARS-CoV-2 infection, do not rule out co-infections with other pathogens, and should not be used as the sole basis for treatment or other patient management decisions. Negative results must be combined with clinical observations, patient history, and epidemiological information. The expected result is Negative. Fact Sheet for Patients: SugarRoll.be Fact Sheet for Healthcare Providers: https://www.woods-[facility]hews.com/ This test is not yet approved or cleared by the [place] FDA and  has been authorized for detection and/or diagnosis of SARS-CoV-2 by FDA under an Emergency Use Authorization (EUA). This EUA will remain  in effect (meaning this test can be used) for the duration of the COVID-19 declaration under Section 56                          4(b)(1) of the Act, 21 U.S.C. section 360bbb-3(b)(1), unless the  authorization is terminated or revoked sooner. Performed at [facility], [place] [address], [place], [place] [address]     Assessment:  Madison Franklin is a 73 y.o. female with anemia of chronic kidney disease.  She has had a gradual decline in renal function of the course of the last 8 months  Work-up on 05/26/2018 revealed a hematocrit of 33.5, hemoglobin 10.3, and MCV 78.4. Ferritin was 20. Iron saturation was 8% with a TIBC of 395. B12 was 259 (low). Folate was 10.3. Creatinine was 1.51.  She has iron deficiency anemia and B12 deficiency.  She is on oral B12.  B12 was 259 on 05/26/2018 and 1080 on 08/03/2018.  Anti-parietal antibody and intrinsic factor antibodies were negative on 06/30/2018.  B12 was discontinued on 12/15/2018.  Oral iron began 08/04/2018 and discontinued on 04/27/2019.  Ferritin has been followed: 31 on 11/24/2017, 20 on 05/26/2018, 17 on 06/30/2018, 27 on 08/03/2018, 34 on 08/31/2018, 33 on 10/11/2018, 46 on 11/23/2018, and 129 on 03/23/2019.  She has a history of colonic polyps.   Colonoscopy on 11/14/2016 revealed a sub-optimal prep. Findings included a 10 mm polyp in the cecum, seven 4-10 mm sessile polyps in the transverse colon, a 6 mm polyp in the descending colon, three 5-6 mm sessile polyps in the sigmoid colon,.  Pathology revealed 7 tubular adenomas, 2 hyperplastic polyps, and 2 inflammatory polyps.  Colonoscopy on 11/10/2018 revealed three 5 to 6 mm polyps in the ascending colon (2 tubular adenomas; sessile serrated adenoma), one 8 mm polyp in the transverse colon (tubular adenoma), one 5 mm polyp in the transverse colon (tubular adenoma), one 5 mm polyp in the sigmoid colon (tubular adenoma), one 7 mm polyp at the recto-sigmoid colon (hyperplastic polyp), and non-bleeding external and internal hemorrhoids.  She will have a follow-up colonoscopy in 3 years.  EGD on 11/10/2018 revealed non-bleeding erosive gastropathy. Pathology revealed chronic inactive  atrophic gastritis with intestinal metaplasia.  There was a single non-bleeding erosion in the upper third of the esophagus.  EGD on 05/11/2019 revealed normal duodenal bulb and second portion of the duodenum. There was non-bleeding erosive gastropathy and gastric mucosal atrophy.  There was a normal gastroesophageal junction and esophagus. Pathology revealed chronic inactive atrophic gastritis with intestinal metaplasia, negative for H pylori.   She has a 15 pack year smoking history.  Chest CT on 03/13/2017 revealed a 4 mm LUL pulmonary nodule (decreased from prior).  Low dose chest CT on 06/01/2018 revealed Lung-RADS 2, benign appearance or behavior.   There was gallbladder wall calcifications indicative of "porcelain gallbladder".   Abdomen and pelvic CT on 07/25/2018 revealed a chronically abnormal gallbladder with circumferential partially calcified wall thickening and heterogeneity. This was partially visible in 2016 and 2015.  There appears to be an exophytic 3 cm component of calcified soft tissue extending inferiorly from the main body of the gallbladder.  Obliterated lumen perhaps that is gallbladder fundus is uncertain.  She has stage III chronic renal insufficiency.  Creatinine was 1.27 on 05/25/2019.  Symptomatically, she notes shortness of breath on exertion.  She has a palpable gallbladder.  Hemoglobin is 12.  Plan: 1.   Labs today: CBC with diff, CMP, iron studies, ferritin, folate, B12.  2.   Anemia of chronic renal disease Labs reviewed with patient.  Hemoglobin 12.0 and hematocrit 39.8. No Procrit today. 3.   Iron deficiency Ferritin 86. Iron saturation 20% with a TIBC of 318. Continue ferrous sulfate 325 mg a day with vitamin C. Ferritin goal 100. 4.   B12 deficiency B12 level was 462 today. Continue oral B12.   B12 goal > 400. Folate 19.8 today. 5.   RUQ mass Patient has a prominent RUQ on exam c/w porcelain gallbladder. Continue follow-up with Dr. Dahlia Byes. 6.   RTC  in 3 months for labs (CBC with diff). 7.   RTC in 6 months for MD assessment and labs (CBC with diff, +/- others).  I discussed the assessment and treatment plan with the patient.  The patient was provided an opportunity to ask questions and all were answered.  The patient agreed with the plan and demonstrated an understanding of the instructions.  The patient was advised to call back if the symptoms worsen or if the condition fails to improve as anticipated.  I provided 15 minutes of face-to-face time during this this encounter and > 50% was spent counseling as documented under my assessment and plan.    Lequita Asal, MD, PhD    05/25/2019, 9:08 AM  I, Molly Dorshimer, am acting as Education administrator for [employer]. Mike Gip, MD, PhD.  I, Keoni Risinger C. Mike Gip, MD, have reviewed the above documentation for accuracy and completeness, and I agree with the above.

## 2019-05-25 ENCOUNTER — Telehealth: Payer: Self-pay

## 2019-05-25 ENCOUNTER — Other Ambulatory Visit: Payer: Self-pay | Admitting: Hematology and Oncology

## 2019-05-25 ENCOUNTER — Inpatient Hospital Stay: Payer: Medicare Other | Attending: Hematology and Oncology

## 2019-05-25 ENCOUNTER — Encounter: Payer: Self-pay | Admitting: Hematology and Oncology

## 2019-05-25 ENCOUNTER — Other Ambulatory Visit: Payer: Self-pay

## 2019-05-25 ENCOUNTER — Inpatient Hospital Stay (HOSPITAL_BASED_OUTPATIENT_CLINIC_OR_DEPARTMENT_OTHER): Payer: Medicare Other | Admitting: Hematology and Oncology

## 2019-05-25 ENCOUNTER — Other Ambulatory Visit: Payer: Medicare Other

## 2019-05-25 VITALS — BP 139/69 | HR 80 | Temp 98.4°F | Resp 18 | Ht 61.0 in | Wt 155.0 lb

## 2019-05-25 DIAGNOSIS — D509 Iron deficiency anemia, unspecified: Secondary | ICD-10-CM

## 2019-05-25 DIAGNOSIS — J449 Chronic obstructive pulmonary disease, unspecified: Secondary | ICD-10-CM | POA: Insufficient documentation

## 2019-05-25 DIAGNOSIS — E538 Deficiency of other specified B group vitamins: Secondary | ICD-10-CM

## 2019-05-25 DIAGNOSIS — Z87891 Personal history of nicotine dependence: Secondary | ICD-10-CM | POA: Insufficient documentation

## 2019-05-25 DIAGNOSIS — R634 Abnormal weight loss: Secondary | ICD-10-CM | POA: Insufficient documentation

## 2019-05-25 DIAGNOSIS — D631 Anemia in chronic kidney disease: Secondary | ICD-10-CM | POA: Insufficient documentation

## 2019-05-25 DIAGNOSIS — H9190 Unspecified hearing loss, unspecified ear: Secondary | ICD-10-CM | POA: Insufficient documentation

## 2019-05-25 DIAGNOSIS — N183 Chronic kidney disease, stage 3 unspecified: Secondary | ICD-10-CM

## 2019-05-25 DIAGNOSIS — R1901 Right upper quadrant abdominal swelling, mass and lump: Secondary | ICD-10-CM | POA: Insufficient documentation

## 2019-05-25 DIAGNOSIS — Z7982 Long term (current) use of aspirin: Secondary | ICD-10-CM | POA: Insufficient documentation

## 2019-05-25 DIAGNOSIS — Z79899 Other long term (current) drug therapy: Secondary | ICD-10-CM | POA: Insufficient documentation

## 2019-05-25 DIAGNOSIS — I272 Pulmonary hypertension, unspecified: Secondary | ICD-10-CM | POA: Insufficient documentation

## 2019-05-25 DIAGNOSIS — I129 Hypertensive chronic kidney disease with stage 1 through stage 4 chronic kidney disease, or unspecified chronic kidney disease: Secondary | ICD-10-CM | POA: Insufficient documentation

## 2019-05-25 DIAGNOSIS — E785 Hyperlipidemia, unspecified: Secondary | ICD-10-CM | POA: Diagnosis not present

## 2019-05-25 LAB — BASIC METABOLIC PANEL
Anion gap: 11 (ref 5–15)
BUN: 31 mg/dL — ABNORMAL HIGH (ref 8–23)
CO2: 20 mmol/L — ABNORMAL LOW (ref 22–32)
Calcium: 9.3 mg/dL (ref 8.9–10.3)
Chloride: 106 mmol/L (ref 98–111)
Creatinine, Ser: 1.27 mg/dL — ABNORMAL HIGH (ref 0.44–1.00)
GFR calc Af Amer: 48 mL/min — ABNORMAL LOW (ref >60)
GFR calc non Af Amer: 42 mL/min — ABNORMAL LOW (ref >60)
Glucose, Bld: 119 mg/dL — ABNORMAL HIGH (ref 70–99)
Potassium: 4.2 mmol/L (ref 3.5–5.1)
Sodium: 137 mmol/L (ref 135–145)

## 2019-05-25 LAB — CBC WITH DIFFERENTIAL/PLATELET
Abs Immature Granulocytes: 0.06 10*3/uL (ref 0.00–0.07)
Basophils Absolute: 0.1 10*3/uL (ref 0.0–0.1)
Basophils Relative: 1 %
Eosinophils Absolute: 0.2 10*3/uL (ref 0.0–0.5)
Eosinophils Relative: 2 %
HCT: 39.8 % (ref 36.0–46.0)
Hemoglobin: 12 g/dL (ref 12.0–15.0)
Immature Granulocytes: 1 %
Lymphocytes Relative: 17 %
Lymphs Abs: 1.5 10*3/uL (ref 0.7–4.0)
MCH: 26.3 pg (ref 26.0–34.0)
MCHC: 30.2 g/dL (ref 30.0–36.0)
MCV: 87.3 fL (ref 80.0–100.0)
Monocytes Absolute: 0.7 10*3/uL (ref 0.1–1.0)
Monocytes Relative: 8 %
Neutro Abs: 6.5 10*3/uL (ref 1.7–7.7)
Neutrophils Relative %: 71 %
Platelets: 171 10*3/uL (ref 150–400)
RBC: 4.56 MIL/uL (ref 3.87–5.11)
RDW: 14 % (ref 11.5–15.5)
WBC: 9 10*3/uL (ref 4.0–10.5)
nRBC: 0 % (ref 0.0–0.2)

## 2019-05-25 LAB — FERRITIN: Ferritin: 86 ng/mL (ref 11–307)

## 2019-05-25 LAB — FOLATE: Folate: 19.8 ng/mL (ref >5.9)

## 2019-05-25 LAB — IRON AND TIBC
Iron: 64 ug/dL (ref 28–170)
Saturation Ratios: 20 % (ref 10.4–31.8)
TIBC: 318 ug/dL (ref 250–450)
UIBC: 254 ug/dL

## 2019-05-25 LAB — VITAMIN B12: Vitamin B-12: 462 pg/mL (ref 180–914)

## 2019-05-25 NOTE — Progress Notes (Signed)
No new changes noted today 

## 2019-05-25 NOTE — Telephone Encounter (Signed)
Spoke with the patient to inform her that her ferritin was at 86 today and hbg was 12.0.  The patient was understanding and agreeable with her results.

## 2019-05-26 ENCOUNTER — Other Ambulatory Visit: Payer: Self-pay

## 2019-05-26 DIAGNOSIS — K828 Other specified diseases of gallbladder: Secondary | ICD-10-CM

## 2019-06-08 ENCOUNTER — Ambulatory Visit: Payer: Medicare Other

## 2019-06-16 ENCOUNTER — Telehealth: Payer: Self-pay | Admitting: *Deleted

## 2019-06-16 NOTE — Telephone Encounter (Signed)
Left message for patient to notify them that it is time to schedule annual low dose lung cancer screening CT scan. Instructed patient to call back to verify information prior to the scan being scheduled.  

## 2019-06-20 ENCOUNTER — Ambulatory Visit: Payer: Medicare Other | Admitting: Surgery

## 2019-06-28 ENCOUNTER — Other Ambulatory Visit: Payer: Self-pay

## 2019-06-28 ENCOUNTER — Ambulatory Visit
Admission: RE | Admit: 2019-06-28 | Discharge: 2019-06-28 | Disposition: A | Discharge status: Home / Self Care | Payer: Medicare Other | Source: Ambulatory Visit | Attending: Surgery | Admitting: Surgery

## 2019-06-28 DIAGNOSIS — K8020 Calculus of gallbladder without cholecystitis without obstruction: Secondary | ICD-10-CM | POA: Diagnosis not present

## 2019-06-28 DIAGNOSIS — K828 Other specified diseases of gallbladder: Secondary | ICD-10-CM | POA: Diagnosis not present

## 2019-06-29 ENCOUNTER — Telehealth: Payer: Self-pay | Admitting: *Deleted

## 2019-06-29 ENCOUNTER — Encounter: Payer: Self-pay | Admitting: *Deleted

## 2019-06-29 NOTE — Telephone Encounter (Signed)
Gallbladder still has stones. Notified patient as instructed,

## 2019-07-04 ENCOUNTER — Other Ambulatory Visit: Payer: Self-pay

## 2019-07-04 ENCOUNTER — Ambulatory Visit (INDEPENDENT_AMBULATORY_CARE_PROVIDER_SITE_OTHER): Payer: Medicare Other | Admitting: Surgery

## 2019-07-04 ENCOUNTER — Ambulatory Visit: Payer: Medicare Other | Admitting: Surgery

## 2019-07-04 ENCOUNTER — Encounter: Payer: Self-pay | Admitting: Surgery

## 2019-07-04 VITALS — BP 175/87 | HR 86 | Temp 97.9°F | Ht 61.0 in | Wt 155.0 lb

## 2019-07-04 DIAGNOSIS — K828 Other specified diseases of gallbladder: Secondary | ICD-10-CM

## 2019-07-04 NOTE — Patient Instructions (Addendum)
We will see you back in 1 year with an ultrasound of the gallbladder prior.   Follow-up with our office as needed.  Please call and ask to speak with a nurse if you develop questions or concerns.

## 2019-07-04 NOTE — Progress Notes (Signed)
Outpatient Surgical Follow Up  07/04/2019  Madison Franklin is an 73 y.o. female.   Chief Complaint  Patient presents with  . Follow-up    porcelain gallbladder     HPI: Well-known to me with a history of porcelain gallbladder.  She does have significant COPD and anemia.  Over the last year or so actually her condition overall has improved and she is able to walk a little bit farther distances.  She does have significant anemia that is requiring follow-up by hematology.  She did have a recent ultrasound follow-up that I have personally reviewed showing evidence of porcelain gallbladder.  I have also reviewed the CT scan showing evidence of porcelain gallbladder with gallstones.  No evidence of gallbladder cancer.  Past Medical History:  Diagnosis Date  . COPD (chronic obstructive pulmonary disease) ([place])   . Dyspnea   . Hearing aid worn    bilateral  . Hyperlipidemia   . Hypertension   . Osteoporosis   . Personal history of tobacco use, presenting hazards to health 08/15/2015  . Pulmonary hypertension, mild ([facility])    mild to moderate    Past Surgical History:  Procedure Laterality Date  . ABDOMINAL HYSTERECTOMY    . COLONOSCOPY WITH PROPOFOL N/A 11/14/2016   Procedure: COLONOSCOPY WITH PROPOFOL;  Surgeon: [name], MD;  Location: [place];  Service: Endoscopy;  Laterality: N/A;  . COLONOSCOPY WITH PROPOFOL N/A 11/10/2018   Procedure: COLONOSCOPY WITH PROPOFOL;  Surgeon: Lin Landsman, MD;  Location: [place];  Service: Endoscopy;  Laterality: N/A;  . ESOPHAGOGASTRODUODENOSCOPY (EGD) WITH PROPOFOL  11/10/2018   Procedure: ESOPHAGOGASTRODUODENOSCOPY (EGD) WITH PROPOFOL;  Surgeon: Lin Landsman, MD;  Location: [place];  Service: Endoscopy;;  . ESOPHAGOGASTRODUODENOSCOPY (EGD) WITH PROPOFOL N/A 05/11/2019   Procedure: ESOPHAGOGASTRODUODENOSCOPY (EGD) WITH PROPOFOL;  Surgeon: Lin Landsman, MD;  Location: [place];  Service:  Endoscopy;  Laterality: N/A;  . long nodule    . POLYPECTOMY  11/14/2016   Procedure: POLYPECTOMY;  Surgeon: [name], MD;  Location: [place];  Service: Endoscopy;;  . POLYPECTOMY  11/10/2018   Procedure: POLYPECTOMY;  Surgeon: Lin Landsman, MD;  Location: [place];  Service: Endoscopy;;  . RIGHT HEART CATH Right 07/14/2017   Procedure: RIGHT HEART CATH;  Surgeon: Nelva Bush, MD;  Location: [place] [facility];  Service: Cardiovascular;  Laterality: Right;    Family History  Problem Relation Age of Onset  . Heart disease Neg Hx     Social History:  reports that she quit smoking about 2 years ago. Her smoking use included cigarettes. She has a 15.00 pack-year smoking history. She has never used smokeless tobacco. She reports that she does not drink alcohol or use drugs.  Allergies:  Allergies  Allergen Reactions  . Latex Itching and Rash    With latex gloves (when worn)    Medications reviewed.    ROS Full ROS performed and is otherwise negative other than what is stated in HPI   BP (!) 175/87   Pulse 86   Temp 97.9 F (36.6 C)   Ht 5\' 1"  (1.549 m)   Wt 155 lb (70.3 kg)   LMP  (LMP Unknown)   SpO2 92%   BMI 29.29 kg/m   Physical Exam Vitals signs and nursing note reviewed. Exam conducted with a chaperone present.  Constitutional:      General: She is not in acute distress.    Appearance: Normal appearance. She is  normal weight.     Comments: Debilitated, her pulse ox dropped to 88 on room air  Eyes:     General: No scleral icterus.       Right eye: No discharge.        Left eye: No discharge.  Pulmonary:     Effort: Pulmonary effort is normal. No respiratory distress.     Breath sounds: No stridor.  Abdominal:     General: There is no distension.     Palpations: Abdomen is soft. There is no mass.     Tenderness: There is no abdominal tenderness. There is no guarding or rebound.     Comments: Diastases recti   Musculoskeletal: Normal range of motion.     Comments: Severe scoliosis  Skin:    General: Skin is warm and dry.     Capillary Refill: Capillary refill takes less than 2 seconds.  Neurological:     General: No focal deficit present.     Mental Status: She is alert and oriented to person, place, and time.  Psychiatric:        Mood and Affect: Mood normal.        Behavior: Behavior normal.        Thought Content: Thought content normal.        Judgment: Judgment normal.      Assessment/Plan: Porcelain gallbladder in a 73 year old female with COPD severe scoliosis and significant fragility.  Discussed with them again about her disease.  They still wish to continue follow-up and I will see her back in 1 year with an ultrasound.  No need for emergent surgical intervention.  Greater than 50% of the 25 minutes  visit was spent in counseling/coordination of care   Caroleen Hamman, MD [facility]

## 2019-07-22 ENCOUNTER — Encounter: Payer: Self-pay | Admitting: *Deleted

## 2019-08-03 ENCOUNTER — Ambulatory Visit: Payer: Medicare Other | Admitting: Gastroenterology

## 2019-08-17 ENCOUNTER — Other Ambulatory Visit: Payer: Self-pay | Admitting: Hematology and Oncology

## 2019-08-20 ENCOUNTER — Other Ambulatory Visit: Payer: Self-pay | Admitting: Nurse Practitioner

## 2019-08-20 DIAGNOSIS — F17200 Nicotine dependence, unspecified, uncomplicated: Secondary | ICD-10-CM

## 2019-08-20 DIAGNOSIS — I10 Essential (primary) hypertension: Secondary | ICD-10-CM

## 2019-08-20 NOTE — Telephone Encounter (Signed)
Requested medication (s) are due for refill today: yes  Requested medication (s) are on the active medication list: yes  Last refill:  08/19/2019  Future visit scheduled: yes  Notes to clinic:  Review for refill    Requested Prescriptions  Pending Prescriptions Disp Refills   lisinopril (ZESTRIL) 40 MG tablet [Pharmacy Med Name: LISINOPRIL 40 MG TABLET] 90 tablet 0    Sig: Take 1 tablet (40 mg total) by mouth daily.     Cardiovascular:  ACE Inhibitors Failed - 08/20/2019  2:30 PM      Failed - Cr in normal range and within 180 days    Creatinine, Ser  Date Value Ref Range Status  05/25/2019 1.27 (H) 0.44 - 1.00 mg/dL Final         Failed - Last BP in normal range    BP Readings from Last 1 Encounters:  07/04/19 (!) 175/87         Failed - Valid encounter within last 6 months    Recent Outpatient Visits          1 year ago Colon cancer screening   [facility] [name], NP   1 year ago Chronic kidney disease, stage 3 ([place])   [place] [name], NP   2 years ago Mixed hyperlipidemia   [facility] [place], Malachy Mood, NP   2 years ago COPD, severe ([place])   [place] [name], NP   2 years ago Annual physical exam   [facility] Volney American, [place]      Future Appointments            In 1 month [place], [facility]            Passed - K in normal range and within 180 days    Potassium  Date Value Ref Range Status  05/25/2019 4.2 3.5 - 5.1 mmol/L Final         Passed - Patient is not pregnant       hydrochlorothiazide (MICROZIDE) 12.5 MG capsule [Pharmacy Med Name: HYDROCHLOROTHIAZIDE 12.5 MG CP] 90 capsule 0    Sig: Take 1 capsule (12.5 mg total) by mouth daily.     Cardiovascular: Diuretics - Thiazide Failed - 08/20/2019  2:30 PM      Failed - Cr in normal range and within 360 days    Creatinine, Ser  Date Value Ref Range Status  05/25/2019 1.27 (H) 0.44 -  1.00 mg/dL Final         Failed - Last BP in normal range    BP Readings from Last 1 Encounters:  07/04/19 (!) 175/87         Failed - Valid encounter within last 6 months    Recent Outpatient Visits          1 year ago Colon cancer screening   [facility] [name], NP   1 year ago Chronic kidney disease, stage 3 ([place])   [facility] [name], NP   2 years ago Mixed hyperlipidemia   [facility] [name], NP   2 years ago COPD, severe ([place])   [place] [name], NP   2 years ago Annual physical exam   [facility] Volney American, [place]      Future Appointments            In 1 month [facility], [place]  Passed - Ca in normal range and within 360 days    Calcium  Date Value Ref Range Status  05/25/2019 9.3 8.9 - 10.3 mg/dL Final         Passed - K in normal range and within 360 days    Potassium  Date Value Ref Range Status  05/25/2019 4.2 3.5 - 5.1 mmol/L Final         Passed - Na in normal range and within 360 days    Sodium  Date Value Ref Range Status  05/25/2019 137 135 - 145 mmol/L Final  11/24/2017 139 134 - 144 mmol/L Final          atorvastatin (LIPITOR) 10 MG tablet [Pharmacy Med Name: ATORVASTATIN 10 MG TABLET] 90 tablet 0    Sig: Take 1 tablet (10 mg total) by mouth daily.     Cardiovascular:  Antilipid - Statins Failed - 08/20/2019  2:30 PM      Failed - Total Cholesterol in normal range and within 360 days    Cholesterol, Total  Date Value Ref Range Status  11/24/2017 181 100 - 199 mg/dL Final         Failed - LDL in normal range and within 360 days    LDL Calculated  Date Value Ref Range Status  11/24/2017 102 (H) 0 - 99 mg/dL Final         Failed - HDL in normal range and within 360 days    HDL  Date Value Ref Range Status  11/24/2017 51 >39 mg/dL Final         Failed - Triglycerides in normal range and within 360  days    Triglycerides  Date Value Ref Range Status  11/24/2017 142 0 - 149 mg/dL Final         Passed - Patient is not pregnant      Passed - Valid encounter within last 12 months    Recent Outpatient Visits          1 year ago Colon cancer screening   [facility] [name], NP   1 year ago Chronic kidney disease, stage 3 ([place])   [facility] [name], NP   2 years ago Mixed hyperlipidemia   [facility] [name], NP   2 years ago COPD, severe ([place])   [place] [name], NP   2 years ago Annual physical exam   [facility] Volney [facility], [place]      Future Appointments            In 1 month [place], [facility]             amLODipine (NORVASC) 5 MG tablet [Pharmacy Med Name: AMLODIPINE BESYLATE 5 MG TAB] 90 tablet 0    Sig: Take 1 tablet (5 mg total) by mouth daily.     Cardiovascular:  Calcium Channel Blockers Failed - 08/20/2019  2:30 PM      Failed - Last BP in normal range    BP Readings from Last 1 Encounters:  07/04/19 (!) 175/87         Failed - Valid encounter within last 6 months    Recent Outpatient Visits          1 year ago Colon cancer screening   [facility] [name], NP   1 year ago Chronic kidney disease, stage 3 ([place])   [facility] [name], NP   2  years ago Mixed hyperlipidemia   [place], NP   2 years ago COPD, severe [facility])   [place] [name], NP   2 years ago Annual physical exam   [facility] Volney [facility], [place]      Future Appointments            In 1 month [facility], [facility]

## 2019-08-22 NOTE — Telephone Encounter (Signed)
Have not met this patient face to face as of yet.  Will refill for 90 day supply, but would like visit even if virtual and labs.  Thank you.

## 2019-08-22 NOTE — Telephone Encounter (Signed)
Called and left a detailed message letting patient know that she needs to call to schedule an office visit.

## 2019-08-25 ENCOUNTER — Inpatient Hospital Stay: Payer: Medicare Other | Attending: Hematology and Oncology

## 2019-08-25 ENCOUNTER — Other Ambulatory Visit: Payer: Self-pay

## 2019-08-25 DIAGNOSIS — D631 Anemia in chronic kidney disease: Secondary | ICD-10-CM | POA: Diagnosis not present

## 2019-08-25 DIAGNOSIS — N183 Chronic kidney disease, stage 3 unspecified: Secondary | ICD-10-CM | POA: Diagnosis not present

## 2019-08-25 DIAGNOSIS — D509 Iron deficiency anemia, unspecified: Secondary | ICD-10-CM | POA: Diagnosis not present

## 2019-08-25 DIAGNOSIS — I129 Hypertensive chronic kidney disease with stage 1 through stage 4 chronic kidney disease, or unspecified chronic kidney disease: Secondary | ICD-10-CM | POA: Diagnosis not present

## 2019-08-25 DIAGNOSIS — E538 Deficiency of other specified B group vitamins: Secondary | ICD-10-CM | POA: Insufficient documentation

## 2019-08-25 LAB — CBC WITH DIFFERENTIAL/PLATELET
Abs Immature Granulocytes: 0.03 10*3/uL (ref 0.00–0.07)
Basophils Absolute: 0.1 10*3/uL (ref 0.0–0.1)
Basophils Relative: 1 %
Eosinophils Absolute: 0.3 10*3/uL (ref 0.0–0.5)
Eosinophils Relative: 3 %
HCT: 37.8 % (ref 36.0–46.0)
Hemoglobin: 11.3 g/dL — ABNORMAL LOW (ref 12.0–15.0)
Immature Granulocytes: 0 %
Lymphocytes Relative: 18 %
Lymphs Abs: 1.7 10*3/uL (ref 0.7–4.0)
MCH: 25.7 pg — ABNORMAL LOW (ref 26.0–34.0)
MCHC: 29.9 g/dL — ABNORMAL LOW (ref 30.0–36.0)
MCV: 85.9 fL (ref 80.0–100.0)
Monocytes Absolute: 0.7 10*3/uL (ref 0.1–1.0)
Monocytes Relative: 8 %
Neutro Abs: 6.9 10*3/uL (ref 1.7–7.7)
Neutrophils Relative %: 70 %
Platelets: 168 10*3/uL (ref 150–400)
RBC: 4.4 MIL/uL (ref 3.87–5.11)
RDW: 14.6 % (ref 11.5–15.5)
WBC: 9.8 10*3/uL (ref 4.0–10.5)
nRBC: 0 % (ref 0.0–0.2)

## 2019-08-25 LAB — FERRITIN: Ferritin: 76 ng/mL (ref 11–307)

## 2019-09-27 ENCOUNTER — Encounter: Payer: Self-pay | Admitting: *Deleted

## 2019-09-29 ENCOUNTER — Telehealth: Payer: Self-pay | Admitting: Nurse Practitioner

## 2019-09-29 NOTE — Chronic Care Management (AMB) (Signed)
Chronic Care Management   Note  09/29/2019 Name: Madison Franklin MRN: [id number] DOB: 04/26/1946  [name] is a 73 y.o. year old female who is a primary care patient of Cannady, Barbaraann Faster, NP. I reached out to [facility] by phone today in response to a referral sent by Ms. Terrace Arabia Bogacz's health plan.     Ms. Doig was given information about Chronic Care Management services today including:  1. CCM service includes personalized support from designated clinical staff supervised by her physician, including individualized plan of care and coordination with other care providers 2. 24/7 contact phone numbers for assistance for urgent and routine care needs. 3. Service will only be billed when office clinical staff spend 20 minutes or more in a month to coordinate care. 4. Only one practitioner may furnish and bill the service in a calendar month. 5. The patient may stop CCM services at any time (effective at the end of the month) by phone call to the office staff. 6. The patient will be responsible for cost sharing (co-pay) of up to 20% of the service fee (after annual deductible is met).  Patient agreed to services and verbal consent obtained.   Follow up plan: Telephone appointment with CCM team member scheduled for: 11/08/2019  [employer], [facility]  [place], [place] [address] Direct Dial: [email or web address]  [email or web address]: [email or web address]

## 2019-10-02 ENCOUNTER — Encounter: Payer: Self-pay | Admitting: Nurse Practitioner

## 2019-10-02 DIAGNOSIS — I272 Pulmonary hypertension, unspecified: Secondary | ICD-10-CM | POA: Insufficient documentation

## 2019-10-02 DIAGNOSIS — I2721 Secondary pulmonary arterial hypertension: Secondary | ICD-10-CM | POA: Insufficient documentation

## 2019-10-07 ENCOUNTER — Other Ambulatory Visit: Payer: Self-pay

## 2019-10-07 ENCOUNTER — Ambulatory Visit (INDEPENDENT_AMBULATORY_CARE_PROVIDER_SITE_OTHER): Payer: Medicare Other | Admitting: Nurse Practitioner

## 2019-10-07 ENCOUNTER — Encounter: Payer: Self-pay | Admitting: Nurse Practitioner

## 2019-10-07 VITALS — BP 139/65 | Temp 97.4°F | Wt 151.8 lb

## 2019-10-07 DIAGNOSIS — D509 Iron deficiency anemia, unspecified: Secondary | ICD-10-CM

## 2019-10-07 DIAGNOSIS — I10 Essential (primary) hypertension: Secondary | ICD-10-CM

## 2019-10-07 DIAGNOSIS — J449 Chronic obstructive pulmonary disease, unspecified: Secondary | ICD-10-CM

## 2019-10-07 DIAGNOSIS — R911 Solitary pulmonary nodule: Secondary | ICD-10-CM | POA: Diagnosis not present

## 2019-10-07 DIAGNOSIS — E538 Deficiency of other specified B group vitamins: Secondary | ICD-10-CM

## 2019-10-07 DIAGNOSIS — N1831 Chronic kidney disease, stage 3a: Secondary | ICD-10-CM

## 2019-10-07 DIAGNOSIS — E782 Mixed hyperlipidemia: Secondary | ICD-10-CM | POA: Diagnosis not present

## 2019-10-07 DIAGNOSIS — I129 Hypertensive chronic kidney disease with stage 1 through stage 4 chronic kidney disease, or unspecified chronic kidney disease: Secondary | ICD-10-CM

## 2019-10-07 MED ORDER — ALBUTEROL SULFATE HFA 108 (90 BASE) MCG/ACT IN AERS: 2.0000 | INHALATION_SPRAY | Freq: Four times a day (QID) | RESPIRATORY_TRACT | 3 refills | Status: DC | PRN

## 2019-10-07 MED ORDER — ANORO ELLIPTA 62.5-25 MCG/INH IN AEPB: 1.0000 | INHALATION_SPRAY | Freq: Every day | RESPIRATORY_TRACT | 6 refills | Status: DC

## 2019-10-07 NOTE — Assessment & Plan Note (Signed)
Recommend to her that she schedule her follow-up annual lung CT, of note they have been trying to contact her. 

## 2019-10-07 NOTE — Assessment & Plan Note (Signed)
Chronic, ongoing with BP at goal on home readings.  Continue current medication regimen and adjust as needed.  Recommend continue to monitor BP at home daily.  Will check outpatient labs: CMP.

## 2019-10-07 NOTE — Assessment & Plan Note (Signed)
Chronic, ongoing.  Continue current medication regimen and adjust as needed  Will check outpatient labs: CMP and lipid panel.

## 2019-10-07 NOTE — Assessment & Plan Note (Signed)
Chronic, ongoing.  Stable at this time.  Will continue Lisinopril for kidney protection.  CMP outpatient.  Refer to nephrology if any decline.

## 2019-10-07 NOTE — Progress Notes (Signed)
LVM to make a 4 week apt and labs ,sent letter

## 2019-10-07 NOTE — Assessment & Plan Note (Signed)
Chronic, ongoing.  Continue current supplements and collaboration with hematology.  Recommend she maintain her follow-up visits with them.

## 2019-10-07 NOTE — Progress Notes (Signed)
BP 139/65   Temp (!) 97.4 F (36.3 C) (Oral)   Wt 151 lb 12.8 oz (68.9 kg)   LMP  (LMP Unknown)   BMI 28.68 kg/m    Subjective:    Patient ID: Madison Franklin, female    DOB: 08/16/1946, 73 y.o.   MRN: [id number]  HPI: Madison Franklin is a 73 y.o. female  Chief Complaint  Patient presents with  . Hyperlipidemia  . Hypertension    . This visit was completed via telephone due to the restrictions of the COVID-19 pandemic. All issues as above were discussed and addressed but no physical exam was performed. If it was felt that the patient should be evaluated in the office, they were directed there. The patient verbally consented to this visit. Patient was unable to complete an audio/visual visit due to Lack of equipment. Due to the catastrophic nature of the COVID-19 pandemic, this visit was done through audio contact only. . Location of the patient: home . Location of the provider: work . Those involved with this call:  . Provider: Marnee Guarneri, DNP . CMA: Yvonna Alanis, CMA . Front Desk/Registration: Jill Side  . Time spent on call: 15 minutes on the phone discussing health concerns. 10 minutes total spent in review of patient's record and preparation of their chart.  . I verified patient identity using two factors (patient name and date of birth). Patient consents verbally to being seen via telemedicine visit today.   HYPERTENSION / HYPERLIPIDEMIA Continues on Lisinopril, Amlodipine, and HCTZ + Atorvastatin. Satisfied with current treatment? yes Duration of hypertension: chronic BP monitoring frequency: daily BP range: 130/60-70 range BP medication side effects: no Duration of hyperlipidemia: chronic Cholesterol medication side effects: no Cholesterol supplements: none Medication compliance: good compliance Aspirin: yes Recent stressors: no Recurrent headaches: no Visual changes: no Palpitations: no Dyspnea: yes, at baseline Chest pain: no Lower extremity edema:  no Dizzy/lightheaded: no   COPD Quit smoking over 4 years ago.  Was followed by Dr. Alva Garnet, last saw 09/21/2018.  Only using Albuterol at this time, uses twice a day.  Has not gone for yearly lung CT and they have been trying to contact her to follow-up, has lung nodules to monitor. COPD status: stable Satisfied with current treatment?: yes Oxygen use: no Dyspnea frequency: at baseline Cough frequency: occasional Rescue inhaler frequency:  twice a day Limitation of activity: at times Productive cough: none Last Spirometry: 2019 Pneumovax: Up to Date Influenza: Up to Date   CHRONIC KIDNEY DISEASE Last August labs showed GFR 48 and CRT 1.27.  Has remained in this range for over a year. CKD status: stable Medications renally dose: yes Previous renal evaluation: no Pneumovax:  Up to Date Influenza Vaccine:  Up to Date   ANEMIA Followed by Dr. Mike Gip and last seen in August 2020, she is to return in 3 months for labs and then 6 months for follow-up, to continue oral supplement daily. Anemia status: stable Etiology of anemia: Duration of anemia treatment:  Compliance with treatment: good compliance Iron supplementation side effects: no Severity of anemia: mild Fatigue: no Decreased exercise tolerance: at times  Dyspnea on exertion: no Palpitations: no Bleeding: no Pica: no  Relevant past medical, surgical, family and social history reviewed and updated as indicated. Interim medical history since our last visit reviewed. Allergies and medications reviewed and updated.  Review of Systems  Constitutional: Negative for activity change, appetite change, diaphoresis, fatigue and fever.  Respiratory: Positive for shortness of breath (occasional,  at baseline) and wheezing (occasional). Negative for cough and chest tightness.   Cardiovascular: Negative for chest pain, palpitations and leg swelling.  Gastrointestinal: Negative.   Neurological: Negative.   Psychiatric/Behavioral:  Negative.     Per HPI unless specifically indicated above     Objective:    BP 139/65   Temp (!) 97.4 F (36.3 C) (Oral)   Wt 151 lb 12.8 oz (68.9 kg)   LMP  (LMP Unknown)   BMI 28.68 kg/m   Wt Readings from Last 3 Encounters:  10/07/19 151 lb 12.8 oz (68.9 kg)  07/04/19 155 lb (70.3 kg)  05/25/19 154 lb 15.7 oz (70.3 kg)    Physical Exam   Unable to perform due to telephone visit only.  Results for orders placed or performed in visit on 08/25/19  CBC with Differential  Result Value Ref Range   WBC 9.8 4.0 - 10.5 K/uL   RBC 4.40 3.87 - 5.11 MIL/uL   Hemoglobin 11.3 (L) 12.0 - 15.0 g/dL   HCT 37.8 36.0 - 46.0 %   MCV 85.9 80.0 - 100.0 fL   MCH 25.7 (L) 26.0 - 34.0 pg   MCHC 29.9 (L) 30.0 - 36.0 g/dL   RDW 14.6 11.5 - 15.5 %   Platelets 168 150 - 400 K/uL   nRBC 0.0 0.0 - 0.2 %   Neutrophils Relative % 70 %   Neutro Abs 6.9 1.7 - 7.7 K/uL   Lymphocytes Relative 18 %   Lymphs Abs 1.7 0.7 - 4.0 K/uL   Monocytes Relative 8 %   Monocytes Absolute 0.7 0.1 - 1.0 K/uL   Eosinophils Relative 3 %   Eosinophils Absolute 0.3 0.0 - 0.5 K/uL   Basophils Relative 1 %   Basophils Absolute 0.1 0.0 - 0.1 K/uL   Immature Granulocytes 0 %   Abs Immature Granulocytes 0.03 0.00 - 0.07 K/uL  Ferritin  Result Value Ref Range   Ferritin 76 11 - 307 ng/mL      Assessment & Plan:   Problem List Items Addressed This Visit      Cardiovascular and Mediastinum   Hypertension    Chronic, ongoing with BP at goal on home readings.  Continue current medication regimen and adjust as needed.  Recommend continue to monitor BP at home daily.  Will check outpatient labs: CMP.      Relevant Orders   Comprehensive metabolic panel     Respiratory   COPD, severe ([place]) - Primary    Chronic, ongoing.  Uses Albuterol frequently and no current maintenance.  Has not seen pulmonary in over a year, have recommended she return to visit with them.  Will add on Anoro for daily maintenance and then to  use Albuterol as needed only, discussed with her family member and her.  Recommend she scheduled annual lung CT scan for follow-up.  Return in 4 weeks for follow-up and spirometry,      Relevant Medications   albuterol (VENTOLIN HFA) 108 (90 Base) MCG/ACT inhaler   umeclidinium-vilanterol (ANORO ELLIPTA) 62.5-25 MCG/INH AEPB     Genitourinary   Chronic kidney disease, stage 3    Chronic, ongoing.  Stable at this time.  Will continue Lisinopril for kidney protection.  CMP outpatient.  Refer to nephrology if any decline.        Other   Hyperlipidemia    Chronic, ongoing.  Continue current medication regimen and adjust as needed  Will check outpatient labs: CMP and lipid panel.  Relevant Orders   Comprehensive metabolic panel   22m Lipid Panel   Nodule of upper lobe of left lung    Recommend to her that she schedule her follow-up annual lung CT, of note they have been trying to contact her.      Iron deficiency anemia    Chronic, ongoing.  Continue current supplements and collaboration with hematology.        B12 deficiency    Chronic, ongoing.  Continue current supplements and collaboration with hematology.  Recommend she maintain her follow-up visits with them.        I discussed the assessment and treatment plan with the patient. The patient was provided an opportunity to ask questions and all were answered. The patient agreed with the plan and demonstrated an understanding of the instructions.   The patient was advised to call back or seek an in-person evaluation if the symptoms worsen or if the condition fails to improve as anticipated.   I provided 15 minutes of time during this encounter.   Follow up plan: Return in about 4 weeks (around 11/04/2019) for COPD and spirometry in office.

## 2019-10-07 NOTE — Assessment & Plan Note (Signed)
Chronic, ongoing.  Continue current supplements and collaboration with hematology.

## 2019-10-07 NOTE — Assessment & Plan Note (Signed)
Chronic, ongoing.  Uses Albuterol frequently and no current maintenance.  Has not seen pulmonary in over a year, have recommended she return to visit with them.  Will add on Anoro for daily maintenance and then to use Albuterol as needed only, discussed with her family member and her.  Recommend she scheduled annual lung CT scan for follow-up.  Return in 4 weeks for follow-up and spirometry,

## 2019-10-07 NOTE — Patient Instructions (Signed)
COPD and Physical Activity °Chronic obstructive pulmonary disease (COPD) is a long-term (chronic) condition that affects the lungs. COPD is a general term that can be used to describe many different lung problems that cause lung swelling (inflammation) and limit airflow, including chronic bronchitis and emphysema. °The main symptom of COPD is shortness of breath, which makes it harder to do even simple tasks. This can also make it harder to exercise and be active. Talk with your health care provider about treatments to help you breathe better and actions you can take to prevent breathing problems during physical activity. °What are the benefits of exercising with COPD? °Exercising regularly is an important part of a healthy lifestyle. You can still exercise and do physical activities even though you have COPD. Exercise and physical activity improve your shortness of breath by increasing blood flow (circulation). This causes your heart to pump more oxygen through your body. Moderate exercise can improve your: °· Oxygen use. °· Energy level. °· Shortness of breath. °· Strength in your breathing muscles. °· Heart health. °· Sleep. °· Self-esteem and feelings of self-worth. °· Depression, stress, and anxiety levels. °Exercise can benefit everyone with COPD. The severity of your disease may affect how hard you can exercise, especially at first, but everyone can benefit. Talk with your health care provider about how much exercise is safe for you, and which activities and exercises are safe for you. °What actions can I take to prevent breathing problems during physical activity? °· Sign up for a pulmonary rehabilitation program. This type of program may include: °? Education about lung diseases. °? Exercise classes that teach you how to exercise and be more active while improving your breathing. This usually involves: °§ Exercise using your lower extremities, such as a stationary bicycle. °§ About 30 minutes of exercise, 2  to 5 times per week, for 6 to 12 weeks °§ Strength training, such as push ups or leg lifts. °? Nutrition education. °? Group classes in which you can talk with others who also have COPD and learn ways to manage stress. °· If you use an oxygen tank, you should use it while you exercise. Work with your health care provider to adjust your oxygen for your physical activity. Your resting flow rate is different from your flow rate during physical activity. °· While you are exercising: °? Take slow breaths. °? Pace yourself and do not try to go too fast. °? Purse your lips while breathing out. Pursing your lips is similar to a kissing or whistling position. °? If doing exercise that uses a quick burst of effort, such as weight lifting: °§ Breathe in before starting the exercise. °§ Breathe out during the hardest part of the exercise (such as raising the weights). °Where to find support °You can find support for exercising with COPD from: °· Your health care provider. °· A pulmonary rehabilitation program. °· Your local health department or community health programs. °· Support groups, online or in-person. Your health care provider may be able to recommend support groups. °Where to find more information °You can find more information about exercising with COPD from: °· American Lung Association: lung.org. °· COPD Foundation: copdfoundation.org. °Contact a health care provider if: °· Your symptoms get worse. °· You have chest pain. °· You have nausea. °· You have a fever. °· You have trouble talking or catching your breath. °· You want to start a new exercise program or a new activity. °Summary °· COPD is a general term that can   be used to describe many different lung problems that cause lung swelling (inflammation) and limit airflow. This includes chronic bronchitis and emphysema. °· Exercise and physical activity improve your shortness of breath by increasing blood flow (circulation). This causes your heart to provide more  oxygen to your body. °· Contact your health care provider before starting any exercise program or new activity. Ask your health care provider what exercises and activities are safe for you. °This information is not intended to replace advice given to you by your health care provider. Make sure you discuss any questions you have with your health care provider. °Document Released: 10/29/2017 Document Revised: 01/26/2019 Document Reviewed: 10/29/2017 °Elsevier Patient Education © 2020 [employer] ° °

## 2019-10-09 IMAGING — US US ABDOMEN LIMITED
1 series · 14 of 25 positions shown · non-contrast
Comparison: None.

CLINICAL DATA: Porcelain gallbladder.

EXAM:
ULTRASOUND ABDOMEN LIMITED RIGHT UPPER QUADRANT

[Series 1: us abdomen limited · 0.15mm/px · 14 of 65 slices shown]
[im 1/65]
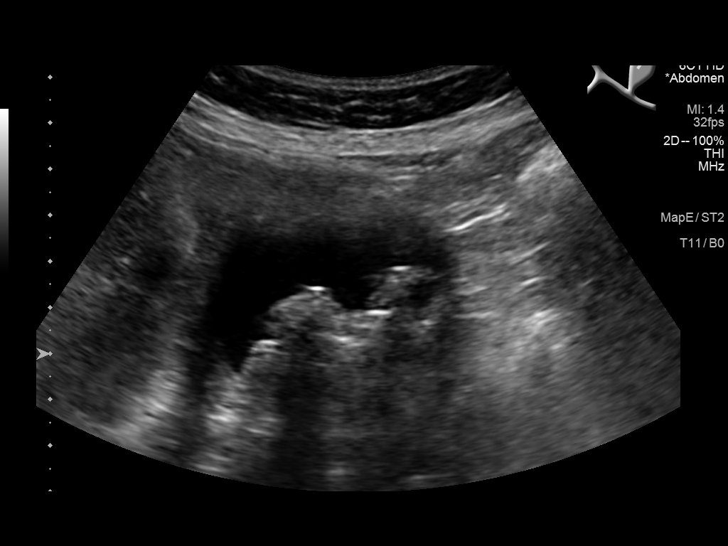
[im 6/65]
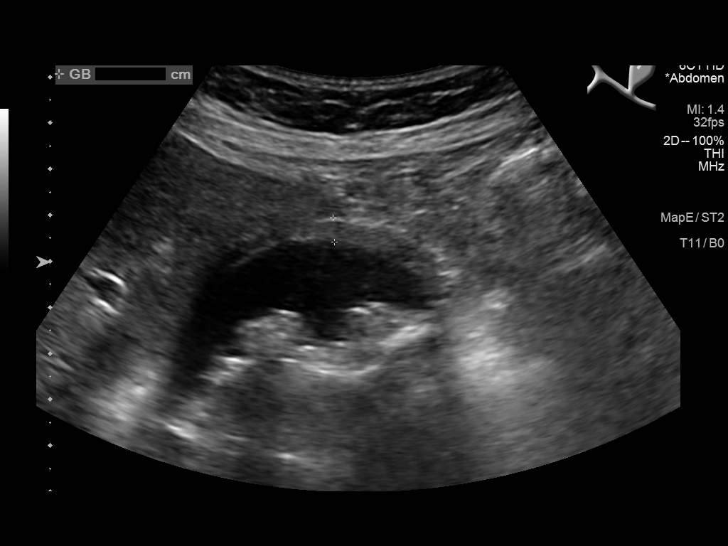
[im 11/65]
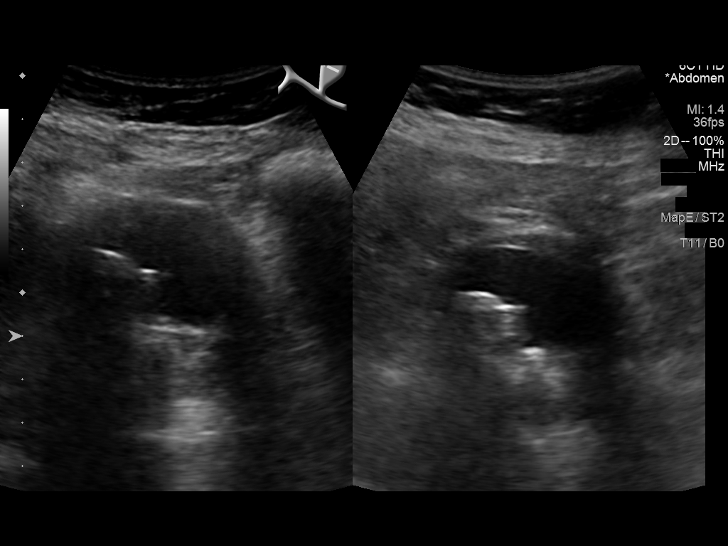
[im 17/65]
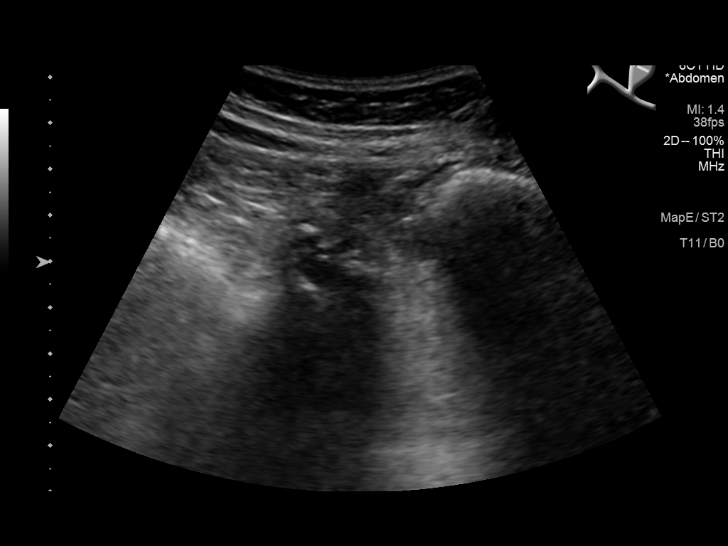
[im 22/65]
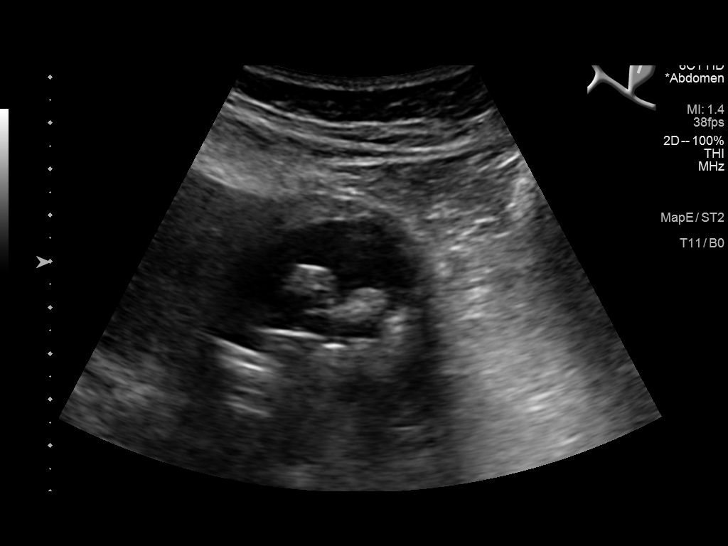
[im 25/65]
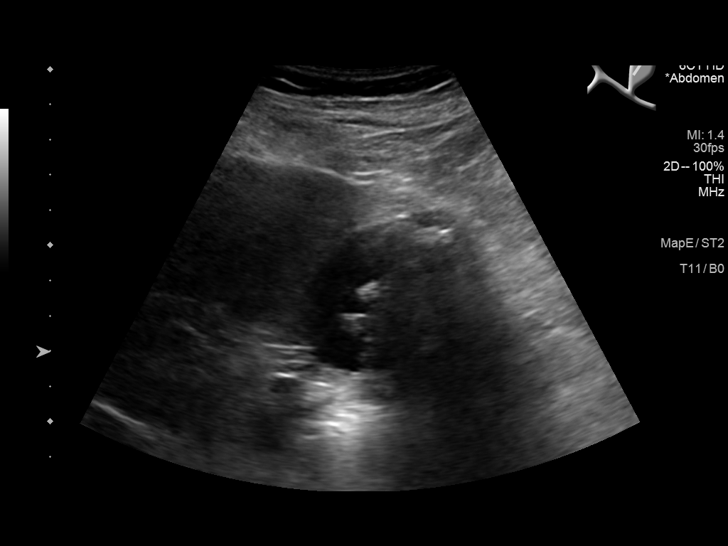
[im 30/65]
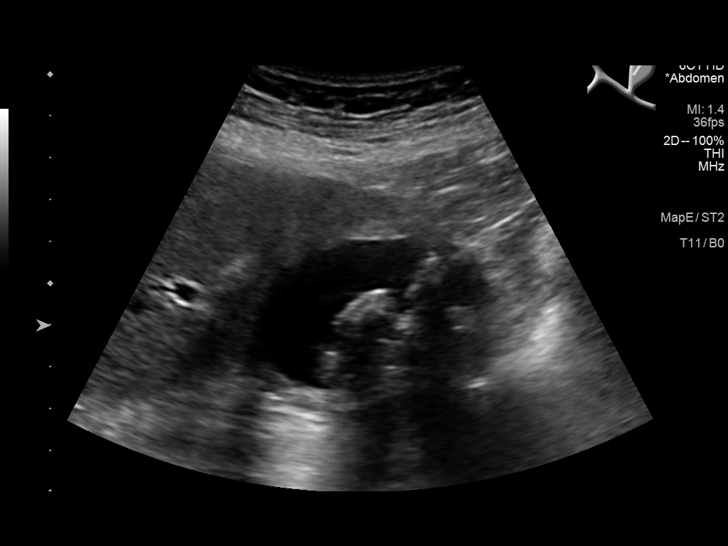
[im 35/65]
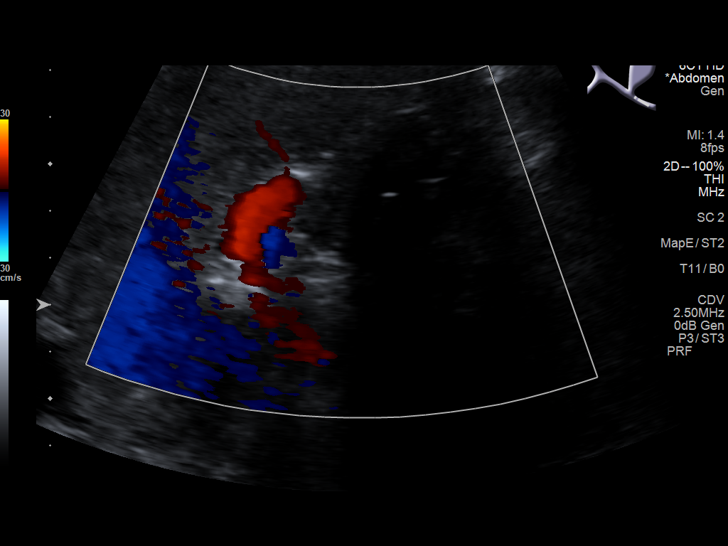
[im 41/65]
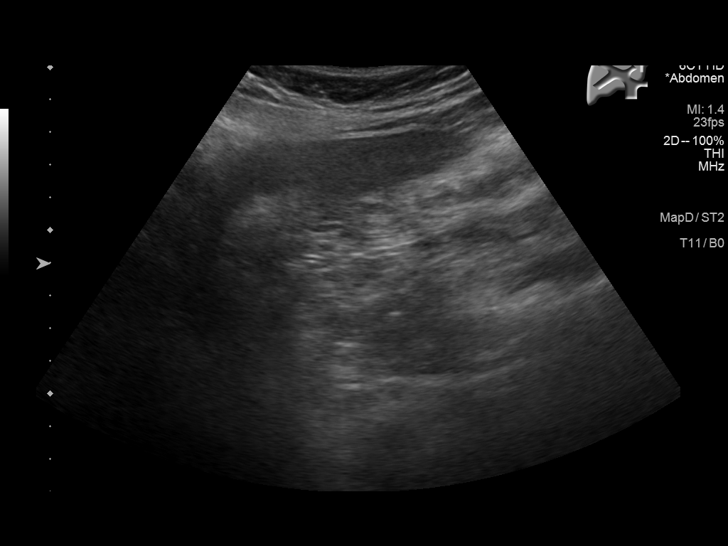
[im 43/65]
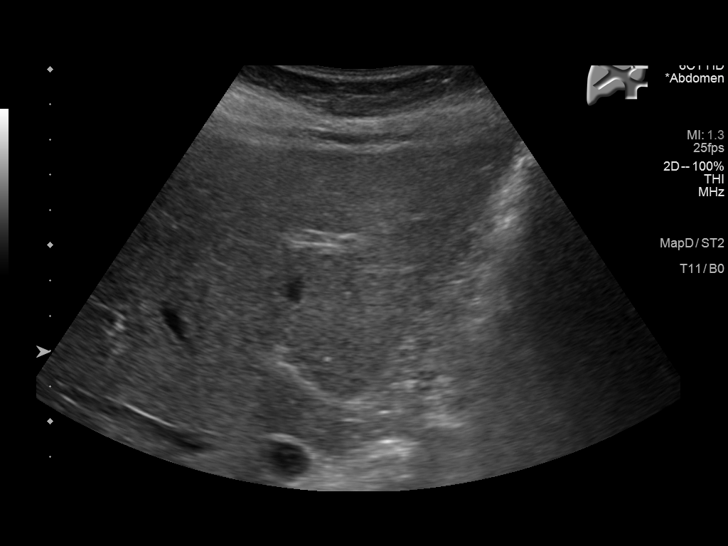
[im 49/65]
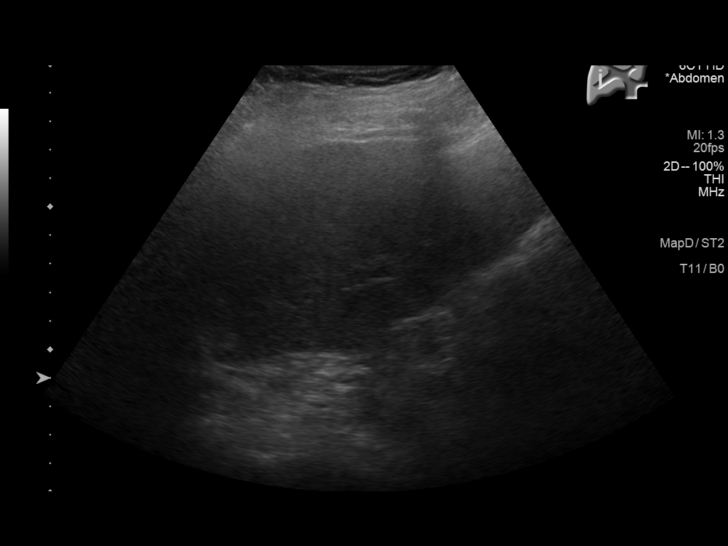
[im 54/65]
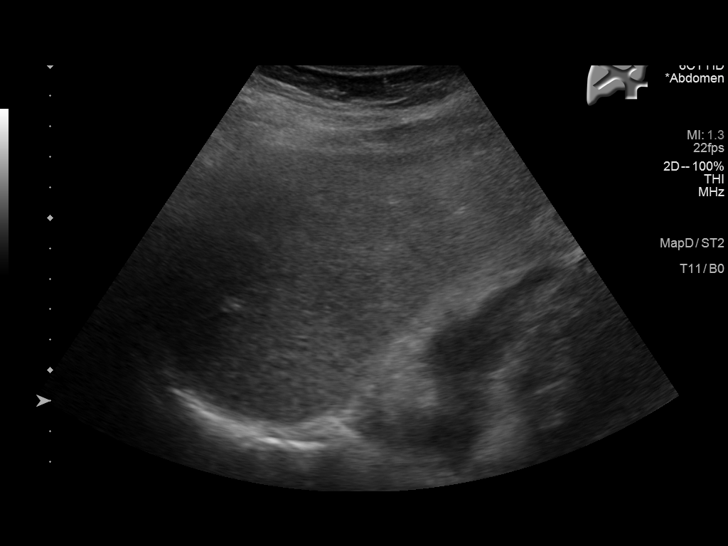
[im 59/65]
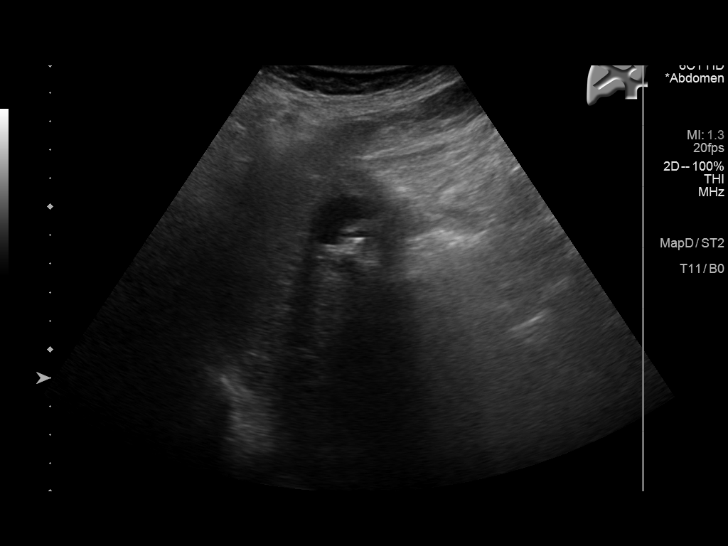
[im 65/65]
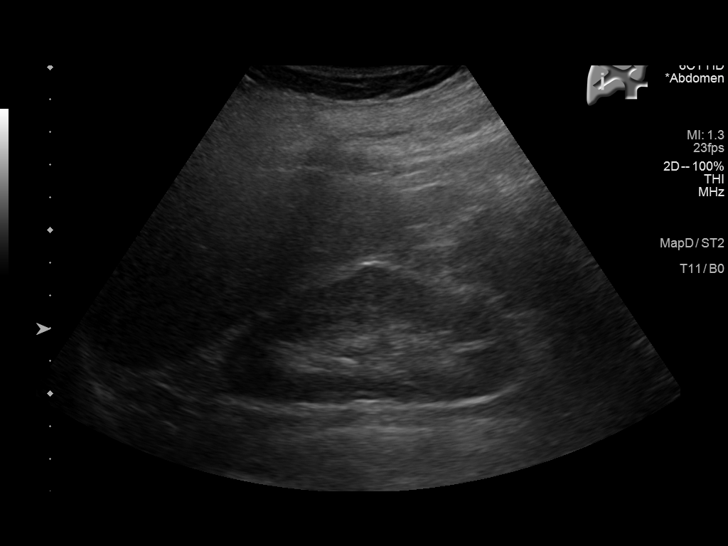

[14 of 25 positions shown; findings below may reference images not displayed]

FINDINGS: Gallbladder:

Cholelithiasis is noted as well as gallbladder wall calcifications
as noted on prior CT scan, consistent with porcelain gallbladder. No
sonographic Murphy's sign is noted. Focal wall thickening measuring
5 mm is noted.

Common bile duct:

Diameter: 4 mm which is within normal limits.

Liver:

No focal lesion identified. Within normal limits in parenchymal
echogenicity. Portal vein is patent on color Doppler imaging with
normal direction of blood flow towards the liver.

Other: None.
IMPRESSION: Gallbladder wall calcifications are noted with cholelithiasis and
gallbladder wall thickening as well. This is consistent with
porcelain gallbladder as noted on prior CT scan. If the patient is
symptomatic, prophylactic cholecystectomy is recommended.

## 2019-10-17 ENCOUNTER — Ambulatory Visit (INDEPENDENT_AMBULATORY_CARE_PROVIDER_SITE_OTHER): Payer: Medicare Other

## 2019-10-17 VITALS — BP 133/65 | Ht 61.0 in | Wt 151.8 lb

## 2019-10-17 DIAGNOSIS — Z0000 Encounter for general adult medical examination without abnormal findings: Secondary | ICD-10-CM

## 2019-10-17 DIAGNOSIS — Z1231 Encounter for screening mammogram for malignant neoplasm of breast: Secondary | ICD-10-CM

## 2019-10-17 NOTE — Progress Notes (Signed)
Subjective:   Madison Franklin is a 73 y.o. female who presents for Medicare Annual (Subsequent) preventive examination.  This visit is being conducted via phone call  - after an attmept to do on video chat - due to the COVID-19 pandemic. This patient has given me verbal consent via phone to conduct this visit, patient states they are participating from their home address. Some vital signs may be absent or patient reported.   Patient identification: identified by name, DOB, and current address.    Review of Systems:   Cardiac Risk Factors include: advanced age (>27men, >13 women);dyslipidemia;hypertension;smoking/ tobacco exposure     Objective:     Vitals: BP 133/65   Ht 5\' 1"  (1.549 m)   Wt 151 lb 12.8 oz (68.9 kg)   LMP  (LMP Unknown)   BMI 28.68 kg/m   Body mass index is 28.68 kg/m.  Advanced Directives 10/17/2019 05/25/2019 05/11/2019 11/24/2018 11/10/2018 10/14/2018 09/01/2018  Does Patient Have a Medical Advance Directive? No No No No No No No  Does patient want to make changes to medical advance directive? - - - - - - -  Would patient like information on creating a medical advance directive? - No - Patient declined Yes (MAU/Ambulatory/Procedural Areas - Information given) No - Patient declined No - Patient declined No - Patient declined No - Patient declined    Tobacco Social History   Tobacco Use  Smoking Status Former Smoker  . Packs/day: 0.50  . Years: 30.00  . Pack years: 15.00  . Types: Cigarettes  . Quit date: 01/17/2017  . Years since quitting: 2.7  Smokeless Tobacco Never Used     Counseling given: Not Answered   Clinical Intake:  Pre-visit preparation completed: Yes  Pain : No/denies pain     Nutritional Status: BMI 25 -29 Overweight Nutritional Risks: None Diabetes: No  How often do you need to have someone help you when you read instructions, pamphlets, or other written materials from your doctor or pharmacy?: 1 - Never  Interpreter Needed?:  No  Information entered by :: Nerida Boivin,LPN  Past Medical History:  Diagnosis Date  . COPD (chronic obstructive pulmonary disease) ([place])   . Dyspnea   . Hearing aid worn    bilateral  . Hyperlipidemia   . Hypertension   . Osteoporosis   . Personal history of tobacco use, presenting hazards to health 08/15/2015  . Pulmonary hypertension, mild ([facility])    mild to moderate   Past Surgical History:  Procedure Laterality Date  . ABDOMINAL HYSTERECTOMY    . COLONOSCOPY WITH PROPOFOL N/A 11/14/2016   Procedure: COLONOSCOPY WITH PROPOFOL;  Surgeon: [name], MD;  Location: [facility];  Service: Endoscopy;  Laterality: N/A;  . COLONOSCOPY WITH PROPOFOL N/A 11/10/2018   Procedure: COLONOSCOPY WITH PROPOFOL;  Surgeon: Lin Landsman, MD;  Location: [facility];  Service: Endoscopy;  Laterality: N/A;  . ESOPHAGOGASTRODUODENOSCOPY (EGD) WITH PROPOFOL  11/10/2018   Procedure: ESOPHAGOGASTRODUODENOSCOPY (EGD) WITH PROPOFOL;  Surgeon: Lin Landsman, MD;  Location: [facility];  Service: Endoscopy;;  . ESOPHAGOGASTRODUODENOSCOPY (EGD) WITH PROPOFOL N/A 05/11/2019   Procedure: ESOPHAGOGASTRODUODENOSCOPY (EGD) WITH PROPOFOL;  Surgeon: Lin Landsman, MD;  Location: [facility];  Service: Endoscopy;  Laterality: N/A;  . long nodule    . POLYPECTOMY  11/14/2016   Procedure: POLYPECTOMY;  Surgeon: [name], MD;  Location: [facility];  Service: Endoscopy;;  . POLYPECTOMY  11/10/2018   Procedure: POLYPECTOMY;  Surgeon: Sherri Sear  Reece Levy, MD;  Location: [facility];  Service: Endoscopy;;  . RIGHT HEART CATH Right 07/14/2017   Procedure: RIGHT HEART CATH;  Surgeon: Nelva Bush, MD;  Location: [facility];  Service: Cardiovascular;  Laterality: Right;   Family History  Problem Relation Age of Onset  . Heart disease Neg Hx    Social History   Socioeconomic History  . Marital status: Married    Spouse name: Not on file   . Number of children: Not on file  . Years of education: 71  . Highest education level: 12th grade  Occupational History  . Not on file  Tobacco Use  . Smoking status: Former Smoker    Packs/day: 0.50    Years: 30.00    Pack years: 15.00    Types: Cigarettes    Quit date: 01/17/2017    Years since quitting: 2.7  . Smokeless tobacco: Never Used  Substance and Sexual Activity  . Alcohol use: No    Alcohol/week: 0.0 standard drinks  . Drug use: No  . Sexual activity: Yes  Other Topics Concern  . Not on file  Social History Narrative  . Not on file   Social Determinants of Health   Financial Resource Strain:   . Difficulty of Paying Living Expenses: Not on file  Food Insecurity:   . Worried About Charity fundraiser in the Last Year: Not on file  . Ran Out of Food in the Last Year: Not on file  Transportation Needs:   . Lack of Transportation (Medical): Not on file  . Lack of Transportation (Non-Medical): Not on file  Physical Activity:   . Days of Exercise per Week: Not on file  . Minutes of Exercise per Session: Not on file  Stress:   . Feeling of Stress : Not on file  Social Connections:   . Frequency of Communication with Friends and Family: Not on file  . Frequency of Social Gatherings with Friends and Family: Not on file  . Attends Religious Services: Not on file  . Active Member of Clubs or Organizations: Not on file  . Attends Archivist Meetings: Not on file  . Marital Status: Not on file    Outpatient Encounter Medications as of 10/17/2019  Medication Sig  . acetaminophen (TYLENOL) 325 MG tablet Take 325 mg by mouth every 6 (six) hours as needed (for pain.).  Marland Kitchen albuterol (VENTOLIN HFA) 108 (90 Base) MCG/ACT inhaler Inhale 2 puffs into the lungs every 6 (six) hours as needed for wheezing or shortness of breath.  Marland Kitchen amLODipine (NORVASC) 5 MG tablet Take 1 tablet (5 mg total) by mouth daily.  Marland Kitchen aspirin EC 81 MG tablet Take 81 mg by mouth daily.  Marland Kitchen  atorvastatin (LIPITOR) 10 MG tablet Take 1 tablet (10 mg total) by mouth daily.  . hydrochlorothiazide (MICROZIDE) 12.5 MG capsule Take 1 capsule (12.5 mg total) by mouth daily.  Marland Kitchen lisinopril (ZESTRIL) 40 MG tablet Take 1 tablet (40 mg total) by mouth daily.  Marland Kitchen umeclidinium-vilanterol (ANORO ELLIPTA) 62.5-25 MCG/INH AEPB Inhale 1 puff into the lungs daily.  . vitamin B-12 (CYANOCOBALAMIN) 1000 MCG tablet Take 1,000 mcg by mouth daily.  . Vitamin D, Cholecalciferol, 1000 units TABS Take 1 tablet by mouth daily.   No facility-administered encounter medications on file as of 10/17/2019.    Activities of Daily Living In your present state of health, do you have any difficulty performing the following activities: 10/17/2019 05/11/2019  Hearing? Tempie Donning  Comment hearing aids -  Vision? N N  Comment reading glasses -  Difficulty concentrating or making decisions? N N  Walking or climbing stairs? Y N  Dressing or bathing? N N  Doing errands, shopping? N -  Preparing Food and eating ? N -  Using the Toilet? N -  In the past six months, have you accidently leaked urine? N -  Do you have problems with loss of bowel control? N -  Managing your Medications? N -  Managing your Finances? N -  Housekeeping or managing your Housekeeping? N -  Some recent data might be hidden    Patient Care Team: Venita Lick, NP as PCP - General (Nurse Practitioner) [name], MD as Consulting Physician (Gastroenterology) Anthonette Legato, MD (Internal Medicine) Jules Husbands, MD as Consulting Physician (General Surgery) Minor, Dalbert Garnet, RN as [facility]    Assessment:   This is a routine wellness examination for [place].  Exercise Activities and Dietary recommendations Current Exercise Habits: Home exercise routine, Type of exercise: stretching, Time (Minutes): 10, Frequency (Times/Week): 4, Weekly Exercise (Minutes/Week): 40, Intensity: Mild, Exercise limited by: None  identified  Goals    . DIET - INCREASE WATER INTAKE     Recommend drinking at least 6-8 glasses of water a day       Fall Risk: Fall Risk  10/17/2019 07/04/2019 12/01/2018 10/14/2018 09/13/2018  Falls in the past year? 0 0 0 0 0  Number falls in past yr: 0 0 0 - -  Injury with Fall? 0 0 - - -  Follow up - - Falls evaluation completed - -    FALL RISK PREVENTION PERTAINING TO THE HOME:  Any stairs in or around the home? Yes  If so, are there any without handrails? No   Home free of loose throw rugs in walkways, pet beds, electrical cords, etc? Yes  Adequate lighting in your home to reduce risk of falls? Yes   ASSISTIVE DEVICES UTILIZED TO PREVENT FALLS:  Life alert? No  Use of a cane, walker or w/c? No  Grab bars in the bathroom? No  Shower chair or bench in shower? No  Elevated toilet seat or a handicapped toilet? No   DME ORDERS:  DME order needed?  No   TIMED UP AND GO:  Unable to perform    Depression Screen PHQ 2/9 Scores 10/17/2019 10/14/2018 10/05/2017 09/30/2017  PHQ - 2 Score 0 0 0 0     Cognitive Function     6CIT Screen 10/14/2018 09/30/2017  What Year? 0 points 0 points  What month? 0 points 0 points  What time? 0 points 0 points  Count back from 20 0 points 0 points  Months in reverse 2 points 0 points  Repeat phrase 10 points 4 points  Total Score 12 4    Immunization History  Administered Date(s) Administered  . Influenza, High Dose Seasonal PF 09/03/2016  . Influenza,inj,Quad PF,6+ Mos 07/27/2015  . Influenza,inj,quad, With Preservative 08/24/2018, 07/15/2019  . Influenza-Unspecified 08/14/2017, 08/24/2018  . Pneumococcal Conjugate-13 01/12/2015  . Pneumococcal Polysaccharide-23 07/01/2011  . Td 07/27/2015    Qualifies for Shingles Vaccine? Yes  Zostavax completed n/a. Due for Shingrix. Education has been provided regarding the importance of this vaccine. Pt has been advised to call insurance company to determine out of pocket  expense. Advised may also receive vaccine at local pharmacy or Health Dept. Verbalized acceptance and understanding.  Tdap: up to date  Flu  Vaccine: up to date   Pneumococcal Vaccine: up to date  Screening Tests Health Maintenance  Topic Date Due  . MAMMOGRAM  11/22/1994  . COLONOSCOPY  11/10/2021  . TETANUS/TDAP  07/26/2025  . INFLUENZA VACCINE  Completed  . DEXA SCAN  Completed  . Hepatitis C Screening  Completed  . PNA vac Low Risk Adult  Completed    Cancer Screenings:  Colorectal Screening: Completed 11/10/2018. Repeat every 3 years  Mammogram:  Pt provided with contact info and advised to call to schedule appt  Bone Density: Completed 11/23/2012  Lung Cancer Screening: (Low Dose CT Chest recommended if Age 92-80 years, 30 pack-year currently smoking OR have quit w/in 15years.) does qualify.   Last completed 06/01/2018  Additional Screening:  Hepatitis C Screening: does qualify; Completed 07/27/2015  Vision Screening: Recommended annual ophthalmology exams for early detection of glaucoma and other disorders of the eye. Is the patient up to date with their annual eye exam?  no    Dental Screening: Recommended annual dental exams for proper oral hygiene  Community Resource Referral:  CRR required this visit?  No       Plan:  I have personally reviewed and addressed the Medicare Annual Wellness questionnaire and have noted the following in the patient's chart:  A. Medical and social history B. Use of alcohol, tobacco or illicit drugs  C. Current medications and supplements D. Functional ability and status E.  Nutritional status F.  Physical activity G. Advance directives H. List of other physicians I.  Hospitalizations, surgeries, and ER visits in previous 12 months J.  [place] such as hearing and vision if needed, cognitive and depression L. Referrals and appointments   In addition, I have reviewed and discussed with patient certain preventive  protocols, quality metrics, and best practice recommendations. A written personalized care plan for preventive services as well as general preventive health recommendations were provided to patient.  Signed,    Bevelyn Ngo, LPN  [id number] Nurse Health Advisor   Nurse Notes: none

## 2019-10-17 NOTE — Patient Instructions (Signed)
Madison Franklin , Thank you for taking time to come for your Medicare Wellness Visit. I appreciate your ongoing commitment to your health goals. Please review the following plan we discussed and let me know if I can assist you in the future.   Screening recommendations/referrals: Colonoscopy: completed 11/10/2018 Mammogram: due now Please call [phone] to schedule your mammogram.  Bone Density: completed 11/23/2012 Recommended yearly ophthalmology/optometry visit for glaucoma screening and checkup Recommended yearly dental visit for hygiene and checkup  Vaccinations: Influenza vaccine: up to date Pneumococcal vaccine: up to date Tdap vaccine: up to date Shingles vaccine: shingrix eligible     Advanced directives: Advance directive discussed with you today. Once this is complete please bring a copy in to our office so we can scan it into your chart.  Conditions/risks identified: history of smoking.   Next appointment: Follow up in one year for your annual wellness visit    Preventive Care 65 Years and Older, Female Preventive care refers to lifestyle choices and visits with your health care provider that can promote health and wellness. What does preventive care include?  A yearly physical exam. This is also called an annual well check.  Dental exams once or twice a year.  Routine eye exams. Ask your health care provider how often you should have your eyes checked.  Personal lifestyle choices, including:  Daily care of your teeth and gums.  Regular physical activity.  Eating a healthy diet.  Avoiding tobacco and drug use.  Limiting alcohol use.  Practicing safe sex.  Taking low-dose aspirin every day.  Taking vitamin and mineral supplements as recommended by your health care provider. What happens during an annual well check? The services and screenings done by your health care provider during your annual well check will depend on your age, overall health, lifestyle  risk factors, and family history of disease. Counseling  Your health care provider may ask you questions about your:  Alcohol use.  Tobacco use.  Drug use.  Emotional well-being.  Home and relationship well-being.  Sexual activity.  Eating habits.  History of falls.  Memory and ability to understand (cognition).  Work and work Statistician.  Reproductive health. Screening  You may have the following tests or measurements:  Height, weight, and BMI.  Blood pressure.  Lipid and cholesterol levels. These may be checked every 5 years, or more frequently if you are over 50 years old.  Skin check.  Lung cancer screening. You may have this screening every year starting at age 74 if you have a 30-pack-year history of smoking and currently smoke or have quit within the past 15 years.  Fecal occult blood test (FOBT) of the stool. You may have this test every year starting at age 74.  Flexible sigmoidoscopy or colonoscopy. You may have a sigmoidoscopy every 5 years or a colonoscopy every 10 years starting at age 74.  Hepatitis C blood test.  Hepatitis B blood test.  Sexually transmitted disease (STD) testing.  Diabetes screening. This is done by checking your blood sugar (glucose) after you have not eaten for a while (fasting). You may have this done every 1-3 years.  Bone density scan. This is done to screen for osteoporosis. You may have this done starting at age 74.  Mammogram. This may be done every 1-2 years. Talk to your health care provider about how often you should have regular mammograms. Talk with your health care provider about your test results, treatment options, and if necessary, the need  for more tests. Vaccines  Your health care provider may recommend certain vaccines, such as:  Influenza vaccine. This is recommended every year.  Tetanus, diphtheria, and acellular pertussis (Tdap, Td) vaccine. You may need a Td booster every 10 years.  Zoster vaccine.  You may need this after age 74.  Pneumococcal 13-valent conjugate (PCV13) vaccine. One dose is recommended after age 74.  Pneumococcal polysaccharide (PPSV23) vaccine. One dose is recommended after age 74. Talk to your health care provider about which screenings and vaccines you need and how often you need them. This information is not intended to replace advice given to you by your health care provider. Make sure you discuss any questions you have with your health care provider. Document Released: 11/02/2015 Document Revised: 06/25/2016 Document Reviewed: 08/07/2015 Elsevier Interactive Patient Education  2017 [employer] Prevention in the Home Falls can cause injuries. They can happen to people of all ages. There are many things you can do to make your home safe and to help prevent falls. What can I do on the outside of my home?  Regularly fix the edges of walkways and driveways and fix any cracks.  Remove anything that might make you trip as you walk through a door, such as a raised step or threshold.  Trim any bushes or trees on the path to your home.  Use bright outdoor lighting.  Clear any walking paths of anything that might make someone trip, such as rocks or tools.  Regularly check to see if handrails are loose or broken. Make sure that both sides of any steps have handrails.  Any raised decks and porches should have guardrails on the edges.  Have any leaves, snow, or ice cleared regularly.  Use sand or salt on walking paths during winter.  Clean up any spills in your garage right away. This includes oil or grease spills. What can I do in the bathroom?  Use night lights.  Install grab bars by the toilet and in the tub and shower. Do not use towel bars as grab bars.  Use non-skid mats or decals in the tub or shower.  If you need to sit down in the shower, use a plastic, non-slip stool.  Keep the floor dry. Clean up any water that spills on the floor as soon  as it happens.  Remove soap buildup in the tub or shower regularly.  Attach bath mats securely with double-sided non-slip rug tape.  Do not have throw rugs and other things on the floor that can make you trip. What can I do in the bedroom?  Use night lights.  Make sure that you have a light by your bed that is easy to reach.  Do not use any sheets or blankets that are too big for your bed. They should not hang down onto the floor.  Have a firm chair that has side arms. You can use this for support while you get dressed.  Do not have throw rugs and other things on the floor that can make you trip. What can I do in the kitchen?  Clean up any spills right away.  Avoid walking on wet floors.  Keep items that you use a lot in easy-to-reach places.  If you need to reach something above you, use a strong step stool that has a grab bar.  Keep electrical cords out of the way.  Do not use floor polish or wax that makes floors slippery. If you must use wax, use  non-skid floor wax.  Do not have throw rugs and other things on the floor that can make you trip. What can I do with my stairs?  Do not leave any items on the stairs.  Make sure that there are handrails on both sides of the stairs and use them. Fix handrails that are broken or loose. Make sure that handrails are as long as the stairways.  Check any carpeting to make sure that it is firmly attached to the stairs. Fix any carpet that is loose or worn.  Avoid having throw rugs at the top or bottom of the stairs. If you do have throw rugs, attach them to the floor with carpet tape.  Make sure that you have a light switch at the top of the stairs and the bottom of the stairs. If you do not have them, ask someone to add them for you. What else can I do to help prevent falls?  Wear shoes that:  Do not have high heels.  Have rubber bottoms.  Are comfortable and fit you well.  Are closed at the toe. Do not wear sandals.  If  you use a stepladder:  Make sure that it is fully opened. Do not climb a closed stepladder.  Make sure that both sides of the stepladder are locked into place.  Ask someone to hold it for you, if possible.  Clearly mark and make sure that you can see:  Any grab bars or handrails.  First and last steps.  Where the edge of each step is.  Use tools that help you move around (mobility aids) if they are needed. These include:  Canes.  Walkers.  Scooters.  Crutches.  Turn on the lights when you go into a dark area. Replace any light bulbs as soon as they burn out.  Set up your furniture so you have a clear path. Avoid moving your furniture around.  If any of your floors are uneven, fix them.  If there are any pets around you, be aware of where they are.  Review your medicines with your doctor. Some medicines can make you feel dizzy. This can increase your chance of falling. Ask your doctor what other things that you can do to help prevent falls. This information is not intended to replace advice given to you by your health care provider. Make sure you discuss any questions you have with your health care provider. Document Released: 08/02/2009 Document Revised: 03/13/2016 Document Reviewed: 11/10/2014 Elsevier Interactive Patient Education  2017 [employer].

## 2019-10-25 ENCOUNTER — Telehealth: Payer: Self-pay | Admitting: Nurse Practitioner

## 2019-10-25 NOTE — Chronic Care Management (AMB) (Signed)
  Care Management   Note  10/25/2019 Name: Madison Franklin MRN: [id number] DOB: 05/01/1946  Madison Franklin is a 74 y.o. year old female who is a primary care patient of Venita Lick, NP and is actively engaged with the care management team. I reached out to [name] by phone today to assist with re-scheduling a initial outreach appointment with the RN Case Manager  Follow up plan: Telephone appointment with care management team member scheduled for: 11/30/2019  [place], [place] [facility]  [place], [place] [address] Direct Dial: [email or web address]  Website: [email or web address]

## 2019-11-08 ENCOUNTER — Telehealth: Payer: Medicare Other

## 2019-11-15 ENCOUNTER — Other Ambulatory Visit: Payer: Self-pay | Admitting: Nurse Practitioner

## 2019-11-15 DIAGNOSIS — F17200 Nicotine dependence, unspecified, uncomplicated: Secondary | ICD-10-CM

## 2019-11-15 NOTE — Telephone Encounter (Signed)
Requested Prescriptions  Pending Prescriptions Disp Refills  . amLODipine (NORVASC) 5 MG tablet [Pharmacy Med Name: AMLODIPINE BESYLATE 5 MG TAB] 90 tablet 0    Sig: Take 1 tablet (5 mg total) by mouth daily.     Cardiovascular:  Calcium Channel Blockers Passed - 11/15/2019 10:11 AM      Passed - Last BP in normal range    BP Readings from Last 1 Encounters:  10/17/19 133/65         Passed - Valid encounter within last 6 months    Recent Outpatient Visits          1 month ago COPD, severe ([place])   [place], Sheldon T, NP   1 year ago Colon cancer screening   [place] [name], NP   1 year ago Chronic kidney disease, stage 3 ([place])   [place] [name], NP   2 years ago Mixed hyperlipidemia   [facility] [name], NP   2 years ago COPD, severe ([place])   [place] [name], NP      Future Appointments            In 10 months [facility], [place]

## 2019-11-25 ENCOUNTER — Telehealth: Payer: Self-pay | Admitting: Nurse Practitioner

## 2019-11-25 ENCOUNTER — Encounter: Payer: Self-pay | Admitting: Nurse Practitioner

## 2019-11-25 NOTE — Telephone Encounter (Signed)
Patient needs to be seen in ER ASAP, questions seizure activity vs infection.

## 2019-11-25 NOTE — Telephone Encounter (Signed)
Copied from [place] [id number]. Topic: Appointment Scheduling - Scheduling Inquiry for Clinic >> Nov 25, 2019 12:42 PM Greggory Keen D wrote: Reason for CRM: Pt's daughter Cathlean Sauer called asking if they can get mom in asap.  Her appetite has been really low.  She has been having foam coming out of her mouth.  Sleeping a lot more lately  CB#  [phone]

## 2019-11-25 NOTE — Telephone Encounter (Signed)
Pts daughter [name] stated that she will take her to the ED.

## 2019-11-27 NOTE — Progress Notes (Signed)
[facility]  [address] [place], [place] [address] Phone: [phone]  Fax: [phone]   Clinic Day:  11/30/2019  Referring physician: Venita Lick, NP  Chief Complaint: Madison Franklin is a 74 y.o. female with anemia of chronic kidney disease, iron deficiency, and B12 deficiency  who is seen for 6 month assessment.  HPI: The patient was last seen in the hematology clinic on 05/25/2019. At that time, she noted shortness of breath on exertion. She had a palpable gallbladder.  Hemoglobin was 12. Ferritin was 86, B-12 was 462.  Abdomen ultrasound on 06/28/2019 showed gallbladder wall calcifications with cholelithiasis and gallbladder wall thickening of 5 mm c/w porcelain gallbladder.  She was seen follow up of porcelain gallbladder with Dr. Dahlia Byes on 07/04/2019. She was doing better and able to walk further.  She was to follow up in 1 year.   She was seen for follow up of COPD via telephone by Marnee Guarneri, NP on 10/07/2019. She was given Anoro elliptra 62.5-25 mcg/inh. She was to follow up in 1 month.  Labs on 08/25/2019 revealed a hematocrit 37.8, hemoglobin 11.3, MCV 85.9, platelets 168,000, WBC 9800, ANC 6900.  Ferritin 76.  During the interim, she is felt alright.  She notes being tired and having no energy.  She notes a little congestion and is on Mucinex.  She denies any fever.  She has shortness of breath with walking.  She states that she has no plan to do anything about her gallbladder unless she has problems.  She is taking oral B12.  She stopped taking oral iron "a while back".   Past Medical History:  Diagnosis Date  . COPD (chronic obstructive pulmonary disease) ([place])   . Dyspnea   . Hearing aid worn    bilateral  . Hyperlipidemia   . Hypertension   . Osteoporosis   . Personal history of tobacco use, presenting hazards to health 08/15/2015  . Pulmonary hypertension, mild ([facility])    mild to moderate    Past Surgical History:    Procedure Laterality Date  . ABDOMINAL HYSTERECTOMY    . COLONOSCOPY WITH PROPOFOL N/A 11/14/2016   Procedure: COLONOSCOPY WITH PROPOFOL;  Surgeon: [name], MD;  Location: [facility];  Service: Endoscopy;  Laterality: N/A;  . COLONOSCOPY WITH PROPOFOL N/A 11/10/2018   Procedure: COLONOSCOPY WITH PROPOFOL;  Surgeon: Lin Landsman, MD;  Location: [facility];  Service: Endoscopy;  Laterality: N/A;  . ESOPHAGOGASTRODUODENOSCOPY (EGD) WITH PROPOFOL  11/10/2018   Procedure: ESOPHAGOGASTRODUODENOSCOPY (EGD) WITH PROPOFOL;  Surgeon: Lin Landsman, MD;  Location: [facility];  Service: Endoscopy;;  . ESOPHAGOGASTRODUODENOSCOPY (EGD) WITH PROPOFOL N/A 05/11/2019   Procedure: ESOPHAGOGASTRODUODENOSCOPY (EGD) WITH PROPOFOL;  Surgeon: Lin Landsman, MD;  Location: [facility];  Service: Endoscopy;  Laterality: N/A;  . long nodule    . POLYPECTOMY  11/14/2016   Procedure: POLYPECTOMY;  Surgeon: [name], MD;  Location: [facility];  Service: Endoscopy;;  . POLYPECTOMY  11/10/2018   Procedure: POLYPECTOMY;  Surgeon: Lin Landsman, MD;  Location: [facility];  Service: Endoscopy;;  . RIGHT HEART CATH Right 07/14/2017   Procedure: RIGHT HEART CATH;  Surgeon: Nelva Bush, MD;  Location: [facility];  Service: Cardiovascular;  Laterality: Right;    Family History  Problem Relation Age of Onset  . Heart disease Neg Hx     Social History:  reports that she quit smoking about 2 years ago. Her smoking  use included cigarettes. She has a 15.00 pack-year smoking history. She has never used smokeless tobacco. She reports that she does not drink alcohol or use drugs. She smoked 1/2 pack/day x 30 years. She quit in 2018. She is a retired Secretary/administrator. Her husband's name is Elenore Rota. Her daughter Levada Dy can be reached over the phone [phone]). The patient is alone today.  Allergies:  Allergies  Allergen Reactions  . Latex  Itching and Rash    With latex gloves (when worn)    Current Medications: Current Outpatient Medications  Medication Sig Dispense Refill  . acetaminophen (TYLENOL) 325 MG tablet Take 325 mg by mouth every 6 (six) hours as needed (for pain.).    Marland Kitchen albuterol (VENTOLIN HFA) 108 (90 Base) MCG/ACT inhaler Inhale 2 puffs into the lungs every 6 (six) hours as needed for wheezing or shortness of breath. 16 g 3  . amLODipine (NORVASC) 5 MG tablet Take 1 tablet (5 mg total) by mouth daily. 90 tablet 0  . aspirin EC 81 MG tablet Take 81 mg by mouth daily.    Marland Kitchen atorvastatin (LIPITOR) 10 MG tablet Take 1 tablet (10 mg total) by mouth daily. 90 tablet 0  . hydrochlorothiazide (MICROZIDE) 12.5 MG capsule Take 1 capsule (12.5 mg total) by mouth daily. 90 capsule 0  . lisinopril (ZESTRIL) 40 MG tablet Take 1 tablet (40 mg total) by mouth daily. 90 tablet 0  . umeclidinium-vilanterol (ANORO ELLIPTA) 62.5-25 MCG/INH AEPB Inhale 1 puff into the lungs daily. 3 each 6  . vitamin B-12 (CYANOCOBALAMIN) 1000 MCG tablet Take 1,000 mcg by mouth daily.    . Vitamin D, Cholecalciferol, 1000 units TABS Take 1 tablet by mouth daily.     No current facility-administered medications for this visit.    Review of Systems  Constitutional: Positive for weight loss (4 pounds in 6 months). Negative for chills, diaphoresis, fever and malaise/fatigue.       Feels "alright".  HENT: Positive for hearing loss. Negative for congestion, nosebleeds, sinus pain and sore throat.   Eyes: Negative.  Negative for blurred vision, double vision and photophobia.  Respiratory: Positive for shortness of breath (with walking at times). Negative for cough, sputum production and wheezing.   Cardiovascular: Negative.  Negative for chest pain, palpitations, orthopnea, leg swelling and PND.  Gastrointestinal: Negative.  Negative for abdominal pain, blood in stool, constipation, diarrhea, melena, nausea and vomiting.  Genitourinary: Negative.   Negative for dysuria, frequency, hematuria and urgency.       CKD-III.  Musculoskeletal: Negative.  Negative for back pain, joint pain and myalgias.  Skin: Negative.  Negative for rash.  Neurological: Negative.  Negative for dizziness, tingling, sensory change, weakness and headaches.  Endo/Heme/Allergies: Negative.  Does not bruise/bleed easily.  Psychiatric/Behavioral: Negative.  Negative for depression and memory loss. The patient is not nervous/anxious and does not have insomnia.   All other systems reviewed and are negative.  Performance status (ECOG):  1  Vitals Blood pressure (!) 134/53, pulse 87, temperature 98.2 F (36.8 C), temperature source Tympanic, resp. rate 18, height 5\' 1"  (1.549 m), weight 150 lb 0.4 oz (68.1 kg), SpO2 93 %.   Physical Exam  Constitutional: She is oriented to person, place, and time. She appears well-developed and well-nourished. No distress.  HENT:  Head: Normocephalic.  Mouth/Throat: Oropharynx is clear and moist. No oropharyngeal exudate.  Graying hair.  Mask.  Eyes: Pupils are equal, round, and reactive to light. Conjunctivae and lids are normal. Right eye exhibits no discharge.  Left eye exhibits no discharge. No scleral icterus.  Neck: No JVD present. Carotid bruit is not present.  Cardiovascular: Normal rate and regular rhythm.  Murmur (3/6) heard. Pulmonary/Chest: Effort normal and breath sounds normal. No respiratory distress. She has no wheezes. She has no rales.  Abdominal: Soft. Normal appearance and bowel sounds are normal. She exhibits no distension. There is no splenomegaly or hepatomegaly. There is no abdominal tenderness. There is no rebound.  Fully round.  Ballotable palpable gallbladder.  Musculoskeletal:        General: No edema. Normal range of motion.     Cervical back: Normal range of motion.  Lymphadenopathy:       Head (right side): No preauricular and no posterior auricular adenopathy present.       Head (left side): No  preauricular, no posterior auricular and no occipital adenopathy present.    She has no cervical adenopathy.    She has no axillary adenopathy.       Right: No inguinal and no supraclavicular adenopathy present.       Left: No inguinal and no supraclavicular adenopathy present.  Neurological: She is alert and oriented to person, place, and time.  Skin: Skin is warm, dry and intact. No rash noted. She is not diaphoretic. No erythema. No pallor.  Psychiatric: She has a normal mood and affect. Her behavior is normal. Judgment and thought content normal.  Nursing note and vitals reviewed.   Appointment on 11/30/2019  Component Date Value Ref Range Status  . Sodium 11/30/2019 136  135 - 145 mmol/L Final  . Potassium 11/30/2019 4.1  3.5 - 5.1 mmol/L Final  . Chloride 11/30/2019 103  98 - 111 mmol/L Final  . CO2 11/30/2019 22  22 - 32 mmol/L Final  . Glucose, Bld 11/30/2019 144* 70 - 99 mg/dL Final  . BUN 11/30/2019 37* 8 - 23 mg/dL Final  . Creatinine, Ser 11/30/2019 1.43* 0.44 - 1.00 mg/dL Final  . Calcium 11/30/2019 9.3  8.9 - 10.3 mg/dL Final  . GFR calc non Af Amer 11/30/2019 36* >60 mL/min Final  . GFR calc Af Amer 11/30/2019 41* >60 mL/min Final  . Anion gap 11/30/2019 11  5 - 15 Final   Performed at [facility], [address], [place], [place] [address]  . WBC 11/30/2019 8.6  4.0 - 10.5 K/uL Final  . RBC 11/30/2019 4.32  3.87 - 5.11 MIL/uL Final  . Hemoglobin 11/30/2019 10.8* 12.0 - 15.0 g/dL Final  . HCT 11/30/2019 35.7* 36.0 - 46.0 % Final  . MCV 11/30/2019 82.6  80.0 - 100.0 fL Final  . MCH 11/30/2019 25.0* 26.0 - 34.0 pg Final  . MCHC 11/30/2019 30.3  30.0 - 36.0 g/dL Final  . RDW 11/30/2019 14.3  11.5 - 15.5 % Final  . Platelets 11/30/2019 276  150 - 400 K/uL Final  . nRBC 11/30/2019 0.0  0.0 - 0.2 % Final  . Neutrophils Relative % 11/30/2019 74  % Final  . Neutro Abs 11/30/2019 6.4  1.7 - 7.7 K/uL Final  . Lymphocytes Relative 11/30/2019 14  % Final  .  Lymphs Abs 11/30/2019 1.2  0.7 - 4.0 K/uL Final  . Monocytes Relative 11/30/2019 8  % Final  . Monocytes Absolute 11/30/2019 0.7  0.1 - 1.0 K/uL Final  . Eosinophils Relative 11/30/2019 2  % Final  . Eosinophils Absolute 11/30/2019 0.2  0.0 - 0.5 K/uL Final  . Basophils Relative 11/30/2019 1  % Final  . Basophils  Absolute 11/30/2019 0.1  0.0 - 0.1 K/uL Final  . Immature Granulocytes 11/30/2019 1  % Final  . Abs Immature Granulocytes 11/30/2019 0.06  0.00 - 0.07 K/uL Final   Performed at [facility], [address], [place], [place] [address]    Assessment:  Madison Franklin is a 74 y.o. female  with anemia of chronic kidney disease. She has had a gradual decline in renal function of the course of the last 8 months  Work-up on 08/07/2019revealed a hematocrit of 33.5, hemoglobin 10.3, and MCV 78.4. Ferritinwas 20. Iron saturation was 8% with a TIBC of 395. B12was 259 (low). Folate was 10.3. Creatinine was 1.51.  She hasiron deficiency anemiaandB12 deficiency. She is on oral B12. B12 was 259 on 05/26/2018 and 1080 on 08/03/2018. Anti-parietal antibody and intrinsic factor antibodies were negative on 06/30/2018. B12 was discontinued on 12/15/2018.  Oral ironbegan 08/04/2018 and discontinued on 04/27/2019.  Ferritinhas been followed: 31 on 11/24/2017, 20 on 05/26/2018, 17 on 06/30/2018, 27 on 08/03/2018, 34 on 08/31/2018, 33 on 10/11/2018, 46 on 11/23/2018, 129 on 03/23/2019, 86 on 05/25/2019, 76 on 08/25/2019, and 104 on 11/30/2019.  She has a history of colonic polyps. Colonoscopyon 11/14/2016 revealed a sub-optimal prep. Findings included a 10 mm polyp in the cecum, seven 4-10 mm sessile polyps in the transverse colon, a 6 mm polyp in the descending colon, three 5-6 mm sessile polyps in the sigmoid colon,. Pathology revealed 7 tubular adenomas, 2 hyperplastic polyps, and 2 inflammatory polyps. Colonoscopyon 11/10/2018 revealed three 5 to 6 mm polyps in the  ascending colon(2 tubular adenomas; sessile serrated adenoma),one 8 mm polyp in the transverse colon(tubular adenoma),one 5 mm polyp in the transverse colon(tubular adenoma),one 5 mm polyp in the sigmoid colon(tubular adenoma),one 7 mm polyp at the recto-sigmoid colon(hyperplastic polyp),and non-bleeding external and internal hemorrhoids.She will have a follow-up colonoscopy in 3 years.  EGDon 11/10/2018 revealed non-bleeding erosive gastropathy. Pathology revealed chronic inactive atrophic gastritis with intestinal metaplasia. There was a single non-bleeding erosion in the upper third of the esophagus.  EGD on 05/11/2019 revealed normal duodenal bulb and second portion of the duodenum. There was non-bleeding erosive gastropathy and gastric mucosal atrophy.  There was a normal gastroesophageal junction and esophagus. Pathology revealed chronic inactive atrophic gastritis with intestinal metaplasia, negative for H pylori.   She has a 15 pack year smoking history. Chest CTon 03/13/2017 revealed a 4 mm LUL pulmonary nodule (decreased from prior). Low dose chest CTon 06/01/2018 revealed Lung-RADS 2, benign appearance or behavior. There was gallbladder wall calcifications indicative of "porcelain gallbladder".   Abdomen and pelvic CTon 07/25/2018 revealed a chronically abnormal gallbladder with circumferential partially calcified wall thickening and heterogeneity. This was partially visible in 2016 and 2015. There appears to be an exophytic 3 cm component of calcified soft tissue extending inferiorly from the main body of the gallbladder. Obliterated lumen perhaps that is gallbladder fundus is uncertain.  She has stage III chronic renal insufficiency.  Creatinine was 1.27 on 05/25/2019.  Symptomatically, she feels "alright".  She denies any bleeding.  Exam is stable.  Plan: 1.   Labs today: CBC with diff, BMP, ferritin, iron studies. 2.   Anemia of chronic renal  disease Hematocrit 35.7.  Hemoglobin 10.8.  MCV 82.6.  No Procrit needed today. 3.   Iron deficiency Ferritin 104. Iron saturation 15% with a TIBC of 297. Patient stopped taking oral iron "a while back". Continue to monitor. 4.   B12 deficiency B12 level was 462 on 05/25/2019.  She continues oral B12.   B12 goal > 400. Folate 19.8 on 05/25/2019. Check folate annually. 5.   Porcelain gallbladder Patient has a prominent RUQ on exam c/w porcelain gallbladder. Vernard Gambles is followed by Dr. Dahlia Byes. Per patient, no plan for intervention unless symptomatic 6.   RTC in 3 months for labs (CBC with diff, ferritin).   7.   RTC in 6 months for MD assessment and labs (CBC with diff, creatinine, ferritin, iron studies).   I discussed the assessment and treatment plan with the patient.  The patient was provided an opportunity to ask questions and all were answered.  The patient agreed with the plan and demonstrated an understanding of the instructions.  The patient was advised to call back if the symptoms worsen or if the condition fails to improve as anticipated.   Lequita Asal, MD, PhD    11/30/2019, 9:05 AM

## 2019-11-29 ENCOUNTER — Other Ambulatory Visit: Payer: Self-pay

## 2019-11-29 ENCOUNTER — Encounter: Payer: Self-pay | Admitting: Hematology and Oncology

## 2019-11-29 NOTE — Progress Notes (Signed)
Confirmed patient name and DOB. Patient reports no concerns during pre appointment phone assessment.

## 2019-11-30 ENCOUNTER — Inpatient Hospital Stay (HOSPITAL_BASED_OUTPATIENT_CLINIC_OR_DEPARTMENT_OTHER): Payer: Medicare Other | Admitting: Hematology and Oncology

## 2019-11-30 ENCOUNTER — Other Ambulatory Visit: Payer: Self-pay

## 2019-11-30 ENCOUNTER — Encounter: Payer: Self-pay | Admitting: Hematology and Oncology

## 2019-11-30 ENCOUNTER — Ambulatory Visit (INDEPENDENT_AMBULATORY_CARE_PROVIDER_SITE_OTHER): Payer: Medicare Other | Admitting: General Practice

## 2019-11-30 ENCOUNTER — Telehealth: Payer: Medicare Other | Admitting: General Practice

## 2019-11-30 ENCOUNTER — Inpatient Hospital Stay: Payer: Medicare Other | Attending: Hematology and Oncology

## 2019-11-30 ENCOUNTER — Other Ambulatory Visit: Payer: Self-pay | Admitting: Hematology and Oncology

## 2019-11-30 VITALS — BP 134/53 | HR 87 | Temp 98.2°F | Resp 18 | Ht 61.0 in | Wt 150.0 lb

## 2019-11-30 DIAGNOSIS — I10 Essential (primary) hypertension: Secondary | ICD-10-CM | POA: Diagnosis not present

## 2019-11-30 DIAGNOSIS — N1831 Chronic kidney disease, stage 3a: Secondary | ICD-10-CM

## 2019-11-30 DIAGNOSIS — J449 Chronic obstructive pulmonary disease, unspecified: Secondary | ICD-10-CM

## 2019-11-30 DIAGNOSIS — E538 Deficiency of other specified B group vitamins: Secondary | ICD-10-CM | POA: Insufficient documentation

## 2019-11-30 DIAGNOSIS — D509 Iron deficiency anemia, unspecified: Secondary | ICD-10-CM | POA: Insufficient documentation

## 2019-11-30 DIAGNOSIS — N183 Chronic kidney disease, stage 3 unspecified: Secondary | ICD-10-CM

## 2019-11-30 DIAGNOSIS — D124 Benign neoplasm of descending colon: Secondary | ICD-10-CM

## 2019-11-30 DIAGNOSIS — E782 Mixed hyperlipidemia: Secondary | ICD-10-CM

## 2019-11-30 DIAGNOSIS — K828 Other specified diseases of gallbladder: Secondary | ICD-10-CM

## 2019-11-30 DIAGNOSIS — D631 Anemia in chronic kidney disease: Secondary | ICD-10-CM | POA: Insufficient documentation

## 2019-11-30 DIAGNOSIS — I129 Hypertensive chronic kidney disease with stage 1 through stage 4 chronic kidney disease, or unspecified chronic kidney disease: Secondary | ICD-10-CM | POA: Insufficient documentation

## 2019-11-30 DIAGNOSIS — R911 Solitary pulmonary nodule: Secondary | ICD-10-CM

## 2019-11-30 LAB — CBC WITH DIFFERENTIAL/PLATELET
Abs Immature Granulocytes: 0.06 10*3/uL (ref 0.00–0.07)
Basophils Absolute: 0.1 10*3/uL (ref 0.0–0.1)
Basophils Relative: 1 %
Eosinophils Absolute: 0.2 10*3/uL (ref 0.0–0.5)
Eosinophils Relative: 2 %
HCT: 35.7 % — ABNORMAL LOW (ref 36.0–46.0)
Hemoglobin: 10.8 g/dL — ABNORMAL LOW (ref 12.0–15.0)
Immature Granulocytes: 1 %
Lymphocytes Relative: 14 %
Lymphs Abs: 1.2 10*3/uL (ref 0.7–4.0)
MCH: 25 pg — ABNORMAL LOW (ref 26.0–34.0)
MCHC: 30.3 g/dL (ref 30.0–36.0)
MCV: 82.6 fL (ref 80.0–100.0)
Monocytes Absolute: 0.7 10*3/uL (ref 0.1–1.0)
Monocytes Relative: 8 %
Neutro Abs: 6.4 10*3/uL (ref 1.7–7.7)
Neutrophils Relative %: 74 %
Platelets: 276 10*3/uL (ref 150–400)
RBC: 4.32 MIL/uL (ref 3.87–5.11)
RDW: 14.3 % (ref 11.5–15.5)
WBC: 8.6 10*3/uL (ref 4.0–10.5)
nRBC: 0 % (ref 0.0–0.2)

## 2019-11-30 LAB — BASIC METABOLIC PANEL
Anion gap: 11 (ref 5–15)
BUN: 37 mg/dL — ABNORMAL HIGH (ref 8–23)
CO2: 22 mmol/L (ref 22–32)
Calcium: 9.3 mg/dL (ref 8.9–10.3)
Chloride: 103 mmol/L (ref 98–111)
Creatinine, Ser: 1.43 mg/dL — ABNORMAL HIGH (ref 0.44–1.00)
GFR calc Af Amer: 41 mL/min — ABNORMAL LOW (ref >60)
GFR calc non Af Amer: 36 mL/min — ABNORMAL LOW (ref >60)
Glucose, Bld: 144 mg/dL — ABNORMAL HIGH (ref 70–99)
Potassium: 4.1 mmol/L (ref 3.5–5.1)
Sodium: 136 mmol/L (ref 135–145)

## 2019-11-30 LAB — IRON AND TIBC
Iron: 43 ug/dL (ref 28–170)
Saturation Ratios: 15 % (ref 10.4–31.8)
TIBC: 297 ug/dL (ref 250–450)
UIBC: 254 ug/dL

## 2019-11-30 LAB — FERRITIN: Ferritin: 104 ng/mL (ref 11–307)

## 2019-11-30 NOTE — Patient Instructions (Signed)
Visit Information ° °Goals Addressed   °  °  °  °  ° This Visit's Progress  ° • RNCM: We try to do what is right and take care of each other     °  Current Barriers:  °• Chronic Disease Management support, education, and care coordination needs related to HTN, HLD, COPD, CKD Stage 3, and Nodule of Upper lobe left lung ° °Clinical Goal(s) related to HTN, HLD, COPD, CKD Stage 3, and nodule of upper lobe left lung :  °Over the next 90 days, patient will:  °• Work with the care management team to address educational, disease management, and care coordination needs  °• Begin or continue self health monitoring activities as directed today Measure and record blood pressure 5 times per week °• Call provider office for new or worsened signs and symptoms Blood pressure findings outside established parameters, Oxygen saturation lower than established parameter, Shortness of breath, and New or worsened symptom related to CKD3/HLD/Nodule of Upper lobe left lung °• Call care management team with questions or concerns °• Verbalize basic understanding of patient centered plan of care established today ° °Interventions related to HTN, HLD, COPD, CKD Stage 3, and Nodule of upper lobe left lung :  °• Evaluation of current treatment plans and patient's adherence to plan as established by provider °• Assessed patient understanding of disease states °• Assessed patient's education and care coordination needs °• Provided disease specific education to patient, patient states she does not use sodium in her diet  °• Collaborated with appropriate clinical care team members regarding patient needs °• Evaluation of current pending lab work when available ° °Patient Self Care Activities related to HTN, HLD, COPD, CKD Stage 3, and Nodule of upper lobe left lung :  °• Patient is unable to independently self-manage chronic health conditions ° °Initial goal documentation  °  °  ° ° °Ms. Klayman was given information about Chronic Care Management  services today including:  °1. CCM service includes personalized support from designated clinical staff supervised by her physician, including individualized plan of care and coordination with other care providers °2. 24/7 contact phone numbers for assistance for urgent and routine care needs. °3. Service will only be billed when office clinical staff spend 20 minutes or more in a month to coordinate care. °4. Only one practitioner may furnish and bill the service in a calendar month. °5. The patient may stop CCM services at any time (effective at the end of the month) by phone call to the office staff. °6. The patient will be responsible for cost sharing (co-pay) of up to 20% of the service fee (after annual deductible is met). ° °Patient agreed to services and verbal consent obtained.  ° °The patient verbalized understanding of instructions provided today and declined a print copy of patient instruction materials.  ° °The care management team will reach out to the patient again over the next 60 days.  ° °Pam Tate RN, MSN, CCM °Community Care Coordinator °[place]   [facility] °[facility] °Mobile: [phone] ° ° °

## 2019-11-30 NOTE — Chronic Care Management (AMB) (Signed)
Chronic Care Management   Initial Visit Note  11/30/2019 Name: Madison Franklin MRN: [id number] DOB: May 11, 1946  Referred by: Venita Lick, NP Reason for referral : Chronic Care Management (Initial Outreach: HTN/COPD/CKD3/HLD/Nodule of upper lobe left lung)   Madison Franklin is a 74 y.o. year old female who is a primary care patient of Cannady, Barbaraann Faster, NP. The CCM team was consulted for assistance with chronic disease management and care coordination needs related to HTN, HLD, COPD, CKD Stage 3 and Nodule of Upper Lobe left lung  Review of patient status, including review of consultants reports, relevant laboratory and other test results, and collaboration with appropriate care team members and the patient's provider was performed as part of comprehensive patient evaluation and provision of chronic care management services.    SDOH (Social Determinants of Health) screening performed today: Biomedical engineer  Food Insecurity  Depression   Alcohol/Substance Use Tobacco Use Stress. See Care Plan for related entries.   Medications: Outpatient Encounter Medications as of 11/30/2019  Medication Sig  . acetaminophen (TYLENOL) 325 MG tablet Take 325 mg by mouth every 6 (six) hours as needed (for pain.).  Marland Kitchen albuterol (VENTOLIN HFA) 108 (90 Base) MCG/ACT inhaler Inhale 2 puffs into the lungs every 6 (six) hours as needed for wheezing or shortness of breath.  Marland Kitchen amLODipine (NORVASC) 5 MG tablet Take 1 tablet (5 mg total) by mouth daily.  Marland Kitchen aspirin EC 81 MG tablet Take 81 mg by mouth daily.  Marland Kitchen atorvastatin (LIPITOR) 10 MG tablet Take 1 tablet (10 mg total) by mouth daily.  . hydrochlorothiazide (MICROZIDE) 12.5 MG capsule Take 1 capsule (12.5 mg total) by mouth daily.  Marland Kitchen lisinopril (ZESTRIL) 40 MG tablet Take 1 tablet (40 mg total) by mouth daily.  Marland Kitchen umeclidinium-vilanterol (ANORO ELLIPTA) 62.5-25 MCG/INH AEPB Inhale 1 puff into the lungs daily.  . vitamin B-12 (CYANOCOBALAMIN)  1000 MCG tablet Take 1,000 mcg by mouth daily.  . Vitamin D, Cholecalciferol, 1000 units TABS Take 1 tablet by mouth daily.   No facility-administered encounter medications on file as of 11/30/2019.     Objective:  BP Readings from Last 3 Encounters:  11/30/19 (!) 134/53  10/17/19 133/65  10/07/19 139/65    Goals Addressed            This Visit's Progress   . RNCM: We try to do what is right and take care of each other       Current Barriers:  . Chronic Disease Management support, education, and care coordination needs related to HTN, HLD, COPD, CKD Stage 3, and Nodule of Upper lobe left lung  Clinical Goal(s) related to HTN, HLD, COPD, CKD Stage 3, and nodule of upper lobe left lung :  Over the next 90 days, patient will:  . Work with the care management team to address educational, disease management, and care coordination needs  . Begin or continue self health monitoring activities as directed today Measure and record blood pressure 5 times per week . Call provider office for new or worsened signs and symptoms Blood pressure findings outside established parameters, Oxygen saturation lower than established parameter, Shortness of breath, and New or worsened symptom related to CKD3/HLD/Nodule of Upper lobe left lung . Call care management team with questions or concerns . Verbalize basic understanding of patient centered plan of care established today  Interventions related to HTN, HLD, COPD, CKD Stage 3, and Nodule of upper lobe left lung :  . Evaluation of  current treatment plans and patient's adherence to plan as established by provider . Assessed patient understanding of disease states . Assessed patient's education and care coordination needs . Provided disease specific education to patient, patient states she does not use sodium in her diet  . Collaborated with appropriate clinical care team members regarding patient needs . Evaluation of current pending lab work when  available  Patient Self Care Activities related to HTN, HLD, COPD, CKD Stage 3, and Nodule of upper lobe left lung :  . Patient is unable to independently self-manage chronic health conditions  Initial goal documentation         Ms. Burklow was given information about Chronic Care Management services today including:  1. CCM service includes personalized support from designated clinical staff supervised by her physician, including individualized plan of care and coordination with other care providers 2. 24/7 contact phone numbers for assistance for urgent and routine care needs. 3. Service will only be billed when office clinical staff spend 20 minutes or more in a month to coordinate care. 4. Only one practitioner may furnish and bill the service in a calendar month. 5. The patient may stop CCM services at any time (effective at the end of the month) by phone call to the office staff. 6. The patient will be responsible for cost sharing (co-pay) of up to 20% of the service fee (after annual deductible is met).  Patient agreed to services and verbal consent obtained.   Plan:   The care management team will reach out to the patient again over the next 60 days.   Noreene Larsson RN, MSN, [facility] Mobile: [phone]

## 2019-12-10 ENCOUNTER — Ambulatory Visit: Payer: Medicare Other

## 2019-12-15 ENCOUNTER — Ambulatory Visit: Payer: Medicare Other | Attending: Internal Medicine

## 2019-12-15 DIAGNOSIS — Z23 Encounter for immunization: Secondary | ICD-10-CM | POA: Insufficient documentation

## 2019-12-15 NOTE — Progress Notes (Signed)
   Covid-19 Vaccination Clinic  Name:  ROSALINA TROWBRIDGE    MRN: [id number] DOB: [birth year]  12/15/2019  Ms. Saturno was observed post Covid-19 immunization for 15 minutes without incidence. She was provided with Vaccine Information Sheet and instruction to access the V-Safe system.   Ms. Minott was instructed to call 911 with any severe reactions post vaccine: Marland Kitchen Difficulty breathing  . Swelling of your face and throat  . A fast heartbeat  . A bad rash all over your body  . Dizziness and weakness    Immunizations Administered    Name Date Dose VIS Date Route   Pfizer COVID-19 Vaccine 12/15/2019 11:06 AM 0.3 mL 09/30/2019 Intramuscular   Manufacturer: [place]   Lot: [id number]   [place]: [id number]

## 2019-12-31 ENCOUNTER — Other Ambulatory Visit: Payer: Self-pay | Admitting: Nurse Practitioner

## 2020-01-11 ENCOUNTER — Ambulatory Visit: Payer: Medicare Other | Attending: Internal Medicine

## 2020-01-11 DIAGNOSIS — Z23 Encounter for immunization: Secondary | ICD-10-CM

## 2020-01-11 NOTE — Progress Notes (Signed)
   Covid-19 Vaccination Clinic  Name:  Madison Franklin    MRN: [id number] DOB: November 11, 1945  01/11/2020  [name] was observed post Covid-19 immunization for 15 minutes without incident. She was provided with Vaccine Information Sheet and instruction to access the V-Safe system.   [name] was instructed to call 911 with any severe reactions post vaccine: Marland Kitchen Difficulty breathing  . Swelling of face and throat  . A fast heartbeat  . A bad rash all over body  . Dizziness and weakness   Immunizations Administered    Name Date Dose VIS Date Route   Pfizer COVID-19 Vaccine 01/11/2020 10:32 AM 0.3 mL 09/30/2019 Intramuscular   Manufacturer: [employer], [employer]   Lot: [id number]   [place]: [id number]

## 2020-01-25 ENCOUNTER — Telehealth: Payer: Self-pay | Admitting: General Practice

## 2020-01-25 ENCOUNTER — Ambulatory Visit (INDEPENDENT_AMBULATORY_CARE_PROVIDER_SITE_OTHER): Payer: Medicare Other | Admitting: General Practice

## 2020-01-25 DIAGNOSIS — N1831 Chronic kidney disease, stage 3a: Secondary | ICD-10-CM

## 2020-01-25 DIAGNOSIS — R911 Solitary pulmonary nodule: Secondary | ICD-10-CM

## 2020-01-25 DIAGNOSIS — J449 Chronic obstructive pulmonary disease, unspecified: Secondary | ICD-10-CM | POA: Diagnosis not present

## 2020-01-25 DIAGNOSIS — I10 Essential (primary) hypertension: Secondary | ICD-10-CM

## 2020-01-25 DIAGNOSIS — E782 Mixed hyperlipidemia: Secondary | ICD-10-CM | POA: Diagnosis not present

## 2020-01-25 NOTE — Patient Instructions (Signed)
Visit Information  Goals Addressed            This Visit's Progress   . RNCM: We try to do what is right and take care of each other       Current Barriers:  . Chronic Disease Management support, education, and care coordination needs related to HTN, HLD, COPD, CKD Stage 3, and Nodule of Upper lobe left lung  Clinical Goal(s) related to HTN, HLD, COPD, CKD Stage 3, and nodule of upper lobe left lung :  Over the next 120 days, patient will:  . Work with the care management team to address educational, disease management, and care coordination needs  . Begin or continue self health monitoring activities as directed today Measure and record blood pressure 5 times per week . Call provider office for new or worsened signs and symptoms Blood pressure findings outside established parameters, Oxygen saturation lower than established parameter, Shortness of breath, and New or worsened symptom related to CKD3/HLD/Nodule of Upper lobe left lung . Call care management team with questions or concerns . Verbalize basic understanding of patient centered plan of care established today  Interventions related to HTN, HLD, COPD, CKD Stage 3, and Nodule of upper lobe left lung :  . Evaluation of current treatment plans and patient's adherence to plan as established by provider. Per the patients husband the patient has completed her treatments for lung nodule.  Denies any new concerns with her health at this time  . Assessed patient understanding of disease states . Assessed patient's education and care coordination needs.  Denies any new concerns at this time.  . Provided disease specific education to patient, patient states she does not use sodium in her diet  . Collaborated with appropriate clinical care team members regarding patient needs . Evaluation of blood pressure readings. The patient is taking almost daily and recording. Had not taken blood pressure today and could not readily provide readings to  the [facility]. Marland Kitchen Evaluated activity level. The patient is not doing a lot of activity per her husband. Some but not as much as she should.  The patients husband states she does what she needs to do and rest when she needs to rest.  . Evaluated upcoming appointments:  The patient does not have any upcoming appointments, Review of CCM team availability to assist as needed with questions about health and well being.   Patient Self Care Activities related to HTN, HLD, COPD, CKD Stage 3, and Nodule of upper lobe left lung :  . Patient is unable to independently self-manage chronic health conditions  Please see past updates related to this goal by clicking on the "Past Updates" button in the selected goal         Patient verbalizes understanding of instructions provided today.   The care management team will reach out to the patient again over the next 60 to 90 days.   Noreene Larsson RN, MSN, [facility] Mobile: [phone]

## 2020-01-25 NOTE — Chronic Care Management (AMB) (Signed)
Chronic Care Management   Follow Up Note   01/25/2020 Name: Madison Franklin MRN: [id number] DOB: 07-10-1946  Referred by: Venita Lick, NP Reason for referral : Chronic Care Management (RNCM Chronic Disease Management and Care Coordination Needs)   Madison Franklin is a 74 y.o. year old female who is a primary care patient of Cannady, Barbaraann Faster, NP. The CCM team was consulted for assistance with chronic disease management and care coordination needs.    Review of patient status, including review of consultants reports, relevant laboratory and other test results, and collaboration with appropriate care team members and the patient's provider was performed as part of comprehensive patient evaluation and provision of chronic care management services.    SDOH (Social Determinants of Health) assessments performed: Yes See Care Plan activities for detailed interventions related to SDOH)  SDOH Interventions     Most Recent Value  SDOH Interventions  SDOH Interventions for the Following Domains  Physical Activity  Physical Activity Interventions  Other (Comments) [the patients husband says she does some activity but not alot]       Outpatient Encounter Medications as of 01/25/2020  Medication Sig  . acetaminophen (TYLENOL) 325 MG tablet Take 325 mg by mouth every 6 (six) hours as needed (for pain.).  Marland Kitchen albuterol (VENTOLIN HFA) 108 (90 Base) MCG/ACT inhaler Inhale 2 puffs into the lungs every 6 (six) hours as needed for wheezing or shortness of breath.  Marland Kitchen amLODipine (NORVASC) 5 MG tablet Take 1 tablet (5 mg total) by mouth daily.  Marland Kitchen aspirin EC 81 MG tablet Take 81 mg by mouth daily.  Marland Kitchen atorvastatin (LIPITOR) 10 MG tablet Take 1 tablet (10 mg total) by mouth daily.  . hydrochlorothiazide (MICROZIDE) 12.5 MG capsule Take 1 capsule (12.5 mg total) by mouth daily.  Marland Kitchen lisinopril (ZESTRIL) 40 MG tablet Take 1 tablet (40 mg total) by mouth daily.  Marland Kitchen umeclidinium-vilanterol (ANORO ELLIPTA) 62.5-25 MCG/INH  AEPB Inhale 1 puff into the lungs daily.  . vitamin B-12 (CYANOCOBALAMIN) 1000 MCG tablet Take 1,000 mcg by mouth daily.  . Vitamin D, Cholecalciferol, 1000 units TABS Take 1 tablet by mouth daily.   No facility-administered encounter medications on file as of 01/25/2020.     Objective:  BP Readings from Last 3 Encounters:  11/30/19 (!) 134/53  10/17/19 133/65  10/07/19 139/65    Goals Addressed            This Visit's Progress   . RNCM: We try to do what is right and take care of each other       Current Barriers:  . Chronic Disease Management support, education, and care coordination needs related to HTN, HLD, COPD, CKD Stage 3, and Nodule of Upper lobe left lung  Clinical Goal(s) related to HTN, HLD, COPD, CKD Stage 3, and nodule of upper lobe left lung :  Over the next 120 days, patient will:  . Work with the care management team to address educational, disease management, and care coordination needs  . Begin or continue self health monitoring activities as directed today Measure and record blood pressure 5 times per week . Call provider office for new or worsened signs and symptoms Blood pressure findings outside established parameters, Oxygen saturation lower than established parameter, Shortness of breath, and New or worsened symptom related to CKD3/HLD/Nodule of Upper lobe left lung . Call care management team with questions or concerns . Verbalize basic understanding of patient centered plan of care established today  Interventions related  to HTN, HLD, COPD, CKD Stage 3, and Nodule of upper lobe left lung :  . Evaluation of current treatment plans and patient's adherence to plan as established by provider. Per the patients husband the patient has completed her treatments for lung nodule.  Denies any new concerns with her health at this time  . Assessed patient understanding of disease states . Assessed patient's education and care coordination needs.  Denies any new concerns  at this time.  . Provided disease specific education to patient, patient states she does not use sodium in her diet  . Collaborated with appropriate clinical care team members regarding patient needs . Evaluation of blood pressure readings. The patient is taking almost daily and recording. Had not taken blood pressure today and could not readily provide readings to the [facility]. Marland Kitchen Evaluated activity level. The patient is not doing a lot of activity per her husband. Some but not as much as she should.  The patients husband states she does what she needs to do and rest when she needs to rest.  . Evaluated upcoming appointments:  The patient does not have any upcoming appointments, Review of CCM team availability to assist as needed with questions about health and well being.   Patient Self Care Activities related to HTN, HLD, COPD, CKD Stage 3, and Nodule of upper lobe left lung :  . Patient is unable to independently self-manage chronic health conditions  Please see past updates related to this goal by clicking on the "Past Updates" button in the selected goal          Plan:   The care management team will reach out to the patient again over the next 60 to 90 days.    Noreene Larsson RN, MSN, [facility] Mobile: [phone]

## 2020-02-15 DIAGNOSIS — H9120 Sudden idiopathic hearing loss, unspecified ear: Secondary | ICD-10-CM | POA: Diagnosis not present

## 2020-02-15 DIAGNOSIS — H903 Sensorineural hearing loss, bilateral: Secondary | ICD-10-CM | POA: Diagnosis not present

## 2020-02-15 DIAGNOSIS — H6123 Impacted cerumen, bilateral: Secondary | ICD-10-CM | POA: Diagnosis not present

## 2020-02-16 ENCOUNTER — Other Ambulatory Visit: Payer: Self-pay | Admitting: Nurse Practitioner

## 2020-02-16 DIAGNOSIS — F17200 Nicotine dependence, unspecified, uncomplicated: Secondary | ICD-10-CM

## 2020-02-27 ENCOUNTER — Inpatient Hospital Stay: Payer: Medicare Other | Attending: Hematology and Oncology

## 2020-02-27 ENCOUNTER — Other Ambulatory Visit: Payer: Self-pay

## 2020-02-27 DIAGNOSIS — N183 Chronic kidney disease, stage 3 unspecified: Secondary | ICD-10-CM

## 2020-02-27 DIAGNOSIS — E538 Deficiency of other specified B group vitamins: Secondary | ICD-10-CM | POA: Diagnosis not present

## 2020-02-27 DIAGNOSIS — D509 Iron deficiency anemia, unspecified: Secondary | ICD-10-CM | POA: Insufficient documentation

## 2020-02-27 DIAGNOSIS — D631 Anemia in chronic kidney disease: Secondary | ICD-10-CM | POA: Insufficient documentation

## 2020-02-27 DIAGNOSIS — K828 Other specified diseases of gallbladder: Secondary | ICD-10-CM

## 2020-02-27 LAB — CBC WITH DIFFERENTIAL/PLATELET
Abs Immature Granulocytes: 0.41 10*3/uL — ABNORMAL HIGH (ref 0.00–0.07)
Basophils Absolute: 0 10*3/uL (ref 0.0–0.1)
Basophils Relative: 0 %
Eosinophils Absolute: 0.2 10*3/uL (ref 0.0–0.5)
Eosinophils Relative: 2 %
HCT: 39.5 % (ref 36.0–46.0)
Hemoglobin: 11.8 g/dL — ABNORMAL LOW (ref 12.0–15.0)
Immature Granulocytes: 3 %
Lymphocytes Relative: 13 %
Lymphs Abs: 2 10*3/uL (ref 0.7–4.0)
MCH: 24.8 pg — ABNORMAL LOW (ref 26.0–34.0)
MCHC: 29.9 g/dL — ABNORMAL LOW (ref 30.0–36.0)
MCV: 83.2 fL (ref 80.0–100.0)
Monocytes Absolute: 1.4 10*3/uL — ABNORMAL HIGH (ref 0.1–1.0)
Monocytes Relative: 9 %
Neutro Abs: 10.9 10*3/uL — ABNORMAL HIGH (ref 1.7–7.7)
Neutrophils Relative %: 73 %
Platelets: 181 10*3/uL (ref 150–400)
RBC: 4.75 MIL/uL (ref 3.87–5.11)
RDW: 15.8 % — ABNORMAL HIGH (ref 11.5–15.5)
WBC: 14.9 10*3/uL — ABNORMAL HIGH (ref 4.0–10.5)
nRBC: 0 % (ref 0.0–0.2)

## 2020-02-27 LAB — FERRITIN: Ferritin: 45 ng/mL (ref 11–307)

## 2020-02-29 DIAGNOSIS — H9120 Sudden idiopathic hearing loss, unspecified ear: Secondary | ICD-10-CM | POA: Diagnosis not present

## 2020-02-29 DIAGNOSIS — H903 Sensorineural hearing loss, bilateral: Secondary | ICD-10-CM | POA: Diagnosis not present

## 2020-04-18 ENCOUNTER — Telehealth: Payer: Self-pay | Admitting: General Practice

## 2020-04-18 ENCOUNTER — Ambulatory Visit (INDEPENDENT_AMBULATORY_CARE_PROVIDER_SITE_OTHER): Payer: Medicare Other | Admitting: General Practice

## 2020-04-18 DIAGNOSIS — E782 Mixed hyperlipidemia: Secondary | ICD-10-CM

## 2020-04-18 DIAGNOSIS — J449 Chronic obstructive pulmonary disease, unspecified: Secondary | ICD-10-CM

## 2020-04-18 DIAGNOSIS — N1831 Chronic kidney disease, stage 3a: Secondary | ICD-10-CM

## 2020-04-18 DIAGNOSIS — I10 Essential (primary) hypertension: Secondary | ICD-10-CM | POA: Diagnosis not present

## 2020-04-18 NOTE — Patient Instructions (Signed)
Visit Information  Goals Addressed            This Visit's Progress   . RNCM: We try to do what is right and take care of each other       Current Barriers:  . Chronic Disease Management support, education, and care coordination needs related to HTN, HLD, COPD, CKD Stage 3, and Nodule of Upper lobe left lung  Clinical Goal(s) related to HTN, HLD, COPD, CKD Stage 3, and nodule of upper lobe left lung :  Over the next 120 days, patient will:  . Work with the care management team to address educational, disease management, and care coordination needs  . Begin or continue self health monitoring activities as directed today Measure and record blood pressure 5 times per week . Call provider office for new or worsened signs and symptoms Blood pressure findings outside established parameters, Oxygen saturation lower than established parameter, Shortness of breath, and New or worsened symptom related to CKD3/HLD/Nodule of Upper lobe left lung . Call care management team with questions or concerns . Verbalize basic understanding of patient centered plan of care established today  Interventions related to HTN, HLD, COPD, CKD Stage 3, and Nodule of upper lobe left lung :  . Evaluation of current treatment plans and patient's adherence to plan as established by provider. Per the patients husband the patient has completed her treatments for lung nodule.  Denies any new concerns with her health at this time  . Assessed patient understanding of disease states.  04-18-2020: The patients husband states he thinks the patient may need to come in for evaluation. He has noticed that the patient easily gets short of breath when doing any type of activity. Sometimes he lets her use his oxygen. He feels the patient needs to be evaluated for oxygen. He does have a pulse ox device and does check her oxygen levels at times. The patients husband says depending on what she is doing and how she feels her levels are 87 to 91.   Education on worsening s/s of condition and when to seek emergent care. The patients husband verbalized understanding. Will send an in basket message to front staff asking for staff to schedule an appointment to see the pcp. Will also let pcp know of new concern with shortness of breath.  . Assessed patient's education and care coordination needs.  04-18-2020: Education and support given to the patient and patients husband on changes in condition and calling pcp for needs. . Provided disease specific education to patient, patient states she does not use sodium in her diet  . Collaborated with appropriate clinical care team members regarding patient needs . Evaluation of blood pressure readings. The patient is taking almost daily and recording. Had not taken blood pressure today and could not readily provide readings to the [facility]. 04-18-2020: Per the husband they take her blood pressure daily and record. At the time of the call the patient had not taken her blood pressure today and could not provide readings. The husband states it has been "good". . Evaluated activity level. The patient is not doing a lot of activity per her husband. Some but not as much as she should.  The patients husband states she does what she needs to do and rest when she needs to rest.  . Evaluated upcoming appointments:  The patient does not have any upcoming appointments, Review of CCM team availability to assist as needed with questions about health and well being. Will send  an in basket message to get an appointment for evaluation with pcp.   Patient Self Care Activities related to HTN, HLD, COPD, CKD Stage 3, and Nodule of upper lobe left lung :  . Patient is unable to independently self-manage chronic health conditions  Please see past updates related to this goal by clicking on the "Past Updates" button in the selected goal         Patient verbalizes understanding of instructions provided today.   The care management team  will reach out to the patient again over the next 30 to 60 days.   Noreene Larsson RN, MSN, [facility] Mobile: [phone]

## 2020-04-18 NOTE — Progress Notes (Signed)
Lvm with husband (DPR) stated pt was sleeping and did not want to wake her up. Husband stated she would call us back to make this apt.

## 2020-04-18 NOTE — Chronic Care Management (AMB) (Signed)
Chronic Care Management   Follow Up Note   04/18/2020 Name: Madison Franklin MRN: [id number] DOB: March 24, 1946  Referred by: Venita Lick, NP Reason for referral : Chronic Care Management (Follow up: RNCM Chronic Disease Managment and Care Coordination Needs)   Madison Franklin is a 74 y.o. year old female who is a primary care patient of Cannady, Barbaraann Faster, NP. The CCM team was consulted for assistance with chronic disease management and care coordination needs.    Review of patient status, including review of consultants reports, relevant laboratory and other test results, and collaboration with appropriate care team members and the patient's provider was performed as part of comprehensive patient evaluation and provision of chronic care management services.    SDOH (Social Determinants of Health) assessments performed: Yes See Care Plan activities for detailed interventions related to [facility])     Outpatient Encounter Medications as of 04/18/2020  Medication Sig  . acetaminophen (TYLENOL) 325 MG tablet Take 325 mg by mouth every 6 (six) hours as needed (for pain.).  Marland Kitchen albuterol (VENTOLIN HFA) 108 (90 Base) MCG/ACT inhaler Inhale 2 puffs into the lungs every 6 (six) hours as needed for wheezing or shortness of breath.  Marland Kitchen amLODipine (NORVASC) 5 MG tablet Take 1 tablet (5 mg total) by mouth daily.  Marland Kitchen aspirin EC 81 MG tablet Take 81 mg by mouth daily.  Marland Kitchen atorvastatin (LIPITOR) 10 MG tablet Take 1 tablet (10 mg total) by mouth daily.  . hydrochlorothiazide (MICROZIDE) 12.5 MG capsule Take 1 capsule (12.5 mg total) by mouth daily.  Marland Kitchen lisinopril (ZESTRIL) 40 MG tablet Take 1 tablet (40 mg total) by mouth daily.  Marland Kitchen umeclidinium-vilanterol (ANORO ELLIPTA) 62.5-25 MCG/INH AEPB Inhale 1 puff into the lungs daily.  . vitamin B-12 (CYANOCOBALAMIN) 1000 MCG tablet Take 1,000 mcg by mouth daily.  . Vitamin D, Cholecalciferol, 1000 units TABS Take 1 tablet by mouth daily.   No facility-administered encounter  medications on file as of 04/18/2020.     Objective:   Goals Addressed            This Visit's Progress   . RNCM: We try to do what is right and take care of each other       Current Barriers:  . Chronic Disease Management support, education, and care coordination needs related to HTN, HLD, COPD, CKD Stage 3, and Nodule of Upper lobe left lung  Clinical Goal(s) related to HTN, HLD, COPD, CKD Stage 3, and nodule of upper lobe left lung :  Over the next 120 days, patient will:  . Work with the care management team to address educational, disease management, and care coordination needs  . Begin or continue self health monitoring activities as directed today Measure and record blood pressure 5 times per week . Call provider office for new or worsened signs and symptoms Blood pressure findings outside established parameters, Oxygen saturation lower than established parameter, Shortness of breath, and New or worsened symptom related to CKD3/HLD/Nodule of Upper lobe left lung . Call care management team with questions or concerns . Verbalize basic understanding of patient centered plan of care established today  Interventions related to HTN, HLD, COPD, CKD Stage 3, and Nodule of upper lobe left lung :  . Evaluation of current treatment plans and patient's adherence to plan as established by provider. Per the patients husband the patient has completed her treatments for lung nodule.  Denies any new concerns with her health at this time  . Assessed patient  understanding of disease states.  04-18-2020: The patients husband states he thinks the patient may need to come in for evaluation. He has noticed that the patient easily gets short of breath when doing any type of activity. Sometimes he lets her use his oxygen. He feels the patient needs to be evaluated for oxygen. He does have a pulse ox device and does check her oxygen levels at times. The patients husband says depending on what she is doing and  how she feels her levels are 87 to 91.  Education on worsening s/s of condition and when to seek emergent care. The patients husband verbalized understanding. Will send an in basket message to front staff asking for staff to schedule an appointment to see the pcp. Will also let pcp know of new concern with shortness of breath.  . Assessed patient's education and care coordination needs.  04-18-2020: Education and support given to the patient and patients husband on changes in condition and calling pcp for needs. . Provided disease specific education to patient, patient states she does not use sodium in her diet  . Collaborated with appropriate clinical care team members regarding patient needs . Evaluation of blood pressure readings. The patient is taking almost daily and recording. Had not taken blood pressure today and could not readily provide readings to the [facility]. 04-18-2020: Per the husband they take her blood pressure daily and record. At the time of the call the patient had not taken her blood pressure today and could not provide readings. The husband states it has been "good". . Evaluated activity level. The patient is not doing a lot of activity per her husband. Some but not as much as she should.  The patients husband states she does what she needs to do and rest when she needs to rest.  . Evaluated upcoming appointments:  The patient does not have any upcoming appointments, Review of CCM team availability to assist as needed with questions about health and well being. Will send an in basket message to get an appointment for evaluation with pcp.   Patient Self Care Activities related to HTN, HLD, COPD, CKD Stage 3, and Nodule of upper lobe left lung :  . Patient is unable to independently self-manage chronic health conditions  Please see past updates related to this goal by clicking on the "Past Updates" button in the selected goal          Plan:   The care management team will reach out to  the patient again over the next 30 to 60 days.    Noreene Larsson RN, MSN, [facility] Mobile: [phone]

## 2020-04-20 ENCOUNTER — Other Ambulatory Visit: Payer: Self-pay

## 2020-04-20 ENCOUNTER — Ambulatory Visit (INDEPENDENT_AMBULATORY_CARE_PROVIDER_SITE_OTHER): Payer: Medicare Other | Admitting: Nurse Practitioner

## 2020-04-20 ENCOUNTER — Encounter: Payer: Self-pay | Admitting: Nurse Practitioner

## 2020-04-20 VITALS — BP 118/64 | HR 89 | Temp 98.4°F | Wt 153.0 lb

## 2020-04-20 DIAGNOSIS — E782 Mixed hyperlipidemia: Secondary | ICD-10-CM

## 2020-04-20 DIAGNOSIS — N1831 Chronic kidney disease, stage 3a: Secondary | ICD-10-CM

## 2020-04-20 DIAGNOSIS — D631 Anemia in chronic kidney disease: Secondary | ICD-10-CM

## 2020-04-20 DIAGNOSIS — J449 Chronic obstructive pulmonary disease, unspecified: Secondary | ICD-10-CM | POA: Diagnosis not present

## 2020-04-20 DIAGNOSIS — I10 Essential (primary) hypertension: Secondary | ICD-10-CM

## 2020-04-20 DIAGNOSIS — I2721 Secondary pulmonary arterial hypertension: Secondary | ICD-10-CM

## 2020-04-20 DIAGNOSIS — N183 Chronic kidney disease, stage 3 unspecified: Secondary | ICD-10-CM

## 2020-04-20 MED ORDER — ALBUTEROL SULFATE HFA 108 (90 BASE) MCG/ACT IN AERS: 2.0000 | INHALATION_SPRAY | Freq: Four times a day (QID) | RESPIRATORY_TRACT | 3 refills | Status: DC | PRN

## 2020-04-20 NOTE — Progress Notes (Signed)
BP 118/64   Pulse 89   Temp 98.4 F (36.9 C) (Oral)   Wt 153 lb (69.4 kg)   LMP  (LMP Unknown)   SpO2 91%   BMI 28.91 kg/m    Subjective:    Patient ID: Madison Franklin, female    DOB: 07-06-1946, 74 y.o.   MRN: [id number]  HPI: RIHANA KIDDY is a 74 y.o. female  Chief Complaint  Patient presents with  . COPD  . Hypertension  . Hyperlipidemia   Husband present at bedside to assist with HPI.  HYPERTENSION / HYPERLIPIDEMIA Continues on Lisinopril, Amlodipine, and HCTZ + Atorvastatin. Satisfied with current treatment? yes Duration of hypertension: chronic BP monitoring frequency: daily BP range: 130/60-70 range BP medication side effects: no Duration of hyperlipidemia: chronic Cholesterol medication side effects: no Cholesterol supplements: none Medication compliance: good compliance Aspirin: yes Recent stressors: no Recurrent headaches: no Visual changes: no Palpitations: no Dyspnea: yes, feels this has increased over past 2-3 months Chest pain: no Lower extremity edema: no Dizzy/lightheaded: no   COPD Quit smoking over 4 years ago.  Was followed by Dr. Alva Garnet, last saw 09/21/2018.  Is using Anoro, reports using this every day sometimes -- "not all the time".  Has not gone for yearly lung CT and they have been trying to contact her to follow-up, has lung nodules to monitor.  She reports noticing increased SOB with walking over past 2-3 months, but not with sitting down resting.  Her husband reports sometimes he lets her use his oxygen.  She sleeps on two pillows at night at baseline, this has not increased.  Denies orthopnea.  Does endorse SOB with walking into office today.  Her husband is checking her O2 sats at home and they run 91-98% at home.  Her husband does endorse inhalers are costly at times -- $200 for Anoro.  Reports for one year they did not get Anoro because it was too costly, last year.   COPD status: stable Satisfied with current treatment?:  yes Oxygen use: no Dyspnea frequency: as noted above Cough frequency: occasional Rescue inhaler frequency:  2-3 times a day Limitation of activity: at times Productive cough: none Last Spirometry: 2019 Pneumovax: Up to Date Influenza: Up to Date   CHRONIC KIDNEY DISEASE Last February labs showed GFR 41 and CRT 1.43.  Has remained in this range for over a year. CKD status: stable Medications renally dose: yes Previous renal evaluation: no Pneumovax:  Up to Date Influenza Vaccine:  Up to Date   ANEMIA Followed by Dr. Mike Gip and last seen in February 2021, with H/H 10.8/35.7, MCV 82.6, Ferritin 104 -- continues on oral B12 and .   Anemia status: stable Etiology of anemia: Duration of anemia treatment:  Compliance with treatment: good compliance Iron supplementation side effects: no Severity of anemia: mild Fatigue: no Decreased exercise tolerance: at times  Dyspnea on exertion: no Palpitations: no Bleeding: no Pica: no  Relevant past medical, surgical, family and social history reviewed and updated as indicated. Interim medical history since our last visit reviewed. Allergies and medications reviewed and updated.  Review of Systems  Constitutional: Negative for activity change, appetite change, diaphoresis, fatigue and fever.  Respiratory: Positive for shortness of breath (occasional, at baseline) and wheezing (occasional). Negative for cough and chest tightness.   Cardiovascular: Negative for chest pain, palpitations and leg swelling.  Gastrointestinal: Negative.   Neurological: Negative.   Psychiatric/Behavioral: Negative.     Per HPI unless specifically indicated above  Objective:    BP 118/64   Pulse 89   Temp 98.4 F (36.9 C) (Oral)   Wt 153 lb (69.4 kg)   LMP  (LMP Unknown)   SpO2 91%   BMI 28.91 kg/m   Wt Readings from Last 3 Encounters:  04/20/20 153 lb (69.4 kg)  11/30/19 150 lb 0.4 oz (68.1 kg)  10/17/19 151 lb 12.8 oz (68.9 kg)     Physical Exam Vitals and nursing note reviewed.  Constitutional:      General: She is awake. She is not in acute distress.    Appearance: She is well-developed, well-groomed and overweight. She is not ill-appearing.  HENT:     Head: Normocephalic.     Right Ear: Hearing normal.     Left Ear: Hearing normal.  Eyes:     General: Lids are normal.        Right eye: No discharge.        Left eye: No discharge.     Conjunctiva/sclera: Conjunctivae normal.     Pupils: Pupils are equal, round, and reactive to light.  Neck:     Thyroid: No thyromegaly.     Vascular: No carotid bruit.  Cardiovascular:     Rate and Rhythm: Normal rate and regular rhythm.     Heart sounds: Murmur heard.  Systolic murmur is present with a grade of 3/6.  No gallop.   Pulmonary:     Effort: Pulmonary effort is normal.     Breath sounds: Decreased breath sounds present.     Comments: Clear throughout, but diminished breath sounds throughout.  No wheezing noted on exam today, no rhonchi and no cough present.  No SOB with talking noted. Abdominal:     General: Bowel sounds are normal.     Palpations: Abdomen is soft.  Musculoskeletal:     Cervical back: Normal range of motion and neck supple.     Right lower leg: No edema.     Left lower leg: No edema.  Lymphadenopathy:     Cervical: No cervical adenopathy.  Skin:    General: Skin is warm and dry.  Neurological:     Mental Status: She is alert and oriented to person, place, and time.  Psychiatric:        Attention and Perception: Attention normal.        Mood and Affect: Mood normal.        Speech: Speech normal.        Behavior: Behavior normal. Behavior is cooperative.        Thought Content: Thought content normal.     Results for orders placed or performed in visit on 02/27/20  Ferritin  Result Value Ref Range   Ferritin 45 11 - 307 ng/mL  CBC with Differential/Platelet  Result Value Ref Range   WBC 14.9 (H) 4.0 - 10.5 K/uL   RBC 4.75 3.87  - 5.11 MIL/uL   Hemoglobin 11.8 (L) 12.0 - 15.0 g/dL   HCT 39.5 36 - 46 %   MCV 83.2 80.0 - 100.0 fL   MCH 24.8 (L) 26.0 - 34.0 pg   MCHC 29.9 (L) 30.0 - 36.0 g/dL   RDW 15.8 (H) 11.5 - 15.5 %   Platelets 181 150 - 400 K/uL   nRBC 0.0 0.0 - 0.2 %   Neutrophils Relative % 73 %   Neutro Abs 10.9 (H) 1.7 - 7.7 K/uL   Lymphocytes Relative 13 %   Lymphs Abs 2.0 0.7 - 4.0 K/uL  Monocytes Relative 9 %   Monocytes Absolute 1.4 (H) 0 - 1 K/uL   Eosinophils Relative 2 %   Eosinophils Absolute 0.2 0 - 0 K/uL   Basophils Relative 0 %   Basophils Absolute 0.0 0 - 0 K/uL   Immature Granulocytes 3 %   Abs Immature Granulocytes 0.41 (H) 0.00 - 0.07 K/uL      Assessment & Plan:   Problem List Items Addressed This Visit      Cardiovascular and Mediastinum   Hypertension    Chronic, ongoing with BP at goal on home readings and in office on recheck today.  Continue current medication regimen and adjust as needed.  Recommend continue to monitor BP at home daily and focus on DASH diet.  Will check BMP today and TSH.  Return in 3 months.      Relevant Orders   Basic metabolic panel   TSH   Pulmonary arterial hypertension ([place])    Able to get appointment back to pulmonary in August, has missed follow-up visits.  Continue current inhaler regimen and adjust as needed.        Respiratory   COPD, severe ([place]) - Primary    Chronic, ongoing.  Uses Albuterol frequently and not consistently using Anoro due to cost -- most likely reason for SOB increase as poor inhaler use.  Has not seen pulmonary in over a year, able to get her scheduled for August today.  Recommend she scheduled annual lung CT scan for follow-up and provided husband number to call.  Return in 3 months for follow-up, sooner if worsening symptoms.      Relevant Medications   albuterol (VENTOLIN HFA) 108 (90 Base) MCG/ACT inhaler     Genitourinary   Chronic kidney disease, stage 3    Chronic, ongoing.  Stable at this time.  Will  continue Lisinopril for kidney protection, consider change to Losartan next visit due to underlying COPD.  BMP today.  Refer to nephrology if any decline.        Other   Hyperlipidemia    Chronic, ongoing.  Continue current medication regimen and adjust as needed.  Lipid panel today.      Relevant Orders   Lipid Panel w/o Chol/HDL Ratio   Anemia in chronic kidney disease    Continue collaboration with hematology, recent note reviewed.          Follow up plan: Return in about 3 months (around 07/21/2020) for COPD, HTN/HLD.

## 2020-04-20 NOTE — Assessment & Plan Note (Signed)
Chronic, ongoing.  Uses Albuterol frequently and not consistently using Anoro due to cost -- most likely reason for SOB increase as poor inhaler use.  Has not seen pulmonary in over a year, able to get her scheduled for August today.  Recommend she scheduled annual lung CT scan for follow-up and provided husband number to call.  Return in 3 months for follow-up, sooner if worsening symptoms.

## 2020-04-20 NOTE — Assessment & Plan Note (Signed)
Chronic, ongoing.  Stable at this time.  Will continue Lisinopril for kidney protection, consider change to Losartan next visit due to underlying COPD.  BMP today.  Refer to nephrology if any decline.

## 2020-04-20 NOTE — Assessment & Plan Note (Signed)
Continue collaboration with hematology, recent note reviewed.

## 2020-04-20 NOTE — Assessment & Plan Note (Signed)
Chronic, ongoing.  Continue current medication regimen and adjust as needed. Lipid panel today. 

## 2020-04-20 NOTE — Assessment & Plan Note (Signed)
Chronic, ongoing with BP at goal on home readings and in office on recheck today.  Continue current medication regimen and adjust as needed.  Recommend continue to monitor BP at home daily and focus on DASH diet.  Will check BMP today and TSH.  Return in 3 months.

## 2020-04-20 NOTE — Assessment & Plan Note (Signed)
Able to get appointment back to pulmonary in August, has missed follow-up visits.  Continue current inhaler regimen and adjust as needed.

## 2020-04-20 NOTE — Patient Instructions (Signed)
Chronic Obstructive Pulmonary Disease Chronic obstructive pulmonary disease (COPD) is a long-term (chronic) lung problem. When you have COPD, it is hard for air to get in and out of your lungs. Usually the condition gets worse over time, and your lungs will never return to normal. There are things you can do to keep yourself as healthy as possible.  Your doctor may treat your condition with: ? Medicines. ? Oxygen. ? Lung surgery.  Your doctor may also recommend: ? Rehabilitation. This includes steps to make your body work better. It may involve a team of specialists. ? Quitting smoking, if you smoke. ? Exercise and changes to your diet. ? Comfort measures (palliative care). Follow these instructions at home: Medicines  Take over-the-counter and prescription medicines only as told by your doctor.  Talk to your doctor before taking any cough or allergy medicines. You may need to avoid medicines that cause your lungs to be dry. Lifestyle  If you smoke, stop. Smoking makes the problem worse. If you need help quitting, ask your doctor.  Avoid being around things that make your breathing worse. This may include smoke, chemicals, and fumes.  Stay active, but remember to rest as well.  Learn and use tips on how to relax.  Make sure you get enough sleep. Most adults need at least 7 hours of sleep every night.  Eat healthy foods. Eat smaller meals more often. Rest before meals. Controlled breathing Learn and use tips on how to control your breathing as told by your doctor. Try:  Breathing in (inhaling) through your nose for 1 second. Then, pucker your lips and breath out (exhale) through your lips for 2 seconds.  Putting one hand on your belly (abdomen). Breathe in slowly through your nose for 1 second. Your hand on your belly should move out. Pucker your lips and breathe out slowly through your lips. Your hand on your belly should move in as you breathe out.  Controlled coughing Learn  and use controlled coughing to clear mucus from your lungs. Follow these steps: 1. Lean your head a little forward. 2. Breathe in deeply. 3. Try to hold your breath for 3 seconds. 4. Keep your mouth slightly open while coughing 2 times. 5. Spit any mucus out into a tissue. 6. Rest and do the steps again 1 or 2 times as needed. General instructions  Make sure you get all the shots (vaccines) that your doctor recommends. Ask your doctor about a flu shot and a pneumonia shot.  Use oxygen therapy and pulmonary rehabilitation if told by your doctor. If you need home oxygen therapy, ask your doctor if you should buy a tool to measure your oxygen level (oximeter).  Make a COPD action plan with your doctor. This helps you to know what to do if you feel worse than usual.  Manage any other conditions you have as told by your doctor.  Avoid going outside when it is very hot, cold, or humid.  Avoid people who have a sickness you can catch (contagious).  Keep all follow-up visits as told by your doctor. This is important. Contact a doctor if:  You cough up more mucus than usual.  There is a change in the color or thickness of the mucus.  It is harder to breathe than usual.  Your breathing is faster than usual.  You have trouble sleeping.  You need to use your medicines more often than usual.  You have trouble doing your normal activities such as getting dressed   or walking around the house. Get help right away if:  You have shortness of breath while resting.  You have shortness of breath that stops you from: ? Being able to talk. ? Doing normal activities.  Your chest hurts for longer than 5 minutes.  Your skin color is more blue than usual.  Your pulse oximeter shows that you have low oxygen for longer than 5 minutes.  You have a fever.  You feel too tired to breathe normally. Summary  Chronic obstructive pulmonary disease (COPD) is a long-term lung problem.  The way your  lungs work will never return to normal. Usually the condition gets worse over time. There are things you can do to keep yourself as healthy as possible.  Take over-the-counter and prescription medicines only as told by your doctor.  If you smoke, stop. Smoking makes the problem worse. This information is not intended to replace advice given to you by your health care provider. Make sure you discuss any questions you have with your health care provider. Document Revised: 09/18/2017 Document Reviewed: 11/10/2016 Elsevier Patient Education  2020 Elsevier Inc.  

## 2020-04-21 LAB — BASIC METABOLIC PANEL
BUN/Creatinine Ratio: 25 (ref 12–28)
BUN: 37 mg/dL — ABNORMAL HIGH (ref 8–27)
CO2: 18 mmol/L — ABNORMAL LOW (ref 20–29)
Calcium: 9.7 mg/dL (ref 8.7–10.3)
Chloride: 105 mmol/L (ref 96–106)
Creatinine, Ser: 1.48 mg/dL — ABNORMAL HIGH (ref 0.57–1.00)
GFR calc Af Amer: 40 mL/min/{1.73_m2} — ABNORMAL LOW (ref >59)
GFR calc non Af Amer: 34 mL/min/{1.73_m2} — ABNORMAL LOW (ref >59)
Glucose: 80 mg/dL (ref 65–99)
Potassium: 4.9 mmol/L (ref 3.5–5.2)
Sodium: 139 mmol/L (ref 134–144)

## 2020-04-21 LAB — LIPID PANEL W/O CHOL/HDL RATIO
Cholesterol, Total: 192 mg/dL (ref 100–199)
HDL: 52 mg/dL (ref >39)
LDL Chol Calc (NIH): 105 mg/dL — ABNORMAL HIGH (ref 0–99)
Triglycerides: 205 mg/dL — ABNORMAL HIGH (ref 0–149)
VLDL Cholesterol Cal: 35 mg/dL (ref 5–40)

## 2020-04-21 LAB — TSH: TSH: 0.999 u[IU]/mL (ref 0.450–4.500)

## 2020-04-21 NOTE — Progress Notes (Signed)
Contacted via [place] afternoon [employer], your labs have returned: - Cholesterol levels are elevated -- Atorvastatin are you taking this daily?  If you are then I would like to increase dose on this to 20 MG to help gain better control. Are you okay with this? - Continue to show some kidney disease.  Do you see a kidney doctor?  This may be beneficial to refer to if not. - Thyroid testing is normal. - Remember to use your Anoro every day and Albuterol only as needed.  Any questions? Keep being awesome!! Kindest regards, Vasiliki Smaldone

## 2020-04-24 ENCOUNTER — Ambulatory Visit: Payer: Self-pay | Admitting: Pharmacist

## 2020-04-24 ENCOUNTER — Telehealth: Payer: Self-pay

## 2020-04-24 NOTE — Chronic Care Management (AMB) (Signed)
  Chronic Care Management   Note  04/24/2020 Name: Madison Franklin MRN: [id number] DOB: Jan 09, 1946  Madison Franklin is a 74 y.o. year old female who is a primary care patient of Cannady, Barbaraann Faster, NP. The CCM team was consulted for assistance with chronic disease management and care coordination needs.    Attempted to contact patient for medication management review. Left HIPAA compliant message for patient to return my call at their convenience.   Plan: - Will collaborate with Care Guide to outreach to schedule follow up with me  Catie Darnelle Maffucci, PharmD, [place] [phone]

## 2020-04-25 ENCOUNTER — Telehealth: Payer: Self-pay | Admitting: Nurse Practitioner

## 2020-04-25 NOTE — Chronic Care Management (AMB) (Signed)
  Care Management   Note  04/25/2020 Name: MIKAYA BUNNER MRN: [id number] DOB: 12-Feb-1946  KIERRAH KILBRIDE is a 74 y.o. year old female who is a primary care patient of Venita Lick, NP and is actively engaged with the care management team. I reached out to [facility] by phone today to assist with re-scheduling an initial visit with the Pharmacist  Follow up plan: Telephone appointment with care management team member scheduled for:04/27/2020  Noreene Larsson, [place], [place], [place] [address] Direct Dial: [phone] [email or web address] Website: [email or web address]

## 2020-04-27 ENCOUNTER — Other Ambulatory Visit: Payer: Self-pay | Admitting: Nurse Practitioner

## 2020-04-27 ENCOUNTER — Other Ambulatory Visit: Payer: Self-pay | Admitting: Pharmacy Technician

## 2020-04-27 ENCOUNTER — Ambulatory Visit (INDEPENDENT_AMBULATORY_CARE_PROVIDER_SITE_OTHER): Payer: Medicare Other | Admitting: Pharmacist

## 2020-04-27 DIAGNOSIS — E782 Mixed hyperlipidemia: Secondary | ICD-10-CM

## 2020-04-27 DIAGNOSIS — J449 Chronic obstructive pulmonary disease, unspecified: Secondary | ICD-10-CM

## 2020-04-27 DIAGNOSIS — N1831 Chronic kidney disease, stage 3a: Secondary | ICD-10-CM

## 2020-04-27 MED ORDER — ATORVASTATIN CALCIUM 20 MG PO TABS: 20.0000 mg | ORAL_TABLET | Freq: Every day | ORAL | 3 refills | Status: DC

## 2020-04-27 MED ORDER — TIOTROPIUM BROMIDE-OLODATEROL 2.5-2.5 MCG/ACT IN AERS: 2.0000 | INHALATION_SPRAY | Freq: Every day | RESPIRATORY_TRACT | 6 refills | Status: DC

## 2020-04-27 NOTE — Patient Instructions (Addendum)
Madison Franklin,   It was great talking to you today!  We are going to work on getting you a new inhaler, Stiolto, through patient assistance. Look for an envelope from [employer] from Medical sales representative, [place].   Increase atorvastatin to 20 mg daily. You can take 2 of the 10 mg tablets you currently have.    Call me with any questions or concerns!  Catie Darnelle Maffucci, PharmD [phone]    Visit Information  Goals Addressed              This Visit's Progress     Patient Stated   .  PharmD "I can't afford my medication" (pt-stated)        CARE PLAN ENTRY (see longitudinal plan of care for additional care plan information)  Current Barriers:  . Polypharmacy; complex patient with multiple comorbidities including COPD, HLD, HTN . Financial concerns - patient reports today that she cannot afford Anoro . Most recent eGFR: ~ 40 mL/min - last saw nephrology Dr. Holley Raring in 2019 o COPD: Anoro prescribed, but too expensive so she does not have. Albuterol HFA PRN, usually 1-2 times daily o YOF:[id number] 5 mg daily, HCTZ 12.5 mg daily, lisinopril 40 mg o ASCVD risk reduction: atorvastatin 10 mg daily; last LDL 105. Reports adherence. PCP noted she wanted to increase to 20 mg daily, but does not appear patient was informed; ASA 81 mg daily o Supplements: Vitamin B12, vitamin D  Pharmacist Clinical Goal(s):  Marland Kitchen Over the next 90 days, patient will work with PharmD and provider towards optimized medication management  Interventions: . Comprehensive medication review performed; medication list updated in electronic medical record . Inter-disciplinary care team collaboration (see longitudinal plan of care) . Discussed income. Patient meets income requirement for Anoro through [place], but has not spent the required $600 out of pocket on her prescriptions. She would meet requirements for Stiolto through [place]. Will collaborate w/ PCP to order Stiolto 2.5/2.5 mcg 2 puffs daily, No Print. Will  collaborate w/ CPhT to mail patient portion of the application. Will collaborate w/ PCP for provider portion. Once all parts received, will submit to [facility] [employer] [facility].  . Discussed lipids. Patient amenable to increasing atorvastatin. She will take 2 of the 10 mg tabs until completed, I will collaborate w/ PCP to order atorvastatin 20 mg daily . She is amenable to getting back in w/ nephrology Dr. Holley Raring. Will collaborate w/ PCP for referral  Patient Self Care Activities:  . Patient will take medications as prescribed  Initial goal documentation        The patient verbalized understanding of instructions provided today and declined a print copy of patient instruction materials.   Plan: - Scheduled f/u call in ~4 weeks  Catie Darnelle Maffucci, PharmD, [place] [phone]

## 2020-04-27 NOTE — Patient Outreach (Signed)
[facility]) Care Management  04/27/2020  Madison Franklin 1946/07/03 [id number]                                       Medication Assistance Referral  Referral From: [place] RPh Catie T.   Medication/Company: Stiolto / BI Patient application portion:  Mailed Provider application portion:  N/A Embedded RPh to have signed while in clinic to Marnee Guarneri, NP Provider address/fax verified via: Office website     Follow up:  Will follow up with patient in 5-15 business days to confirm application(s) have been received.  Irais Mottram P. Lainee Lehrman, [place]  [phone]

## 2020-04-27 NOTE — Chronic Care Management (AMB) (Signed)
Chronic Care Management   Note  04/27/2020 Name: Madison Franklin MRN: [id number] DOB: Jun 03, 1946   Subjective:  Madison Franklin is a 74 y.o. year old female who is a primary care patient of Cannady, Madison Faster, NP. The CCM team was consulted for assistance with chronic disease management and care coordination needs.     Madison Franklin was given information about Chronic Care Management services today including:  1. CCM service includes personalized support from designated clinical staff supervised by her physician, including individualized plan of care and coordination with other care providers 2. 24/7 contact phone numbers for assistance for urgent and routine care needs. 3. Service will only be billed when office clinical staff spend 20 minutes or more in a month to coordinate care. 4. Only one practitioner may furnish and bill the service in a calendar month. 5. The patient may stop CCM services at any time (effective at the end of the month) by phone call to the office staff. 6. The patient will be responsible for cost sharing (co-pay) of up to 20% of the service fee (after annual deductible is met).  Patient agreed to services and verbal consent obtained.   Review of patient status, including review of consultants reports, laboratory and other test data, was performed as part of comprehensive evaluation and provision of chronic care management services.   SDOH (Social Determinants of Health) assessments and interventions performed:  SDOH Interventions     Most Recent Value  SDOH Interventions  Financial Strain Interventions Other (Comment)  [medication assistance]       Objective:  Lab Results  Component Value Date   CREATININE 1.48 (H) 04/20/2020   CREATININE 1.43 (H) 11/30/2019   CREATININE 1.27 (H) 05/25/2019    No results found for: HGBA1C     Component Value Date/Time   CHOL 192 04/20/2020 1419   TRIG 205 (H) 04/20/2020 1419   HDL 52 04/20/2020 1419   LDLCALC 105 (H)  04/20/2020 1419    Clinical ASCVD: No  The 10-year ASCVD risk score Madison Franklin., et al., 2013) is: 12.9%   Values used to calculate the score:     Age: 29 years     Sex: Female     Is Non-Hispanic African American: Yes     Diabetic: No     Tobacco smoker: No     Systolic Blood Pressure: 833 mmHg     Is BP treated: Yes     HDL Cholesterol: 52 mg/dL     Total Cholesterol: 192 mg/dL    BP Readings from Last 3 Encounters:  04/20/20 118/64  11/30/19 (!) 134/53  10/17/19 133/65    Allergies  Allergen Reactions  . Latex Itching and Rash    With latex gloves (when worn)    Medications Reviewed Today    Reviewed by De Hollingshead, [facility] (Pharmacist) on 04/27/20 at [place] List Status: <None>  Medication Order Taking? Sig Documenting Provider Last Dose Status Informant  acetaminophen (TYLENOL) 325 MG tablet [id number] Yes Take 325 mg by mouth every 6 (six) hours as needed (for pain.). [provider] Taking Active Self  albuterol (VENTOLIN HFA) 108 (90 Base) MCG/ACT inhaler [id number] Yes Inhale 2 puffs into the lungs every 6 (six) hours as needed for wheezing or shortness of breath. Marnee Guarneri T, NP Taking Active   amLODipine (NORVASC) 5 MG tablet [id number] Yes Take 1 tablet (5 mg total) by mouth daily. Venita Lick, NP Taking Active   aspirin  EC 81 MG tablet [id number] Yes Take 81 mg by mouth daily. [provider] Taking Active Self  atorvastatin (LIPITOR) 10 MG tablet [id number] Yes Take 1 tablet (10 mg total) by mouth daily. Marnee Guarneri T, NP Taking Active   hydrochlorothiazide (MICROZIDE) 12.5 MG capsule [id number] Yes Take 1 capsule (12.5 mg total) by mouth daily. Marnee Guarneri T, NP Taking Active   lisinopril (ZESTRIL) 40 MG tablet [id number] Yes Take 1 tablet (40 mg total) by mouth daily. Marnee Guarneri T, NP Taking Active   umeclidinium-vilanterol (ANORO ELLIPTA) 62.5-25 MCG/INH AEPB [id number] No Inhale 1 puff into the lungs daily.  Patient  not taking: Reported on 04/27/2020   Marnee Guarneri T, NP Not Taking Active   vitamin B-12 (CYANOCOBALAMIN) 1000 MCG tablet [id number] Yes Take 1,000 mcg by mouth daily. [provider] Taking Active   Vitamin D, Cholecalciferol, 1000 units TABS [id number] Yes Take 1 tablet by mouth daily. [provider] Taking Active Self           Assessment:   Goals Addressed              This Visit's Progress     Patient Stated   .  PharmD "I can't afford my medication" (pt-stated)        CARE PLAN ENTRY (see longitudinal plan of care for additional care plan information)  Current Barriers:  . Polypharmacy; complex patient with multiple comorbidities including COPD, HLD, HTN . Financial concerns - patient reports today that she cannot afford Anoro . Most recent eGFR: ~ 40 mL/min - last saw nephrology Dr. Holley Raring in 2019 o COPD: Anoro prescribed, but too expensive so she does not have. Albuterol HFA PRN, usually 1-2 times daily o VFI:EPPIRJJOAC 5 mg daily, HCTZ 12.5 mg daily, lisinopril 40 mg o ASCVD risk reduction: atorvastatin 10 mg daily; last LDL 105. Reports adherence. PCP noted she wanted to increase to 20 mg daily, but does not appear patient was informed; ASA 81 mg daily o Supplements: Vitamin B12, vitamin D  Pharmacist Clinical Goal(s):  Marland Kitchen Over the next 90 days, patient will work with PharmD and provider towards optimized medication management  Interventions: . Comprehensive medication review performed; medication list updated in electronic medical record . Inter-disciplinary care team collaboration (see longitudinal plan of care) . Discussed income. Patient meets income requirement for Anoro through [place], but has not spent the required $600 out of pocket on her prescriptions. She would meet requirements for Stiolto through [place]. Will collaborate w/ PCP to order Stiolto 2.5/2.5 mcg 2 puffs daily, No Print. Will collaborate w/ CPhT to mail patient portion of the  application. Will collaborate w/ PCP for provider portion. Once all parts received, will submit to [facility].  . Discussed lipids. Patient amenable to increasing atorvastatin. She will take 2 of the 10 mg tabs until completed, I will collaborate w/ PCP to order atorvastatin 20 mg daily . She is amenable to getting back in w/ nephrology Dr. Holley Raring. Will collaborate w/ PCP for referral  Patient Self Care Activities:  . Patient will take medications as prescribed  Initial goal documentation        Plan: - Scheduled f/u call in ~4 weeks  Catie Darnelle Maffucci, PharmD, [place] [phone]

## 2020-05-07 ENCOUNTER — Other Ambulatory Visit: Payer: Self-pay | Admitting: Pharmacy Technician

## 2020-05-07 NOTE — Patient Outreach (Signed)
[facility]) Care Management  05/07/2020  KYRIANA YANKEE Oct 26, 1945 [id number]  Incoming call received from Elenore Rota and Olin Pia in regards to [facility] application for [employer].  Spoke to [place] and then to [place], [employer] verified.  Elenore Rota was assisting his spouse in filling out the [facility] application and had a question about the application process. Was able to assist patient with the answer to his question while on the line. He informed they would have in mail this week.  Will route note to embedded [place] if application is not back within 15 business days.  Mayra Brahm P. Damian Buckles, [place]  [phone]

## 2020-05-15 ENCOUNTER — Other Ambulatory Visit: Payer: Self-pay | Admitting: Pharmacy Technician

## 2020-05-15 NOTE — Patient Outreach (Signed)
[facility]) Care Management  05/15/2020  Madison Franklin May 26, 1946 [id number]   Received both patient and provider portion(s) of patient assistance application(s) for [employer]. Faxed completed application and required documents into BI.  Will follow up with company(ies) in 5-10 business days to check status of application(s).  Madison Franklin, [place]  [phone]

## 2020-05-16 ENCOUNTER — Telehealth: Payer: Self-pay | Admitting: Pharmacist

## 2020-05-16 ENCOUNTER — Other Ambulatory Visit: Payer: Self-pay | Admitting: Nurse Practitioner

## 2020-05-16 ENCOUNTER — Telehealth: Payer: Self-pay | Admitting: Nurse Practitioner

## 2020-05-16 ENCOUNTER — Telehealth: Payer: Self-pay

## 2020-05-16 DIAGNOSIS — I10 Essential (primary) hypertension: Secondary | ICD-10-CM

## 2020-05-16 DIAGNOSIS — F17200 Nicotine dependence, unspecified, uncomplicated: Secondary | ICD-10-CM

## 2020-05-16 NOTE — Telephone Encounter (Signed)
Returned call to [employer] and [place] answered the phone. Spoke with her. Message relayed

## 2020-05-16 NOTE — Telephone Encounter (Signed)
Yes, please call back and confirm Stiolto 2 puffs daily (as is documented on her medication list), this is the pharmacy that dispenses the [place] patient assistance   Thanks!

## 2020-05-16 NOTE — Telephone Encounter (Signed)
  Chronic Care Management   Note  05/16/2020 Name: DIARA CHAUDHARI MRN: [id number] DOB: 01-Jun-1946  Received notice from [place] that patient needs to apply for Medicare Extra Help based upon the income that was submitted for [facility] patient assistance.   Attempted to contact patient to discuss. Left HIPAA compliant message for patient to return my call at their convenience.    Follow up plan: - If I do not hear back today, will outreach again in ~2-3 business days  Catie Darnelle Maffucci, PharmD, [employer] [phone]

## 2020-05-16 NOTE — Telephone Encounter (Signed)
Katie, do you know what this is supposed to be?

## 2020-05-16 NOTE — Telephone Encounter (Signed)
Caller name: Romina  Relation to pt: [place]  Call back number: [phone]    Reason for call:  In need of clarity regarding "stiolto" inhaler stating the rx reflects 2 puffs 2x daily and typically its 2 puffs 1x daily, please advise (chart does not reflect)

## 2020-05-21 ENCOUNTER — Other Ambulatory Visit: Payer: Self-pay | Admitting: Pharmacy Technician

## 2020-05-21 DIAGNOSIS — I10 Essential (primary) hypertension: Secondary | ICD-10-CM

## 2020-05-21 DIAGNOSIS — I2584 Coronary atherosclerosis due to calcified coronary lesion: Secondary | ICD-10-CM

## 2020-05-21 DIAGNOSIS — I2510 Atherosclerotic heart disease of native coronary artery without angina pectoris: Secondary | ICD-10-CM

## 2020-05-21 DIAGNOSIS — J449 Chronic obstructive pulmonary disease, unspecified: Secondary | ICD-10-CM

## 2020-05-21 NOTE — Patient Outreach (Signed)
[facility] [facility]) Care Management  05/21/2020  Madison Franklin June 04, 1946 [id number]   Placed Care Guide referral for assistance w/ Medicare Extra Help application.   Catie Darnelle Maffucci, PharmD, [place] [phone]

## 2020-05-21 NOTE — Patient Outreach (Signed)
[facility]) Care Management  05/21/2020  Madison Franklin [birth year] [id number]  Care coordination call placed to [place] in regards to patient's application for Stiolto.  Spoke to [name] who informed patient has temporarily been approved 05/16/2020. A 3 month gratis fill is scheduled to be delivered to the patient on 05/22/2020 per [name] at [facility].   However, in order for patient to be fully approved, she will need to apply for LIS based on the income submitted on the application. [name] informed if patient is approved, then she would not be eligible for the program. However, if patient is denied for LIS, then a copy of the letter would need to be faxed into BI. If patient receives partial subsidy, then the letter along with the out of pocket for the requested medication would need to be faxed into BI.   [facility] embedded [place] attempted to outreach patient on 05/16/2020 to help with the the LIS application process but unfortunately patient did not answer the phone. In basket message sent to pharmacist as she is no longer at the clinic as of 05/20/2020 to inquire how to proceed.  Will await a return message.  [name], [place]  [phone]

## 2020-05-28 ENCOUNTER — Encounter: Payer: Self-pay | Admitting: Pulmonary Disease

## 2020-05-28 ENCOUNTER — Ambulatory Visit: Payer: Medicare Other | Admitting: Pulmonary Disease

## 2020-05-28 ENCOUNTER — Other Ambulatory Visit: Payer: Self-pay

## 2020-05-28 VITALS — BP 124/78 | HR 89 | Temp 97.8°F | Ht 61.0 in | Wt 151.0 lb

## 2020-05-28 DIAGNOSIS — R0602 Shortness of breath: Secondary | ICD-10-CM

## 2020-05-28 DIAGNOSIS — R0683 Snoring: Secondary | ICD-10-CM | POA: Diagnosis not present

## 2020-05-28 DIAGNOSIS — R011 Cardiac murmur, unspecified: Secondary | ICD-10-CM

## 2020-05-28 DIAGNOSIS — R0681 Apnea, not elsewhere classified: Secondary | ICD-10-CM | POA: Diagnosis not present

## 2020-05-28 NOTE — Progress Notes (Signed)
[facility]  [address] [place], [place] [address] Phone: [phone]  Fax: [phone]   Clinic Day:  05/29/2020  Referring physician: Venita Lick, NP  Chief Complaint: Madison Franklin is a 74 y.o. female with anemia of chronic kidney disease, iron deficiency, and B12 deficiency who is seen for 6 month assessment.  HPI: The patient was last seen in the hematology clinic on 11/30/2019. At that time, she felt "alright".  She denied any bleeding.  Exam was stable. Hematocrit was 35.7, hemoglobin 10.8, MCV 82.6, platelets 276,000, WBC 8,600.  Creatinine was 1.43 (CrCl 41 ml/min). Ferritin was 104 with an iron saturation of 15% and a TIBC of 297.  Labs on 02/27/2020 revealed a hematocrit 39.5, hemoglobin 11.8, platelets 181,000, WBC 14,900 (ANC 10,900).  Ferritin 45.  The patient saw Dr. Patsey Berthold on 05/28/2020. The patient states that Dr. Patsey Berthold told the patient to use her inhalers.  She had labs drawn.  During the interim, she has been "fine." Her breathing is about the same; she gets short of breath on exertion. She is eating well and denies nausea, vomiting, and diarrhea. She is trying to lose weight but she does not have a goal weight. She denies any gall bladder problems. She still takes oral B12.   Past Medical History:  Diagnosis Date  . COPD (chronic obstructive pulmonary disease) ([place])   . Dyspnea   . Hearing aid worn    bilateral  . Hyperlipidemia   . Hypertension   . Osteoporosis   . Personal history of tobacco use, presenting hazards to health 08/15/2015  . Pulmonary hypertension, mild (H[facility])    mild to moderate    Past Surgical History:  Procedure Laterality Date  . ABDOMINAL HYSTERECTOMY    . COLONOSCOPY WITH PROPOFOL N/A 11/14/2016   Procedure: COLONOSCOPY WITH PROPOFOL;  Surgeon: [name], MD;  Location: [place];  Service: Endoscopy;  Laterality: N/A;  . COLONOSCOPY WITH PROPOFOL N/A 11/10/2018   Procedure:  COLONOSCOPY WITH PROPOFOL;  Surgeon: Lin Landsman, MD;  Location: [place];  Service: Endoscopy;  Laterality: N/A;  . ESOPHAGOGASTRODUODENOSCOPY (EGD) WITH PROPOFOL  11/10/2018   Procedure: ESOPHAGOGASTRODUODENOSCOPY (EGD) WITH PROPOFOL;  Surgeon: Lin Landsman, MD;  Location: [place];  Service: Endoscopy;;  . ESOPHAGOGASTRODUODENOSCOPY (EGD) WITH PROPOFOL N/A 05/11/2019   Procedure: ESOPHAGOGASTRODUODENOSCOPY (EGD) WITH PROPOFOL;  Surgeon: Lin Landsman, MD;  Location: [place];  Service: Endoscopy;  Laterality: N/A;  . long nodule    . POLYPECTOMY  11/14/2016   Procedure: POLYPECTOMY;  Surgeon: [name], MD;  Location: [place];  Service: Endoscopy;;  . POLYPECTOMY  11/10/2018   Procedure: POLYPECTOMY;  Surgeon: Lin Landsman, MD;  Location: [place];  Service: Endoscopy;;  . RIGHT HEART CATH Right 07/14/2017   Procedure: RIGHT HEART CATH;  Surgeon: Nelva Bush, MD;  Location: [facility] [facility];  Service: Cardiovascular;  Laterality: Right;    Family History  Problem Relation Age of Onset  . Heart disease Neg Hx     Social History:  reports that she quit smoking about 3 years ago. Her smoking use included cigarettes. She has a 15.00 pack-year smoking history. She has never used smokeless tobacco. She reports that she does not drink alcohol and does not use drugs. She smoked 1/2 pack/day x 30 years. She quit in 2018. She is a retired Secretary/administrator. Her husband's name is Elenore Rota. Her daughter Levada Dy can be reached over the phone (  [phone]). The patient is alone today.   Allergies:  Allergies  Allergen Reactions  . Latex Itching and Rash    With latex gloves (when worn)    Current Medications: Current Outpatient Medications  Medication Sig Dispense Refill  . acetaminophen (TYLENOL) 325 MG tablet Take 325 mg by mouth every 6 (six) hours as needed (for pain.).    Marland Kitchen albuterol (VENTOLIN HFA) 108 (90  Base) MCG/ACT inhaler Inhale 2 puffs into the lungs every 6 (six) hours as needed for wheezing or shortness of breath. 16 g 3  . amLODipine (NORVASC) 5 MG tablet Take 1 tablet (5 mg total) by mouth daily. 90 tablet 0  . aspirin EC 81 MG tablet Take 81 mg by mouth daily.    Marland Kitchen atorvastatin (LIPITOR) 20 MG tablet Take 1 tablet (20 mg total) by mouth daily. 90 tablet 3  . hydrochlorothiazide (MICROZIDE) 12.5 MG capsule Take 1 capsule (12.5 mg total) by mouth daily. 90 capsule 0  . lisinopril (ZESTRIL) 40 MG tablet Take 1 tablet (40 mg total) by mouth daily. 90 tablet 0  . Tiotropium Bromide-Olodaterol 2.5-2.5 MCG/ACT AERS Inhale 2 puffs into the lungs daily. 4 g 6  . vitamin B-12 (CYANOCOBALAMIN) 1000 MCG tablet Take 1,000 mcg by mouth daily.    . Vitamin D, Cholecalciferol, 1000 units TABS Take 1 tablet by mouth daily.     No current facility-administered medications for this visit.    Review of Systems  Constitutional: Negative for chills, diaphoresis, fever, malaise/fatigue and weight loss (trying to lose weight, no goal weight).       Feels "fine".  HENT: Positive for hearing loss. Negative for congestion, ear discharge, ear pain, nosebleeds, sinus pain, sore throat and tinnitus.   Eyes: Negative.  Negative for blurred vision, double vision and photophobia.  Respiratory: Positive for shortness of breath (on exertion, stable). Negative for cough, hemoptysis, sputum production and wheezing.   Cardiovascular: Negative.  Negative for chest pain, palpitations, orthopnea, leg swelling and PND.  Gastrointestinal: Negative.  Negative for abdominal pain, blood in stool, constipation, diarrhea, heartburn, melena, nausea and vomiting.       Eating well.  Genitourinary: Negative.  Negative for dysuria, frequency, hematuria and urgency.       CKD-III.  Musculoskeletal: Negative.  Negative for back pain, joint pain, myalgias and neck pain.  Skin: Negative.  Negative for itching and rash.  Neurological:  Negative.  Negative for dizziness, tingling, sensory change, weakness and headaches.  Endo/Heme/Allergies: Negative.  Does not bruise/bleed easily.  Psychiatric/Behavioral: Negative.  Negative for depression and memory loss. The patient is not nervous/anxious and does not have insomnia.   All other systems reviewed and are negative.  Performance status (ECOG):  1  Vitals Blood pressure (!) 149/83, pulse 85, resp. rate 18, weight 149 lb 14.6 oz (68 kg), SpO2 95 %.   Physical Exam Vitals and nursing note reviewed.  Constitutional:      General: She is not in acute distress.    Appearance: Normal appearance. She is well-developed. She is not diaphoretic.  HENT:     Head: Normocephalic and atraumatic.     Ears:     Comments: Hearing aid.    Mouth/Throat:     Mouth: Mucous membranes are moist.     Pharynx: Oropharynx is clear. No oropharyngeal exudate.  Eyes:     General: Lids are normal. No scleral icterus.    Extraocular Movements: Extraocular movements intact.     Conjunctiva/sclera: Conjunctivae normal.  Pupils: Pupils are equal, round, and reactive to light.  Neck:     Vascular: No carotid bruit or JVD.  Cardiovascular:     Rate and Rhythm: Normal rate and regular rhythm.     Heart sounds: Murmur (3/6) heard.   Pulmonary:     Effort: Pulmonary effort is normal. No respiratory distress.     Breath sounds: Normal breath sounds. No wheezing or rales.  Chest:     Chest wall: No tenderness.  Abdominal:     General: Bowel sounds are normal. There is no distension.     Palpations: Abdomen is soft. There is no hepatomegaly, splenomegaly or mass.     Tenderness: There is no abdominal tenderness. There is no guarding or rebound.  Musculoskeletal:        General: Normal range of motion.     Cervical back: Normal range of motion.  Lymphadenopathy:     Head:     Right side of head: No preauricular or posterior auricular adenopathy.     Left side of head: No preauricular,  posterior auricular or occipital adenopathy.     Cervical: No cervical adenopathy.     Upper Body:     Right upper body: No supraclavicular adenopathy.     Left upper body: No supraclavicular adenopathy.     Lower Body: No right inguinal adenopathy. No left inguinal adenopathy.  Skin:    General: Skin is warm and dry.     Coloration: Skin is not pale.     Findings: No erythema or rash.  Neurological:     Mental Status: She is alert and oriented to person, place, and time.  Psychiatric:        Behavior: Behavior normal.        Thought Content: Thought content normal.        Judgment: Judgment normal.    Appointment on 05/29/2020  Component Date Value Ref Range Status  . WBC 05/29/2020 9.5  4.0 - 10.5 K/uL Final  . RBC 05/29/2020 4.59  3.87 - 5.11 MIL/uL Final  . Hemoglobin 05/29/2020 11.7* 12.0 - 15.0 g/dL Final  . HCT 05/29/2020 38.5  36 - 46 % Final  . MCV 05/29/2020 83.9  80.0 - 100.0 fL Final  . MCH 05/29/2020 25.5* 26.0 - 34.0 pg Final  . MCHC 05/29/2020 30.4  30.0 - 36.0 g/dL Final  . RDW 05/29/2020 15.0  11.5 - 15.5 % Final  . Platelets 05/29/2020 202  150 - 400 K/uL Final  . nRBC 05/29/2020 0.0  0.0 - 0.2 % Final  . Neutrophils Relative % 05/29/2020 73  % Final  . Neutro Abs 05/29/2020 6.9  1.7 - 7.7 K/uL Final  . Lymphocytes Relative 05/29/2020 16  % Final  . Lymphs Abs 05/29/2020 1.5  0.7 - 4.0 K/uL Final  . Monocytes Relative 05/29/2020 8  % Final  . Monocytes Absolute 05/29/2020 0.7  0 - 1 K/uL Final  . Eosinophils Relative 05/29/2020 2  % Final  . Eosinophils Absolute 05/29/2020 0.2  0 - 0 K/uL Final  . Basophils Relative 05/29/2020 1  % Final  . Basophils Absolute 05/29/2020 0.1  0 - 0 K/uL Final  . Immature Granulocytes 05/29/2020 0  % Final  . Abs Immature Granulocytes 05/29/2020 0.04  0.00 - 0.07 K/uL Final   Performed at [facility], [address], [place], [place] [address]    Assessment:  JENIKA CHIEM is a 74 y.o. female  with anemia of  chronic kidney disease. She has had a gradual decline in renal function of the course of the last 8 months  Work-up on 08/07/2019revealed a hematocrit of 33.5, hemoglobin 10.3, and MCV 78.4. Ferritinwas 20. Iron saturation was 8% with a TIBC of 395. B12was 259 (low). Folate was 10.3. Creatinine was 1.51.  She hasiron deficiency anemiaandB12 deficiency. She is on oral B12. B12 was 259 on 05/26/2018 and 1080 on 08/03/2018. Anti-parietal antibody and intrinsic factor antibodies were negative on 06/30/2018. B12 was discontinued on 12/15/2018.  Oral ironbegan 08/04/2018 and discontinued on 04/27/2019.  Ferritinhas been followed: 31 on 11/24/2017, 20 on 05/26/2018, 17 on 06/30/2018, 27 on 08/03/2018, 34 on 08/31/2018, 33 on 10/11/2018, 46 on 11/23/2018, 129 on 03/23/2019, 86 on 05/25/2019, 76 on 08/25/2019, 104 on 11/30/2019, and 45 on 02/27/2020.  She has a history of colonic polyps. Colonoscopyon 11/14/2016 revealed a sub-optimal prep. Findings included a 10 mm polyp in the cecum, seven 4-10 mm sessile polyps in the transverse colon, a 6 mm polyp in the descending colon, three 5-6 mm sessile polyps in the sigmoid colon,. Pathology revealed 7 tubular adenomas, 2 hyperplastic polyps, and 2 inflammatory polyps. Colonoscopyon 11/10/2018 revealed three 5 to 6 mm polyps in the ascending colon(2 tubular adenomas; sessile serrated adenoma),one 8 mm polyp in the transverse colon(tubular adenoma),one 5 mm polyp in the transverse colon(tubular adenoma),one 5 mm polyp in the sigmoid colon(tubular adenoma),one 7 mm polyp at the recto-sigmoid colon(hyperplastic polyp),and non-bleeding external and internal hemorrhoids.She will have a follow-up colonoscopy in 3 years.  EGDon 11/10/2018 revealed non-bleeding erosive gastropathy. Pathology revealed chronic inactive atrophic gastritis with intestinal metaplasia. There was a single non-bleeding erosion in the upper third of the  esophagus.  EGD on 05/11/2019 revealed normal duodenal bulb and second portion of the duodenum. There was non-bleeding erosive gastropathy and gastric mucosal atrophy.  There was a normal gastroesophageal junction and esophagus. Pathology revealed chronic inactive atrophic gastritis with intestinal metaplasia, negative for H pylori.   She has a 15 pack year smoking history. Chest CTon 03/13/2017 revealed a 4 mm LUL pulmonary nodule (decreased from prior). Low dose chest CTon 06/01/2018 revealed Lung-RADS 2, benign appearance or behavior. There was gallbladder wall calcifications indicative of "porcelain gallbladder".   Abdomen and pelvic CTon 07/25/2018 revealed a chronically abnormal gallbladder with circumferential partially calcified wall thickening and heterogeneity. This was partially visible in 2016 and 2015. There appears to be an exophytic 3 cm component of calcified soft tissue extending inferiorly from the main body of the gallbladder. Obliterated lumen perhaps that is gallbladder fundus is uncertain.  She has stage III chronic renal insufficiency.  Creatinine was 1.27 on 05/25/2019.  The patient received the [place] COVID-19 vaccine on 12/15/2019 and 01/11/2020.  Symptomatically, she feels fine.  She has shortness of breath on exertion.  Exam is stable.  Plan: 1.   Labs today: CBC with diff, creatinine, ferritin, iron studies. 2.   Anemia of chronic renal disease Hematocrit 38.5.  Hemoglobin 11.7.  MCV 83.9.  She continues to do well without the need for Retacrit. Continue to monitor 3.   Iron deficiency Ferritin  39. Iron saturation 19 % with a TIBC of 360. Patient currently off oral iron.   May need to reinstitute in the near future. Continue to monitor.  4.   B12 deficiency B12 level was 462 and folate 19.8 on 05/25/2019. She is on oral B12.   B12 goal > 400. Check B12 and folate annually. 5.   Porcelain gallbladder Patient has a  prominent RUQ on exam c/w  porcelain gallbladder. She is followed by Dr. Dahlia Byes. No intervention is planned unless she is symptomatic. 6.   No Retacrit today. 7.   RTC in 3 months for labs (CBC with diff, ferritin, B12, folate) and +/- Retacrit. 8.   RTC in 6 months for MD assessment and labs (CBC with diff, creatinine, ferritin, iron studies) and +/- Retacrit.   I discussed the assessment and treatment plan with the patient.  The patient was provided an opportunity to ask questions and all were answered.  The patient agreed with the plan and demonstrated an understanding of the instructions.  The patient was advised to call back if the symptoms worsen or if the condition fails to improve as anticipated.   Lequita Asal, MD, PhD    05/29/2020, 9:57 AM  I, De Burrs, am acting as scribe for [employer]. Mike Gip, MD, PhD.  I, Melissa C. Mike Gip, MD, have reviewed the above documentation for accuracy and completeness, and I agree with the above.

## 2020-05-28 NOTE — Progress Notes (Signed)
Subjective:    Patient ID: Madison Franklin, female    DOB: September 18, 1946, 74 y.o.   MRN: [id number]  Requesting MD/Service: [name], NP Date of initial consultation: 05/11/17 by Dr. Gwendolyn Lima Reason for consultation: Former smoker, exertional dyspnea  PT PROFILE: 74 y.o. female former smoker (approx 15 P-Y history) referred for evaluation of exertional dyspnea  PROBLEMS: Former smoker Severe thoracic kyphoscoliosis Moderate to severe restrictive pulmonary physiology Moderate pulmonary hypertension   DATA: CT chest 03/13/17: Severe thoracic kyphoscoliosis. 4 mm nodule in left upper lobe (decreased in size from previously). Minimal emphysema Echocardiogram 05/28/17: LVEF 60-65%.  Grade 1 diastolic dysfunction.  No significant valvular disease noted.  No evidence of right-sided overload. [place] 07/14/17: Mild to moderate pulmonary hypertension (mean PAP 34 mmHg, PVR 5.7 Wood units) PFTs 10/01/17: FVC: 1.28 L (49 %pred), FEV1: 0.83 > 0.91 L (41 > 45 %pred), FEV1/FVC: 65%; lung volume measurements invalid, DLCO 6.6 (31 %pred) ONO 10/26/17:  Minimal brief desaturation. Does not require nocturnal O2 LDCT chest 06/01/18: Lung-RADS 2, benign appearance or behavior. Aortic atherosclerosis, coronary artery atherosclerosis and emphysema. Pulmonary artery enlargement suggests pulmonary arterial hypertension. Gallbladder wall calcifications indicative of "porcelain gallbladder". Very large thyroid gland extending into upper mediastinum.  INTERVAL: Last seen 09/21/2018 by Dr. Merton Border. No major pulmonary events.  Patient had not followed up at scheduled.   HPI The patient is a very complex 74 year old former smoker prior patient of Dr. Merton Border.  She last saw Dr. Alva Garnet as noted above on 21 September 2018.  She presents for evaluation of ongoing issues with dyspnea.  She has been evaluated previously by Dr. Alva Garnet for these issues.  She has known pulmonary hypertension and moderate to  severe restrictive physiology due to significant thoracic kyphoscoliosis.  The patient is a very challenging historian and notes that she has issues with severe dyspnea and fatigue that ensue after walking between 50 to 100 feet.  This has been a chronic issue for her but has become somewhat worse.  She has been on Ventolin inhaler without any significant help.  She also has been on Stiolto with minimal improvement.  Review of her records show a hypertension documented by right heart cath in 2018 at that time her pulmonary hypertension was classified as mild to moderate.  Diastolic dysfunction also by echo in 2018.  Her PFTs from December 2018 showed a mostly obstructive picture but could not exclude combined restriction.  The patient performed lung volumes however these were invalid.  The patient does not endorse chest pain.  She has had no cough.  Her main issue is that of dyspnea and fatigue.  She describes nonrestorative sleep and loud snoring.  States that she has had apneic episodes.  Review of Systems A 10 point review of systems was performed and it is as noted above otherwise negative.  Current Meds  Medication Sig  . acetaminophen (TYLENOL) 325 MG tablet Take 325 mg by mouth every 6 (six) hours as needed (for pain.).  Marland Kitchen albuterol (VENTOLIN HFA) 108 (90 Base) MCG/ACT inhaler Inhale 2 puffs into the lungs every 6 (six) hours as needed for wheezing or shortness of breath.  Marland Kitchen amLODipine (NORVASC) 5 MG tablet Take 1 tablet (5 mg total) by mouth daily.  Marland Kitchen aspirin EC 81 MG tablet Take 81 mg by mouth daily.  Marland Kitchen atorvastatin (LIPITOR) 20 MG tablet Take 1 tablet (20 mg total) by mouth daily.  . hydrochlorothiazide (MICROZIDE) 12.5 MG capsule Take 1 capsule (12.5 mg  total) by mouth daily.  Marland Kitchen lisinopril (ZESTRIL) 40 MG tablet Take 1 tablet (40 mg total) by mouth daily.  . Tiotropium Bromide-Olodaterol 2.5-2.5 MCG/ACT AERS Inhale 2 puffs into the lungs daily.  . vitamin B-12 (CYANOCOBALAMIN) 1000 MCG  tablet Take 1,000 mcg by mouth daily.  . Vitamin D, Cholecalciferol, 1000 units TABS Take 1 tablet by mouth daily.  . [DISCONTINUED] umeclidinium-vilanterol (ANORO ELLIPTA) 62.5-25 MCG/INH AEPB Inhale 1 puff into the lungs daily.   Allergies  Allergen Reactions  . Latex Itching and Rash    With latex gloves (when worn)   Immunization History  Administered Date(s) Administered  . Influenza, High Dose Seasonal PF 09/03/2016  . Influenza,inj,Quad PF,6+ Mos 07/27/2015  . Influenza,inj,quad, With Preservative 08/24/2018, 07/15/2019  . Influenza-Unspecified 08/14/2017, 08/24/2018  . PFIZER SARS-COV-2 Vaccination 12/15/2019, 01/11/2020  . Pneumococcal Conjugate-13 01/12/2015  . Pneumococcal Polysaccharide-23 07/01/2011  . Td 07/27/2015       Objective:   Physical Exam BP 124/78 (BP Location: Left Arm, Cuff Size: Normal)   Pulse 89   Temp 97.8 F (36.6 C) (Temporal)   Ht 5\' 1"  (1.549 m)   Wt 151 lb (68.5 kg)   LMP  (LMP Unknown)   SpO2 96%   BMI 28.53 kg/m  GENERAL: Awake alert, no distress. HEAD: Normocephalic, atraumatic.  Wears hearing aids. EYES: Pupils equal, round, reactive to light.  No scleral icterus.  MOUTH: Nose/mouth/throat not examined due to masking requirements for COVID 19. NECK: Supple.  Thyroid diffusely enlarged.  Deviated to the right. No JVD.  No adenopathy. PULMONARY: Distant breath sounds, coarse, no other adventitious sounds.Marland Kitchen CARDIOVASCULAR: S1 and S2. Regular rate and rhythm.  Loud holosystolic murmur grade 4/6 best heard at the left sternal border  ABDOMEN: Soft, nondistended, nontender. MUSCULOSKELETAL: Very severe kyphoscoliosis. NEUROLOGIC: No focal deficits. SKIN: Intact,warm,dry.  No overt rashes noted. PSYCH: Flat affect, normal behavior  Ambulatory oximetry: This was performed today, initial oxygen saturation was 98%, O2 sat 95%.  Patient ambulated only 250 feet.  Could not do further labs due to fatigue and shortness of breath.      Assessment & Plan:     ICD-10-CM   1. SOB (shortness of breath)  R06.02 ECHOCARDIOGRAM COMPLETE    Pulmonary Function Test [facility] Only    DG Chest 2 View   Suspect combination of restrictive physiology due to kyphoscoliosis Other potential issues is pulmonary hypertension PFTs, 2D echo Lesser component of COPD  2. Snoring  R06.83 Split night study   Split-night study  3. Witnessed episode of apnea  R06.81 Split night study   Split-night study  4. Heart murmur  R01.1 ECHOCARDIOGRAM COMPLETE   Loud cardiac murmur Suspect tricuspid regurgitation 2D echo   Orders Placed This Encounter  Procedures  . DG Chest 2 View    Standing Status:   Future    Standing Expiration Date:   11/28/2020    Order Specific Question:   Reason for Exam (SYMPTOM  OR DIAGNOSIS REQUIRED)    Answer:   SOB    Order Specific Question:   Preferred imaging location?    Answer:   [facility]  . Pulmonary Function Test [facility] Only    Standing Status:   Future    Number of Occurrences:   1    Standing Expiration Date:   05/28/2021    Order Specific Question:   Full PFT: includes the following: basic spirometry, spirometry pre & post bronchodilator, diffusion capacity (DLCO), lung volumes    Answer:  Full PFT  . ECHOCARDIOGRAM COMPLETE    Standing Status:   Future    Number of Occurrences:   1    Standing Expiration Date:   05/28/2021    Order Specific Question:   Where should this test be performed    Answer:   [facility]    Order Specific Question:   Please indicate who you request to read the echo results.    Answer:   [facility]    Order Specific Question:   Perflutren DEFINITY (image enhancing agent) should be administered unless hypersensitivity or allergy exist    Answer:   Administer Perflutren    Order Specific Question:   Reason for exam-Echo    Answer:   Dyspnea  786.09 / R06.00  . Split night study    Standing Status:   Future    Standing Expiration Date:   05/28/2021    Order  Specific Question:   Where should this test be performed:    Answer:   [facility]   Discussion: The patient's dyspnea is likely related to her restrictive physiology from severe kyphoscoliosis.  Examination today is concerning for worsening pulmonary hypertension given tricuspid regurgitation murmur and increasing dyspnea.  She has had loud snoring and witnessed apneas per her husband.  We will proceed with obtaining split-night study.  Will obtain a 2D echo and pulmonary function testing.  The patient has not had any imaging performed since 2018 we will proceed with chest x-ray to initiate a work-up as to her dyspnea issues.  She is to follow-up in 4 to 6 weeks time she is to contact us prior to that time should any new difficulties arise.  Total visit time: 55 minutes   C. Derrill Kay, MD [facility]   *This note was dictated using voice recognition software/Dragon.  Despite best efforts to proofread, errors can occur which can change the meaning.  Any change was purely unintentional.

## 2020-05-28 NOTE — Patient Instructions (Signed)
What we discussed today:  We are going to get breathing tests  We are going to get a sleep study  We are also checking your heart echocardiogram  We are also going to get a chest x-ray  Continue taking the Stiolto 2 sprays once a day  Continue using albuterol (dark blue inhaler) as needed  We will see you in follow-up in 4 to 6 weeks time call sooner should any new problems arise

## 2020-05-29 ENCOUNTER — Inpatient Hospital Stay: Payer: Medicare Other | Attending: Hematology and Oncology

## 2020-05-29 ENCOUNTER — Other Ambulatory Visit: Payer: Medicare Other

## 2020-05-29 ENCOUNTER — Telehealth: Payer: Self-pay

## 2020-05-29 ENCOUNTER — Encounter: Payer: Self-pay | Admitting: Hematology and Oncology

## 2020-05-29 ENCOUNTER — Inpatient Hospital Stay (HOSPITAL_BASED_OUTPATIENT_CLINIC_OR_DEPARTMENT_OTHER): Payer: Medicare Other | Admitting: Hematology and Oncology

## 2020-05-29 ENCOUNTER — Ambulatory Visit: Payer: Medicare Other | Admitting: Hematology and Oncology

## 2020-05-29 VITALS — BP 149/83 | HR 85 | Resp 18 | Wt 149.9 lb

## 2020-05-29 DIAGNOSIS — D509 Iron deficiency anemia, unspecified: Secondary | ICD-10-CM | POA: Diagnosis not present

## 2020-05-29 DIAGNOSIS — J449 Chronic obstructive pulmonary disease, unspecified: Secondary | ICD-10-CM | POA: Insufficient documentation

## 2020-05-29 DIAGNOSIS — D631 Anemia in chronic kidney disease: Secondary | ICD-10-CM

## 2020-05-29 DIAGNOSIS — E611 Iron deficiency: Secondary | ICD-10-CM | POA: Insufficient documentation

## 2020-05-29 DIAGNOSIS — Z87891 Personal history of nicotine dependence: Secondary | ICD-10-CM | POA: Diagnosis not present

## 2020-05-29 DIAGNOSIS — E538 Deficiency of other specified B group vitamins: Secondary | ICD-10-CM | POA: Diagnosis not present

## 2020-05-29 DIAGNOSIS — Z8249 Family history of ischemic heart disease and other diseases of the circulatory system: Secondary | ICD-10-CM | POA: Insufficient documentation

## 2020-05-29 DIAGNOSIS — N183 Chronic kidney disease, stage 3 unspecified: Secondary | ICD-10-CM

## 2020-05-29 DIAGNOSIS — E785 Hyperlipidemia, unspecified: Secondary | ICD-10-CM | POA: Insufficient documentation

## 2020-05-29 DIAGNOSIS — Z7982 Long term (current) use of aspirin: Secondary | ICD-10-CM | POA: Insufficient documentation

## 2020-05-29 DIAGNOSIS — K828 Other specified diseases of gallbladder: Secondary | ICD-10-CM

## 2020-05-29 DIAGNOSIS — I10 Essential (primary) hypertension: Secondary | ICD-10-CM | POA: Insufficient documentation

## 2020-05-29 DIAGNOSIS — Z79899 Other long term (current) drug therapy: Secondary | ICD-10-CM | POA: Insufficient documentation

## 2020-05-29 LAB — CBC WITH DIFFERENTIAL/PLATELET
Abs Immature Granulocytes: 0.04 10*3/uL (ref 0.00–0.07)
Basophils Absolute: 0.1 10*3/uL (ref 0.0–0.1)
Basophils Relative: 1 %
Eosinophils Absolute: 0.2 10*3/uL (ref 0.0–0.5)
Eosinophils Relative: 2 %
HCT: 38.5 % (ref 36.0–46.0)
Hemoglobin: 11.7 g/dL — ABNORMAL LOW (ref 12.0–15.0)
Immature Granulocytes: 0 %
Lymphocytes Relative: 16 %
Lymphs Abs: 1.5 10*3/uL (ref 0.7–4.0)
MCH: 25.5 pg — ABNORMAL LOW (ref 26.0–34.0)
MCHC: 30.4 g/dL (ref 30.0–36.0)
MCV: 83.9 fL (ref 80.0–100.0)
Monocytes Absolute: 0.7 10*3/uL (ref 0.1–1.0)
Monocytes Relative: 8 %
Neutro Abs: 6.9 10*3/uL (ref 1.7–7.7)
Neutrophils Relative %: 73 %
Platelets: 202 10*3/uL (ref 150–400)
RBC: 4.59 MIL/uL (ref 3.87–5.11)
RDW: 15 % (ref 11.5–15.5)
WBC: 9.5 10*3/uL (ref 4.0–10.5)
nRBC: 0 % (ref 0.0–0.2)

## 2020-05-29 LAB — CREATININE, SERUM
Creatinine, Ser: 1.45 mg/dL — ABNORMAL HIGH (ref 0.44–1.00)
GFR calc Af Amer: 41 mL/min — ABNORMAL LOW (ref >60)
GFR calc non Af Amer: 35 mL/min — ABNORMAL LOW (ref >60)

## 2020-05-29 LAB — IRON AND TIBC
Iron: 69 ug/dL (ref 28–170)
Saturation Ratios: 19 % (ref 10.4–31.8)
TIBC: 360 ug/dL (ref 250–450)
UIBC: 291 ug/dL

## 2020-05-29 LAB — FERRITIN: Ferritin: 39 ng/mL (ref 11–307)

## 2020-06-04 ENCOUNTER — Telehealth: Payer: Self-pay | Admitting: Nurse Practitioner

## 2020-06-05 ENCOUNTER — Telehealth: Payer: Self-pay | Admitting: Nurse Practitioner

## 2020-06-06 ENCOUNTER — Telehealth: Payer: Self-pay | Admitting: General Practice

## 2020-06-06 ENCOUNTER — Telehealth: Payer: Self-pay

## 2020-06-06 NOTE — Telephone Encounter (Signed)
  Chronic Care Management   Outreach Note  06/06/2020 Name: Madison Franklin MRN: [id number] DOB: [birth year]  Referred by: Venita Lick, NP Reason for referral : Appointment (RNCM Follow up call for Chronic Disease Managment and Care Coordination needs. )   An unsuccessful telephone outreach was attempted today. The patient was referred to the case management team for assistance with care management and care coordination.   Follow Up Plan: A HIPPA compliant phone message was left for the patient providing contact information and requesting a return call.   Noreene Larsson RN, MSN, [facility] Mobile: [phone]

## 2020-06-07 ENCOUNTER — Other Ambulatory Visit: Payer: Self-pay

## 2020-06-07 ENCOUNTER — Other Ambulatory Visit: Payer: Self-pay | Admitting: Pharmacy Technician

## 2020-06-07 DIAGNOSIS — K828 Other specified diseases of gallbladder: Secondary | ICD-10-CM

## 2020-06-07 NOTE — Patient Outreach (Signed)
[facility]) Care Management  06/07/2020  Madison Franklin 06/16/1946 [id number]  Successful outreach call placed in regards to [facility] application for [place].  Spoke to patient's husband Elenore Rota, HIPAA confirmed and verified.  Donald informed they have received the gratis fill of 3 inhalers from the [facility] patient assistance company. He informs they have NOT received a determination letter from social security regarding low income subsidy extra help from Medicare. He informs he and his wife have a phone call with them this week. He informs he is aware that the  patient assistance company is in need of the letter to continue to send out the medication. Informed patient to outreach me when he receives the determination letter. He informs he has my number.  Will await a return call concerning LIS determination.  [name] P. Niaja Stickley, [place]  [phone]

## 2020-06-08 ENCOUNTER — Telehealth: Payer: Self-pay

## 2020-06-08 ENCOUNTER — Telehealth: Payer: Self-pay | Admitting: Nurse Practitioner

## 2020-06-08 NOTE — Telephone Encounter (Signed)
Patient is aware of date/time of covid test prior to PFT.  

## 2020-06-11 ENCOUNTER — Other Ambulatory Visit
Admission: RE | Admit: 2020-06-11 | Discharge: 2020-06-11 | Disposition: A | Discharge status: Home / Self Care | Payer: Medicare Other | Source: Ambulatory Visit | Attending: Pulmonary Disease | Admitting: Pulmonary Disease

## 2020-06-11 ENCOUNTER — Other Ambulatory Visit: Payer: Self-pay

## 2020-06-11 DIAGNOSIS — Z01812 Encounter for preprocedural laboratory examination: Secondary | ICD-10-CM | POA: Diagnosis not present

## 2020-06-11 DIAGNOSIS — Z20822 Contact with and (suspected) exposure to covid-19: Secondary | ICD-10-CM | POA: Insufficient documentation

## 2020-06-11 LAB — SARS CORONAVIRUS 2 (TAT 6-24 HRS): SARS Coronavirus 2: NEGATIVE

## 2020-06-12 ENCOUNTER — Ambulatory Visit
Admission: RE | Admit: 2020-06-12 | Discharge: 2020-06-12 | Disposition: A | Discharge status: Home / Self Care | Payer: Medicare Other | Source: Ambulatory Visit | Attending: Pulmonary Disease | Admitting: Pulmonary Disease

## 2020-06-12 ENCOUNTER — Ambulatory Visit (HOSPITAL_COMMUNITY): Payer: Medicare Other

## 2020-06-12 DIAGNOSIS — R011 Cardiac murmur, unspecified: Secondary | ICD-10-CM | POA: Diagnosis not present

## 2020-06-12 DIAGNOSIS — E785 Hyperlipidemia, unspecified: Secondary | ICD-10-CM | POA: Insufficient documentation

## 2020-06-12 DIAGNOSIS — R0602 Shortness of breath: Secondary | ICD-10-CM

## 2020-06-12 DIAGNOSIS — I10 Essential (primary) hypertension: Secondary | ICD-10-CM | POA: Insufficient documentation

## 2020-06-12 DIAGNOSIS — J449 Chronic obstructive pulmonary disease, unspecified: Secondary | ICD-10-CM | POA: Insufficient documentation

## 2020-06-12 LAB — ECHOCARDIOGRAM COMPLETE
AR max vel: 1.75 cm2
AV Area VTI: 1.97 cm2
AV Area mean vel: 1.6 cm2
AV Mean grad: 5.7 mmHg
AV Peak grad: 11.9 mmHg
Ao pk vel: 1.72 m/s
Area-P 1/2: 3.77 cm2
S' Lateral: 1.88 cm

## 2020-06-12 MED ORDER — ALBUTEROL SULFATE (2.5 MG/3ML) 0.083% IN NEBU
2.5000 mg | INHALATION_SOLUTION | Freq: Once | RESPIRATORY_TRACT | Status: AC
  Administered 2020-06-12: 2.5 mg via RESPIRATORY_TRACT
  Filled 2020-06-12: qty 3

## 2020-06-12 NOTE — Progress Notes (Signed)
*  PRELIMINARY RESULTS* Echocardiogram 2D Echocardiogram has been performed.  Madison Franklin 06/12/2020, 10:37 AM

## 2020-06-13 LAB — PULMONARY FUNCTION TEST ARMC ONLY
DL/VA % pred: 55 %
DL/VA: 2.35 ml/min/mmHg/L
DLCO unc % pred: 29 %
DLCO unc: 5.02 ml/min/mmHg
FEF 25-75 Post: 0.62 L/sec
FEF 25-75 Pre: 0.48 L/sec
FEF2575-%Change-Post: 28 %
FEF2575-%Pred-Post: 47 %
FEF2575-%Pred-Pre: 36 %
FEV1-%Change-Post: 6 %
FEV1-%Pred-Post: 51 %
FEV1-%Pred-Pre: 47 %
FEV1-Post: 0.74 L
FEV1-Pre: 0.69 L
FEV1FVC-%Change-Post: 0 %
FEV1FVC-%Pred-Pre: 96 %
FEV6-%Change-Post: 6 %
FEV6-%Pred-Post: 55 %
FEV6-%Pred-Pre: 52 %
FEV6-Post: 0.99 L
FEV6-Pre: 0.93 L
FEV6FVC-%Change-Post: 0 %
FEV6FVC-%Pred-Post: 104 %
FEV6FVC-%Pred-Pre: 105 %
FVC-%Change-Post: 6 %
FVC-%Pred-Post: 53 %
FVC-%Pred-Pre: 49 %
FVC-Post: 1 L
FVC-Pre: 0.93 L
Post FEV1/FVC ratio: 74 %
Post FEV6/FVC ratio: 100 %
Pre FEV1/FVC ratio: 74 %
Pre FEV6/FVC Ratio: 100 %
RV % pred: 71 %
RV: 1.53 L
TLC % pred: 64 %
TLC: 2.97 L

## 2020-06-13 NOTE — Telephone Encounter (Signed)
Pt has been r/s for 06/29/2020

## 2020-06-28 ENCOUNTER — Other Ambulatory Visit: Payer: Self-pay

## 2020-06-28 ENCOUNTER — Ambulatory Visit
Admission: RE | Admit: 2020-06-28 | Discharge: 2020-06-28 | Disposition: A | Discharge status: Home / Self Care | Payer: Medicare Other | Source: Ambulatory Visit | Attending: Surgery | Admitting: Surgery

## 2020-06-28 DIAGNOSIS — K828 Other specified diseases of gallbladder: Secondary | ICD-10-CM | POA: Insufficient documentation

## 2020-06-28 DIAGNOSIS — K8020 Calculus of gallbladder without cholecystitis without obstruction: Secondary | ICD-10-CM | POA: Diagnosis not present

## 2020-06-29 ENCOUNTER — Other Ambulatory Visit: Payer: Medicare Other

## 2020-06-29 ENCOUNTER — Telehealth: Payer: Medicare Other

## 2020-06-29 ENCOUNTER — Other Ambulatory Visit: Payer: Self-pay | Admitting: Pharmacy Technician

## 2020-06-29 ENCOUNTER — Telehealth: Payer: Self-pay | Admitting: General Practice

## 2020-06-29 NOTE — Telephone Encounter (Signed)
°  Chronic Care Management   Outreach Note  06/29/2020 Name: Madison Franklin MRN: [id number] DOB: Jun 25, 1946  Referred by: Venita Lick, NP Reason for referral : Chronic Care Management (RNCM Follow up call for Chronic Disease Management and Care Coordination Needs)   An unsuccessful telephone outreach was attempted today. The patient was referred to the case management team for assistance with care management and care coordination.   Follow Up Plan: A HIPPA compliant phone message was left for the patient providing contact information and requesting a return call.   Noreene Larsson RN, MSN, [facility] Mobile: [phone]

## 2020-06-29 NOTE — Patient Outreach (Signed)
[facility]) Care Management  06/29/2020  AILYN GLADD 1946-07-27 [id number]  Care coordination call placed to BI in regards to [facility] application.  Spoke to [place] who informed patient's enrollment had been extended until 10/19/2020 as they received the denial LIS letter that was sent in by the patient. She informed the patient's next refill can be ordered by the patient at the end of October by calling [phone].  Will close case as patient assistance is completed and patient has received the medication.  Khloee Garza P. Malie Kashani, [place]  [phone]

## 2020-07-02 ENCOUNTER — Telehealth: Payer: Self-pay | Admitting: Pulmonary Disease

## 2020-07-02 ENCOUNTER — Ambulatory Visit: Payer: Self-pay | Admitting: General Practice

## 2020-07-02 ENCOUNTER — Encounter: Payer: Self-pay | Admitting: Pulmonary Disease

## 2020-07-02 DIAGNOSIS — I2721 Secondary pulmonary arterial hypertension: Secondary | ICD-10-CM

## 2020-07-02 DIAGNOSIS — I2584 Coronary atherosclerosis due to calcified coronary lesion: Secondary | ICD-10-CM

## 2020-07-02 DIAGNOSIS — J449 Chronic obstructive pulmonary disease, unspecified: Secondary | ICD-10-CM

## 2020-07-02 NOTE — Telephone Encounter (Signed)
ATC, had to leave a vm x2.

## 2020-07-02 NOTE — Patient Instructions (Signed)
Visit Information  Goals Addressed   None       The care management team will reach out to the patient again over the next 30 to 60 days.   Noreene Larsson RN, MSN, [facility] Mobile: [phone]

## 2020-07-02 NOTE — Telephone Encounter (Signed)
Spoke with Dr. Patsey Berthold regarding hold slots this week for patient to be seen.  Advised she could be seen in one of the slots.  Scheduled her for 9am, patient to have cxr prior to visit.  Called and spoke with patient's daughter, Madison Franklin, listed on DPR, advised of need for CXR and times for her mom to be seen.  Patient to come at 9am slot after cxr complete.   Daughter verbalized understanding.  Nothing further needed.

## 2020-07-02 NOTE — Telephone Encounter (Signed)
Madison Franklin is returning phone call. Would like for [place] to call [name] sister phone number is [phone].

## 2020-07-02 NOTE — Telephone Encounter (Signed)
ATC, LVM for daugher, Ross Marcus listed on DPR   Primary Pulmonologist:  Madison Franklin  Last office visit and with whom: Madison Franklin  05/28/2020 What do we see them for (pulmonary problems): sob, snoring, apnea Last OV assessment/plan:   Was appointment offered to patient (explain)?  All Notes  Patient Instructions by Tyler Pita, MD at 05/28/2020 8:30 AM Author: Tyler Pita, MD Author Type: Physician Filed: 05/28/2020 9:43 AM  Note Status: Signed Cosign: Cosign Not Required Encounter Date: 05/28/2020  Editor: Tyler Pita, MD (Physician)                What we discussed today:  We are going to get breathing tests  We are going to get a sleep study  We are also checking your heart echocardiogram  We are also going to get a chest x-ray  Continue taking the Stiolto 2 sprays once a day  Continue using albuterol (dark blue inhaler) as needed  We will see you in follow-up in 4 to 6 weeks time call sooner should any new problems arise    Instructions  What we discussed today:  We are going to get breathing tests  We are going to get a sleep study  We are also checking your heart echocardiogram  We are also going to get a chest x-ray  Continue taking the Stiolto 2 sprays once a day  Continue using albuterol (dark blue inhaler) as needed  We will see you in follow-up in 4 to 6 weeks time call sooner should any new problems arise         Reason for call: sob.    (examples of things to ask: : When did symptoms start? Fever? Cough? Productive? Color to sputum? More sputum than usual? Wheezing? Have you needed increased oxygen? Are you taking your respiratory medications? What over the counter measures have you tried?)  Allergies  Allergen Reactions  . Latex Itching and Rash    With latex gloves (when worn)    Immunization History  Administered Date(s) Administered  . Influenza, High Dose Seasonal PF 09/03/2016  . Influenza,inj,Quad PF,6+ Mos  07/27/2015  . Influenza,inj,quad, With Preservative 08/24/2018, 07/15/2019  . Influenza-Unspecified 08/14/2017, 08/24/2018  . PFIZER SARS-COV-2 Vaccination 12/15/2019, 01/11/2020  . Pneumococcal Conjugate-13 01/12/2015  . Pneumococcal Polysaccharide-23 07/01/2011  . Td 07/27/2015

## 2020-07-02 NOTE — Telephone Encounter (Signed)
Madison Franklin daughter is returning phone call. Madison Franklin phone number is [phone].

## 2020-07-02 NOTE — Chronic Care Management (AMB) (Signed)
°  Chronic Care Management   Note  07/02/2020 Name: Madison Franklin MRN: [id number] DOB: Mar 09, 1946  Message from Marnee Guarneri, NP asking for assistance with the message received this morning on behalf of the patient from the patients daughter, Carolyne Fiscal. The patient had an incident over the weekend with shortness of breath and needing assistance. The daughter was inquiring about oxygen needs.   A call was made to [facility]. Spoke with Maudie Mercury. Detailed information provided to Maudie Mercury from my chart message and Maudie Mercury will have someone call the patients daughter, Carolyne Fiscal for follow up. Will continue to monitor.   Follow up plan: The care management team will reach out to the patient again over the next 30 to 60 days.   Noreene Larsson RN, MSN, [facility] Mobile: [phone]

## 2020-07-02 NOTE — Telephone Encounter (Signed)
Spoke with patient's daughter, Cathlean Sauer, listed on the [facility].  The patient has been sob for some time, but over the weekend, she got really sob walking 20 step, was unable to speak or walk and turned pale.  Her daughter states her oxygen levels have been below 88%, advised to keep a record until we can have her seen in the office.  She denies any cough, fever, sick contacts.  She has had both of her covid vaccines.  She has been using her husband's oxygen because her sats have been worse than his.  Unable to find a slot on the schedule for her to be seen in the next 2 weeks.  I let the daughter know I would check with Dr. Luvenia Redden and then we would get back in touch with her about getting her seen.  She verbalized understanding.  Dr. Luvenia Redden, Please advise if it would be ok to put her in one of your hold slots so we can get her into the office for an evaluation and see if she qualifies for oxygen.  Thank you.

## 2020-07-03 NOTE — Telephone Encounter (Signed)
See previous phone encounter   Spoke with Dr. Patsey Berthold regarding hold slots this week for patient to be seen.  Advised she could be seen in one of the slots.  Scheduled her for 9am, patient to have cxr prior to visit.  Called and spoke with patient's daughter, Cathlean Sauer, listed on DPR, advised of need for CXR and times for her mom to be seen.  Patient to come at 9am slot after cxr complete.   Daughter verbalized understanding.  Nothing further needed.

## 2020-07-04 ENCOUNTER — Encounter: Payer: Self-pay | Admitting: Pulmonary Disease

## 2020-07-04 ENCOUNTER — Other Ambulatory Visit: Payer: Self-pay

## 2020-07-04 ENCOUNTER — Ambulatory Visit
Admission: RE | Admit: 2020-07-04 | Discharge: 2020-07-04 | Disposition: A | Discharge status: Home / Self Care | Payer: Medicare Other | Source: Ambulatory Visit | Attending: Pulmonary Disease | Admitting: Pulmonary Disease

## 2020-07-04 ENCOUNTER — Ambulatory Visit (INDEPENDENT_AMBULATORY_CARE_PROVIDER_SITE_OTHER): Payer: Medicare Other | Admitting: Pulmonary Disease

## 2020-07-04 VITALS — BP 124/70 | HR 82 | Temp 97.1°F | Ht 61.0 in | Wt 151.8 lb

## 2020-07-04 DIAGNOSIS — I272 Pulmonary hypertension, unspecified: Secondary | ICD-10-CM | POA: Diagnosis not present

## 2020-07-04 DIAGNOSIS — R0602 Shortness of breath: Secondary | ICD-10-CM | POA: Diagnosis not present

## 2020-07-04 DIAGNOSIS — J9611 Chronic respiratory failure with hypoxia: Secondary | ICD-10-CM

## 2020-07-04 DIAGNOSIS — J449 Chronic obstructive pulmonary disease, unspecified: Secondary | ICD-10-CM

## 2020-07-04 DIAGNOSIS — R942 Abnormal results of pulmonary function studies: Secondary | ICD-10-CM

## 2020-07-04 DIAGNOSIS — J90 Pleural effusion, not elsewhere classified: Secondary | ICD-10-CM | POA: Diagnosis not present

## 2020-07-04 NOTE — Progress Notes (Signed)
Subjective:    Patient ID: Madison Franklin, female    DOB: November 02, 1945, 74 y.o.   MRN: [id number]   Requesting MD/Service:Cheryl Julian Hy, NP Date of initial consultation:05/11/17 by Dr. Merton Border Reason for consultation:Former smoker, exertional dyspnea  PT PROFILE: 74 y.o.femaleformer smoker(approx 15 P-Y history)referred for evaluation of exertional dyspnea  PROBLEMS: Former smoker Severe thoracic kyphoscoliosis Moderate to severe restrictive pulmonary physiology Moderate pulmonary hypertension   DATA: CT chest 03/13/17: Severe thoracic kyphoscoliosis. 4 mm nodule in left upper lobe (decreased in size from previously). Minimal emphysema Echocardiogram 05/28/17:LVEF 60-65%. Grade 1 diastolic dysfunction. No significant valvular disease noted. No evidence of right-sided overload. RHC 07/14/17:Mild to moderate pulmonary hypertension (mean PAP 34 mmHg, PVR 5.7 Wood units) PFTs 10/01/17:FVC: 1.28 L (49 %pred), FEV1: 0.83 >0.91 L (41 >45 %pred), FEV1/FVC: 65%; lung volume measurements invalid, DLCO 6.6 (31 %pred) ONO 10/26/17: Minimal brief desaturation. Does not require nocturnal O2 LDCT chest 06/01/18:Lung-RADS 2, benign appearance or behavior. Aortic atherosclerosis, coronary artery atherosclerosis and emphysema. Pulmonary artery enlargement suggests pulmonary arterial hypertension. Gallbladder wall calcifications indicative of "porcelain gallbladder". Very large thyroid gland extending into upper mediastinum. 2D echo 06/12/2020: LVEF 60 to 65%.  Grade 1 diastolic dysfunction.  Elevated left atrial pressure.  Increased size of right ventricle.  Severely elevated pulmonary artery systolic pressure.  Estimated right ventricular systolic pressure 00.9 mmHg. PFTs 06/12/2020: Severe restriction, element of obstruction also evident by flow volume loop.  Diffusion capacity impaired moderately.   INTERVAL: Last seen  05/28/2020 by me. Increasing dyspnea and low O2 sats  noted at home.  Patient has not followed up at scheduled.  Had 2D echo and PFTs 06/12/2020 as above.  HPI  The patient is a 74 year old former smoker, who presents for follow-up from 28 May 2020.  Call that the patient previously followed with Dr. Merton Border.  She has been erratic with follow-up.  She was instructed to get a chest x-ray, PFTs and 2D echo on her initial visit with me on 9 August.  She never had her chest x-ray done.  She did have PFTs and 2D echo was performed as above.  She was also supposed to follow-up after her PFTs and echo were done.  She presents today due to increasing dyspnea.  Recall that during her 9 August visit she failed to qualify for home oxygen.  Apparently she had qualified for home oxygen previously in 2019 however, she declined the oxygen.  Family has noted that she has had some desaturations at home.  The patient has severe kyphoscoliosis and pulmonary hypertension.  She is to have a split-night study.  She has not had any fevers, chills or sweats.  No cough or sputum production.  No chest pain.  No lower extremity edema.  She is unsure but she felt that she would "pass out" after walking coming out of church on Sunday.  We have reviewed her PFTs and 2D echo as noted above.  She has been compliant with Stiolto and as needed albuterol.  She does note that Stiolto offers her some relief.   Review of Systems A 10 point review of systems was performed and it is as noted above otherwise negative.  Allergies  Allergen Reactions  . Latex Itching and Rash    With latex gloves (when worn)   Current Meds  Medication Sig  . acetaminophen (TYLENOL) 325 MG tablet Take 325 mg by mouth every 6 (six) hours as needed (for pain.).  Marland Kitchen albuterol (VENTOLIN HFA) 108 (90  Base) MCG/ACT inhaler Inhale 2 puffs into the lungs every 6 (six) hours as needed for wheezing or shortness of breath.  Marland Kitchen amLODipine (NORVASC) 5 MG tablet Take 1 tablet (5 mg total) by mouth daily.  Marland Kitchen aspirin  EC 81 MG tablet Take 81 mg by mouth daily.  Marland Kitchen atorvastatin (LIPITOR) 20 MG tablet Take 1 tablet (20 mg total) by mouth daily.  . hydrochlorothiazide (MICROZIDE) 12.5 MG capsule Take 1 capsule (12.5 mg total) by mouth daily.  Marland Kitchen lisinopril (ZESTRIL) 40 MG tablet Take 1 tablet (40 mg total) by mouth daily.  . Tiotropium Bromide-Olodaterol 2.5-2.5 MCG/ACT AERS Inhale 2 puffs into the lungs daily.  . vitamin B-12 (CYANOCOBALAMIN) 1000 MCG tablet Take 1,000 mcg by mouth daily.  . Vitamin D, Cholecalciferol, 1000 units TABS Take 1 tablet by mouth daily.   Immunization History  Administered Date(s) Administered  . Influenza, High Dose Seasonal PF 09/03/2016  . Influenza,inj,Quad PF,6+ Mos 07/27/2015  . Influenza,inj,quad, With Preservative 08/24/2018, 07/15/2019  . Influenza-Unspecified 08/14/2017, 08/24/2018  . PFIZER SARS-COV-2 Vaccination 12/15/2019, 01/11/2020  . Pneumococcal Conjugate-13 01/12/2015  . Pneumococcal Polysaccharide-23 07/01/2011  . Td 07/27/2015         Objective:   Physical Exam BP 124/70 (BP Location: Left Arm, Cuff Size: Normal)   Pulse 82   Temp (!) 97.1 F (36.2 C) (Temporal)   Ht 5\' 1"  (1.549 m)   Wt 151 lb 12.8 oz (68.9 kg)   LMP  (LMP Unknown)   SpO2 98%   BMI 28.68 kg/m  GENERAL: Awake alert, no distress. HEAD: Normocephalic, atraumatic.  Wears hearing aids. EYES: Pupils equal, round, reactive to light.  No scleral icterus.  MOUTH: Nose/mouth/throat not examined due to masking requirements for COVID 19. NECK: Supple.  Thyroid diffusely enlarged.  Deviated to the right. No JVD.  No adenopathy. PULMONARY: Distant breath sounds, coarse, no other adventitious sounds.Marland Kitchen CARDIOVASCULAR: S1 and S2. Regular rate and rhythm.  Loud holosystolic murmur grade 4/6 best heard at the left sternal border  ABDOMEN: Soft, nondistended, nontender. MUSCULOSKELETAL: Very severe kyphoscoliosis. NEUROLOGIC: No focal deficits. SKIN: Intact,warm,dry.  No overt rashes  noted. PSYCH: Flat affect, normal behavior  Ambulatory oximetry performed today: Shows that on one single lap (less than 250 feet pulse (she desaturated to 87% and had increasing shortness of breath.  With oxygen at 2 L/min she was able to maintain oxygen saturations of 94%.  She remains significantly fatigued but dyspnea was improved.  Chest x-ray performed today: Shows severe levoscoliosis with mostly thoracic spine involvement.  Lung fields are otherwise clear no change when compared to prior.     Assessment & Plan:     ICD-10-CM   1. severe restrictive pulmonary physiology due to severe kyphoscoliosis  R94.2    This is the main component of the patient's dyspnea She has severe thoracic levoscoliosis Large thyroid goiter All contribute to dyspnea  2. Pulmonary hypertension, moderate to severe (HCC)  I27.20    Pulmonary arterial hypertension, severe This issue adds to her sensation of dyspnea Will place her on supplemental oxygen  3. Chronic respiratory failure with hypoxia (HCC)  J96.11    Patient shows evidence of desaturation with ambulation Placed on 2 L/min with activity and sleep She has severe pulmonary hypertension and will need O2   4. Chronic obstructive pulmonary disease, unspecified COPD type ([place])  J44.9 AMB REFERRAL FOR DME    CANCELED: AMB REFERRAL FOR DME   Element of chronic obstructive pulmonary disease This is actually the lesser  of her issues Continue Stiolto   Orders Placed This Encounter  Procedures  . AMB REFERRAL FOR DME    Referral Priority:   Routine    Referral Type:   Durable Medical Equipment Purchase    Number of Visits Requested:   1   Discussion: The patient has severe restrictive physiology due to severe thoracic kyphoscoliosis.  In addition she has a thyroid goiter that is mostly mediastinal.  Flow volume loop does not show plateaus seen with extrathoracic obstruction.  Patient does have severe pulmonary hypertension likely due to chronic  hypoxic vasoconstriction.  Will start supplementation with oxygen at 2 L/min with exertion and during sleep.  Patient is to follow-up in to contact us prior to that time should any new difficulties arise.   Renold Don, MD [facility]   *This note was dictated using voice recognition software/Dragon.  Despite best efforts to proofread, errors can occur which can change the meaning.  Any change was purely unintentional.

## 2020-07-04 NOTE — Patient Instructions (Signed)
You will need oxygen at 2 L/min.  This will be 24/7.  Please get the x-ray that was ordered previously done.   We will see you in follow-up in 2 to 3 months time call sooner should any new problems arise.

## 2020-07-05 ENCOUNTER — Telehealth: Payer: Self-pay | Admitting: Pulmonary Disease

## 2020-07-05 NOTE — Addendum Note (Signed)
Addended by: Claudette Head A on: 07/05/2020 09:57 AM   Modules accepted: Orders

## 2020-07-05 NOTE — Telephone Encounter (Signed)
Order has been corrected.  Gilda with [employer] is aware.  Nothing further is needed at this time.

## 2020-07-06 DIAGNOSIS — J449 Chronic obstructive pulmonary disease, unspecified: Secondary | ICD-10-CM | POA: Diagnosis not present

## 2020-07-06 NOTE — Telephone Encounter (Signed)
Pt has been r/s  

## 2020-07-09 ENCOUNTER — Other Ambulatory Visit: Payer: Self-pay

## 2020-07-09 ENCOUNTER — Ambulatory Visit (INDEPENDENT_AMBULATORY_CARE_PROVIDER_SITE_OTHER): Payer: Medicare Other | Admitting: Surgery

## 2020-07-09 ENCOUNTER — Encounter: Payer: Self-pay | Admitting: Surgery

## 2020-07-09 VITALS — BP 166/70 | HR 96 | Temp 98.8°F | Ht 61.0 in | Wt 151.4 lb

## 2020-07-09 DIAGNOSIS — K828 Other specified diseases of gallbladder: Secondary | ICD-10-CM | POA: Diagnosis not present

## 2020-07-09 NOTE — Patient Instructions (Addendum)
Dr.Pabon suggested patient may repeat an ultrasound in one year and follow up with an office visit.   Cholelithiasis  Cholelithiasis is also called "gallstones." It is a kind of gallbladder disease. The gallbladder is an organ that stores a liquid (bile) that helps you digest fat. Gallstones may not cause symptoms (may be silent gallstones) until they cause a blockage, and then they can cause pain (gallbladder attack). Follow these instructions at home:  Take over-the-counter and prescription medicines only as told by your doctor.  Stay at a healthy weight.  Eat healthy foods. This includes: ? Eating fewer fatty foods, like fried foods. ? Eating fewer refined carbs (refined carbohydrates). Refined carbs are breads and grains that are highly processed, like white bread and white rice. Instead, choose whole grains like whole-wheat bread and brown rice. ? Eating more fiber. Almonds, fresh fruit, and beans are healthy sources of fiber.  Keep all follow-up visits as told by your doctor. This is important. Contact a doctor if:  You have sudden pain in the upper right side of your belly (abdomen). Pain might spread to your right shoulder or your chest. This may be a sign of a gallbladder attack.  You feel sick to your stomach (are nauseous).  You throw up (vomit).  You have been diagnosed with gallstones that have no symptoms and you get: ? Belly pain. ? Discomfort, burning, or fullness in the upper part of your belly (indigestion). Get help right away if:  You have sudden pain in the upper right side of your belly, and it lasts for more than 2 hours.  You have belly pain that lasts for more than 5 hours.  You have a fever or chills.  You keep feeling sick to your stomach or you keep throwing up.  Your skin or the whites of your eyes turn yellow (jaundice).  You have dark-colored pee (urine).  You have light-colored poop (stool). Summary  Cholelithiasis is also called  "gallstones."  The gallbladder is an organ that stores a liquid (bile) that helps you digest fat.  Silent gallstones are gallstones that do not cause symptoms.  A gallbladder attack may cause sudden pain in the upper right side of your belly. Pain might spread to your right shoulder or your chest. If this happens, contact your doctor.  If you have sudden pain in the upper right side of your belly that lasts for more than 2 hours, get help right away. This information is not intended to replace advice given to you by your health care provider. Make sure you discuss any questions you have with your health care provider. Document Revised: 09/18/2017 Document Reviewed: 06/22/2016 Elsevier Patient Education  2020 [employer].

## 2020-07-11 NOTE — Progress Notes (Signed)
74 year old female well-known to me with a history of coarsening of gallbladder.  More importantly she does have chronic pulmonary disease and is on home oxygen.  No recent was evaluated by Dr. Patsey Berthold from pulmonary medicine and is currently on inhalers.  Possible pulmonary issues are from a restrictive perspective due to severe kyphosis.  He does have limited pulmonary performance.  I did obtain an ultrasound that have personally reviewed showing evidence of gallstones and a porcelain gallbladder.  There is no evidence of significant masses and there is no change when compared to prior exam.   PE NAD Abd: Soft nontender no peritonitis no masses.  A/P Porcelain gallbladder on a debilitated 74 year old female with significant pulmonary issues.  Discussed with patient detail about her disease process.  Extensive discussion with her and her family member.  They are very worried about any surgical intervention given her significant fragility and pulmonary issues.  I do agree that she will be high risk for surgical intervention.  Options offered to her including potential cholecystectomy versus no other yearly exam with an ultrasound.  Patient wishes to come back in a year with a repeat ultrasound.  No need for surgical intervention Time spent in this encounter was 25 minutes with greater than 50% spent coordination counseling of her care

## 2020-07-16 ENCOUNTER — Ambulatory Visit: Payer: Medicare Other | Attending: Pulmonary Disease

## 2020-07-16 DIAGNOSIS — G4733 Obstructive sleep apnea (adult) (pediatric): Secondary | ICD-10-CM | POA: Insufficient documentation

## 2020-07-16 DIAGNOSIS — R0683 Snoring: Secondary | ICD-10-CM | POA: Diagnosis present

## 2020-07-17 ENCOUNTER — Other Ambulatory Visit: Payer: Self-pay

## 2020-07-27 ENCOUNTER — Telehealth (HOSPITAL_BASED_OUTPATIENT_CLINIC_OR_DEPARTMENT_OTHER): Payer: Medicare Other | Admitting: Pulmonary Disease

## 2020-07-27 DIAGNOSIS — G4733 Obstructive sleep apnea (adult) (pediatric): Secondary | ICD-10-CM

## 2020-07-27 DIAGNOSIS — J9611 Chronic respiratory failure with hypoxia: Secondary | ICD-10-CM

## 2020-07-27 NOTE — Telephone Encounter (Signed)
CPAP +7 cm with small full face mask used during split study for mild OSA Oxygen not required. No significant hypoventilation noted to require bipap Please order as clinically indicated

## 2020-07-29 ENCOUNTER — Encounter: Payer: Self-pay | Admitting: Nurse Practitioner

## 2020-07-29 DIAGNOSIS — J9611 Chronic respiratory failure with hypoxia: Secondary | ICD-10-CM | POA: Insufficient documentation

## 2020-07-29 DIAGNOSIS — I700 Atherosclerosis of aorta: Secondary | ICD-10-CM | POA: Insufficient documentation

## 2020-07-30 NOTE — Telephone Encounter (Signed)
Lm for patient.  

## 2020-07-30 NOTE — Telephone Encounter (Signed)
CPAP at 7 cm H2O, heated humidity mask of choice.  No oxygen needed.  Obstructive sleep apnea, mild.

## 2020-07-31 NOTE — Telephone Encounter (Signed)
Patient is aware of results and voiced her understanding.  Patient wished to proceed with cpap. Order has been placed. Ov scheduled for 09/17/2020 at 11:30. Nothing further needed.

## 2020-07-31 NOTE — Addendum Note (Signed)
Addended by: Claudette Head A on: 07/31/2020 11:02 AM   Modules accepted: Orders

## 2020-07-31 NOTE — Addendum Note (Signed)
Addended by: Claudette Head A on: 07/31/2020 08:43 AM   Modules accepted: Orders

## 2020-07-31 NOTE — Telephone Encounter (Signed)
Order placed to [facility] to d/c nighttime oxygen. Okay per Dr. Patsey Berthold verbally.

## 2020-08-03 ENCOUNTER — Ambulatory Visit (INDEPENDENT_AMBULATORY_CARE_PROVIDER_SITE_OTHER): Payer: Medicare Other | Admitting: Nurse Practitioner

## 2020-08-03 ENCOUNTER — Encounter: Payer: Self-pay | Admitting: Nurse Practitioner

## 2020-08-03 ENCOUNTER — Other Ambulatory Visit: Payer: Self-pay

## 2020-08-03 VITALS — BP 126/68 | HR 85 | Temp 98.2°F | Wt 151.0 lb

## 2020-08-03 DIAGNOSIS — I700 Atherosclerosis of aorta: Secondary | ICD-10-CM | POA: Diagnosis not present

## 2020-08-03 DIAGNOSIS — K828 Other specified diseases of gallbladder: Secondary | ICD-10-CM

## 2020-08-03 DIAGNOSIS — I2721 Secondary pulmonary arterial hypertension: Secondary | ICD-10-CM

## 2020-08-03 DIAGNOSIS — D631 Anemia in chronic kidney disease: Secondary | ICD-10-CM

## 2020-08-03 DIAGNOSIS — J9611 Chronic respiratory failure with hypoxia: Secondary | ICD-10-CM | POA: Diagnosis not present

## 2020-08-03 DIAGNOSIS — R911 Solitary pulmonary nodule: Secondary | ICD-10-CM

## 2020-08-03 DIAGNOSIS — J449 Chronic obstructive pulmonary disease, unspecified: Secondary | ICD-10-CM

## 2020-08-03 DIAGNOSIS — Z23 Encounter for immunization: Secondary | ICD-10-CM | POA: Diagnosis not present

## 2020-08-03 DIAGNOSIS — N183 Chronic kidney disease, stage 3 unspecified: Secondary | ICD-10-CM

## 2020-08-03 DIAGNOSIS — N1831 Chronic kidney disease, stage 3a: Secondary | ICD-10-CM

## 2020-08-03 DIAGNOSIS — E782 Mixed hyperlipidemia: Secondary | ICD-10-CM

## 2020-08-03 DIAGNOSIS — I10 Essential (primary) hypertension: Secondary | ICD-10-CM

## 2020-08-03 NOTE — Assessment & Plan Note (Signed)
Chronic, ongoing.  Continue current medication regimen and adjust as needed.  Lipid panel repeat next visit.

## 2020-08-03 NOTE — Assessment & Plan Note (Signed)
Continue collaboration with hematology, recent note reviewed.

## 2020-08-03 NOTE — Assessment & Plan Note (Signed)
Chronic, ongoing.  Continue collaboration with pulmonary.  Continue current inhaler regimen and adjust as needed.

## 2020-08-03 NOTE — Assessment & Plan Note (Signed)
Recommend to her that she schedule her follow-up annual lung CT, of note they have been trying to contact her.

## 2020-08-03 NOTE — Assessment & Plan Note (Signed)
Followed by Dr. Dahlia Byes, continue this collaboration.

## 2020-08-03 NOTE — Assessment & Plan Note (Signed)
Chronic, ongoing.  Will continue Lisinopril for kidney protection, consider change to Losartan next visit due to underlying COPD.  BMP next visit.  Is establishing with nephrology on 08/16/20.

## 2020-08-03 NOTE — Progress Notes (Signed)
BP 126/68   Pulse 85   Temp 98.2 F (36.8 C) (Oral)   Wt 151 lb (68.5 kg)   LMP  (LMP Unknown)   SpO2 98%   BMI 28.53 kg/m    Subjective:    Patient ID: Madison Franklin, female    DOB: 10/15/1946, 74 y.o.   MRN: [id number]  HPI: Madison Franklin is a 74 y.o. female  Chief Complaint  Patient presents with  . Hypertension  . Hyperlipidemia  . COPD   Husband present at bedside to assist with HPI.  HYPERTENSION / HYPERLIPIDEMIA Continues on Lisinopril, Amlodipine, and HCTZ + Atorvastatin. Satisfied with current treatment? yes Duration of hypertension: chronic BP monitoring frequency: daily BP range: 130/60-70 range BP medication side effects: no Duration of hyperlipidemia: chronic Cholesterol medication side effects: no Cholesterol supplements: none Medication compliance: good compliance Aspirin: yes Recent stressors: no Recurrent headaches: no Visual changes: no Palpitations: no Dyspnea: improving Chest pain: no Lower extremity edema: no Dizzy/lightheaded: no   COPD Quit smoking over 4 years ago.  Has restarted with pulmonary and last saw 07/04/20, Dr. Patsey Berthold.  Due to her severe pulmonary hypertension they are starting O2 supplementation 2L [place] when exerting and at bedtime.  Has not gone for yearly lung CT and they have been trying to contact her to follow-up, has lung nodules to monitor -- last 2019.  She reports the oxygen is helping a whole lot.    Currently inhaler regimen: Stiolto and Albuterol. COPD status: stable Satisfied with current treatment?: yes Oxygen use: yes Dyspnea frequency: improving Cough frequency: occasional Rescue inhaler frequency:  once a day Limitation of activity: at times Productive cough: none Last Spirometry: with pulmonary Pneumovax: Up to Date Influenza: Up to Date   CHRONIC KIDNEY DISEASE Last February labs showed GFR 41 and CRT 1.43.  Has remained in this range for over a year.  Sees nephrology on 08/16/20. CKD status:  stable Medications renally dose: yes Previous renal evaluation: no Pneumovax:  Up to Date Influenza Vaccine:  Up to Date   ANEMIA Followed by Dr. Mike Gip and last seen in August 2021, with H/H 11.7/38.5, MCV 83.9, Ferritin 39 -- continues on oral B12 and .   Anemia status: stable Etiology of anemia: Duration of anemia treatment:  Compliance with treatment: good compliance Iron supplementation side effects: no Severity of anemia: mild Fatigue: no Decreased exercise tolerance: at times  Dyspnea on exertion: no Palpitations: no Bleeding: no Pica: no  Relevant past medical, surgical, family and social history reviewed and updated as indicated. Interim medical history since our last visit reviewed. Allergies and medications reviewed and updated.  Review of Systems  Constitutional: Negative for activity change, appetite change, diaphoresis, fatigue and fever.  Respiratory: Positive for shortness of breath (improving) and wheezing (improving). Negative for cough and chest tightness.   Cardiovascular: Negative for chest pain, palpitations and leg swelling.  Gastrointestinal: Negative.   Neurological: Negative.   Psychiatric/Behavioral: Negative.     Per HPI unless specifically indicated above     Objective:    BP 126/68   Pulse 85   Temp 98.2 F (36.8 C) (Oral)   Wt 151 lb (68.5 kg)   LMP  (LMP Unknown)   SpO2 98%   BMI 28.53 kg/m   Wt Readings from Last 3 Encounters:  08/03/20 151 lb (68.5 kg)  07/09/20 151 lb 6.4 oz (68.7 kg)  07/04/20 151 lb 12.8 oz (68.9 kg)    Physical Exam Vitals and nursing note reviewed.  Constitutional:      General: She is awake. She is not in acute distress.    Appearance: She is well-developed, well-groomed and overweight. She is not ill-appearing.  HENT:     Head: Normocephalic.     Right Ear: Hearing normal.     Left Ear: Hearing normal.  Eyes:     General: Lids are normal.        Right eye: No discharge.        Left eye: No  discharge.     Conjunctiva/sclera: Conjunctivae normal.     Pupils: Pupils are equal, round, and reactive to light.  Neck:     Thyroid: No thyromegaly.     Vascular: No carotid bruit.  Cardiovascular:     Rate and Rhythm: Normal rate and regular rhythm.     Heart sounds: Murmur heard.  Systolic murmur is present with a grade of 3/6.  No gallop.   Pulmonary:     Effort: Pulmonary effort is normal.     Breath sounds: Decreased breath sounds present.     Comments: Clear throughout, but diminished breath sounds throughout.  No wheezing noted on exam today, no rhonchi and no cough present.  No SOB with talking noted. Abdominal:     General: Bowel sounds are normal.     Palpations: Abdomen is soft.  Musculoskeletal:     Cervical back: Normal range of motion and neck supple.     Right lower leg: No edema.     Left lower leg: No edema.  Lymphadenopathy:     Cervical: No cervical adenopathy.  Skin:    General: Skin is warm and dry.  Neurological:     Mental Status: She is alert and oriented to person, place, and time.  Psychiatric:        Attention and Perception: Attention normal.        Mood and Affect: Mood normal.        Speech: Speech normal.        Behavior: Behavior normal. Behavior is cooperative.        Thought Content: Thought content normal.     Results for orders placed or performed during the hospital encounter of 06/12/20  ECHOCARDIOGRAM COMPLETE  Result Value Ref Range   Ao pk vel 1.72 m/s   AV Area VTI 1.97 cm2   AR max vel 1.75 cm2   AV Mean grad 5.7 mmHg   AV Peak grad 11.9 mmHg   S' Lateral 1.88 cm   AV Area mean vel 1.60 cm2   Area-P 1/2 3.77 cm2      Assessment & Plan:   Problem List Items Addressed This Visit      Cardiovascular and Mediastinum   Essential hypertension    Chronic, ongoing with BP initially elevated today, but repeat at goal.  Continue current medication regimen and adjust as needed.  Will continue Lisinopril for kidney protection,  consider change to Losartan next visit due to underlying COPD.  Recommend continue to monitor BP at home daily and focus on DASH diet.  Will check BMP and TSH next visit, recent labs on file.  Return in 6 months.      Pulmonary arterial hypertension (HCC)    Chronic, ongoing.  Continue collaboration with pulmonary.  Continue current inhaler regimen and adjust as needed.      Aortic atherosclerosis ([place])    Noted on CT 07/25/2018.  Continue daily statin and ASA.  Continue cessation of smoking.  Respiratory   COPD, severe ([place]) - Primary    Chronic, ongoing.  Recently reestablished with pulmonary, continue this collaboration and current inhaler regimen.  Recommend she scheduled annual lung CT scan for follow-up and provided husband number to call.  Return in 6 months for follow-up, sooner if worsening symptoms.      Chronic respiratory failure with hypoxia (HCC)    Followed by pulmonary and started on O2 supplementation on 07/04/20, which she reports has been beneficial.        Digestive   Porcelain gallbladder    Followed by Dr. Dahlia Byes, continue this collaboration.          Genitourinary   Chronic kidney disease, stage 3 (HCC)    Chronic, ongoing.  Will continue Lisinopril for kidney protection, consider change to Losartan next visit due to underlying COPD.  BMP next visit.  Is establishing with nephrology on 08/16/20.        Other   Hyperlipidemia    Chronic, ongoing.  Continue current medication regimen and adjust as needed.  Lipid panel repeat next visit.      Nodule of upper lobe of left lung    Recommend to her that she schedule her follow-up annual lung CT, of note they have been trying to contact her.      Anemia in chronic kidney disease    Continue collaboration with hematology, recent note reviewed.       Other Visit Diagnoses    Need for influenza vaccination       Relevant Orders   Flu Vaccine QUAD High Dose(Fluad)       Follow up plan: Return  in about 6 months (around 02/01/2021) for HTN/HLD, COPD, Porcleain gallbladder, OSTEOPENIA.

## 2020-08-03 NOTE — Assessment & Plan Note (Signed)
Followed by pulmonary and started on O2 supplementation on 07/04/20, which she reports has been beneficial.

## 2020-08-03 NOTE — Patient Instructions (Signed)
Chronic Obstructive Pulmonary Disease Chronic obstructive pulmonary disease (COPD) is a long-term (chronic) lung problem. When you have COPD, it is hard for air to get in and out of your lungs. Usually the condition gets worse over time, and your lungs will never return to normal. There are things you can do to keep yourself as healthy as possible.  Your doctor may treat your condition with: ? Medicines. ? Oxygen. ? Lung surgery.  Your doctor may also recommend: ? Rehabilitation. This includes steps to make your body work better. It may involve a team of specialists. ? Quitting smoking, if you smoke. ? Exercise and changes to your diet. ? Comfort measures (palliative care). Follow these instructions at home: Medicines  Take over-the-counter and prescription medicines only as told by your doctor.  Talk to your doctor before taking any cough or allergy medicines. You may need to avoid medicines that cause your lungs to be dry. Lifestyle  If you smoke, stop. Smoking makes the problem worse. If you need help quitting, ask your doctor.  Avoid being around things that make your breathing worse. This may include smoke, chemicals, and fumes.  Stay active, but remember to rest as well.  Learn and use tips on how to relax.  Make sure you get enough sleep. Most adults need at least 7 hours of sleep every night.  Eat healthy foods. Eat smaller meals more often. Rest before meals. Controlled breathing Learn and use tips on how to control your breathing as told by your doctor. Try:  Breathing in (inhaling) through your nose for 1 second. Then, pucker your lips and breath out (exhale) through your lips for 2 seconds.  Putting one hand on your belly (abdomen). Breathe in slowly through your nose for 1 second. Your hand on your belly should move out. Pucker your lips and breathe out slowly through your lips. Your hand on your belly should move in as you breathe out.  Controlled coughing Learn  and use controlled coughing to clear mucus from your lungs. Follow these steps: 1. Lean your head a little forward. 2. Breathe in deeply. 3. Try to hold your breath for 3 seconds. 4. Keep your mouth slightly open while coughing 2 times. 5. Spit any mucus out into a tissue. 6. Rest and do the steps again 1 or 2 times as needed. General instructions  Make sure you get all the shots (vaccines) that your doctor recommends. Ask your doctor about a flu shot and a pneumonia shot.  Use oxygen therapy and pulmonary rehabilitation if told by your doctor. If you need home oxygen therapy, ask your doctor if you should buy a tool to measure your oxygen level (oximeter).  Make a COPD action plan with your doctor. This helps you to know what to do if you feel worse than usual.  Manage any other conditions you have as told by your doctor.  Avoid going outside when it is very hot, cold, or humid.  Avoid people who have a sickness you can catch (contagious).  Keep all follow-up visits as told by your doctor. This is important. Contact a doctor if:  You cough up more mucus than usual.  There is a change in the color or thickness of the mucus.  It is harder to breathe than usual.  Your breathing is faster than usual.  You have trouble sleeping.  You need to use your medicines more often than usual.  You have trouble doing your normal activities such as getting dressed   or walking around the house. Get help right away if:  You have shortness of breath while resting.  You have shortness of breath that stops you from: ? Being able to talk. ? Doing normal activities.  Your chest hurts for longer than 5 minutes.  Your skin color is more blue than usual.  Your pulse oximeter shows that you have low oxygen for longer than 5 minutes.  You have a fever.  You feel too tired to breathe normally. Summary  Chronic obstructive pulmonary disease (COPD) is a long-term lung problem.  The way your  lungs work will never return to normal. Usually the condition gets worse over time. There are things you can do to keep yourself as healthy as possible.  Take over-the-counter and prescription medicines only as told by your doctor.  If you smoke, stop. Smoking makes the problem worse. This information is not intended to replace advice given to you by your health care provider. Make sure you discuss any questions you have with your health care provider. Document Revised: 09/18/2017 Document Reviewed: 11/10/2016 Elsevier Patient Education  2020 Elsevier Inc.  

## 2020-08-03 NOTE — Assessment & Plan Note (Signed)
Chronic, ongoing.  Recently reestablished with pulmonary, continue this collaboration and current inhaler regimen.  Recommend she scheduled annual lung CT scan for follow-up and provided husband number to call.  Return in 6 months for follow-up, sooner if worsening symptoms.

## 2020-08-03 NOTE — Assessment & Plan Note (Signed)
Noted on CT 07/25/2018.  Continue daily statin and ASA.  Continue cessation of smoking.

## 2020-08-03 NOTE — Assessment & Plan Note (Addendum)
Chronic, ongoing with BP initially elevated today, but repeat at goal.  Continue current medication regimen and adjust as needed.  Will continue Lisinopril for kidney protection, consider change to Losartan next visit due to underlying COPD.  Recommend continue to monitor BP at home daily and focus on DASH diet.  Will check BMP and TSH next visit, recent labs on file.  Return in 6 months.

## 2020-08-05 DIAGNOSIS — J449 Chronic obstructive pulmonary disease, unspecified: Secondary | ICD-10-CM | POA: Diagnosis not present

## 2020-08-06 ENCOUNTER — Ambulatory Visit: Payer: Medicare Other

## 2020-08-06 ENCOUNTER — Telehealth: Payer: Self-pay | Admitting: Nurse Practitioner

## 2020-08-06 NOTE — Telephone Encounter (Signed)
DONALD Minkler, Patient husband is calling to state that the advanced home care came to pick up oxygen. Patient is wanting a lighter portable oxygen. Husband reports that the current oxygen is leaking. Please advise CB- [phone]

## 2020-08-07 NOTE — Telephone Encounter (Signed)
Called and spoke with patient's husband. Patient see's Dr. Duwayne Heck for pulmonary and it looks like their office had sent an order to [facility] for this last month. Advised patient's husband to reach out to her office to follow up with this request as they are the ones managing. Patient's husband verbalized understanding.

## 2020-08-08 ENCOUNTER — Telehealth: Payer: Self-pay | Admitting: Pulmonary Disease

## 2020-08-08 NOTE — Telephone Encounter (Signed)
Lm for patient.  

## 2020-08-09 ENCOUNTER — Telehealth: Payer: Self-pay | Admitting: Pulmonary Disease

## 2020-08-09 DIAGNOSIS — J449 Chronic obstructive pulmonary disease, unspecified: Secondary | ICD-10-CM

## 2020-08-09 NOTE — Telephone Encounter (Signed)
Lm x2 for patient. Letter has been mailed to address on file per office protocol.   

## 2020-08-10 NOTE — Telephone Encounter (Signed)
Okay to place evaluation/order for POC.

## 2020-08-10 NOTE — Telephone Encounter (Signed)
Order has been placed to [facility] for [place].  Lm to make patient aware.

## 2020-08-10 NOTE — Telephone Encounter (Signed)
  Called and spoke to patient's spouse, Donald(DPR). Elenore Rota is requesting order for POC.   Dr. Patsey Berthold, please advise. Thanks

## 2020-08-13 NOTE — Telephone Encounter (Signed)
Patient's spouse, Donald(DPR) is aware of below message and voiced his understanding.  Nothing further needed.

## 2020-08-15 ENCOUNTER — Other Ambulatory Visit: Payer: Self-pay | Admitting: Nurse Practitioner

## 2020-08-15 DIAGNOSIS — F17200 Nicotine dependence, unspecified, uncomplicated: Secondary | ICD-10-CM

## 2020-08-15 DIAGNOSIS — I10 Essential (primary) hypertension: Secondary | ICD-10-CM

## 2020-08-15 NOTE — Telephone Encounter (Signed)
Requested Prescriptions  Pending Prescriptions Disp Refills  . amLODipine (NORVASC) 5 MG tablet [Pharmacy Med Name: AMLODIPINE BESYLATE 5 MG TAB] 90 tablet 0    Sig: Take 1 tablet (5 mg total) by mouth daily.     Cardiovascular:  Calcium Channel Blockers Passed - 08/15/2020  2:09 PM      Passed - Last BP in normal range    BP Readings from Last 1 Encounters:  08/03/20 126/68         Passed - Valid encounter within last 6 months    Recent Outpatient Visits          1 week ago COPD, severe ([place])   [place], [place] T, NP   3 months ago COPD, severe ([place])   McCorm[place], Jolene T, NP   10 months ago COPD, severe ([place])   [place], Henrine Screws T, NP   2 years ago Colon cancer screening   [place], NP   2 years ago Chronic kidney disease, stage 3 ([place])   [place]agan [name], NP      Future Appointments            In 1 month Tyler Pita, MD [place]   In 2 months  [facility], [place]   In 6 months Algood, Barbaraann Faster, NP [facility], [place]

## 2020-08-16 DIAGNOSIS — I10 Essential (primary) hypertension: Secondary | ICD-10-CM | POA: Diagnosis not present

## 2020-08-16 DIAGNOSIS — N2581 Secondary hyperparathyroidism of renal origin: Secondary | ICD-10-CM | POA: Diagnosis not present

## 2020-08-16 DIAGNOSIS — N1832 Chronic kidney disease, stage 3b: Secondary | ICD-10-CM | POA: Diagnosis not present

## 2020-08-16 DIAGNOSIS — D631 Anemia in chronic kidney disease: Secondary | ICD-10-CM | POA: Diagnosis not present

## 2020-08-29 ENCOUNTER — Other Ambulatory Visit: Payer: Medicare Other

## 2020-08-29 ENCOUNTER — Ambulatory Visit: Payer: Medicare Other

## 2020-08-31 ENCOUNTER — Telehealth: Payer: Self-pay | Admitting: General Practice

## 2020-08-31 ENCOUNTER — Telehealth: Payer: Medicare Other

## 2020-08-31 NOTE — Telephone Encounter (Signed)
  Chronic Care Management   Outreach Note  08/31/2020 Name: Madison Franklin MRN: [id number] DOB: Oct 03, 1946  Referred by: Venita Lick, NP Reason for referral : Chronic Care Management (RNCM Follow up outreach for Chronic Disease Management and care coordination needs)   An unsuccessful telephone outreach was attempted today. The patient was referred to the case management team for assistance with care management and care coordination.   Follow Up Plan: A HIPAA compliant phone message was left for the patient providing contact information and requesting a return call.   Noreene Larsson RN, MSN, [facility] Mobile: [phone]

## 2020-09-05 DIAGNOSIS — J449 Chronic obstructive pulmonary disease, unspecified: Secondary | ICD-10-CM | POA: Diagnosis not present

## 2020-09-10 DIAGNOSIS — J449 Chronic obstructive pulmonary disease, unspecified: Secondary | ICD-10-CM | POA: Diagnosis not present

## 2020-09-11 NOTE — Telephone Encounter (Signed)
Pt has been r/s  

## 2020-09-17 ENCOUNTER — Ambulatory Visit (INDEPENDENT_AMBULATORY_CARE_PROVIDER_SITE_OTHER): Payer: Medicare Other | Admitting: Pulmonary Disease

## 2020-09-17 ENCOUNTER — Other Ambulatory Visit: Payer: Self-pay

## 2020-09-17 ENCOUNTER — Encounter: Payer: Self-pay | Admitting: Pulmonary Disease

## 2020-09-17 VITALS — BP 124/74 | HR 94 | Temp 97.1°F | Ht 61.0 in | Wt 155.0 lb

## 2020-09-17 DIAGNOSIS — I272 Pulmonary hypertension, unspecified: Secondary | ICD-10-CM

## 2020-09-17 DIAGNOSIS — J449 Chronic obstructive pulmonary disease, unspecified: Secondary | ICD-10-CM | POA: Diagnosis not present

## 2020-09-17 DIAGNOSIS — R942 Abnormal results of pulmonary function studies: Secondary | ICD-10-CM

## 2020-09-17 DIAGNOSIS — J9611 Chronic respiratory failure with hypoxia: Secondary | ICD-10-CM

## 2020-09-17 DIAGNOSIS — G4733 Obstructive sleep apnea (adult) (pediatric): Secondary | ICD-10-CM | POA: Diagnosis not present

## 2020-09-17 NOTE — Progress Notes (Signed)
Subjective:    Patient ID: Madison Franklin, female    DOB: December 02, 1945, 74 y.o.   MRN: [id number]  Requesting MD/Service:Cheryl Julian Hy, NP.  Current primary provider Marnee Guarneri, NP Date of initial consultation:07/23/18by Dr. Merton Border Reason for consultation:Former smoker, exertional dyspnea  PT PROFILE: 74 y.o.femaleformer smoker(approx 15 P-Y history)referred for evaluation of exertional dyspnea  PROBLEMS: Former smoker Severe thoracic kyphoscoliosis Moderate to severe restrictive pulmonary physiology SEVERE pulmonary hypertension   DATA: CT chest 03/13/17: Severe thoracic kyphoscoliosis. 4 mm nodule in left upper lobe (decreased in size from previously). Minimal emphysema Echocardiogram 05/28/17:LVEF 60-65%. Grade 1 diastolic dysfunction. No significant valvular disease noted. No evidence of right-sided overload. RHC 07/14/17:Mild to moderate pulmonary hypertension (mean PAP 34 mmHg, PVR 5.7 [place] units) PFTs 10/01/17:FVC: 1.28 L (49 %pred), FEV1: 0.83 >0.91 L (41 >45 %pred), FEV1/FVC: 65%; lung volume measurements invalid, DLCO 6.6 (31 %pred) ONO 10/26/17: Minimal brief desaturation. Does not require nocturnal O2 LDCT chest 06/01/18:Lung-RADS 2, benign appearance or behavior. Aortic atherosclerosis, coronary artery atherosclerosis and emphysema. Pulmonary artery enlargement suggests pulmonary arterial hypertension. Gallbladder wall calcifications indicative of "porcelain gallbladder". Very large thyroid gland extending into upper mediastinum. 2D echo 06/12/2020: LVEF 60 to 65%.  Grade 1 diastolic dysfunction.  Elevated left atrial pressure.  Increased size of right ventricle.  Severely elevated pulmonary artery systolic pressure.  Estimated right ventricular systolic pressure 11.5 mmHg. PFTs 06/12/2020: Severe restriction, element of obstruction also evident by flow volume loop.  Diffusion capacity impaired moderately.   INTERVAL: Last seen09/15/2021 by  me. No worsening of symptoms in the interval. Sleep study performed in October obstructive sleep apnea noted.  HPI [name] presents for follow-up today.  She has yet to receive her CPAP.  She is due for an appointment on the 30th for fitting of her CPAP.  She has been doing well using oxygen 24/7.  Tolerating the portable concentrator well.  She is compliant with her oxygen therapy.  She remains with poor insight as to the severity of her issues.  She does not endorse any fevers, chills or sweats.  Since starting oxygen therapy, sleeps well.  No dyspnea as long as she is using her oxygen.  Voices no active complaint today.  Review of Systems A 10 point review of systems was performed and it is as noted above otherwise negative.  Patient Active Problem List   Diagnosis Date Noted  . Aortic atherosclerosis ([place]) 07/29/2020  . Chronic respiratory failure with hypoxia ([place]) 07/29/2020  . severe restrictive pulmonary physiology due to severe kyphoscoliosis 07/04/2020  . Porcelain gallbladder 11/30/2019  . Pulmonary arterial hypertension ([place]) 10/02/2019  . Intestinal metaplasia of gastric mucosa   . Chronic atrophic gastritis without bleeding 12/15/2018  . Iron deficiency anemia 08/04/2018  . B12 deficiency 08/04/2018  . Nodule of upper lobe of left lung 05/26/2018  . Anemia in chronic kidney disease 05/26/2018  . Coronary artery calcification 06/18/2017  . Heart murmur 04/13/2017  . Chronic kidney disease, stage 3 ([place]) 04/13/2017  . Advanced care planning/counseling discussion 01/05/2017  . Hx of colonic polyps   . Benign neoplasm of cecum   . Benign neoplasm of descending colon   . Benign neoplasm of transverse colon   . Polyp of sigmoid colon   . Osteopenia 07/26/2015  . COPD, severe ([place]) 07/26/2015  . Essential hypertension 07/26/2015  . Hyperlipidemia 07/26/2015   Allergies  Allergen Reactions  . Latex Itching and Rash    With latex gloves (when worn)   Current  Meds    Medication Sig  . acetaminophen (TYLENOL) 325 MG tablet Take 325 mg by mouth every 6 (six) hours as needed (for pain.).  Marland Kitchen albuterol (VENTOLIN HFA) 108 (90 Base) MCG/ACT inhaler Inhale 2 puffs into the lungs every 6 (six) hours as needed for wheezing or shortness of breath.  Marland Kitchen amLODipine (NORVASC) 5 MG tablet Take 1 tablet (5 mg total) by mouth daily.  Marland Kitchen aspirin EC 81 MG tablet Take 81 mg by mouth daily.  Marland Kitchen atorvastatin (LIPITOR) 20 MG tablet Take 1 tablet (20 mg total) by mouth daily.  . hydrochlorothiazide (MICROZIDE) 12.5 MG capsule Take 1 capsule (12.5 mg total) by mouth daily.  Marland Kitchen lisinopril (ZESTRIL) 40 MG tablet Take 1 tablet (40 mg total) by mouth daily.  . Tiotropium Bromide-Olodaterol 2.5-2.5 MCG/ACT AERS Inhale 2 puffs into the lungs daily.  . vitamin B-12 (CYANOCOBALAMIN) 1000 MCG tablet Take 1,000 mcg by mouth daily.  . Vitamin D, Cholecalciferol, 1000 units TABS Take 1 tablet by mouth daily.   Immunization History  Administered Date(s) Administered  . Fluad Quad(high Dose 65+) 08/03/2020  . Influenza, High Dose Seasonal PF 09/03/2016  . Influenza,inj,Quad PF,6+ Mos 07/27/2015  . Influenza,inj,quad, With Preservative 08/24/2018, 07/15/2019  . Influenza-Unspecified 08/14/2017, 08/24/2018  . PFIZER SARS-COV-2 Vaccination 12/15/2019, 01/11/2020  . Pneumococcal Conjugate-13 01/12/2015  . Pneumococcal Polysaccharide-23 07/01/2011  . Td 07/27/2015      Objective:   Physical Exam BP 124/74 (BP Location: Left Arm, Cuff Size: Normal)   Pulse 94   Temp (!) 97.1 F (36.2 C) (Temporal)   Ht 5\' 1"  (1.549 m)   Wt 155 lb (70.3 kg)   LMP  (LMP Unknown)   SpO2 98%   BMI 29.29 kg/m  GENERAL: Awake alert, no distress. HEAD: Normocephalic, atraumatic. Wears hearing aids. EYES: Pupils equal, round, reactive to light. No scleral icterus.  MOUTH: Nose/mouth/throat not examined due to masking requirements for COVID 19. NECK: Supple. Thyroid diffusely enlarged. Deviated to the  right. No JVD. No adenopathy. PULMONARY: Distant breath sounds, coarse, no other adventitious sounds.Marland Kitchen CARDIOVASCULAR: S1 and S2. Regular rate and rhythm. Loud holosystolic murmur grade 4/6 best heard at the left sternal border  ABDOMEN: Soft, nondistended, nontender. MUSCULOSKELETAL: Very severe kyphoscoliosis. NEUROLOGIC: No focal deficits. SKIN: Intact,warm,dry. No overt rashes noted. PSYCH: Flat affect, normal behavior       Assessment & Plan:     ICD-10-CM   1. severe restrictive pulmonary physiology due to severe kyphoscoliosis  R94.2    Continue supplemental oxygen  2. Chronic obstructive pulmonary disease, unspecified COPD type ([place])  J44.9    Mild component, continue Stiolto No evidence of decompensation  3. Chronic respiratory failure with hypoxia (HCC)  J96.11    Continue oxygen at 2 L/min  4. Severe pulmonary hypertension (HCC)  I27.20    Due to the above issues Continue oxygen supplementation  5. OSA (obstructive sleep apnea)  G47.33    Awaiting CPAP, ordered Will need CPAP 7 cm water pressure   Discussion:  Despite the severity of her issues, she appears well compensated with the use of oxygen.  She is compliant with this.  She is to continue the same.  She is to get CPAP for obstructive sleep apnea which complicates matters above.  We will see her in follow-up 90 days after CPAP instituted.  She is to contact us prior to that time should any new difficulties arise.  Renold Don, MD [facility]   *This note was dictated using voice recognition  software/Dragon.  Despite best efforts to proofread, errors can occur which can change the meaning.  Any change was purely unintentional.

## 2020-09-17 NOTE — Patient Instructions (Signed)
Continue using your oxygen.  Start CPAP (mask for sleeping) after being set up.  This is important.  We will see you in follow-up in 3 months time call sooner should any new problems arise.

## 2020-09-18 ENCOUNTER — Inpatient Hospital Stay: Payer: Medicare Other

## 2020-09-18 ENCOUNTER — Inpatient Hospital Stay: Payer: Medicare Other | Attending: Hematology and Oncology

## 2020-09-18 ENCOUNTER — Other Ambulatory Visit: Payer: Self-pay

## 2020-09-18 VITALS — BP 154/77 | HR 90 | Temp 97.4°F | Resp 18

## 2020-09-18 DIAGNOSIS — D631 Anemia in chronic kidney disease: Secondary | ICD-10-CM | POA: Insufficient documentation

## 2020-09-18 DIAGNOSIS — N183 Chronic kidney disease, stage 3 unspecified: Secondary | ICD-10-CM

## 2020-09-18 DIAGNOSIS — I129 Hypertensive chronic kidney disease with stage 1 through stage 4 chronic kidney disease, or unspecified chronic kidney disease: Secondary | ICD-10-CM | POA: Insufficient documentation

## 2020-09-18 LAB — CBC WITH DIFFERENTIAL/PLATELET
Abs Immature Granulocytes: 0.05 10*3/uL (ref 0.00–0.07)
Basophils Absolute: 0.1 10*3/uL (ref 0.0–0.1)
Basophils Relative: 1 %
Eosinophils Absolute: 0.3 10*3/uL (ref 0.0–0.5)
Eosinophils Relative: 3 %
HCT: 33.8 % — ABNORMAL LOW (ref 36.0–46.0)
Hemoglobin: 9.9 g/dL — ABNORMAL LOW (ref 12.0–15.0)
Immature Granulocytes: 1 %
Lymphocytes Relative: 16 %
Lymphs Abs: 1.5 10*3/uL (ref 0.7–4.0)
MCH: 25.4 pg — ABNORMAL LOW (ref 26.0–34.0)
MCHC: 29.3 g/dL — ABNORMAL LOW (ref 30.0–36.0)
MCV: 86.7 fL (ref 80.0–100.0)
Monocytes Absolute: 0.7 10*3/uL (ref 0.1–1.0)
Monocytes Relative: 8 %
Neutro Abs: 6.7 10*3/uL (ref 1.7–7.7)
Neutrophils Relative %: 71 %
Platelets: 185 10*3/uL (ref 150–400)
RBC: 3.9 MIL/uL (ref 3.87–5.11)
RDW: 14.4 % (ref 11.5–15.5)
WBC: 9.3 10*3/uL (ref 4.0–10.5)
nRBC: 0 % (ref 0.0–0.2)

## 2020-09-18 LAB — VITAMIN B12: Vitamin B-12: 965 pg/mL — ABNORMAL HIGH (ref 180–914)

## 2020-09-18 LAB — FERRITIN: Ferritin: 20 ng/mL (ref 11–307)

## 2020-09-18 LAB — FOLATE: Folate: 12.6 ng/mL (ref >5.9)

## 2020-09-18 MED ORDER — EPOETIN ALFA 10000 UNIT/ML IJ SOLN: 10000.0000 [IU] | Freq: Once | INTRAMUSCULAR | Status: DC

## 2020-09-18 MED ORDER — EPOETIN ALFA-EPBX 10000 UNIT/ML IJ SOLN
10000.0000 [IU] | Freq: Once | INTRAMUSCULAR | Status: AC
  Administered 2020-09-18: 10000 [IU] via SUBCUTANEOUS

## 2020-09-19 ENCOUNTER — Telehealth: Payer: Self-pay

## 2020-09-19 DIAGNOSIS — G4733 Obstructive sleep apnea (adult) (pediatric): Secondary | ICD-10-CM | POA: Diagnosis not present

## 2020-09-19 NOTE — Telephone Encounter (Signed)
-----   Message from Lequita Asal, MD sent at 09/18/2020  5:11 PM EST ----- Regarding: Please call patient  Ferritin is low.    Begin ferrous sulfate 325 mg po q day with OJ or vitamin C.  Increase to BID if tolerating well.  Check CBC and ferritin in 4-6 weeks.  M ----- Message ----- From: Buel Ream, [facility] [place] Sent: 09/18/2020  11:02 AM EST To: Lequita Asal, MD

## 2020-09-19 NOTE — Telephone Encounter (Signed)
Left message for patient to call back  

## 2020-09-20 ENCOUNTER — Telehealth: Payer: Self-pay | Admitting: *Deleted

## 2020-09-20 DIAGNOSIS — N183 Chronic kidney disease, stage 3 unspecified: Secondary | ICD-10-CM

## 2020-09-20 DIAGNOSIS — D631 Anemia in chronic kidney disease: Secondary | ICD-10-CM

## 2020-09-20 DIAGNOSIS — D509 Iron deficiency anemia, unspecified: Secondary | ICD-10-CM

## 2020-09-20 NOTE — Telephone Encounter (Signed)
RN spoke with pt and caregiver and notified ferritin level was low and Dr Mike Gip wanted her to start taking Iron 325 mg qd with vitamin C or OJ.  If pt tolerates well MD wanted pt to increase iron to twice a day and return to clinic in 4-6 weeks for lab work.  Pt and caregiver verbalized understanding and RN stated a scheduler would call with lab appt later today.

## 2020-10-05 DIAGNOSIS — J449 Chronic obstructive pulmonary disease, unspecified: Secondary | ICD-10-CM | POA: Diagnosis not present

## 2020-10-10 DIAGNOSIS — J449 Chronic obstructive pulmonary disease, unspecified: Secondary | ICD-10-CM | POA: Diagnosis not present

## 2020-10-18 ENCOUNTER — Ambulatory Visit: Payer: Medicare Other

## 2020-10-20 DIAGNOSIS — G4733 Obstructive sleep apnea (adult) (pediatric): Secondary | ICD-10-CM | POA: Diagnosis not present

## 2020-10-25 ENCOUNTER — Inpatient Hospital Stay: Payer: Medicare Other | Attending: Hematology and Oncology

## 2020-10-25 ENCOUNTER — Other Ambulatory Visit: Payer: Self-pay

## 2020-10-25 ENCOUNTER — Telehealth: Payer: Self-pay

## 2020-10-25 DIAGNOSIS — D631 Anemia in chronic kidney disease: Secondary | ICD-10-CM | POA: Diagnosis not present

## 2020-10-25 DIAGNOSIS — E538 Deficiency of other specified B group vitamins: Secondary | ICD-10-CM | POA: Insufficient documentation

## 2020-10-25 DIAGNOSIS — N183 Chronic kidney disease, stage 3 unspecified: Secondary | ICD-10-CM | POA: Diagnosis not present

## 2020-10-25 DIAGNOSIS — I129 Hypertensive chronic kidney disease with stage 1 through stage 4 chronic kidney disease, or unspecified chronic kidney disease: Secondary | ICD-10-CM | POA: Diagnosis not present

## 2020-10-25 DIAGNOSIS — D509 Iron deficiency anemia, unspecified: Secondary | ICD-10-CM | POA: Insufficient documentation

## 2020-10-25 LAB — CBC WITH DIFFERENTIAL/PLATELET
Abs Immature Granulocytes: 0.05 10*3/uL (ref 0.00–0.07)
Basophils Absolute: 0.1 10*3/uL (ref 0.0–0.1)
Basophils Relative: 1 %
Eosinophils Absolute: 0.2 10*3/uL (ref 0.0–0.5)
Eosinophils Relative: 2 %
HCT: 34.7 % — ABNORMAL LOW (ref 36.0–46.0)
Hemoglobin: 10.2 g/dL — ABNORMAL LOW (ref 12.0–15.0)
Immature Granulocytes: 1 %
Lymphocytes Relative: 17 %
Lymphs Abs: 1.5 10*3/uL (ref 0.7–4.0)
MCH: 24.8 pg — ABNORMAL LOW (ref 26.0–34.0)
MCHC: 29.4 g/dL — ABNORMAL LOW (ref 30.0–36.0)
MCV: 84.2 fL (ref 80.0–100.0)
Monocytes Absolute: 0.7 10*3/uL (ref 0.1–1.0)
Monocytes Relative: 8 %
Neutro Abs: 6.1 10*3/uL (ref 1.7–7.7)
Neutrophils Relative %: 71 %
Platelets: 180 10*3/uL (ref 150–400)
RBC: 4.12 MIL/uL (ref 3.87–5.11)
RDW: 14 % (ref 11.5–15.5)
WBC: 8.6 10*3/uL (ref 4.0–10.5)
nRBC: 0 % (ref 0.0–0.2)

## 2020-10-25 LAB — FERRITIN: Ferritin: 29 ng/mL (ref 11–307)

## 2020-10-26 ENCOUNTER — Telehealth: Payer: Self-pay

## 2020-10-26 ENCOUNTER — Ambulatory Visit (INDEPENDENT_AMBULATORY_CARE_PROVIDER_SITE_OTHER): Payer: Medicare Other

## 2020-10-26 VITALS — Ht 61.0 in | Wt 155.0 lb

## 2020-10-26 DIAGNOSIS — Z0000 Encounter for general adult medical examination without abnormal findings: Secondary | ICD-10-CM | POA: Diagnosis not present

## 2020-10-26 NOTE — Telephone Encounter (Signed)
Patient consented to telehealth visit. 

## 2020-10-26 NOTE — Progress Notes (Signed)
I connected with Olin Pia today by telephone and verified that I am speaking with the correct person using two identifiers. Location patient: home Location provider: work Persons participating in the virtual visit: Phylicia Mcgaugh, spouse Yarden Manuelito, Glenna Durand LPN.   I discussed the limitations, risks, security and privacy concerns of performing an evaluation and management service by telephone and the availability of in person appointments. I also discussed with the patient that there may be a patient responsible charge related to this service. The patient expressed understanding and verbally consented to this telephonic visit.    Interactive audio and video telecommunications were attempted between this provider and patient, however failed, due to patient having technical difficulties OR patient did not have access to video capability.  We continued and completed visit with audio only.     Vital signs may be patient reported or missing.  Subjective:   Madison Franklin is a 75 y.o. female who presents for Medicare Annual (Subsequent) preventive examination.  Review of Systems     Cardiac Risk Factors include: advanced age (>92mn, >>51women);hypertension;sedentary lifestyle     Objective:    Today's Vitals   10/26/20 0912  Weight: 155 lb (70.3 kg)  Height: '5\' 1"'  (1.549 m)   Body mass index is 29.29 kg/m.  Advanced Directives 10/26/2020 10/17/2019 05/25/2019 05/11/2019 11/24/2018 11/10/2018 10/14/2018  Does Patient Have a Medical Advance Directive? No No No No No No No  Does patient want to make changes to medical advance directive? - - - - - - -  Would patient like information on creating a medical advance directive? - - No - Patient declined Yes (MAU/Ambulatory/Procedural Areas - Information given) No - Patient declined No - Patient declined No - Patient declined    Current Medications (verified) Outpatient Encounter Medications as of 10/26/2020  Medication Sig  . acetaminophen  (TYLENOL) 325 MG tablet Take 325 mg by mouth every 6 (six) hours as needed (for pain.).  .Marland Kitchenalbuterol (VENTOLIN HFA) 108 (90 Base) MCG/ACT inhaler Inhale 2 puffs into the lungs every 6 (six) hours as needed for wheezing or shortness of breath.  .Marland KitchenamLODipine (NORVASC) 5 MG tablet Take 1 tablet (5 mg total) by mouth daily.  .Marland Kitchenaspirin EC 81 MG tablet Take 81 mg by mouth daily.  .Marland Kitchenatorvastatin (LIPITOR) 20 MG tablet Take 1 tablet (20 mg total) by mouth daily.  . hydrochlorothiazide (MICROZIDE) 12.5 MG capsule Take 1 capsule (12.5 mg total) by mouth daily.  .Marland Kitchenlisinopril (ZESTRIL) 40 MG tablet Take 1 tablet (40 mg total) by mouth daily.  . Tiotropium Bromide-Olodaterol 2.5-2.5 MCG/ACT AERS Inhale 2 puffs into the lungs daily.  . vitamin B-12 (CYANOCOBALAMIN) 1000 MCG tablet Take 1,000 mcg by mouth daily.  . Vitamin D, Cholecalciferol, 1000 units TABS Take 1 tablet by mouth daily.   No facility-administered encounter medications on file as of 10/26/2020.    Allergies (verified) Latex   History: Past Medical History:  Diagnosis Date  . [place]   . Dyspnea   . Hearing aid worn    bilateral  . Hyperlipidemia   . Hypertension   . Osteoporosis   . Personal history of tobacco use, presenting hazards to health 08/15/2015  . Pulmonary hypertension, mild (HCC)    mild to moderate   Past Surgical History:  Procedure Laterality Date  . ABDOMINAL HYSTERECTOMY    . COLONOSCOPY WITH PROPOFOL N/A 11/14/2016   Procedure: COLONOSCOPY WITH PROPOFOL;  Surgeon: DLucilla Lame MD;  Location: [place];  Service: Endoscopy;  Laterality: N/A;  . COLONOSCOPY WITH PROPOFOL N/A 11/10/2018   Procedure: COLONOSCOPY WITH PROPOFOL;  Surgeon: Lin Landsman, MD;  Location: [place];  Service: Endoscopy;  Laterality: N/A;  . ESOPHAGOGASTRODUODENOSCOPY (EGD) WITH PROPOFOL  11/10/2018   Procedure: ESOPHAGOGASTRODUODENOSCOPY (EGD) WITH PROPOFOL;  Surgeon:  Lin Landsman, MD;  Location: [place];  Service: Endoscopy;;  . ESOPHAGOGASTRODUODENOSCOPY (EGD) WITH PROPOFOL N/A 05/11/2019   Procedure: ESOPHAGOGASTRODUODENOSCOPY (EGD) WITH PROPOFOL;  Surgeon: Lin Landsman, MD;  Location: [place];  Service: Endoscopy;  Laterality: N/A;  . long nodule    . POLYPECTOMY  11/14/2016   Procedure: POLYPECTOMY;  Surgeon: [name], MD;  Location: [place];  Service: Endoscopy;;  . POLYPECTOMY  11/10/2018   Procedure: POLYPECTOMY;  Surgeon: Lin Landsman, MD;  Location: [place];  Service: Endoscopy;;  . RIGHT HEART CATH Right 07/14/2017   Procedure: RIGHT HEART CATH;  Surgeon: Nelva Bush, MD;  Location: [facility];  Service: Cardiovascular;  Laterality: Right;   Family History  Problem Relation Age of Onset  . Heart disease Neg Hx    Social History   Socioeconomic History  . Marital status: Married    Spouse name: Not on file  . Number of children: Not on file  . Years of education: 82  . Highest education level: 12th grade  Occupational History  . Occupation: retired  Tobacco Use  . Smoking status: Former Smoker    Packs/day: 0.50    Years: 30.00    Pack years: 15.00    Types: Cigarettes    Quit date: 01/17/2017    Years since quitting: 3.7  . Smokeless tobacco: Never Used  Vaping Use  . Vaping Use: Never used  Substance and Sexual Activity  . Alcohol use: No    Alcohol/week: 0.0 standard drinks  . Drug use: No  . Sexual activity: Yes  Other Topics Concern  . Not on file  Social History Narrative  . Not on file   Social Determinants of Health   Financial Resource Strain: Low Risk   . Difficulty of Paying Living Expenses: Not hard at all  Food Insecurity: No Food Insecurity  . Worried About Charity fundraiser in the Last Year: Never true  . Ran Out of Food in the Last Year: Never true  Transportation Needs: No Transportation Needs  . Lack of  Transportation (Medical): No  . Lack of Transportation (Non-Medical): No  Physical Activity: Inactive  . Days of Exercise per Week: 0 days  . Minutes of Exercise per Session: 0 min  Stress: No Stress Concern Present  . Feeling of Stress : Not at all  Social Connections: Socially Integrated  . Frequency of Communication with Friends and Family: More than three times a week  . Frequency of Social Gatherings with Friends and Family: More than three times a week  . Attends Religious Services: More than 4 times per year  . Active Member of Clubs or Organizations: Yes  . Attends Archivist Meetings: More than 4 times per year  . Marital Status: Married    Tobacco Counseling Counseling given: Not Answered   Clinical Intake:  Pre-visit preparation completed: Yes  Pain : No/denies pain     Nutritional Status: BMI 25 -29 Overweight Nutritional Risks: Nausea/ vomitting/ diarrhea (diarrhea a couple weeks ago) Diabetes: No  How often do you need to have someone help you when you read  instructions, pamphlets, or other written materials from your doctor or pharmacy?: 1 - Never What is the last grade level you completed in school?: 12th grade  Diabetic? no  Interpreter Needed?: No  Information entered by :: NAllen LPN   Activities of Daily Living In your present state of health, do you have any difficulty performing the following activities: 10/26/2020  Hearing? Y  Comment hearing aides  Vision? N  Difficulty concentrating or making decisions? N  Walking or climbing stairs? Y  Dressing or bathing? N  Doing errands, shopping? N  Preparing Food and eating ? N  Using the Toilet? N  In the past six months, have you accidently leaked urine? N  Do you have problems with loss of bowel control? N  Managing your Medications? N  Managing your Finances? N  Housekeeping or managing your Housekeeping? N  Some recent data might be hidden    Patient Care Team: Venita Lick, NP as PCP - General (Nurse Practitioner) [name], MD as Consulting Physician (Gastroenterology) Anthonette Legato, MD (Internal Medicine) Jules Husbands, MD as Consulting Physician (General Surgery) Vanita Ingles, RN as Case Manager (General Practice)  Indicate any recent Medical Services you may have received from other than [facility] providers in the past year (date may be approximate).     Assessment:   This is a routine wellness examination for [place].  Hearing/Vision screen  Hearing Screening   '125Hz'  '250Hz'  '500Hz'  '1000Hz'  '2000Hz'  '3000Hz'  '4000Hz'  '6000Hz'  '8000Hz'   Right ear:           Left ear:           Vision Screening Comments: No regular eye exams  Dietary issues and exercise activities discussed: Current Exercise Habits: The patient does not participate in regular exercise at present  Goals    .  DIET - INCREASE WATER INTAKE      Recommend drinking at least 6-8 glasses of water a day    .  Patient Stated      10/26/2020, no goals    .  PharmD "I can't afford my medication" (pt-stated)      CARE PLAN ENTRY (see longitudinal plan of care for additional care plan information)  Current Barriers:  . Polypharmacy; complex patient with multiple comorbidities including COPD, HLD, HTN . Financial concerns - patient reports today that she cannot afford Anoro . Most recent eGFR: ~ 40 mL/min - last saw nephrology Dr. Holley Raring in 2019 o COPD: Anoro prescribed, but too expensive so she does not have. Albuterol HFA PRN, usually 1-2 times daily o HRC:BULAGTXMIW 5 mg daily, HCTZ 12.5 mg daily, lisinopril 40 mg o ASCVD risk reduction: atorvastatin 10 mg daily; last LDL 105. Reports adherence. PCP noted she wanted to increase to 20 mg daily, but does not appear patient was informed; ASA 81 mg daily o Supplements: Vitamin B12, vitamin D  Pharmacist Clinical Goal(s):  Marland Kitchen Over the next 90 days, patient will work with PharmD and provider towards optimized medication  management  Interventions: . Comprehensive medication review performed; medication list updated in electronic medical record . Inter-disciplinary care team collaboration (see longitudinal plan of care) . Discussed income. Patient meets income requirement for Anoro through Home, but has not spent the required $600 out of pocket on her prescriptions. She would meet requirements for Stiolto through [place]. Will collaborate w/ PCP to order Stiolto 2.5/2.5 mcg 2 puffs daily, No Print. Will collaborate w/ CPhT to mail patient portion of the application. Will collaborate w/  PCP for provider portion. Once all parts received, will submit to [facility].  . Discussed lipids. Patient amenable to increasing atorvastatin. She will take 2 of the 10 mg tabs until completed, I will collaborate w/ PCP to order atorvastatin 20 mg daily . She is amenable to getting back in w/ nephrology Dr. Holley Raring. Will collaborate w/ PCP for referral  Patient Self Care Activities:  . Patient will take medications as prescribed  Initial goal documentation     .  RNCM: We try to do what is right and take care of each other      Current Barriers:  . Chronic Disease Management support, education, and care coordination needs related to HTN, HLD, COPD, CKD Stage 3, and Nodule of Upper lobe left lung  Clinical Goal(s) related to HTN, HLD, COPD, CKD Stage 3, and nodule of upper lobe left lung :  Over the next 120 days, patient will:  . Work with the care management team to address educational, disease management, and care coordination needs  . Begin or continue self health monitoring activities as directed today Measure and record blood pressure 5 times per week . Call provider office for new or worsened signs and symptoms Blood pressure findings outside established parameters, Oxygen saturation lower than established parameter, Shortness of breath, and New or worsened symptom related to CKD3/HLD/Nodule of Upper lobe left lung . Call care  management team with questions or concerns . Verbalize basic understanding of patient centered plan of care established today  Interventions related to HTN, HLD, COPD, CKD Stage 3, and Nodule of upper lobe left lung :  . Evaluation of current treatment plans and patient's adherence to plan as established by provider. Per the patients husband the patient has completed her treatments for lung nodule.  Denies any new concerns with her health at this time  . Assessed patient understanding of disease states.  04-18-2020: The patients husband states he thinks the patient may need to come in for evaluation. He has noticed that the patient easily gets short of breath when doing any type of activity. Sometimes he lets her use his oxygen. He feels the patient needs to be evaluated for oxygen. He does have a pulse ox device and does check her oxygen levels at times. The patients husband says depending on what she is doing and how she feels her levels are 87 to 91.  Education on worsening s/s of condition and when to seek emergent care. The patients husband verbalized understanding. Will send an in basket message to front staff asking for staff to schedule an appointment to see the pcp. Will also let pcp know of new concern with shortness of breath.  . Assessed patient's education and care coordination needs.  04-18-2020: Education and support given to the patient and patients husband on changes in condition and calling pcp for needs. . Provided disease specific education to patient, patient states she does not use sodium in her diet  . Collaborated with appropriate clinical care team members regarding patient needs . Evaluation of blood pressure readings. The patient is taking almost daily and recording. Had not taken blood pressure today and could not readily provide readings to the [facility]. 04-18-2020: Per the husband they take her blood pressure daily and record. At the time of the call the patient had not taken her  blood pressure today and could not provide readings. The husband states it has been "good". . Evaluated activity level. The patient is not doing a lot of  activity per her husband. Some but not as much as she should.  The patients husband states she does what she needs to do and rest when she needs to rest.  . Evaluated upcoming appointments:  The patient does not have any upcoming appointments, Review of CCM team availability to assist as needed with questions about health and well being. Will send an in basket message to get an appointment for evaluation with pcp.   Patient Self Care Activities related to HTN, HLD, COPD, CKD Stage 3, and Nodule of upper lobe left lung :  . Patient is unable to independently self-manage chronic health conditions  Please see past updates related to this goal by clicking on the "Past Updates" button in the selected goal        Depression Screen PHQ 2/9 Scores 10/26/2020 01/25/2020 10/17/2019 10/14/2018 10/05/2017 09/30/2017 09/29/2016  PHQ - 2 Score 0 0 0 0 0 0 0    Fall Risk Fall Risk  10/26/2020 07/09/2020 10/17/2019 07/04/2019 12/01/2018  Falls in the past year? 0 0 0 0 0  Number falls in past yr: - 0 0 0 0  Injury with Fall? - 0 0 0 -  Risk for fall due to : Medication side effect - - - -  Follow up Falls evaluation completed;Education provided;Falls prevention discussed - - - Falls evaluation completed    FALL RISK PREVENTION PERTAINING TO THE HOME:  Any stairs in or around the home? No  If so, are there any without handrails? n/a Home free of loose throw rugs in walkways, pet beds, electrical cords, etc? Yes  Adequate lighting in your home to reduce risk of falls? Yes   ASSISTIVE DEVICES UTILIZED TO PREVENT FALLS:  Life alert? No  Use of a cane, walker or w/c? Yes  Grab bars in the bathroom? No  Shower chair or bench in shower? No  Elevated toilet seat or a handicapped toilet? No   TIMED UP AND GO:  Was the test performed? No . .    Cognitive  Function:     6CIT Screen 10/26/2020 10/14/2018 09/30/2017  What Year? 0 points 0 points 0 points  What month? 0 points 0 points 0 points  What time? 0 points 0 points 0 points  Count back from 20 0 points 0 points 0 points  Months in reverse 4 points 2 points 0 points  Repeat phrase 10 points 10 points 4 points  Total Score '14 12 4    ' Immunizations Immunization History  Administered Date(s) Administered  . Fluad Quad(high Dose 65+) 08/03/2020  . Influenza, High Dose Seasonal PF 09/03/2016  . Influenza,inj,Quad PF,6+ Mos 07/27/2015  . Influenza,inj,quad, With Preservative 08/24/2018, 07/15/2019  . Influenza-Unspecified 08/14/2017, 08/24/2018  . Moderna Sars-Covid-2 Vaccination 10/18/2020  . PFIZER SARS-COV-2 Vaccination 12/15/2019, 01/11/2020  . Pneumococcal Conjugate-13 01/12/2015  . Pneumococcal Polysaccharide-23 07/01/2011  . Td 07/27/2015    TDAP status: Up to date  Flu Vaccine status: Up to date  Pneumococcal vaccine status: Up to date  Covid-19 vaccine status: Completed vaccines  Qualifies for Shingles Vaccine? Yes   Zostavax completed No   Shingrix Completed?: No.    Education has been provided regarding the importance of this vaccine. Patient has been advised to call insurance company to determine out of pocket expense if they have not yet received this vaccine. Advised may also receive vaccine at local pharmacy or Health Dept. Verbalized acceptance and understanding.  Screening Tests Health Maintenance  Topic Date Due  . COLONOSCOPY (Pts  45-92yr Insurance coverage will need to be confirmed)  11/10/2021  . TETANUS/TDAP  07/26/2025  . INFLUENZA VACCINE  Completed  . DEXA SCAN  Completed  . COVID-19 Vaccine  Completed  . Hepatitis C Screening  Completed  . PNA vac Low Risk Adult  Completed    Health Maintenance  There are no preventive care reminders to display for this patient.  Colorectal cancer screening: No longer required.   Mammogram status: No  longer required due to age.  Bone Density status: Completed 07/27/2013.   Lung Cancer Screening: (Low Dose CT Chest recommended if Age 76-80years, 30 pack-year currently smoking OR have quit w/in 15years.) does not qualify.   Lung Cancer Screening Referral: no  Additional Screening:  Hepatitis C Screening: does qualify; Completed 07/27/2015  Vision Screening: Recommended annual ophthalmology exams for early detection of glaucoma and other disorders of the eye. Is the patient up to date with their annual eye exam?  No  Who is the provider or what is the name of the office in which the patient attends annual eye exams? none If pt is not established with a provider, would they like to be referred to a provider to establish care? No .   Dental Screening: Recommended annual dental exams for proper oral hygiene  Community Resource Referral / Chronic Care Management: CRR required this visit?  No   CCM required this visit?  No      Plan:     I have personally reviewed and noted the following in the patient's chart:   . Medical and social history . Use of alcohol, tobacco or illicit drugs  . Current medications and supplements . Functional ability and status . Nutritional status . Physical activity . Advanced directives . List of other physicians . Hospitalizations, surgeries, and ER visits in previous 12 months . Vitals . Screenings to include cognitive, depression, and falls . Referrals and appointments  In addition, I have reviewed and discussed with patient certain preventive protocols, quality metrics, and best practice recommendations. A written personalized care plan for preventive services as well as general preventive health recommendations were provided to patient.     NKellie Simmering LPN   08/26/8371  Nurse Notes:

## 2020-10-26 NOTE — Patient Instructions (Signed)
Ms. Madison Franklin , Thank you for taking time to come for your Medicare Wellness Visit. I appreciate your ongoing commitment to your health goals. Please review the following plan we discussed and let me know if I can assist you in the future.   Screening recommendations/referrals: Colonoscopy: not required Mammogram: not required Bone Density: completed 07/27/2013 Recommended yearly ophthalmology/optometry visit for glaucoma screening and checkup Recommended yearly dental visit for hygiene and checkup  Vaccinations: Influenza vaccine: completed 08/03/2020, due 05/20/2021 Pneumococcal vaccine: completed 01/12/2015 Tdap vaccine: completed 07/27/2015, due 07/26/2025 Shingles vaccine: discussed   Covid-19: 10/18/2020,01/11/2020, 12/15/2019  Advanced directives: Advance directive discussed with you today. Even though you declined this today please call our office should you change your mind and we can give you the proper paperwork for you to fill out.   Conditions/risks identified: none  Next appointment: Follow up in one year for your annual wellness visit    Preventive Care 65 Years and Older, Female Preventive care refers to lifestyle choices and visits with your health care provider that can promote health and wellness. What does preventive care include?  A yearly physical exam. This is also called an annual well check.  Dental exams once or twice a year.  Routine eye exams. Ask your health care provider how often you should have your eyes checked.  Personal lifestyle choices, including:  Daily care of your teeth and gums.  Regular physical activity.  Eating a healthy diet.  Avoiding tobacco and drug use.  Limiting alcohol use.  Practicing safe sex.  Taking low-dose aspirin every day.  Taking vitamin and mineral supplements as recommended by your health care provider. What happens during an annual well check? The services and screenings done by your health care provider during  your annual well check will depend on your age, overall health, lifestyle risk factors, and family history of disease. Counseling  Your health care provider may ask you questions about your:  Alcohol use.  Tobacco use.  Drug use.  Emotional well-being.  Home and relationship well-being.  Sexual activity.  Eating habits.  History of falls.  Memory and ability to understand (cognition).  Work and work Statistician.  Reproductive health. Screening  You may have the following tests or measurements:  Height, weight, and BMI.  Blood pressure.  Lipid and cholesterol levels. These may be checked every 5 years, or more frequently if you are over 5 years old.  Skin check.  Lung cancer screening. You may have this screening every year starting at age 73 if you have a 30-pack-year history of smoking and currently smoke or have quit within the past 15 years.  Fecal occult blood test (FOBT) of the stool. You may have this test every year starting at age 49.  Flexible sigmoidoscopy or colonoscopy. You may have a sigmoidoscopy every 5 years or a colonoscopy every 10 years starting at age 45.  Hepatitis C blood test.  Hepatitis B blood test.  Sexually transmitted disease (STD) testing.  Diabetes screening. This is done by checking your blood sugar (glucose) after you have not eaten for a while (fasting). You may have this done every 1-3 years.  Bone density scan. This is done to screen for osteoporosis. You may have this done starting at age 9.  Mammogram. This may be done every 1-2 years. Talk to your health care provider about how often you should have regular mammograms. Talk with your health care provider about your test results, treatment options, and if necessary, the need for  more tests. Vaccines  Your health care provider may recommend certain vaccines, such as:  Influenza vaccine. This is recommended every year.  Tetanus, diphtheria, and acellular pertussis (Tdap,  Td) vaccine. You may need a Td booster every 10 years.  Zoster vaccine. You may need this after age 39.  Pneumococcal 13-valent conjugate (PCV13) vaccine. One dose is recommended after age 45.  Pneumococcal polysaccharide (PPSV23) vaccine. One dose is recommended after age 53. Talk to your health care provider about which screenings and vaccines you need and how often you need them. This information is not intended to replace advice given to you by your health care provider. Make sure you discuss any questions you have with your health care provider. Document Released: 11/02/2015 Document Revised: 06/25/2016 Document Reviewed: 08/07/2015 Elsevier Interactive Patient Education  2017 [employer] Prevention in the Home Falls can cause injuries. They can happen to people of all ages. There are many things you can do to make your home safe and to help prevent falls. What can I do on the outside of my home?  Regularly fix the edges of walkways and driveways and fix any cracks.  Remove anything that might make you trip as you walk through a door, such as a raised step or threshold.  Trim any bushes or trees on the path to your home.  Use bright outdoor lighting.  Clear any walking paths of anything that might make someone trip, such as rocks or tools.  Regularly check to see if handrails are loose or broken. Make sure that both sides of any steps have handrails.  Any raised decks and porches should have guardrails on the edges.  Have any leaves, snow, or ice cleared regularly.  Use sand or salt on walking paths during winter.  Clean up any spills in your garage right away. This includes oil or grease spills. What can I do in the bathroom?  Use night lights.  Install grab bars by the toilet and in the tub and shower. Do not use towel bars as grab bars.  Use non-skid mats or decals in the tub or shower.  If you need to sit down in the shower, use a plastic, non-slip  stool.  Keep the floor dry. Clean up any water that spills on the floor as soon as it happens.  Remove soap buildup in the tub or shower regularly.  Attach bath mats securely with double-sided non-slip rug tape.  Do not have throw rugs and other things on the floor that can make you trip. What can I do in the bedroom?  Use night lights.  Make sure that you have a light by your bed that is easy to reach.  Do not use any sheets or blankets that are too big for your bed. They should not hang down onto the floor.  Have a firm chair that has side arms. You can use this for support while you get dressed.  Do not have throw rugs and other things on the floor that can make you trip. What can I do in the kitchen?  Clean up any spills right away.  Avoid walking on wet floors.  Keep items that you use a lot in easy-to-reach places.  If you need to reach something above you, use a strong step stool that has a grab bar.  Keep electrical cords out of the way.  Do not use floor polish or wax that makes floors slippery. If you must use wax, use non-skid  floor wax.  Do not have throw rugs and other things on the floor that can make you trip. What can I do with my stairs?  Do not leave any items on the stairs.  Make sure that there are handrails on both sides of the stairs and use them. Fix handrails that are broken or loose. Make sure that handrails are as long as the stairways.  Check any carpeting to make sure that it is firmly attached to the stairs. Fix any carpet that is loose or worn.  Avoid having throw rugs at the top or bottom of the stairs. If you do have throw rugs, attach them to the floor with carpet tape.  Make sure that you have a light switch at the top of the stairs and the bottom of the stairs. If you do not have them, ask someone to add them for you. What else can I do to help prevent falls?  Wear shoes that:  Do not have high heels.  Have rubber bottoms.  Are  comfortable and fit you well.  Are closed at the toe. Do not wear sandals.  If you use a stepladder:  Make sure that it is fully opened. Do not climb a closed stepladder.  Make sure that both sides of the stepladder are locked into place.  Ask someone to hold it for you, if possible.  Clearly mark and make sure that you can see:  Any grab bars or handrails.  First and last steps.  Where the edge of each step is.  Use tools that help you move around (mobility aids) if they are needed. These include:  Canes.  Walkers.  Scooters.  Crutches.  Turn on the lights when you go into a dark area. Replace any light bulbs as soon as they burn out.  Set up your furniture so you have a clear path. Avoid moving your furniture around.  If any of your floors are uneven, fix them.  If there are any pets around you, be aware of where they are.  Review your medicines with your doctor. Some medicines can make you feel dizzy. This can increase your chance of falling. Ask your doctor what other things that you can do to help prevent falls. This information is not intended to replace advice given to you by your health care provider. Make sure you discuss any questions you have with your health care provider. Document Released: 08/02/2009 Document Revised: 03/13/2016 Document Reviewed: 11/10/2014 Elsevier Interactive Patient Education  2017 [employer].

## 2020-11-03 DIAGNOSIS — G4733 Obstructive sleep apnea (adult) (pediatric): Secondary | ICD-10-CM | POA: Diagnosis not present

## 2020-11-05 DIAGNOSIS — J449 Chronic obstructive pulmonary disease, unspecified: Secondary | ICD-10-CM | POA: Diagnosis not present

## 2020-11-06 ENCOUNTER — Telehealth: Payer: Medicare Other | Admitting: General Practice

## 2020-11-06 ENCOUNTER — Ambulatory Visit: Payer: Self-pay | Admitting: General Practice

## 2020-11-06 DIAGNOSIS — E782 Mixed hyperlipidemia: Secondary | ICD-10-CM

## 2020-11-06 DIAGNOSIS — J449 Chronic obstructive pulmonary disease, unspecified: Secondary | ICD-10-CM

## 2020-11-06 DIAGNOSIS — I10 Essential (primary) hypertension: Secondary | ICD-10-CM

## 2020-11-06 NOTE — Chronic Care Management (AMB) (Signed)
Chronic Care Management   CCM RN Visit Note  11/06/2020 Name: Madison Franklin MRN: [id number] DOB: [birth year]  Subjective: Madison Franklin is a 75 y.o. year old female who is a primary care patient of Cannady, Barbaraann Faster, NP. The care management team was consulted for assistance with disease management and care coordination needs.    Engaged with patient by telephone for follow up visit in response to provider referral for case management and/or care coordination services.   Consent to Services:  The patient is currently being managed by CCM team  Patient agreed to services and verbal consent obtained.   Assessment: Review of patient past medical history, allergies, medications, health status, including review of consultants reports, laboratory and other test data, was performed as part of comprehensive evaluation and provision of chronic care management services.   SDOH (Social Determinants of Health) assessments and interventions performed:    CCM Care Plan  Allergies  Allergen Reactions   Latex Itching and Rash    With latex gloves (when worn)    Outpatient Encounter Medications as of 11/06/2020  Medication Sig   acetaminophen (TYLENOL) 325 MG tablet Take 325 mg by mouth every 6 (six) hours as needed (for pain.).   albuterol (VENTOLIN HFA) 108 (90 Base) MCG/ACT inhaler Inhale 2 puffs into the lungs every 6 (six) hours as needed for wheezing or shortness of breath.   amLODipine (NORVASC) 5 MG tablet Take 1 tablet (5 mg total) by mouth daily.   aspirin EC 81 MG tablet Take 81 mg by mouth daily.   atorvastatin (LIPITOR) 20 MG tablet Take 1 tablet (20 mg total) by mouth daily.   hydrochlorothiazide (MICROZIDE) 12.5 MG capsule Take 1 capsule (12.5 mg total) by mouth daily.   lisinopril (ZESTRIL) 40 MG tablet Take 1 tablet (40 mg total) by mouth daily.   Tiotropium Bromide-Olodaterol 2.5-2.5 MCG/ACT AERS Inhale 2 puffs into the lungs daily.   vitamin B-12 (CYANOCOBALAMIN) 1000 MCG  tablet Take 1,000 mcg by mouth daily.   Vitamin D, Cholecalciferol, 1000 units TABS Take 1 tablet by mouth daily.   No facility-administered encounter medications on file as of 11/06/2020.    Patient Active Problem List   Diagnosis Date Noted   Aortic atherosclerosis ([place]) 07/29/2020   Chronic respiratory failure with hypoxia ([place]) 07/29/2020   severe restrictive pulmonary physiology due to severe kyphoscoliosis 07/04/2020   Porcelain gallbladder 11/30/2019   Pulmonary arterial hypertension ([place]) 10/02/2019   Intestinal metaplasia of gastric mucosa    Chronic atrophic gastritis without bleeding 12/15/2018   Iron deficiency anemia 08/04/2018   B12 deficiency 08/04/2018   Nodule of upper lobe of left lung 05/26/2018   Anemia in chronic kidney disease 05/26/2018   Coronary artery calcification 06/18/2017   Heart murmur 04/13/2017   Chronic kidney disease, stage 3 ([place]) 04/13/2017   Advanced care planning/counseling discussion 01/05/2017   Hx of colonic polyps    Benign neoplasm of cecum    Benign neoplasm of descending colon    Benign neoplasm of transverse colon    Polyp of sigmoid colon    Osteopenia 07/26/2015   COPD, severe ([place]) 07/26/2015   Essential hypertension 07/26/2015   Hyperlipidemia 07/26/2015    Conditions to be addressed/monitored:HTN, HLD and COPD  Care Plan : RNCM: HLD Management  Updates made by Vanita Ingles since 11/06/2020 12:00 AM    Problem: RNCM: HLD Management   Priority: Medium    Goal: RNCM: HLD   Priority: Medium  Note:  Current Barriers:  °• Poorly controlled hyperlipidemia, complicated by HTN, COPD  °• Current antihyperlipidemic regimen: Lipitor 20 mg °• Most recent lipid panel:  °   °Component Value Date/Time  ° CHOL 192 04/20/2020 1419  ° TRIG 205 (H) 04/20/2020 1419  ° HDL 52 04/20/2020 1419  ° LDLCALC 105 (H) 04/20/2020 1419 •  ° °• ASCVD risk enhancing conditions: age >65,  HTN, CKD, former smoker °• Unable to  independently manage HLD °• Lacks social connections °• Does not contact provider office for questions/concerns ° °RN Care Manager Clinical Goal(s):  °• Over the next 120 days, patient will work with RN Care Manager, providers, and care team towards execution of optimized self-health management plan °• Over the next 120 days, patient will verbalize understanding of plan for effective management of HLD °• Over the next 120 days, patient will attend all scheduled medical appointments: 02-12-2021 ° °Interventions: °• Collaboration with Cannady, Jolene T, NP regarding development and update of comprehensive plan of care as evidenced by provider attestation and co-signature °• Inter-disciplinary care team collaboration (see longitudinal plan of care) °• Medication review performed; medication list updated in electronic medical record.  °• Inter-disciplinary care team collaboration (see longitudinal plan of care) °• Referred to pharmacy team for assistance with HLD medication management °• Evaluation of current treatment plan related to HLD and patient's adherence to plan as established by provider. °• Advised patient to call the office for changes in condition or questions °• Reviewed scheduled/upcoming provider appointments including: 01-01-2021 at 1030 am ° ° °Patient Goals/Self-Care Activities: °• Over the next 120 days, patient will:   °- call for medicine refill 2 or 3 days before it runs out °- call if I am sick and can't take my medicine °- keep a list of all the medicines I take; vitamins and herbals too °- learn to read medicine labels °- use a pillbox to sort medicine °- use an alarm clock or phone to remind me to take my medicine °- change to whole grain breads, cereal, pasta °- drink 6 to 8 glasses of water each day °- eat 5 or 6 small meals each day °- limit fast food meals to no more than 1 per week °- prepare main meal at home 3 to 5 days each week °- read food labels for fat, fiber, carbohydrates and  portion size °- be open to making changes °- I can manage, know and watch for signs of a heart attack °- if I have chest pain, call for help °- learn about small changes that will make a big difference °- learn my personal risk factors °- education plan reviewed and/or amended °- empathy and reassurance conveyed °- family/caregiver participation in learning encouraged °- health literacy screen reviewed °- patient's preferred learning methods utilized °- questions encouraged °- readiness to learn monitored ° °Follow Up Plan: Telephone follow up appointment with care management team member scheduled for: 01-01-2021 at 1030 am °  °  °Task: Identify Deficit and Optimize Health Literacy   °Note:   °Care Management Activities:  °  °- education plan reviewed and/or amended °- empathy and reassurance conveyed °- family/caregiver participation in learning encouraged °- health literacy screen reviewed °- patient's preferred learning methods utilized °- questions encouraged °- readiness to learn monitored  °  ° °  °Care Plan : RNCM: Hypertension (Adult)  °Updates made by Tate, Pamela J since 11/06/2020 12:00 AM  °  °Problem: RNCM: Hypertension (Hypertension)   °Priority: Medium  °  °  Goal: RNCM: Hypertension Monitored   Priority: Medium  Note:    Objective:   Last practice recorded BP readings:  BP Readings from Last 3 Encounters:  09/18/20 (!) 154/77  09/17/20 124/74  08/03/20 126/68     Most recent eGFR/CrCl: No results found for: EGFR  No components found for: CRCL Current Barriers:   Knowledge Deficits related to basic understanding of hypertension pathophysiology and self care management  Knowledge Deficits related to understanding of medications prescribed for management of hypertension  Limited Social Support  Unable to independently manage HTN  Unable to self administer medications as prescribed  Does not contact provider office for questions/concerns Case Manager Clinical Goal(s):   Over the  next 120 days, patient will verbalize understanding of plan for hypertension management  Over the next 120 days, patient will attend all scheduled medical appointments: 02-12-2021  Over the next 120 days, patient will demonstrate improved adherence to prescribed treatment plan for hypertension as evidenced by taking all medications as prescribed, monitoring and recording blood pressure as directed, adhering to low sodium/DASH diet  Over the next 120 days, patient will demonstrate improved health management independence as evidenced by checking blood pressure as directed and notifying PCP if SBP>160 or DBP > 90, taking all medications as prescribe, and adhering to a low sodium diet as discussed. Interventions:   Collaboration with Venita Lick, NP regarding development and update of comprehensive plan of care as evidenced by provider attestation and co-signature  Inter-disciplinary care team collaboration (see longitudinal plan of care)  Evaluation of current treatment plan related to hypertension self management and patient's adherence to plan as established by provider.  Provided education to patient re: stroke prevention, s/s of heart attack and stroke, DASH diet, complications of uncontrolled blood pressure  Reviewed medications with patient and discussed importance of compliance  Discussed plans with patient for ongoing care management follow up and provided patient with direct contact information for care management team  Advised patient, providing education and rationale, to monitor blood pressure daily and record, calling PCP for findings outside established parameters.   Reviewed scheduled/upcoming provider appointments including: 02-12-2021 Patient Goals/Self-Care Activities  Over the next 120 days, patient will:  - UNABLE to independently manage HTN Self administers medications as prescribed Attends all scheduled provider appointments Calls provider office for new concerns,  questions, or BP outside discussed parameters Checks BP and records as discussed Follows a low sodium diet/DASH diet - blood pressure trends reviewed - depression screen reviewed - home or ambulatory blood pressure monitoring encouraged Follow Up Plan: Telephone follow up appointment with care management team member scheduled for: 01-01-2021 at 10:30 am   Task: RNCM: Identify and Monitor Blood Pressure Elevation   Note:   Care Management Activities:    - blood pressure trends reviewed - depression screen reviewed - home or ambulatory blood pressure monitoring encouraged       Care Plan : RNCM: COPD (Adult)  Updates made by Vanita Ingles since 11/06/2020 12:00 AM    Problem: RNCM: Psychological Adjustment to Diagnosis (COPD)   Priority: Medium    Goal: RNCM: COPD Adjustment to Disease Achieved   Note:   Current Barriers:   Knowledge deficits related to basic understanding of COPD disease process  Knowledge deficits related to basic COPD self care/management  Knowledge deficit related to basic understanding of how to use inhalers and how inhaled medications work  Knowledge deficit related to importance of energy conservation  Limited Social Support  Unable to independently  manage COPD °• Lacks social connections °• Does not contact provider office for questions/concerns  °Case Manager Clinical Goal(s): °· Over the next 120 days patient will report using inhalers as prescribed including rinsing mouth after use °· Over the next 120 days patient will report utilizing pursed lip breathing for shortness of breath °· Over the next 120 days, patient will be able to verbalize understanding of COPD action plan and when to seek appropriate levels of medical care °· Over the next 120 days, patient will engage in lite exercise as tolerated to build/regain stamina and strength and reduce shortness of breath through activity tolerance °· Over the next 120 days, patient will verbalize basic  understanding of COPD disease process and self care activities °· Over the next 120 days, patient will not be hospitalized for COPD exacerbation  °Interventions:  °• Collaboration with Cannady, Jolene T, NP regarding development and update of comprehensive plan of care as evidenced by provider attestation and co-signature °• Inter-disciplinary care team collaboration (see longitudinal plan of care) °· Provided patient with basic written and verbal COPD education on self care/management/and exacerbation prevention  °· Provided patient with COPD action plan and reinforced importance of daily self assessment °· Discussed Pulmonary Rehab and offered to assist with referral placement °· Provided written and verbal instructions on pursed lip breathing and utilized returned demonstration as teach back °· Provided instruction about proper use of medications used for management of COPD including inhalers °· Advised patient to self assesses COPD action plan zone and make appointment with provider if in the yellow zone for 48 hours without improvement. °· Provided patient with education about the role of exercise in the management of COPD °· Advised patient to engage in light exercise as tolerated 3-5 days a week °· Provided education about and advised patient to utilize infection prevention strategies to reduce risk of respiratory infection  °Patient Goals/Self-Care Activities:  °• - depression screen reviewed °• - emotional support provided °• - family involvement promoted °• - problem-solving facilitated °• - relaxation techniques promoted °• - verbalization of feelings encouraged °Follow Up Plan: Telephone follow up appointment with care management team member scheduled for: 01-01-2021 at 10:30 am °  °Task: RNCM: Support Psychosocial Response to Chronic Obstructive Pulmonary Disease   °Note:   °Care Management Activities:  °  °- depression screen reviewed °- emotional support provided °- family involvement promoted °-  problem-solving facilitated °- relaxation techniques promoted °- verbalization of feelings encouraged °  ° ° °Plan:Telephone follow up appointment with care management team member scheduled for:  01-01-2021 at 1030 am ° °Pam Tate RN, MSN, CCM °Community Care Coordinator °[place]   [facility] °[facility] °Mobile: [phone] ° ° ° ° ° ° ° ° °

## 2020-11-06 NOTE — Patient Instructions (Signed)
Visit Information  Patient Care Plan: RNCM: HLD Management    Problem Identified: RNCM: HLD Management   Priority: Medium    Goal: RNCM: HLD   Priority: Medium  Note:   Current Barriers:  . Poorly controlled hyperlipidemia, complicated by HTN, COPD  . Current antihyperlipidemic regimen: Lipitor 20 mg . Most recent lipid panel:     Component Value Date/Time   CHOL 192 04/20/2020 1419   TRIG 205 (H) 04/20/2020 1419   HDL 52 04/20/2020 1419   LDLCALC 105 (H) 04/20/2020 1419 .   Marland Kitchen ASCVD risk enhancing conditions: age >86,  HTN, CKD, former smoker . Unable to independently manage HLD . Lacks social connections . Does not contact provider office for questions/concerns  RN Care Manager Clinical Goal(s):  Marland Kitchen Over the next 120 days, patient will work with Consulting civil engineer, providers, and care team towards execution of optimized self-health management plan . Over the next 120 days, patient will verbalize understanding of plan for effective management of HLD . Over the next 120 days, patient will attend all scheduled medical appointments: 02-12-2021  Interventions: . Collaboration with Venita Lick, NP regarding development and update of comprehensive plan of care as evidenced by provider attestation and co-signature . Inter-disciplinary care team collaboration (see longitudinal plan of care) . Medication review performed; medication list updated in electronic medical record.  Bertram Savin care team collaboration (see longitudinal plan of care) . Referred to pharmacy team for assistance with HLD medication management . Evaluation of current treatment plan related to HLD and patient's adherence to plan as established by provider. . Advised patient to call the office for changes in condition or questions . Reviewed scheduled/upcoming provider appointments including: 01-01-2021 at 1030 am   Patient Goals/Self-Care Activities: . Over the next 120 days, patient will:   - call for  medicine refill 2 or 3 days before it runs out - call if I am sick and can't take my medicine - keep a list of all the medicines I take; vitamins and herbals too - learn to read medicine labels - use a pillbox to sort medicine - use an alarm clock or phone to remind me to take my medicine - change to whole grain breads, cereal, pasta - drink 6 to 8 glasses of water each day - eat 5 or 6 small meals each day - limit fast food meals to no more than 1 per week - prepare main meal at home 3 to 5 days each week - read food labels for fat, fiber, carbohydrates and portion size - be open to making changes - I can manage, know and watch for signs of a heart attack - if I have chest pain, call for help - learn about small changes that will make a big difference - learn my personal risk factors - education plan reviewed and/or amended - empathy and reassurance conveyed - family/caregiver participation in learning encouraged - health literacy screen reviewed - patient's preferred learning methods utilized - questions encouraged - readiness to learn monitored  Follow Up Plan: Telephone follow up appointment with care management team member scheduled for: 01-01-2021 at 1030 am     Task: Identify Deficit and Optimize Health Literacy   Note:   Care Management Activities:    - education plan reviewed and/or amended - empathy and reassurance conveyed - family/caregiver participation in learning encouraged - health literacy screen reviewed - patient's preferred learning methods utilized - questions encouraged - readiness to learn monitored  Patient Care Plan: RNCM: Hypertension (Adult)    Problem Identified: RNCM: Hypertension (Hypertension)   Priority: Medium    Goal: RNCM: Hypertension Monitored   Priority: Medium  Note:    Objective:  . Last practice recorded BP readings:  BP Readings from Last 3 Encounters:  09/18/20 (!) 154/77  09/17/20 124/74  08/03/20 126/68  .   Marland Kitchen Most recent eGFR/CrCl: No results found for: EGFR  No components found for: CRCL Current Barriers:  Marland Kitchen Knowledge Deficits related to basic understanding of hypertension pathophysiology and self care management . Knowledge Deficits related to understanding of medications prescribed for management of hypertension . Limited Social Support . Unable to independently manage HTN . Unable to self administer medications as prescribed . Does not contact provider office for questions/concerns Case Manager Clinical Goal(s):  Marland Kitchen Over the next 120 days, patient will verbalize understanding of plan for hypertension management . Over the next 120 days, patient will attend all scheduled medical appointments: 02-12-2021 . Over the next 120 days, patient will demonstrate improved adherence to prescribed treatment plan for hypertension as evidenced by taking all medications as prescribed, monitoring and recording blood pressure as directed, adhering to low sodium/DASH diet . Over the next 120 days, patient will demonstrate improved health management independence as evidenced by checking blood pressure as directed and notifying PCP if SBP>160 or DBP > 90, taking all medications as prescribe, and adhering to a low sodium diet as discussed. Interventions:  . Collaboration with Venita Lick, NP regarding development and update of comprehensive plan of care as evidenced by provider attestation and co-signature . Inter-disciplinary care team collaboration (see longitudinal plan of care) . Evaluation of current treatment plan related to hypertension self management and patient's adherence to plan as established by provider. . Provided education to patient re: stroke prevention, s/s of heart attack and stroke, DASH diet, complications of uncontrolled blood pressure . Reviewed medications with patient and discussed importance of compliance . Discussed plans with patient for ongoing care management follow up and  provided patient with direct contact information for care management team . Advised patient, providing education and rationale, to monitor blood pressure daily and record, calling PCP for findings outside established parameters.  . Reviewed scheduled/upcoming provider appointments including: 02-12-2021 Patient Goals/Self-Care Activities . Over the next 120 days, patient will:  - UNABLE to independently manage HTN Self administers medications as prescribed Attends all scheduled provider appointments Calls provider office for new concerns, questions, or BP outside discussed parameters Checks BP and records as discussed Follows a low sodium diet/DASH diet - blood pressure trends reviewed - depression screen reviewed - home or ambulatory blood pressure monitoring encouraged Follow Up Plan: Telephone follow up appointment with care management team member scheduled for: 01-01-2021 at 10:30 am   Task: RNCM: Identify and Monitor Blood Pressure Elevation   Note:   Care Management Activities:    - blood pressure trends reviewed - depression screen reviewed - home or ambulatory blood pressure monitoring encouraged       Patient Care Plan: RNCM: COPD (Adult)    Problem Identified: RNCM: Psychological Adjustment to Diagnosis (COPD)   Priority: Medium    Goal: RNCM: COPD Adjustment to Disease Achieved   Note:   Current Barriers:  Marland Kitchen Knowledge deficits related to basic understanding of COPD disease process . Knowledge deficits related to basic COPD self care/management . Knowledge deficit related to basic understanding of how to use inhalers and how inhaled medications work . Knowledge deficit related to importance  of energy conservation . Limited Social Support . Unable to independently manage COPD . Lacks social connections . Does not contact provider office for questions/concerns  Case Manager Clinical Goal(s):  Over the next 120 days patient will report using inhalers as prescribed  including rinsing mouth after use  Over the next 120 days patient will report utilizing pursed lip breathing for shortness of breath  Over the next 120 days, patient will be able to verbalize understanding of COPD action plan and when to seek appropriate levels of medical care  Over the next 120 days, patient will engage in lite exercise as tolerated to build/regain stamina and strength and reduce shortness of breath through activity tolerance  Over the next 120 days, patient will verbalize basic understanding of COPD disease process and self care activities  Over the next 120 days, patient will not be hospitalized for COPD exacerbation  Interventions:  . Collaboration with Venita Lick, NP regarding development and update of comprehensive plan of care as evidenced by provider attestation and co-signature . Inter-disciplinary care team collaboration (see longitudinal plan of care)  Provided patient with basic written and verbal COPD education on self care/management/and exacerbation prevention   Provided patient with COPD action plan and reinforced importance of daily self assessment  Discussed Pulmonary Rehab and offered to assist with referral placement  Provided written and verbal instructions on pursed lip breathing and utilized returned demonstration as teach back  Provided instruction about proper use of medications used for management of COPD including inhalers  Advised patient to self assesses COPD action plan zone and make appointment with provider if in the yellow zone for 48 hours without improvement.  Provided patient with education about the role of exercise in the management of COPD  Advised patient to engage in light exercise as tolerated 3-5 days a week  Provided education about and advised patient to utilize infection prevention strategies to reduce risk of respiratory infection  Patient Goals/Self-Care Activities:  . - depression screen reviewed . - emotional  support provided . - family involvement promoted . - problem-solving facilitated . - relaxation techniques promoted . - verbalization of feelings encouraged Follow Up Plan: Telephone follow up appointment with care management team member scheduled for: 01-01-2021 at 10:30 am   Task: RNCM: Support Psychosocial Response to Chronic Obstructive Pulmonary Disease   Note:   Care Management Activities:    - depression screen reviewed - emotional support provided - family involvement promoted - problem-solving facilitated - relaxation techniques promoted - verbalization of feelings encouraged     Patient verbalizes understanding of instructions provided today.  Telephone follow up appointment with care management team member scheduled for: 01-01-2021 1030 am  [place], MSN, [facility] Mobile: [phone]

## 2020-11-10 DIAGNOSIS — J449 Chronic obstructive pulmonary disease, unspecified: Secondary | ICD-10-CM | POA: Diagnosis not present

## 2020-11-13 ENCOUNTER — Other Ambulatory Visit: Payer: Self-pay | Admitting: Nurse Practitioner

## 2020-11-13 DIAGNOSIS — F17200 Nicotine dependence, unspecified, uncomplicated: Secondary | ICD-10-CM

## 2020-11-13 DIAGNOSIS — I10 Essential (primary) hypertension: Secondary | ICD-10-CM

## 2020-11-14 ENCOUNTER — Other Ambulatory Visit: Payer: Self-pay

## 2020-11-14 MED ORDER — ALBUTEROL SULFATE HFA 108 (90 BASE) MCG/ACT IN AERS: 2.0000 | INHALATION_SPRAY | Freq: Four times a day (QID) | RESPIRATORY_TRACT | 3 refills | Status: DC | PRN

## 2020-11-14 MED ORDER — ATORVASTATIN CALCIUM 20 MG PO TABS
20.0000 mg | ORAL_TABLET | Freq: Every day | ORAL | 3 refills | Status: DC
Start: 2020-11-14 — End: 2021-12-30

## 2020-11-16 ENCOUNTER — Other Ambulatory Visit: Payer: Self-pay

## 2020-11-16 DIAGNOSIS — F17200 Nicotine dependence, unspecified, uncomplicated: Secondary | ICD-10-CM

## 2020-11-16 DIAGNOSIS — I10 Essential (primary) hypertension: Secondary | ICD-10-CM

## 2020-11-16 MED ORDER — AMLODIPINE BESYLATE 5 MG PO TABS: 5.0000 mg | ORAL_TABLET | Freq: Every day | ORAL | 4 refills | Status: DC

## 2020-11-16 MED ORDER — LISINOPRIL 40 MG PO TABS: 40.0000 mg | ORAL_TABLET | Freq: Every day | ORAL | 4 refills | Status: DC

## 2020-11-16 NOTE — Telephone Encounter (Signed)
Needs prescriptions sent to mail order please.

## 2020-11-20 DIAGNOSIS — G4733 Obstructive sleep apnea (adult) (pediatric): Secondary | ICD-10-CM | POA: Diagnosis not present

## 2020-11-26 ENCOUNTER — Other Ambulatory Visit: Payer: Self-pay

## 2020-11-28 NOTE — Progress Notes (Signed)
[facility]  [address], [address] [place], [place] [address] Phone: [phone]  Fax: [phone]   Clinic Day:  11/29/2020  Referring physician: Venita Lick, NP  Chief Complaint: Madison Franklin is a 75 y.o. female with anemia of chronic kidney disease, iron deficiency, and B12 deficiency who is seen for 6 month assessment.  HPI: The patient was last seen in the hematology clinic on 05/29/2020. At that time, she felt fine.  She had shortness of breath on exertion.  Exam was stable. Hematocrit was 38.5, hemoglobin 11.7, platelets 202,000, WBC 9,500. Creatinine was 1.45 (CrCl 41 ml/min). Ferritin was 39 with an iron saturation of 19% and a TIBC of 360. She continued oral B12.  Pulmonary function test on 06/12/2020 showed severe restrictive lung disease without significant BD response.  The patient saw Dr. Holley Raring on 08/16/2020. She had been lost to follow up since 2019 but her chronic kidney disease appeared stable. She was to continue lisinopril 40 mg daily. Follow-up was planned for 4 months.  The patient saw Dr. Patsey Berthold on 09/17/2020. She was to continue oxygen at 2 liters/min. She was going to get a CPAP for obstructive sleep apnea. Follow-up was planned for 3 months.  Labs followed: 09/18/2020: Hematocrit 33.8, hemoglobin   9.9, MCV 86.7, platelets 185,000, WBC 9,300. Ferritin 20. Vitamin B12 965. Folate 12.6. 10/25/2020: Hematocrit 34.7, hemoglobin 10.2, MCV 84.2, platelets 180,000, WBC 8,600. Ferritin 29.  She received Retacrit 10,000 units on 09/18/2020.  During the interim, she has been "ok." Her shortness of breath has been better since she was put on oxygen. She is on 2 liters/min at all times. She has been using a CPAP machine for 2 months and has been sleeping much better. She is eating well and denies chest pain, nausea, vomiting, and diarrhea.  She had no problems with Retacrit. She takes vitamin B12 daily. She restarted iron last  month.   Past Medical History:  Diagnosis Date  . COPD (chronic obstructive pulmonary disease) ([facility])   . Dyspnea   . Hearing aid worn    bilateral  . Hyperlipidemia   . Hypertension   . Osteoporosis   . Personal history of tobacco use, presenting hazards to health 08/15/2015  . Pulmonary hypertension, mild ([facility])    mild to moderate    Past Surgical History:  Procedure Laterality Date  . ABDOMINAL HYSTERECTOMY    . COLONOSCOPY WITH PROPOFOL N/A 11/14/2016   Procedure: COLONOSCOPY WITH PROPOFOL;  Surgeon: [name], MD;  Location: [facility];  Service: Endoscopy;  Laterality: N/A;  . COLONOSCOPY WITH PROPOFOL N/A 11/10/2018   Procedure: COLONOSCOPY WITH PROPOFOL;  Surgeon: Lin Landsman, MD;  Location: [facility];  Service: Endoscopy;  Laterality: N/A;  . ESOPHAGOGASTRODUODENOSCOPY (EGD) WITH PROPOFOL  11/10/2018   Procedure: ESOPHAGOGASTRODUODENOSCOPY (EGD) WITH PROPOFOL;  Surgeon: Lin Landsman, MD;  Location: [facility];  Service: Endoscopy;;  . ESOPHAGOGASTRODUODENOSCOPY (EGD) WITH PROPOFOL N/A 05/11/2019   Procedure: ESOPHAGOGASTRODUODENOSCOPY (EGD) WITH PROPOFOL;  Surgeon: Lin Landsman, MD;  Location: [facility];  Service: Endoscopy;  Laterality: N/A;  . long nodule    . POLYPECTOMY  11/14/2016   Procedure: POLYPECTOMY;  Surgeon: [name], MD;  Location: [facility];  Service: Endoscopy;;  . POLYPECTOMY  11/10/2018   Procedure: POLYPECTOMY;  Surgeon: Lin Landsman, MD;  Location: [facility];  Service: Endoscopy;;  . RIGHT HEART CATH Right 07/14/2017   Procedure: RIGHT HEART CATH;  Surgeon: Nelva Bush,  MD;  Location: [facility];  Service: Cardiovascular;  Laterality: Right;    Family History  Problem Relation Age of Onset  . Heart disease Neg Hx     Social History:  reports that she quit smoking about 3 years ago. Her smoking use included cigarettes. She has a 15.00 pack-year  smoking history. She has never used smokeless tobacco. She reports that she does not drink alcohol and does not use drugs. She smoked 1/2 pack/day x 30 years. She quit in 2018. She is a retired Secretary/administrator. Her husband's name is Elenore Rota. Her daughter Levada Dy can be reached over the phone [phone]). The patient is accompanied by Elenore Rota today.   Allergies:  Allergies  Allergen Reactions  . Latex Itching and Rash    With latex gloves (when worn)    Current Medications: Current Outpatient Medications  Medication Sig Dispense Refill  . acetaminophen (TYLENOL) 325 MG tablet Take 325 mg by mouth every 6 (six) hours as needed (for pain.).    Marland Kitchen albuterol (VENTOLIN HFA) 108 (90 Base) MCG/ACT inhaler Inhale 2 puffs into the lungs every 6 (six) hours as needed for wheezing or shortness of breath. 16 g 3  . amLODipine (NORVASC) 5 MG tablet Take 1 tablet (5 mg total) by mouth daily. 90 tablet 4  . aspirin EC 81 MG tablet Take 81 mg by mouth daily.    Marland Kitchen atorvastatin (LIPITOR) 20 MG tablet Take 1 tablet (20 mg total) by mouth daily. 90 tablet 3  . hydrochlorothiazide (MICROZIDE) 12.5 MG capsule Take 1 capsule (12.5 mg total) by mouth daily. 90 capsule 0  . lisinopril (ZESTRIL) 40 MG tablet Take 1 tablet (40 mg total) by mouth daily. 90 tablet 4  . Tiotropium Bromide-Olodaterol 2.5-2.5 MCG/ACT AERS Inhale 2 puffs into the lungs daily. 4 g 6  . vitamin B-12 (CYANOCOBALAMIN) 1000 MCG tablet Take 1,000 mcg by mouth daily.    . Vitamin D, Cholecalciferol, 1000 units TABS Take 1 tablet by mouth daily.     No current facility-administered medications for this visit.    Review of Systems  Constitutional: Negative for chills, diaphoresis, fever, malaise/fatigue and weight loss (up 6 lbs).       Feels "okay."  HENT: Positive for hearing loss. Negative for congestion, ear discharge, ear pain, nosebleeds, sinus pain, sore throat and tinnitus.   Eyes: Negative.  Negative for blurred vision.  Respiratory:  Positive for shortness of breath (improved with oxygen). Negative for cough, hemoptysis and sputum production.        On 2 liters/min oxygen. Uses CPAP at night.  Cardiovascular: Negative.  Negative for chest pain, palpitations and leg swelling.  Gastrointestinal: Negative.  Negative for abdominal pain, blood in stool, constipation, diarrhea, heartburn, melena, nausea and vomiting.       Eating well.  Genitourinary: Negative for dysuria, frequency, hematuria and urgency.       CKD-III.  Musculoskeletal: Negative.  Negative for back pain, joint pain, myalgias and neck pain.  Skin: Negative.  Negative for itching and rash.  Neurological: Negative.  Negative for dizziness, tingling, sensory change, weakness and headaches.  Endo/Heme/Allergies: Negative.  Does not bruise/bleed easily.  Psychiatric/Behavioral: Negative.  Negative for depression and memory loss. The patient is not nervous/anxious and does not have insomnia.   All other systems reviewed and are negative.  Performance status (ECOG):  1-2  Vitals Blood pressure (!) 153/66, pulse 87, temperature 97.9 F (36.6 C), temperature source Oral, weight 155 lb 3.3 oz (70.4 kg).  Physical Exam Vitals and nursing note reviewed.  Constitutional:      General: She is not in acute distress.    Appearance: She is not diaphoretic.  HENT:     Head: Normocephalic and atraumatic.     Comments: Hearing aid    Mouth/Throat:     Mouth: Mucous membranes are moist.     Pharynx: Oropharynx is clear.  Eyes:     General: No scleral icterus.    Extraocular Movements: Extraocular movements intact.     Conjunctiva/sclera: Conjunctivae normal.     Pupils: Pupils are equal, round, and reactive to light.  Cardiovascular:     Rate and Rhythm: Normal rate and regular rhythm.     Heart sounds: Murmur (harsh aortic murmur) heard.    Pulmonary:     Effort: Pulmonary effort is normal. No respiratory distress.     Breath sounds: Normal breath sounds. No  wheezing or rales.  Chest:     Chest wall: No tenderness.  Breasts:     Right: No axillary adenopathy or supraclavicular adenopathy.     Left: No axillary adenopathy or supraclavicular adenopathy.    Abdominal:     General: Bowel sounds are normal. There is no distension.     Palpations: Abdomen is soft. There is no mass.     Tenderness: There is no abdominal tenderness. There is no guarding or rebound.  Musculoskeletal:        General: No swelling or tenderness. Normal range of motion.     Cervical back: Normal range of motion and neck supple.  Lymphadenopathy:     Head:     Right side of head: No preauricular, posterior auricular or occipital adenopathy.     Left side of head: No preauricular, posterior auricular or occipital adenopathy.     Cervical: No cervical adenopathy.     Upper Body:     Right upper body: No supraclavicular or axillary adenopathy.     Left upper body: No supraclavicular or axillary adenopathy.     Lower Body: No right inguinal adenopathy. No left inguinal adenopathy.  Skin:    General: Skin is warm and dry.  Neurological:     Mental Status: She is alert and oriented to person, place, and time.  Psychiatric:        Behavior: Behavior normal.        Thought Content: Thought content normal.        Judgment: Judgment normal.    Appointment on 11/29/2020  Component Date Value Ref Range Status  . Creatinine, Ser 11/29/2020 1.43* 0.44 - 1.00 mg/dL Final  . GFR, Estimated 11/29/2020 38* >60 mL/min Final   Comment: (NOTE) Calculated using the CKD-EPI Creatinine Equation (2021) Performed at [facility], [address], [place], [place] [address]   . WBC 11/29/2020 9.5  4.0 - 10.5 K/uL Final  . RBC 11/29/2020 4.21  3.87 - 5.11 MIL/uL Final  . Hemoglobin 11/29/2020 10.5* 12.0 - 15.0 g/dL Final  . HCT 11/29/2020 35.2* 36.0 - 46.0 % Final  . MCV 11/29/2020 83.6  80.0 - 100.0 fL Final  . MCH 11/29/2020 24.9* 26.0 - 34.0 pg Final  . MCHC  11/29/2020 29.8* 30.0 - 36.0 g/dL Final  . RDW 11/29/2020 14.9  11.5 - 15.5 % Final  . Platelets 11/29/2020 176  150 - 400 K/uL Final  . nRBC 11/29/2020 0.0  0.0 - 0.2 % Final  . Neutrophils Relative % 11/29/2020 73  % Final  . Neutro  Abs 11/29/2020 7.0  1.7 - 7.7 K/uL Final  . Lymphocytes Relative 11/29/2020 16  % Final  . Lymphs Abs 11/29/2020 1.5  0.7 - 4.0 K/uL Final  . Monocytes Relative 11/29/2020 8  % Final  . Monocytes Absolute 11/29/2020 0.7  0.1 - 1.0 K/uL Final  . Eosinophils Relative 11/29/2020 2  % Final  . Eosinophils Absolute 11/29/2020 0.1  0.0 - 0.5 K/uL Final  . Basophils Relative 11/29/2020 1  % Final  . Basophils Absolute 11/29/2020 0.1  0.0 - 0.1 K/uL Final  . Immature Granulocytes 11/29/2020 0  % Final  . Abs Immature Granulocytes 11/29/2020 0.03  0.00 - 0.07 K/uL Final   Performed at [facility], [address], [place], [place] [address]    Assessment:  Madison Franklin is a 75 y.o. female  with anemia of chronic kidney disease. She has had a gradual decline in renal function of the course of the last 8 months  Work-up on 08/07/2019revealed a hematocrit of 33.5, hemoglobin 10.3, and MCV 78.4. Ferritinwas 20. Iron saturation was 8% with a TIBC of 395. B12was 259 (low). Folate was 10.3. Creatinine was 1.51.  She hasiron deficiency anemiaandB12 deficiency. She is on oral B12. B12 was 259 on 05/26/2018 and 1080 on 08/03/2018. Anti-parietal antibody and intrinsic factor antibodies were negative on 06/30/2018. B12 was discontinued on 12/15/2018.  Oral ironbegan 08/04/2018 and discontinued on 04/27/2019.  Ferritinhas been followed: 31 on 11/24/2017, 20 on 05/26/2018, 17 on 06/30/2018, 27 on 08/03/2018, 34 on 08/31/2018, 33 on 10/11/2018, 46 on 11/23/2018, 129 on 03/23/2019, 86 on 05/25/2019, 76 on 08/25/2019, 104 on 11/30/2019, 45 on 02/27/2020, 39 on 05/29/2020, 20 on 09/18/2020 and 29 on 10/25/2020.  She has a history of colonic polyps.  Colonoscopyon 11/14/2016 revealed a sub-optimal prep. Findings included a 10 mm polyp in the cecum, seven 4-10 mm sessile polyps in the transverse colon, a 6 mm polyp in the descending colon, three 5-6 mm sessile polyps in the sigmoid colon,. Pathology revealed 7 tubular adenomas, 2 hyperplastic polyps, and 2 inflammatory polyps. Colonoscopyon 11/10/2018 revealed three 5 to 6 mm polyps in the ascending colon(2 tubular adenomas; sessile serrated adenoma),one 8 mm polyp in the transverse colon(tubular adenoma),one 5 mm polyp in the transverse colon(tubular adenoma),one 5 mm polyp in the sigmoid colon(tubular adenoma),one 7 mm polyp at the recto-sigmoid colon(hyperplastic polyp),and non-bleeding external and internal hemorrhoids.She will have a follow-up colonoscopy in 3 years.  EGDon 11/10/2018 revealed non-bleeding erosive gastropathy. Pathology revealed chronic inactive atrophic gastritis with intestinal metaplasia. There was a single non-bleeding erosion in the upper third of the esophagus.  EGD on 05/11/2019 revealed normal duodenal bulb and second portion of the duodenum. There was non-bleeding erosive gastropathy and gastric mucosal atrophy.  There was a normal gastroesophageal junction and esophagus. Pathology revealed chronic inactive atrophic gastritis with intestinal metaplasia, negative for H pylori.   She has a 15 pack year smoking history. Chest CTon 03/13/2017 revealed a 4 mm LUL pulmonary nodule (decreased from prior). Low dose chest CTon 06/01/2018 revealed Lung-RADS 2, benign appearance or behavior. There was gallbladder wall calcifications indicative of "porcelain gallbladder".   Abdomen and pelvic CTon 07/25/2018 revealed a chronically abnormal gallbladder with circumferential partially calcified wall thickening and heterogeneity. This was partially visible in 2016 and 2015. There appears to be an exophytic 3 cm component of calcified soft tissue extending  inferiorly from the main body of the gallbladder. Obliterated lumen perhaps that is gallbladder fundus is uncertain.  She has stage  III chronic renal insufficiency.  Creatinine was 1.27 on 05/25/2019.  She has received Retacrit: 06/02/2018, 06/16/2018 and 09/18/2020.  The patient received the [place] COVID-19 vaccine on 12/15/2019 and 01/11/2020.  Symptomatically, her shortness of breath has been better since she was put on oxygen. She is on 2 liters/min at all times.  Sleep has been better on CPAP.  She is eating well and denies chest pain, nausea, vomiting, and diarrhea.  She takes vitamin B12 daily. She restarted iron last month.  Exam is stable.  Plan: 1.   Labs today: CBC with diff, creatinine, ferritin, iron studies. 2.   Anemia of chronic renal disease Hematocrit 35.2.  Hemoglobin 10.5.  MCV 83.6.  She is doing well at the present time without the need for Retacrit. Continue to monitor. 3.   Iron deficiency Ferritin  32. Iron saturation 11% with a TIBC of 347. Patient restarted oral iron last month.  Ferritin goal 100. Continue to monitor.  4.   B12 deficiency B12 level was 965 and folate 12.6 on 09/18/2020. She is on oral B12.   B12 goal > 400. B12 and folate annually. 5.   Porcelain gallbladder Patient has a prominent RUQ on exam c/w porcelain gallbladder. She is followed by Dr. Dahlia Byes. Current plan is no intervention per surgery unless she is symptomatic. 6.   No Retacrit today. 7.   RTC in 3 months for labs (CBC with diff, ferritin) and +/- Retacrit. 8.   RTC in 6 months for MD assessment and labs (CBC with diff, ferritin, iron studies) and +/- Retacrit.   I discussed the assessment and treatment plan with the patient.  The patient was provided an opportunity to ask questions and all were answered.  The patient agreed with the plan and demonstrated an understanding of the instructions.  The patient was advised to call back if the symptoms worsen or if the condition fails to  improve as anticipated.   Lequita Asal, MD, PhD    11/29/2020, 10:38 AM  I, De Burrs, am acting as scribe for [employer]. Mike Gip, MD, PhD.  I, Patirica Longshore C. Mike Gip, MD, have reviewed the above documentation for accuracy and completeness, and I agree with the above.

## 2020-11-29 ENCOUNTER — Inpatient Hospital Stay: Payer: Medicare Other

## 2020-11-29 ENCOUNTER — Encounter: Payer: Self-pay | Admitting: Hematology and Oncology

## 2020-11-29 ENCOUNTER — Inpatient Hospital Stay: Payer: Medicare Other | Attending: Hematology and Oncology

## 2020-11-29 ENCOUNTER — Other Ambulatory Visit: Payer: Self-pay

## 2020-11-29 ENCOUNTER — Inpatient Hospital Stay (HOSPITAL_BASED_OUTPATIENT_CLINIC_OR_DEPARTMENT_OTHER): Payer: Medicare Other | Admitting: Hematology and Oncology

## 2020-11-29 VITALS — BP 153/66 | HR 87 | Temp 97.9°F | Wt 155.2 lb

## 2020-11-29 DIAGNOSIS — D509 Iron deficiency anemia, unspecified: Secondary | ICD-10-CM

## 2020-11-29 DIAGNOSIS — Z7982 Long term (current) use of aspirin: Secondary | ICD-10-CM | POA: Diagnosis not present

## 2020-11-29 DIAGNOSIS — N183 Chronic kidney disease, stage 3 unspecified: Secondary | ICD-10-CM | POA: Insufficient documentation

## 2020-11-29 DIAGNOSIS — E538 Deficiency of other specified B group vitamins: Secondary | ICD-10-CM | POA: Insufficient documentation

## 2020-11-29 DIAGNOSIS — E785 Hyperlipidemia, unspecified: Secondary | ICD-10-CM | POA: Insufficient documentation

## 2020-11-29 DIAGNOSIS — Z79899 Other long term (current) drug therapy: Secondary | ICD-10-CM | POA: Diagnosis not present

## 2020-11-29 DIAGNOSIS — E611 Iron deficiency: Secondary | ICD-10-CM | POA: Insufficient documentation

## 2020-11-29 DIAGNOSIS — Z87891 Personal history of nicotine dependence: Secondary | ICD-10-CM | POA: Diagnosis not present

## 2020-11-29 DIAGNOSIS — D631 Anemia in chronic kidney disease: Secondary | ICD-10-CM

## 2020-11-29 DIAGNOSIS — G4733 Obstructive sleep apnea (adult) (pediatric): Secondary | ICD-10-CM | POA: Insufficient documentation

## 2020-11-29 LAB — FERRITIN: Ferritin: 32 ng/mL (ref 11–307)

## 2020-11-29 LAB — IRON AND TIBC
Iron: 37 ug/dL (ref 28–170)
Saturation Ratios: 11 % (ref 10.4–31.8)
TIBC: 347 ug/dL (ref 250–450)
UIBC: 310 ug/dL

## 2020-11-29 LAB — CBC WITH DIFFERENTIAL/PLATELET
Abs Immature Granulocytes: 0.03 10*3/uL (ref 0.00–0.07)
Basophils Absolute: 0.1 10*3/uL (ref 0.0–0.1)
Basophils Relative: 1 %
Eosinophils Absolute: 0.1 10*3/uL (ref 0.0–0.5)
Eosinophils Relative: 2 %
HCT: 35.2 % — ABNORMAL LOW (ref 36.0–46.0)
Hemoglobin: 10.5 g/dL — ABNORMAL LOW (ref 12.0–15.0)
Immature Granulocytes: 0 %
Lymphocytes Relative: 16 %
Lymphs Abs: 1.5 10*3/uL (ref 0.7–4.0)
MCH: 24.9 pg — ABNORMAL LOW (ref 26.0–34.0)
MCHC: 29.8 g/dL — ABNORMAL LOW (ref 30.0–36.0)
MCV: 83.6 fL (ref 80.0–100.0)
Monocytes Absolute: 0.7 10*3/uL (ref 0.1–1.0)
Monocytes Relative: 8 %
Neutro Abs: 7 10*3/uL (ref 1.7–7.7)
Neutrophils Relative %: 73 %
Platelets: 176 10*3/uL (ref 150–400)
RBC: 4.21 MIL/uL (ref 3.87–5.11)
RDW: 14.9 % (ref 11.5–15.5)
WBC: 9.5 10*3/uL (ref 4.0–10.5)
nRBC: 0 % (ref 0.0–0.2)

## 2020-11-29 LAB — CREATININE, SERUM
Creatinine, Ser: 1.43 mg/dL — ABNORMAL HIGH (ref 0.44–1.00)
GFR, Estimated: 38 mL/min — ABNORMAL LOW (ref >60)

## 2020-11-29 NOTE — Patient Instructions (Signed)
  Continue oral iron

## 2020-12-06 DIAGNOSIS — J449 Chronic obstructive pulmonary disease, unspecified: Secondary | ICD-10-CM | POA: Diagnosis not present

## 2020-12-11 DIAGNOSIS — J449 Chronic obstructive pulmonary disease, unspecified: Secondary | ICD-10-CM | POA: Diagnosis not present

## 2020-12-18 DIAGNOSIS — G4733 Obstructive sleep apnea (adult) (pediatric): Secondary | ICD-10-CM | POA: Diagnosis not present

## 2020-12-27 DIAGNOSIS — G4733 Obstructive sleep apnea (adult) (pediatric): Secondary | ICD-10-CM | POA: Diagnosis not present

## 2021-01-01 ENCOUNTER — Telehealth: Payer: Self-pay

## 2021-01-01 ENCOUNTER — Telehealth: Payer: Self-pay | Admitting: General Practice

## 2021-01-01 NOTE — Telephone Encounter (Signed)
  Chronic Care Management   Outreach Note  01/01/2021 Name: Madison Franklin MRN: [id number] DOB: January 06, 1946  Referred by: Venita Lick, NP Reason for referral : Appointment (RNCM: Follow up for Chronic Disease Management and Care Coordination Needs )   An unsuccessful telephone outreach was attempted today. The patient was referred to the case management team for assistance with care management and care coordination.   Follow Up Plan: A HIPAA compliant phone message was left for the patient providing contact information and requesting a return call.   Noreene Larsson RN, MSN, [facility] Mobile: [phone]

## 2021-01-03 DIAGNOSIS — J449 Chronic obstructive pulmonary disease, unspecified: Secondary | ICD-10-CM | POA: Diagnosis not present

## 2021-01-08 DIAGNOSIS — J449 Chronic obstructive pulmonary disease, unspecified: Secondary | ICD-10-CM | POA: Diagnosis not present

## 2021-01-18 DIAGNOSIS — G4733 Obstructive sleep apnea (adult) (pediatric): Secondary | ICD-10-CM | POA: Diagnosis not present

## 2021-01-24 ENCOUNTER — Telehealth: Payer: Self-pay | Admitting: *Deleted

## 2021-01-24 NOTE — Chronic Care Management (AMB) (Signed)
  Care Management   Note  01/24/2021 Name: Madison Franklin MRN: [id number] DOB: 26-Apr-1946  Madison Franklin is a 75 y.o. year old female who is a primary care patient of Venita Lick, NP and is actively engaged with the care management team. I reached out to [facility] by phone today to assist with re-scheduling a follow up visit with the RN Case Manager  Follow up plan: Unsuccessful telephone outreach attempt made. A HIPAA compliant phone message was left for the patient providing contact information and requesting a return call.  The care management team will reach out to the patient again over the next 7 days.  If patient returns call to provider office, please advise to call [place] Lysle Morales at [facility]

## 2021-01-24 NOTE — Telephone Encounter (Signed)
Please reschedule missed f/u with RN CM [facility]

## 2021-01-30 NOTE — Chronic Care Management (AMB) (Signed)
  Care Management   Note  01/30/2021 Name: Madison Franklin MRN: [id number] DOB: 1946/09/18  Madison Franklin is a 75 y.o. year old female who is a primary care patient of Venita Lick, NP and is actively engaged with the care management team. I reached out to [facility] by phone today to assist with re-scheduling a follow up visit with the RN Case Manager  Follow up plan: Telephone appointment with care management team member scheduled for:02/27/2021   [facility]

## 2021-01-30 NOTE — Telephone Encounter (Signed)
Patient rescheduled for 02/27/2021

## 2021-02-03 DIAGNOSIS — J449 Chronic obstructive pulmonary disease, unspecified: Secondary | ICD-10-CM | POA: Diagnosis not present

## 2021-02-05 ENCOUNTER — Other Ambulatory Visit: Payer: Self-pay

## 2021-02-05 MED ORDER — ALBUTEROL SULFATE HFA 108 (90 BASE) MCG/ACT IN AERS: 2.0000 | INHALATION_SPRAY | Freq: Four times a day (QID) | RESPIRATORY_TRACT | 3 refills | Status: DC | PRN

## 2021-02-05 NOTE — Telephone Encounter (Signed)
Patient was last seen in office on 08/03/20 and patient is scheduled for in office appointment on 02/12/21.

## 2021-02-08 DIAGNOSIS — J449 Chronic obstructive pulmonary disease, unspecified: Secondary | ICD-10-CM | POA: Diagnosis not present

## 2021-02-12 ENCOUNTER — Ambulatory Visit: Payer: Medicare Other | Admitting: Nurse Practitioner

## 2021-02-17 DIAGNOSIS — G4733 Obstructive sleep apnea (adult) (pediatric): Secondary | ICD-10-CM | POA: Diagnosis not present

## 2021-02-26 ENCOUNTER — Other Ambulatory Visit: Payer: Self-pay

## 2021-02-26 ENCOUNTER — Inpatient Hospital Stay: Payer: Medicare Other

## 2021-02-26 ENCOUNTER — Inpatient Hospital Stay: Payer: Medicare Other | Attending: Nurse Practitioner

## 2021-02-26 DIAGNOSIS — D509 Iron deficiency anemia, unspecified: Secondary | ICD-10-CM | POA: Insufficient documentation

## 2021-02-26 LAB — CBC WITH DIFFERENTIAL/PLATELET
Abs Immature Granulocytes: 0.05 10*3/uL (ref 0.00–0.07)
Basophils Absolute: 0.1 10*3/uL (ref 0.0–0.1)
Basophils Relative: 1 %
Eosinophils Absolute: 0.2 10*3/uL (ref 0.0–0.5)
Eosinophils Relative: 2 %
HCT: 34.8 % — ABNORMAL LOW (ref 36.0–46.0)
Hemoglobin: 10.5 g/dL — ABNORMAL LOW (ref 12.0–15.0)
Immature Granulocytes: 1 %
Lymphocytes Relative: 17 %
Lymphs Abs: 1.6 10*3/uL (ref 0.7–4.0)
MCH: 25.1 pg — ABNORMAL LOW (ref 26.0–34.0)
MCHC: 30.2 g/dL (ref 30.0–36.0)
MCV: 83.3 fL (ref 80.0–100.0)
Monocytes Absolute: 0.9 10*3/uL (ref 0.1–1.0)
Monocytes Relative: 9 %
Neutro Abs: 6.6 10*3/uL (ref 1.7–7.7)
Neutrophils Relative %: 70 %
Platelets: 159 10*3/uL (ref 150–400)
RBC: 4.18 MIL/uL (ref 3.87–5.11)
RDW: 15 % (ref 11.5–15.5)
WBC: 9.4 10*3/uL (ref 4.0–10.5)
nRBC: 0 % (ref 0.0–0.2)

## 2021-02-26 LAB — FERRITIN: Ferritin: 27 ng/mL (ref 11–307)

## 2021-02-27 ENCOUNTER — Telehealth: Payer: Medicare Other

## 2021-02-27 ENCOUNTER — Telehealth: Payer: Self-pay | Admitting: General Practice

## 2021-02-27 NOTE — Telephone Encounter (Signed)
  Chronic Care Management   Outreach Note  02/27/2021 Name: Madison Franklin MRN: [id number] DOB: 08-21-1946  Referred by: Venita Lick, NP Reason for referral : Chronic Care Management (RNCM: Follow up for Chronic Disease Management and Care Coordination needs- 2nd attempt)   A second unsuccessful telephone outreach was attempted today. The patient was referred to the case management team for assistance with care management and care coordination.   Follow Up Plan: A HIPAA compliant phone message was left for the patient providing contact information and requesting a return call.   Noreene Larsson RN, MSN, [facility] Mobile: [phone]

## 2021-03-05 DIAGNOSIS — J449 Chronic obstructive pulmonary disease, unspecified: Secondary | ICD-10-CM | POA: Diagnosis not present

## 2021-03-10 DIAGNOSIS — J449 Chronic obstructive pulmonary disease, unspecified: Secondary | ICD-10-CM | POA: Diagnosis not present

## 2021-03-14 ENCOUNTER — Telehealth: Payer: Self-pay | Admitting: Nurse Practitioner

## 2021-03-14 NOTE — Telephone Encounter (Signed)
Patient has been rescheduled.

## 2021-03-14 NOTE — Telephone Encounter (Signed)
Please schedule office visit so we can recheck on our machine, thank you.

## 2021-03-14 NOTE — Telephone Encounter (Signed)
Sarah with [facility] called saying she as seeing the patient today A1C was 6.7.  She told the patient she would call the office and have thenm contact the patient.

## 2021-03-15 NOTE — Telephone Encounter (Signed)
Scheduled 6/1

## 2021-03-19 NOTE — Progress Notes (Signed)
@Patient  ID: Madison Franklin, female    DOB: 1946-03-07, 75 y.o.   MRN: [id number]  Chief Complaint  Patient presents with  . Follow-up    Referring provider: Venita Lick, NP  HPI: 75 year old female, former smoker quit in 2018 (15-pack-year history).  Past medical history significant for, moderate-severe restrictive pulmonary disease due to severe kyphoscoliosis, severe pulmonary hypertension, nodule left upper lobe lung.  Patient of Dr. Patsey Berthold, last seen in office on 09/17/2020.   Previous [facility] pulmonary encounter: 09/17/20- Dr. Valerie Roys presents for follow-up today.  Last seen09/15/2021byme. No worsening of symptoms in the interval. Sleep study performed in October obstructive sleep apnea noted. She has yet to receive her CPAP.  She is due for an appointment on the 30th for fitting of her CPAP.  She has been doing well using oxygen 24/7.  Tolerating the portable concentrator well.  She is compliant with her oxygen therapy.  She remains with poor insight as to the severity of her issues.  She does not endorse any fevers, chills or sweats.  Since starting oxygen therapy, sleeps well.  No dyspnea as long as she is using her oxygen.  Voices no active complaint today.  03/20/2021 Patient presents today for overdue 3 month follow-up.  She had an echocardiogram in August 2021 that showed grade 1 DD and severely elevated pulmonary artery pressures. During last visit she was advised to start CPAP. Reports that she has been wearing CPAP every night, no download available. She has no acute respiratory complaints today. She is not currently using Spiriva, needs refill. She has been using albuterol rescue inhaler a couple of times a day. She gets winded quickly with exertion. She has been compliant with oxygen use. Her husband states that they have mold in the kitchen. CAT score 10. Denies cough, chest congestion, chest tightness.    DATA: CT chest 03/13/17: Severe thoracic kyphoscoliosis. 4  mm nodule in left upper lobe (decreased in size from previously). Minimal emphysema Echocardiogram 05/28/17:LVEF 60-65%. Grade 1 diastolic dysfunction. No significant valvular disease noted. No evidence of right-sided overload. RHC 07/14/17:Mild to moderate pulmonary hypertension (mean PAP 34 mmHg, PVR 5.7 Wood units) PFTs 10/01/17:FVC: 1.28 L (49 %pred), FEV1: 0.83 >0.91 L (41 >45 %pred), FEV1/FVC: 65%; lung volume measurements invalid, DLCO 6.6 (31 %pred) ONO 10/26/17: Minimal brief desaturation. Does not require nocturnal O2 LDCT chest 06/01/18:Lung-RADS 2, benign appearance or behavior. Aortic atherosclerosis, coronary artery atherosclerosis and emphysema. Pulmonary artery enlargement suggests pulmonary arterial hypertension. Gallbladder wall calcifications indicative of "porcelain gallbladder". Very large thyroid gland extending into upper mediastinum. 2D echo 06/12/2020:LVEF 60 to 65%. Grade 1 diastolic dysfunction. Elevated left atrial pressure. Increased size of right ventricle.Severely elevated pulmonary artery systolic pressure. Estimated right ventricular systolic pressure 95.0 mmHg. PFTs 06/12/2020: Severe restriction, element of obstruction also evident by flow volume loop. Diffusion capacity impaired moderately. CXR 07/04/20- Bronchitic changes at the bases. No consolidation or pleural effusion. Stable cardiomediastinal silhouette. Marked scoliosis of the spine. No pneumothorax.  Allergies  Allergen Reactions  . Latex Itching and Rash    With latex gloves (when worn)    Immunization History  Administered Date(s) Administered  . Fluad Quad(high Dose 65+) 08/03/2020  . Influenza, High Dose Seasonal PF 09/03/2016  . Influenza,inj,Quad PF,6+ Mos 07/27/2015  . Influenza,inj,quad, With Preservative 08/24/2018, 07/15/2019  . Influenza-Unspecified 08/14/2017, 08/24/2018  . Moderna Sars-Covid-2 Vaccination 10/18/2020  . PFIZER(Purple Top)SARS-COV-2 Vaccination  12/15/2019, 01/11/2020  . Pneumococcal Conjugate-13 01/12/2015  . Pneumococcal Polysaccharide-23 07/01/2011  . Td  07/27/2015    Past Medical History:  Diagnosis Date  . COPD (chronic obstructive pulmonary disease) ([place])   . Dyspnea   . Hearing aid worn    bilateral  . Hyperlipidemia   . Hypertension   . Osteoporosis   . Personal history of tobacco use, presenting hazards to health 08/15/2015  . Pulmonary hypertension, mild ([facility])    mild to moderate    Tobacco History: Social History   Tobacco Use  Smoking Status Former Smoker  . Packs/day: 0.50  . Years: 30.00  . Pack years: 15.00  . Types: Cigarettes  . Quit date: 01/17/2017  . Years since quitting: 4.1  Smokeless Tobacco Never Used   Counseling given: Not Answered   Outpatient Medications Prior to Visit  Medication Sig Dispense Refill  . acetaminophen (TYLENOL) 325 MG tablet Take 325 mg by mouth every 6 (six) hours as needed (for pain.).    Marland Kitchen albuterol (VENTOLIN HFA) 108 (90 Base) MCG/ACT inhaler Inhale 2 puffs into the lungs every 6 (six) hours as needed for wheezing or shortness of breath. 16 g 3  . amLODipine (NORVASC) 5 MG tablet Take 1 tablet (5 mg total) by mouth daily. 90 tablet 4  . aspirin EC 81 MG tablet Take 81 mg by mouth daily.    Marland Kitchen atorvastatin (LIPITOR) 20 MG tablet Take 1 tablet (20 mg total) by mouth daily. 90 tablet 3  . hydrochlorothiazide (MICROZIDE) 12.5 MG capsule Take 1 capsule (12.5 mg total) by mouth daily. 90 capsule 0  . lisinopril (ZESTRIL) 40 MG tablet Take 1 tablet (40 mg total) by mouth daily. 90 tablet 4  . Tiotropium Bromide-Olodaterol 2.5-2.5 MCG/ACT AERS Inhale 2 puffs into the lungs daily. 4 g 6  . vitamin B-12 (CYANOCOBALAMIN) 1000 MCG tablet Take 1,000 mcg by mouth daily.    . Vitamin D, Cholecalciferol, 1000 units TABS Take 1 tablet by mouth daily.     No facility-administered medications prior to visit.    Review of Systems  Review of Systems  Constitutional: Negative.    HENT: Negative.   Respiratory: Positive for shortness of breath. Negative for cough, chest tightness and wheezing.   Cardiovascular: Negative.    Physical Exam  BP (!) 156/72 (BP Location: Left Arm, Patient Position: Sitting, Cuff Size: Normal)   Pulse 83   Temp (!) 97.3 F (36.3 C) (Temporal)   Ht 5\' 1"  (1.549 m)   Wt 161 lb 3.2 oz (73.1 kg)   LMP  (LMP Unknown)   SpO2 96%   BMI 30.46 kg/m  Physical Exam Constitutional:      Appearance: Normal appearance.  Cardiovascular:     Rate and Rhythm: Normal rate and regular rhythm.  Pulmonary:     Effort: Pulmonary effort is normal.     Breath sounds: Normal breath sounds.  Neurological:     Mental Status: She is alert.  Psychiatric:        Mood and Affect: Mood normal.        Behavior: Behavior normal.        Thought Content: Thought content normal.        Judgment: Judgment normal.      Lab Results:  CBC    Component Value Date/Time   WBC 9.4 02/26/2021 1116   RBC 4.18 02/26/2021 1116   HGB 10.5 (L) 02/26/2021 1116   HGB 10.8 (L) 11/24/2017 1033   HCT 34.8 (L) 02/26/2021 1116   HCT 34.6 11/24/2017 1033   PLT 159 02/26/2021 1116  PLT 193 11/24/2017 1033   MCV 83.3 02/26/2021 1116   MCV 80 11/24/2017 1033   MCH 25.1 (L) 02/26/2021 1116   MCHC 30.2 02/26/2021 1116   RDW 15.0 02/26/2021 1116   RDW 15.6 (H) 11/24/2017 1033   LYMPHSABS 1.6 02/26/2021 1116   LYMPHSABS 1.6 11/24/2017 1033   MONOABS 0.9 02/26/2021 1116   EOSABS 0.2 02/26/2021 1116   EOSABS 0.2 11/24/2017 1033   BASOSABS 0.1 02/26/2021 1116   BASOSABS 0.1 11/24/2017 1033    BMET    Component Value Date/Time   NA 139 04/20/2020 1419   K 4.9 04/20/2020 1419   CL 105 04/20/2020 1419   CO2 18 (L) 04/20/2020 1419   GLUCOSE 80 04/20/2020 1419   GLUCOSE 144 (H) 11/30/2019 0811   BUN 37 (H) 04/20/2020 1419   CREATININE 1.43 (H) 11/29/2020 0950   CALCIUM 9.7 04/20/2020 1419   GFRNONAA 38 (L) 11/29/2020 0950   GFRAA 41 (L) 05/29/2020 0923     BNP No results found for: BNP  ProBNP No results found for: PROBNP  Imaging: No results found.   Assessment & Plan:   COPD, severe ([place]) - No acute respiratory complaints. She experiences dyspnea with minimal exertion. Not currently using Spiriva, using SABA 2-3 times a day. Recommend resuming Spiriva Respimat 2.27mcg daily. Denies cough, chest congestion or chest tightness. FU in 3 months.   Mold exposure - Checking mold and aspergillus panel   Chronic respiratory failure with hypoxia ([place]) - Patient is compliant with oxygen and continues to benefit from use   OSA (obstructive sleep apnea) - Pateint was started back on CPAP during last visit in November 2021. She reports compliant with use, no download available. We will called her DME company to see if we can get her added to airview. Continue to encourage patient wear CPAP every night for min 4-6 hours or longer.    Martyn Ehrich, NP 03/20/2021

## 2021-03-20 ENCOUNTER — Ambulatory Visit (INDEPENDENT_AMBULATORY_CARE_PROVIDER_SITE_OTHER): Payer: Medicare Other | Admitting: Primary Care

## 2021-03-20 ENCOUNTER — Other Ambulatory Visit
Admission: RE | Admit: 2021-03-20 | Discharge: 2021-03-20 | Disposition: A | Discharge status: Home / Self Care | Payer: Medicare Other | Attending: Primary Care | Admitting: Primary Care

## 2021-03-20 ENCOUNTER — Ambulatory Visit (INDEPENDENT_AMBULATORY_CARE_PROVIDER_SITE_OTHER): Payer: Medicare Other | Admitting: Nurse Practitioner

## 2021-03-20 ENCOUNTER — Telehealth: Payer: Self-pay

## 2021-03-20 ENCOUNTER — Encounter: Payer: Self-pay | Admitting: Nurse Practitioner

## 2021-03-20 ENCOUNTER — Encounter: Payer: Self-pay | Admitting: Primary Care

## 2021-03-20 ENCOUNTER — Other Ambulatory Visit: Payer: Self-pay

## 2021-03-20 VITALS — BP 156/72 | HR 83 | Temp 97.3°F | Ht 61.0 in | Wt 161.2 lb

## 2021-03-20 VITALS — BP 130/64 | HR 91 | Temp 98.8°F | Ht 61.0 in | Wt 160.4 lb

## 2021-03-20 DIAGNOSIS — G4733 Obstructive sleep apnea (adult) (pediatric): Secondary | ICD-10-CM

## 2021-03-20 DIAGNOSIS — I700 Atherosclerosis of aorta: Secondary | ICD-10-CM

## 2021-03-20 DIAGNOSIS — R011 Cardiac murmur, unspecified: Secondary | ICD-10-CM | POA: Diagnosis not present

## 2021-03-20 DIAGNOSIS — R7301 Impaired fasting glucose: Secondary | ICD-10-CM

## 2021-03-20 DIAGNOSIS — J449 Chronic obstructive pulmonary disease, unspecified: Secondary | ICD-10-CM | POA: Diagnosis not present

## 2021-03-20 DIAGNOSIS — D509 Iron deficiency anemia, unspecified: Secondary | ICD-10-CM | POA: Insufficient documentation

## 2021-03-20 DIAGNOSIS — R0602 Shortness of breath: Secondary | ICD-10-CM

## 2021-03-20 DIAGNOSIS — Z77120 Contact with and (suspected) exposure to mold (toxic): Secondary | ICD-10-CM | POA: Insufficient documentation

## 2021-03-20 DIAGNOSIS — N1831 Chronic kidney disease, stage 3a: Secondary | ICD-10-CM

## 2021-03-20 DIAGNOSIS — J9611 Chronic respiratory failure with hypoxia: Secondary | ICD-10-CM | POA: Diagnosis not present

## 2021-03-20 DIAGNOSIS — E782 Mixed hyperlipidemia: Secondary | ICD-10-CM

## 2021-03-20 DIAGNOSIS — I10 Essential (primary) hypertension: Secondary | ICD-10-CM

## 2021-03-20 DIAGNOSIS — D631 Anemia in chronic kidney disease: Secondary | ICD-10-CM | POA: Diagnosis not present

## 2021-03-20 DIAGNOSIS — I2721 Secondary pulmonary arterial hypertension: Secondary | ICD-10-CM | POA: Diagnosis not present

## 2021-03-20 DIAGNOSIS — N1832 Chronic kidney disease, stage 3b: Secondary | ICD-10-CM

## 2021-03-20 LAB — CBC WITH DIFFERENTIAL/PLATELET
Abs Immature Granulocytes: 0.03 10*3/uL (ref 0.00–0.07)
Basophils Absolute: 0.1 10*3/uL (ref 0.0–0.1)
Basophils Relative: 1 %
Eosinophils Absolute: 0.1 10*3/uL (ref 0.0–0.5)
Eosinophils Relative: 1 %
HCT: 36.1 % (ref 36.0–46.0)
Hemoglobin: 10.7 g/dL — ABNORMAL LOW (ref 12.0–15.0)
Immature Granulocytes: 0 %
Lymphocytes Relative: 11 %
Lymphs Abs: 1 10*3/uL (ref 0.7–4.0)
MCH: 25.1 pg — ABNORMAL LOW (ref 26.0–34.0)
MCHC: 29.6 g/dL — ABNORMAL LOW (ref 30.0–36.0)
MCV: 84.7 fL (ref 80.0–100.0)
Monocytes Absolute: 0.6 10*3/uL (ref 0.1–1.0)
Monocytes Relative: 7 %
Neutro Abs: 7.1 10*3/uL (ref 1.7–7.7)
Neutrophils Relative %: 80 %
Platelets: 172 10*3/uL (ref 150–400)
RBC: 4.26 MIL/uL (ref 3.87–5.11)
RDW: 14.5 % (ref 11.5–15.5)
WBC: 8.9 10*3/uL (ref 4.0–10.5)
nRBC: 0 % (ref 0.0–0.2)

## 2021-03-20 LAB — IRON AND TIBC
Iron: 40 ug/dL (ref 28–170)
Saturation Ratios: 11 % (ref 10.4–31.8)
TIBC: 368 ug/dL (ref 250–450)
UIBC: 328 ug/dL

## 2021-03-20 LAB — MICROALBUMIN, URINE WAIVED
Creatinine, Urine Waived: 200 mg/dL (ref 10–300)
Microalb, Ur Waived: 30 mg/L — ABNORMAL HIGH (ref 0–19)
Microalb/Creat Ratio: <30 mg/g (ref <30)

## 2021-03-20 LAB — BAYER DCA HB A1C WAIVED: HB A1C (BAYER DCA - WAIVED): 5.2 % (ref <7.0)

## 2021-03-20 MED ORDER — SPIRIVA RESPIMAT 2.5 MCG/ACT IN AERS: 2.0000 | INHALATION_SPRAY | Freq: Every day | RESPIRATORY_TRACT | 0 refills | Status: DC

## 2021-03-20 MED ORDER — HYDROCHLOROTHIAZIDE 12.5 MG PO CAPS: 12.5000 mg | ORAL_CAPSULE | Freq: Every day | ORAL | 4 refills | Status: DC

## 2021-03-20 NOTE — Assessment & Plan Note (Signed)
-   Patient is compliant with oxygen and continues to benefit from use

## 2021-03-20 NOTE — Assessment & Plan Note (Addendum)
-   No acute respiratory complaints. She experiences dyspnea with minimal exertion. Not currently using Spiriva, using SABA 2-3 times a day. Recommend resuming Spiriva Respimat 2.7mcg daily. Denies cough, chest congestion or chest tightness. FU in 3 months.

## 2021-03-20 NOTE — Assessment & Plan Note (Signed)
-   Checking mold and aspergillus panel

## 2021-03-20 NOTE — Assessment & Plan Note (Signed)
Chronic, ongoing with BP initially elevated today, but repeat at goal.  Continue current medication regimen and adjust as needed.  Will continue Lisinopril for kidney protection, consider change to Losartan next visit due to underlying COPD.  Recommend continue to monitor BP at home daily and focus on DASH diet.  Will check CMP and TSH today.  Return in 6 months.

## 2021-03-20 NOTE — Assessment & Plan Note (Signed)
Noted on exam, continue to monitor, return to cardiology as needed.   

## 2021-03-20 NOTE — Assessment & Plan Note (Addendum)
-   Pateint was started back on CPAP during last visit in November 2021. She reports compliant with use, no download available. We will called her DME company to see if we can get her added to airview. Continue to encourage patient wear CPAP every night for min 4-6 hours or longer.

## 2021-03-20 NOTE — Assessment & Plan Note (Signed)
Chronic, ongoing.  Continue collaboration with pulmonary and current inhaler regimen.  Recommend she scheduled annual lung CT scan for follow-up and provided husband number to call last visit, they have not called, will place new referral for this and discussed with them.  Return in 6 months for follow-up, sooner if worsening symptoms.

## 2021-03-20 NOTE — Telephone Encounter (Signed)
Thank you. Please place order for patient to be supplied with SD card, she will need to bring in machine or card with her to next apt for compliance check

## 2021-03-20 NOTE — Patient Instructions (Signed)
Chronic Obstructive Pulmonary Disease  Chronic obstructive pulmonary disease (COPD) is a long-term (chronic) lung problem. When you have COPD, it is hard for air to get in and out of your lungs. Usually the condition gets worse over time, and your lungs will never return to normal. There are things you can do to keep yourself as healthy as possible. What are the causes?  Smoking. This is the most common cause.  Certain genes passed from parent to child (inherited). What increases the risk?  Being exposed to secondhand smoke from cigarettes, pipes, or cigars.  Being exposed to chemicals and other irritants, such as fumes and dust in the work environment.  Having chronic lung conditions or infections. What are the signs or symptoms?  Shortness of breath, especially during physical activity.  A long-term cough with a large amount of thick mucus. Sometimes, the cough may not have any mucus (dry cough).  Wheezing.  Breathing quickly.  Skin that looks gray or blue, especially in the fingers, toes, or lips.  Feeling tired (fatigue).  Weight loss.  Chest tightness.  Having infections often.  Episodes when breathing symptoms become much worse (exacerbations). At the later stages of this disease, you may have swelling in the ankles, feet, or legs. How is this treated?  Taking medicines.  Quitting smoking, if you smoke.  Rehabilitation. This includes steps to make your body work better. It may involve a team of specialists.  Doing exercises.  Making changes to your diet.  Using oxygen.  Lung surgery.  Lung transplant.  Comfort measures (palliative care). Follow these instructions at home: Medicines  Take over-the-counter and prescription medicines only as told by your doctor.  Talk to your doctor before taking any cough or allergy medicines. You may need to avoid medicines that cause your lungs to be dry. Lifestyle  If you smoke, stop smoking. Smoking makes the  problem worse.  Do not smoke or use any products that contain nicotine or tobacco. If you need help quitting, ask your doctor.  Avoid being around things that make your breathing worse. This may include smoke, chemicals, and fumes.  Stay active, but remember to rest as well.  Learn and use tips on how to manage stress and control your breathing.  Make sure you get enough sleep. Most adults need at least 7 hours of sleep every night.  Eat healthy foods. Eat smaller meals more often. Rest before meals. Controlled breathing Learn and use tips on how to control your breathing as told by your doctor. Try:  Breathing in (inhaling) through your nose for 1 second. Then, pucker your lips and breath out (exhale) through your lips for 2 seconds.  Putting one hand on your belly (abdomen). Breathe in slowly through your nose for 1 second. Your hand on your belly should move out. Pucker your lips and breathe out slowly through your lips. Your hand on your belly should move in as you breathe out.   Controlled coughing Learn and use controlled coughing to clear mucus from your lungs. Follow these steps: 1. Lean your head a little forward. 2. Breathe in deeply. 3. Try to hold your breath for 3 seconds. 4. Keep your mouth slightly open while coughing 2 times. 5. Spit any mucus out into a tissue. 6. Rest and do the steps again 1 or 2 times as needed. General instructions  Make sure you get all the shots (vaccines) that your doctor recommends. Ask your doctor about a flu shot and a pneumonia shot.    Use oxygen therapy and pulmonary rehabilitation if told by your doctor. If you need home oxygen therapy, ask your doctor if you should buy a tool to measure your oxygen level (oximeter).  Make a COPD action plan with your doctor. This helps you to know what to do if you feel worse than usual.  Manage any other conditions you have as told by your doctor.  Avoid going outside when it is very hot, cold, or  humid.  Avoid people who have a sickness you can catch (contagious).  Keep all follow-up visits. Contact a doctor if:  You cough up more mucus than usual.  There is a change in the color or thickness of the mucus.  It is harder to breathe than usual.  Your breathing is faster than usual.  You have trouble sleeping.  You need to use your medicines more often than usual.  You have trouble doing your normal activities such as getting dressed or walking around the house. Get help right away if:  You have shortness of breath while resting.  You have shortness of breath that stops you from: ? Being able to talk. ? Doing normal activities.  Your chest hurts for longer than 5 minutes.  Your skin color is more blue than usual.  Your pulse oximeter shows that you have low oxygen for longer than 5 minutes.  You have a fever.  You feel too tired to breathe normally. These symptoms may represent a serious problem that is an emergency. Do not wait to see if the symptoms will go away. Get medical help right away. Call your local emergency services (911 in the [place] Do not drive yourself to the hospital. Summary  Chronic obstructive pulmonary disease (COPD) is a long-term lung problem.  The way your lungs work will never return to normal. Usually the condition gets worse over time. There are things you can do to keep yourself as healthy as possible.  Take over-the-counter and prescription medicines only as told by your doctor.  If you smoke, stop. Smoking makes the problem worse. This information is not intended to replace advice given to you by your health care provider. Make sure you discuss any questions you have with your health care provider. Document Revised: 08/14/2020 Document Reviewed: 08/14/2020 Elsevier Patient Education  2021 [employer] Inc.   

## 2021-03-20 NOTE — Assessment & Plan Note (Signed)
A1c reported as 6.7% with [facility] NP.  Today in office A1c 5.2% and urine ALB 30, educated patient and significant other on results.

## 2021-03-20 NOTE — Assessment & Plan Note (Signed)
Chronic, ongoing.  Continue current medication regimen and adjust as needed. Lipid panel today. 

## 2021-03-20 NOTE — Telephone Encounter (Signed)
contacted [employer] and due to the 3G and 4G towers they were unable to retrieve the compliance report, recommend having patient get an SD card from [employer] or [place].

## 2021-03-20 NOTE — Patient Instructions (Addendum)
Recommendations: - Resume Spiriva 2 puffs once daily in morning - Continue to wear CPAP every night for 4-6 hours or longer   Labs: - Mold panel and aspergillus   Follow-up: - 3 months with Dr. Patsey Berthold

## 2021-03-20 NOTE — Telephone Encounter (Signed)
Lm for patient.  

## 2021-03-20 NOTE — Progress Notes (Signed)
BP 130/64 (BP Location: Left Arm)   Pulse 91   Temp 98.8 F (37.1 C) (Oral)   Ht 5\' 1"  (1.549 m)   Wt 160 lb 6.4 oz (72.8 kg)   LMP  (LMP Unknown)   SpO2 96%   BMI 30.31 kg/m    Subjective:    Patient ID: Madison Franklin, female    DOB: Mar 17, 1946, 75 y.o.   MRN: [id number]  HPI: Madison Franklin is a 75 y.o. female  Chief Complaint  Patient presents with  . Diabetes  . Hyperlipidemia  . Hypertension   Husband present at bedside to assist with HPI.  HYPERTENSION / HYPERLIPIDEMIA Continues on Lisinopril, Amlodipine, and HCTZ + Atorvastatin.  [facility] NP recently at her house and notified PCP that A1c done showed at 6.7%. Satisfied with current treatment? yes Duration of hypertension: chronic BP monitoring frequency: daily BP range: 130/60-70 range BP medication side effects: no Duration of hyperlipidemia: chronic Cholesterol medication side effects: no Cholesterol supplements: none Medication compliance: good compliance Aspirin: yes Recent stressors: no Recurrent headaches: no Visual changes: no Palpitations: no Dyspnea: improving Chest pain: no Lower extremity edema: no Dizzy/lightheaded: no   COPD Quit smoking over 4 years ago.  Has restarted with pulmonary and last saw today, they did some blood work.  Due to her severe pulmonary hypertension they are starting O2 supplementation 2L [place] when exerting and at bedtime -- wears mask at bedtime.  Has not gone for yearly lung CT and they have been trying to contact her to follow-up, has lung nodules to monitor -- last 2019.  She reports the oxygen is helping a whole lot.    Currently inhaler regimen: Stiolto and Albuterol. COPD status: stable Satisfied with current treatment?: yes Oxygen use: yes Dyspnea frequency: at baseline Cough frequency: occasional Rescue inhaler frequency:  once a day Limitation of activity: at times Productive cough: none Last Spirometry: with pulmonary Pneumovax: Up to Date Influenza: Up to Date    CHRONIC KIDNEY DISEASE Last February labs showed GFR 38  and CRT 1.43.  Has remained in this range for over a year.  Sees nephrology on 08/16/20 -- was to return in February. CKD status: stable Medications renally dose: yes Previous renal evaluation: no Pneumovax:  Up to Date Influenza Vaccine:  Up to Date   ANEMIA Followed by hematology and last seen today, with labs -- continues on oral B12 and iron.   Anemia status: stable Etiology of anemia: Duration of anemia treatment:  Compliance with treatment: good compliance Iron supplementation side effects: no Severity of anemia: mild Fatigue: no Decreased exercise tolerance: at times  Dyspnea on exertion: no Palpitations: no Bleeding: no Pica: no  Relevant past medical, surgical, family and social history reviewed and updated as indicated. Interim medical history since our last visit reviewed. Allergies and medications reviewed and updated.  Review of Systems  Constitutional: Negative for activity change, appetite change, diaphoresis, fatigue and fever.  Respiratory: Positive for shortness of breath (improving) and wheezing (improving). Negative for cough and chest tightness.   Cardiovascular: Negative for chest pain, palpitations and leg swelling.  Gastrointestinal: Negative.   Neurological: Negative.   Psychiatric/Behavioral: Negative.     Per HPI unless specifically indicated above     Objective:    BP 130/64 (BP Location: Left Arm)   Pulse 91   Temp 98.8 F (37.1 C) (Oral)   Ht 5\' 1"  (1.549 m)   Wt 160 lb 6.4 oz (72.8 kg)   LMP  (LMP  Unknown)   SpO2 96%   BMI 30.31 kg/m   Wt Readings from Last 3 Encounters:  03/20/21 160 lb 6.4 oz (72.8 kg)  03/20/21 161 lb 3.2 oz (73.1 kg)  11/29/20 155 lb 3.3 oz (70.4 kg)    Physical Exam Vitals and nursing note reviewed.  Constitutional:      General: She is awake. She is not in acute distress.    Appearance: She is well-developed, well-groomed and overweight. She  is not ill-appearing.  HENT:     Head: Normocephalic.     Right Ear: Hearing normal.     Left Ear: Hearing normal.  Eyes:     General: Lids are normal.        Right eye: No discharge.        Left eye: No discharge.     Conjunctiva/sclera: Conjunctivae normal.     Pupils: Pupils are equal, round, and reactive to light.  Neck:     Thyroid: No thyromegaly.     Vascular: No carotid bruit.  Cardiovascular:     Rate and Rhythm: Normal rate and regular rhythm.     Heart sounds: Murmur heard.   Systolic murmur is present with a grade of 3/6. No gallop.   Pulmonary:     Effort: Pulmonary effort is normal.     Breath sounds: Decreased breath sounds present.     Comments: Clear throughout, but diminished breath sounds throughout.  No wheezing noted on exam today, no rhonchi and no cough present.  No SOB with talking noted. Abdominal:     General: Bowel sounds are normal.     Palpations: Abdomen is soft.  Musculoskeletal:     Cervical back: Normal range of motion and neck supple.     Right lower leg: No edema.     Left lower leg: No edema.  Lymphadenopathy:     Cervical: No cervical adenopathy.  Skin:    General: Skin is warm and dry.  Neurological:     Mental Status: She is alert and oriented to person, place, and time.  Psychiatric:        Attention and Perception: Attention normal.        Mood and Affect: Mood normal.        Speech: Speech normal.        Behavior: Behavior normal. Behavior is cooperative.        Thought Content: Thought content normal.     Results for orders placed or performed during the hospital encounter of 03/20/21  Iron and TIBC  Result Value Ref Range   Iron 40 28 - 170 ug/dL   TIBC 368 250 - 450 ug/dL   Saturation Ratios 11 10.4 - 31.8 %   UIBC 328 ug/dL  CBC with Differential/Platelet  Result Value Ref Range   WBC 8.9 4.0 - 10.5 K/uL   RBC 4.26 3.87 - 5.11 MIL/uL   Hemoglobin 10.7 (L) 12.0 - 15.0 g/dL   HCT 36.1 36.0 - 46.0 %   MCV 84.7 80.0 -  100.0 fL   MCH 25.1 (L) 26.0 - 34.0 pg   MCHC 29.6 (L) 30.0 - 36.0 g/dL   RDW 14.5 11.5 - 15.5 %   Platelets 172 150 - 400 K/uL   nRBC 0.0 0.0 - 0.2 %   Neutrophils Relative % 80 %   Neutro Abs 7.1 1.7 - 7.7 K/uL   Lymphocytes Relative 11 %   Lymphs Abs 1.0 0.7 - 4.0 K/uL   Monocytes Relative 7 %  Monocytes Absolute 0.6 0.1 - 1.0 K/uL   Eosinophils Relative 1 %   Eosinophils Absolute 0.1 0.0 - 0.5 K/uL   Basophils Relative 1 %   Basophils Absolute 0.1 0.0 - 0.1 K/uL   Immature Granulocytes 0 %   Abs Immature Granulocytes 0.03 0.00 - 0.07 K/uL      Assessment & Plan:   Problem List Items Addressed This Visit      Cardiovascular and Mediastinum   Essential hypertension    Chronic, ongoing with BP initially elevated today, but repeat at goal.  Continue current medication regimen and adjust as needed.  Will continue Lisinopril for kidney protection, consider change to Losartan next visit due to underlying COPD.  Recommend continue to monitor BP at home daily and focus on DASH diet.  Will check CMP and TSH today.  Return in 6 months.      Relevant Medications   hydrochlorothiazide (MICROZIDE) 12.5 MG capsule   Pulmonary arterial hypertension (HCC) - Primary    Chronic, ongoing.  Continue collaboration with pulmonary.  Continue current inhaler regimen and adjust as needed.      Relevant Medications   hydrochlorothiazide (MICROZIDE) 12.5 MG capsule   Other Relevant Orders   Ambulatory Referral for Lung Cancer Scre   Aortic atherosclerosis ([place])    Noted on CT 07/25/2018.  Continue daily statin and ASA.  Continue cessation of smoking.        Relevant Medications   hydrochlorothiazide (MICROZIDE) 12.5 MG capsule     Respiratory   COPD, severe (HCC)    Chronic, ongoing.  Continue collaboration with pulmonary and current inhaler regimen.  Recommend she scheduled annual lung CT scan for follow-up and provided husband number to call last visit, they have not called, will place new  referral for this and discussed with them.  Return in 6 months for follow-up, sooner if worsening symptoms.      Relevant Orders   Ambulatory Referral for Lung Cancer Scre   Chronic respiratory failure with hypoxia ([place])    Followed by pulmonary and started on O2 supplementation on 07/04/20, which she reports has been beneficial -- will continue this, uses 2 L at baseline.        Endocrine   IFG (impaired fasting glucose)    A1c reported as 6.7% with [facility] NP.  Today in office A1c 5.2% and urine ALB 30, educated patient and significant other on results.      Relevant Orders   Bayer DCA Hb A1c Waived   TSH   Microalbumin, Urine Waived     Genitourinary   Chronic kidney disease, stage 3 (HCC)    Chronic, ongoing.  Will continue Lisinopril for kidney protection, consider change to Losartan next visit due to underlying COPD.  CMP today.  Is to return to nephrology, they report they will call them tomorrow.      Relevant Orders   TSH     Other   Hyperlipidemia    Chronic, ongoing.  Continue current medication regimen and adjust as needed.  Lipid panel today.      Relevant Medications   hydrochlorothiazide (MICROZIDE) 12.5 MG capsule   Other Relevant Orders   Comprehensive metabolic panel   Heart murmur    Noted on exam, continue to monitor, return to cardiology as needed.      Anemia in chronic kidney disease    Continue collaboration with hematology, recent note reviewed.          Follow up plan: Return in about  6 months (around 09/19/2021) for COPD, HTN/HLD, CKD, ANEMIA.

## 2021-03-20 NOTE — Assessment & Plan Note (Addendum)
Followed by pulmonary and started on O2 supplementation on 07/04/20, which she reports has been beneficial -- will continue this, uses 2 L at baseline.

## 2021-03-20 NOTE — Assessment & Plan Note (Signed)
Chronic, ongoing.  Will continue Lisinopril for kidney protection, consider change to Losartan next visit due to underlying COPD.  CMP today.  Is to return to nephrology, they report they will call them tomorrow.

## 2021-03-20 NOTE — Assessment & Plan Note (Signed)
Continue collaboration with hematology, recent note reviewed.

## 2021-03-20 NOTE — Assessment & Plan Note (Signed)
Noted on CT 07/25/2018.  Continue daily statin and ASA.  Continue cessation of smoking.

## 2021-03-20 NOTE — Assessment & Plan Note (Signed)
Chronic, ongoing.  Continue collaboration with pulmonary.  Continue current inhaler regimen and adjust as needed.

## 2021-03-21 LAB — TSH: TSH: 0.623 u[IU]/mL (ref 0.450–4.500)

## 2021-03-21 LAB — COMPREHENSIVE METABOLIC PANEL
ALT: 7 IU/L (ref 0–32)
AST: 15 IU/L (ref 0–40)
Albumin/Globulin Ratio: 1.7 (ref 1.2–2.2)
Albumin: 4.6 g/dL (ref 3.7–4.7)
Alkaline Phosphatase: 101 IU/L (ref 44–121)
BUN/Creatinine Ratio: 20 (ref 12–28)
BUN: 27 mg/dL (ref 8–27)
Bilirubin Total: 0.4 mg/dL (ref 0.0–1.2)
CO2: 20 mmol/L (ref 20–29)
Calcium: 10.1 mg/dL (ref 8.7–10.3)
Chloride: 105 mmol/L (ref 96–106)
Creatinine, Ser: 1.35 mg/dL — ABNORMAL HIGH (ref 0.57–1.00)
Globulin, Total: 2.7 g/dL (ref 1.5–4.5)
Glucose: 92 mg/dL (ref 65–99)
Potassium: 4.7 mmol/L (ref 3.5–5.2)
Sodium: 140 mmol/L (ref 134–144)
Total Protein: 7.3 g/dL (ref 6.0–8.5)
eGFR: 41 mL/min/{1.73_m2} — ABNORMAL LOW (ref >59)

## 2021-03-21 NOTE — Progress Notes (Signed)
Contacted via [facility] morning Jerzi, your labs have returned.  Overall they look at baseline for you.  Continue all current medications and visits with specialists.  If any questions let me know.  Have a great day!! Keep being awesome!!  Thank you for allowing me to participate in your care.  I appreciate you. Kindest regards, Nicholaos Schippers

## 2021-03-22 NOTE — Telephone Encounter (Signed)
Patient is aware of below message/recommendations and voiced her understanding. Order placed to [facility] for [place] card. Patient stated that she will also look into purchasing a SD card from [facility] or [facility]. Nothing further needed.

## 2021-03-26 ENCOUNTER — Telehealth: Payer: Self-pay | Admitting: Primary Care

## 2021-03-26 NOTE — Progress Notes (Signed)
Agree with the details of the visit as noted by Elizabeth Walsh, NP.  C. Laura Kaiel Weide, MD [facility] 

## 2021-03-26 NOTE — Telephone Encounter (Signed)
Download as been received and faxed to [facility] for review.

## 2021-03-27 DIAGNOSIS — G4733 Obstructive sleep apnea (adult) (pediatric): Secondary | ICD-10-CM | POA: Diagnosis not present

## 2021-04-02 ENCOUNTER — Telehealth: Payer: Medicare Other | Admitting: General Practice

## 2021-04-02 ENCOUNTER — Ambulatory Visit (INDEPENDENT_AMBULATORY_CARE_PROVIDER_SITE_OTHER): Payer: Medicare Other

## 2021-04-02 DIAGNOSIS — E782 Mixed hyperlipidemia: Secondary | ICD-10-CM

## 2021-04-02 DIAGNOSIS — I10 Essential (primary) hypertension: Secondary | ICD-10-CM

## 2021-04-02 DIAGNOSIS — J449 Chronic obstructive pulmonary disease, unspecified: Secondary | ICD-10-CM

## 2021-04-02 NOTE — Chronic Care Management (AMB) (Signed)
Chronic Care Management   CCM RN Visit Note  04/02/2021 Name: Madison Franklin MRN: [id number] DOB: 02-12-1946  Subjective: NORABELLE KONDO is a 75 y.o. year old female who is a primary care patient of Cannady, Barbaraann Faster, NP. The care management team was consulted for assistance with disease management and care coordination needs.    Engaged with patient by telephone for follow up visit in response to provider referral for case management and/or care coordination services.   Consent to Services:  The patient was given information about Chronic Care Management services, agreed to services, and gave verbal consent prior to initiation of services.  Please see initial visit note for detailed documentation.   Patient agreed to services and verbal consent obtained.   Assessment: Review of patient past medical history, allergies, medications, health status, including review of consultants reports, laboratory and other test data, was performed as part of comprehensive evaluation and provision of chronic care management services.   SDOH (Social Determinants of Health) assessments and interventions performed:    CCM Care Plan  Allergies  Allergen Reactions   Latex Itching and Rash    With latex gloves (when worn)    Outpatient Encounter Medications as of 04/02/2021  Medication Sig   acetaminophen (TYLENOL) 325 MG tablet Take 325 mg by mouth every 6 (six) hours as needed (for pain.).   albuterol (VENTOLIN HFA) 108 (90 Base) MCG/ACT inhaler Inhale 2 puffs into the lungs every 6 (six) hours as needed for wheezing or shortness of breath.   amLODipine (NORVASC) 5 MG tablet Take 1 tablet (5 mg total) by mouth daily.   aspirin EC 81 MG tablet Take 81 mg by mouth daily.   atorvastatin (LIPITOR) 20 MG tablet Take 1 tablet (20 mg total) by mouth daily.   hydrochlorothiazide (MICROZIDE) 12.5 MG capsule Take 1 capsule (12.5 mg total) by mouth daily.   lisinopril (ZESTRIL) 40 MG tablet Take 1 tablet (40 mg total)  by mouth daily.   Tiotropium Bromide Monohydrate (SPIRIVA RESPIMAT) 2.5 MCG/ACT AERS Inhale 2 puffs into the lungs daily.   Tiotropium Bromide-Olodaterol 2.5-2.5 MCG/ACT AERS Inhale 2 puffs into the lungs daily.   vitamin B-12 (CYANOCOBALAMIN) 1000 MCG tablet Take 1,000 mcg by mouth daily.   Vitamin D, Cholecalciferol, 1000 units TABS Take 1 tablet by mouth daily.   No facility-administered encounter medications on file as of 04/02/2021.    Patient Active Problem List   Diagnosis Date Noted   Mold exposure 03/20/2021   OSA (obstructive sleep apnea) 03/20/2021   IFG (impaired fasting glucose) 03/20/2021   Aortic atherosclerosis ([place]) 07/29/2020   Chronic respiratory failure with hypoxia ([place]) 07/29/2020   severe restrictive pulmonary physiology due to severe kyphoscoliosis 07/04/2020   Porcelain gallbladder 11/30/2019   Pulmonary arterial hypertension ([place]) 10/02/2019   Intestinal metaplasia of gastric mucosa    Chronic atrophic gastritis without bleeding 12/15/2018   B12 deficiency 08/04/2018   Nodule of upper lobe of left lung 05/26/2018   Anemia in chronic kidney disease 05/26/2018   Coronary artery calcification 06/18/2017   Heart murmur 04/13/2017   Chronic kidney disease, stage 3 ([place]) 04/13/2017   Advanced care planning/counseling discussion 01/05/2017   Hx of colonic polyps    Benign neoplasm of cecum    Osteopenia 07/26/2015   COPD, severe ([place]) 07/26/2015   Essential hypertension 07/26/2015   Hyperlipidemia 07/26/2015    Conditions to be addressed/monitored:HTN, HLD, and COPD  Care Plan : RNCM: HLD Management  Updates made by Salvatore Marvel  A, RN since 04/02/2021 12:00 AM     Problem: RNCM: HLD Management   Priority: Medium     Goal: RNCM: HLD   Priority: Medium  Note:   Current Barriers:  Poorly controlled hyperlipidemia, complicated by HTN, COPD  Current antihyperlipidemic regimen: Lipitor 20 mg Most recent lipid panel:  Lab Results  Component Value  Date   CHOL 192 04/20/2020   CHOL 181 11/24/2017   CHOL 198 04/13/2017   Lab Results  Component Value Date   HDL 52 04/20/2020   HDL 51 11/24/2017   HDL 62 04/13/2017   Lab Results  Component Value Date   LDLCALC 105 (H) 04/20/2020   LDLCALC 102 (H) 11/24/2017   LDLCALC 108 (H) 04/13/2017   Lab Results  Component Value Date   TRIG 205 (H) 04/20/2020   TRIG 142 11/24/2017   TRIG 139 04/13/2017   No results found for: CHOLHDL No results found for: LDLDIRECT  ASCVD risk enhancing conditions: age >59,  HTN, CKD, former smoker Unable to independently manage HLD Lacks social connections Does not contact provider office for questions/concerns  RN Care Manager Clinical Goal(s):  Over the next 120 days, patient will work with Consulting civil engineer, providers, and care team towards execution of optimized self-health management plan Over the next 120 days, patient will verbalize understanding of plan for effective management of HLD Over the next 120 days, patient will attend all scheduled medical appointments: 09/19/2021  Interventions: Collaboration with Venita Lick, NP regarding development and update of comprehensive plan of care as evidenced by provider attestation and co-signature Inter-disciplinary care team collaboration (see longitudinal plan of care) Medication review performed; medication list updated in electronic medical record.  Inter-disciplinary care team collaboration (see longitudinal plan of care) Referred to pharmacy team for assistance with HLD medication management Evaluation of current treatment plan related to HLD and patient's adherence to plan as established by provider.  04/02/2021:  Patient is compliant with medications.  Patient denies any issues or concerns. Advised patient to call the office for changes in condition or questions Reviewed scheduled/upcoming provider appointments including: 09-19-2021 at 10:00 am   Patient Goals/Self-Care Activities: Over  the next 120 days, patient will:   - call for medicine refill 2 or 3 days before it runs out - call if I am sick and can't take my medicine - keep a list of all the medicines I take; vitamins and herbals too - learn to read medicine labels - use a pillbox to sort medicine - use an alarm clock or phone to remind me to take my medicine - change to whole grain breads, cereal, pasta - drink 6 to 8 glasses of water each day - eat 5 or 6 small meals each day - limit fast food meals to no more than 1 per week - prepare main meal at home 3 to 5 days each week - read food labels for fat, fiber, carbohydrates and portion size - be open to making changes - I can manage, know and watch for signs of a heart attack - if I have chest pain, call for help - learn about small changes that will make a big difference - learn my personal risk factors - education plan reviewed and/or amended - empathy and reassurance conveyed - family/caregiver participation in learning encouraged - health literacy screen reviewed - patient's preferred learning methods utilized - questions encouraged - readiness to learn monitored  Follow Up Plan: Telephone follow up appointment with care management team member  scheduled for: 07-02-2021 at 2:00pm      Care Plan : RNCM: Hypertension (Adult)  Updates made by Inge Rise, RN since 04/02/2021 12:00 AM     Problem: RNCM: Hypertension (Hypertension)   Priority: Medium     Goal: RNCM: Hypertension Monitored   Priority: Medium  Note:    Objective:  Last practice recorded BP readings:  BP Readings from Last 3 Encounters:  03/20/21 130/64  03/20/21 (!) 156/72  11/29/20 (!) 153/66    Most recent eGFR/CrCl: No results found for: EGFR  No components found for: CRCL Current Barriers:  Knowledge Deficits related to basic understanding of hypertension pathophysiology and self care management Knowledge Deficits related to understanding of medications prescribed  for management of hypertension Limited Social Support Unable to independently manage HTN Unable to self administer medications as prescribed Does not contact provider office for questions/concerns Case Manager Clinical Goal(s):  Over the next 120 days, patient will verbalize understanding of plan for hypertension management Over the next 120 days, patient will attend all scheduled medical appointments: 09-19-2021 Over the next 120 days, patient will demonstrate improved adherence to prescribed treatment plan for hypertension as evidenced by taking all medications as prescribed, monitoring and recording blood pressure as directed, adhering to low sodium/DASH diet Over the next 120 days, patient will demonstrate improved health management independence as evidenced by checking blood pressure as directed and notifying PCP if SBP>160 or DBP > 90, taking all medications as prescribe, and adhering to a low sodium diet as discussed. Interventions:  Collaboration with Venita Lick, NP regarding development and update of comprehensive plan of care as evidenced by provider attestation and co-signature Inter-disciplinary care team collaboration (see longitudinal plan of care) Evaluation of current treatment plan related to hypertension self management and patient's adherence to plan as established by provider. 04/02/2021 Patient monitoring blood pressure once a week at home.  Yesterday's blood pressure was 133/73.  Patient denies any issues or concerns. Provided education to patient re: stroke prevention, s/s of heart attack and stroke, DASH diet, complications of uncontrolled blood pressure Reviewed medications with patient and discussed importance of compliance Discussed plans with patient for ongoing care management follow up and provided patient with direct contact information for care management team Advised patient, providing education and rationale, to monitor blood pressure daily and record, calling  PCP for findings outside established parameters.  Reviewed scheduled/upcoming provider appointments including: 09-19-2021 at 10:00am Patient Goals/Self-Care Activities Over the next 120 days, patient will:  - UNABLE to independently manage HTN Self administers medications as prescribed Attends all scheduled provider appointments Calls provider office for new concerns, questions, or BP outside discussed parameters Checks BP and records as discussed Follows a low sodium diet/DASH diet - blood pressure trends reviewed - depression screen reviewed - home or ambulatory blood pressure monitoring encouraged Follow Up Plan: Telephone follow up appointment with care management team member scheduled for: 07-02-2021 at 2:00pm    Care Plan : RNCM: COPD (Adult)  Updates made by Inge Rise, RN since 04/02/2021 12:00 AM     Problem: RNCM: Psychological Adjustment to Diagnosis (COPD)   Priority: Medium     Goal: RNCM: COPD Adjustment to Disease Achieved   Priority: Medium  Note:   Current Barriers:  Knowledge deficits related to basic understanding of COPD disease process Knowledge deficits related to basic COPD self care/management Knowledge deficit related to basic understanding of how to use inhalers and how inhaled medications work Knowledge deficit related to importance of energy  conservation Limited Social Support Unable to independently manage COPD Lacks social connections Does not contact provider office for questions/concerns  Case Manager Clinical Goal(s): Over the next 120 days patient will report using inhalers as prescribed including rinsing mouth after use Over the next 120 days patient will report utilizing pursed lip breathing for shortness of breath Over the next 120 days, patient will be able to verbalize understanding of COPD action plan and when to seek appropriate levels of medical care Over the next 120 days, patient will engage in lite exercise as tolerated to  build/regain stamina and strength and reduce shortness of breath through activity tolerance Over the next 120 days, patient will verbalize basic understanding of COPD disease process and self care activities Over the next 120 days, patient will not be hospitalized for COPD exacerbation  Interventions:  Collaboration with Venita Lick, NP regarding development and update of comprehensive plan of care as evidenced by provider attestation and co-signature Inter-disciplinary care team collaboration (see longitudinal plan of care) Provided patient with basic written and verbal COPD education on self care/management/and exacerbation prevention. 04/02/2021:  Patient started on O2 supplementation at 2 Liters/min via nasal cannula on 07/04/20. Patient reports using O2 "most of the time" and helps her breathing a great deal.  Provided patient with COPD action plan and reinforced importance of daily self assessment Discussed Pulmonary Rehab and offered to assist with referral placement Provided written and verbal instructions on pursed lip breathing and utilized returned demonstration as teach back Provided instruction about proper use of medications used for management of COPD including inhalers Advised patient to self assesses COPD action plan zone and make appointment with provider if in the yellow zone for 48 hours without improvement. Provided patient with education about the role of exercise in the management of COPD Advised patient to engage in light exercise as tolerated 3-5 days a week Provided education about and advised patient to utilize infection prevention strategies to reduce risk of respiratory infection  Patient Goals/Self-Care Activities:  - depression screen reviewed - emotional support provided - family involvement promoted - problem-solving facilitated - relaxation techniques promoted - verbalization of feelings encouraged Follow Up Plan: Telephone follow up appointment with care  management team member scheduled for: 07-02-2021 at 2:00 pm     Plan:Telephone follow up appointment with care management team member scheduled for:  07-02-2021 at 2:00pm  Salvatore Marvel RN, [facility] Mobile: [phone]

## 2021-04-02 NOTE — Patient Instructions (Signed)
Visit Information  PATIENT GOALS:  Goals Addressed             This Visit's Progress    COMPLETED: DIET - INCREASE WATER INTAKE       Recommend drinking at least 6-8 glasses of water a day      RNCM - Hypertension Monitored and Managed   On track    Timeframe:  Short-Term Goal Priority:  Medium Start Date:                             Expected End Date:        Follow Up Date: 07-02-2021            Patient Goals: - Self administers medications as prescribed - Attends all scheduled provider appointments - Calls provider office for new concerns, questions, or BP outside discussed parameters - Checks BP and records as discussed - Follows a low sodium diet/DASH diet - blood pressure trends reviewed - depression screen reviewed - home or ambulatory blood pressure monitoring encouraged              RNCM - Management of COPD   On track    Timeframe:  Short-Term Goal Priority:  Medium Start Date:                             Expected End Date:     Follow Up Date: 07-02-2021        Patient Goals: - depression screen reviewed - emotional support provided - family involvement promoted - problem-solving facilitated - relaxation techniques promoted - verbalization of feelings encouraged                   Notes:       RNCM - Management of HLD   On track    Timeframe:  Short-Term Goal Priority:  Medium Start Date:                             Expected End Date:                       Follow Up Date: 07-02-2021  Patient Goals: - call for medicine refill 2 or 3 days before it runs out - call if I am sick and can't take my medicine - keep a list of all the medicines I take; vitamins and herbals too - learn to read medicine labels - use a pillbox to sort medicine - use an alarm clock or phone to remind me to take my medicine - change to whole grain breads, cereal, pasta - drink 6 to 8 glasses of water each day - eat 5 or 6 small meals each day - limit fast food meals  to no more than 1 per week - prepare main meal at home 3 to 5 days each week - be open to making changes - I can manage, know and watch for signs of a heart attack - if I have chest pain, call for help - learn about small changes that will make a big difference - read food labels for fat, fiber, carbohydrates and portion size - learn my personal risk factors - education plan reviewed and/or amended - empathy and reassurance conveyed - family/caregiver participation in learning encouraged - health literacy screen reviewed - patient's preferred learning methods utilized -  questions encouraged - readiness to learn monitored    Notes:          Patient verbalizes understanding of instructions provided today and agrees to view in [place].   Telephone follow up appointment with care management team member scheduled for:07-02-2021 at 2:00pm  Salvatore Marvel RN, [facility] Mobile: [phone]

## 2021-04-05 DIAGNOSIS — J449 Chronic obstructive pulmonary disease, unspecified: Secondary | ICD-10-CM | POA: Diagnosis not present

## 2021-04-10 DIAGNOSIS — J449 Chronic obstructive pulmonary disease, unspecified: Secondary | ICD-10-CM | POA: Diagnosis not present

## 2021-04-19 DIAGNOSIS — G4733 Obstructive sleep apnea (adult) (pediatric): Secondary | ICD-10-CM | POA: Diagnosis not present

## 2021-04-29 NOTE — Progress Notes (Signed)
Received download from [facility].  Date 02/24/2021 - 03/25/2021.  Patient is 100% percent compliant with CPAP use, 28 days (93%) > than 4 hours.  Average usage days used 6 hours 17 minutes.  Pressures 7 cm H2O, residual AHI 0.5.  No changes recommended.

## 2021-05-05 DIAGNOSIS — J449 Chronic obstructive pulmonary disease, unspecified: Secondary | ICD-10-CM | POA: Diagnosis not present

## 2021-05-10 DIAGNOSIS — J449 Chronic obstructive pulmonary disease, unspecified: Secondary | ICD-10-CM | POA: Diagnosis not present

## 2021-05-20 DIAGNOSIS — G4733 Obstructive sleep apnea (adult) (pediatric): Secondary | ICD-10-CM | POA: Diagnosis not present

## 2021-05-25 ENCOUNTER — Other Ambulatory Visit: Payer: Self-pay | Admitting: *Deleted

## 2021-05-25 DIAGNOSIS — D509 Iron deficiency anemia, unspecified: Secondary | ICD-10-CM

## 2021-05-25 DIAGNOSIS — D631 Anemia in chronic kidney disease: Secondary | ICD-10-CM

## 2021-05-29 ENCOUNTER — Encounter: Payer: Self-pay | Admitting: Oncology

## 2021-05-29 ENCOUNTER — Inpatient Hospital Stay: Payer: Medicare Other

## 2021-05-29 ENCOUNTER — Inpatient Hospital Stay: Payer: Medicare Other | Attending: Oncology

## 2021-05-29 ENCOUNTER — Other Ambulatory Visit: Payer: Self-pay

## 2021-05-29 ENCOUNTER — Telehealth: Payer: Self-pay | Admitting: Oncology

## 2021-05-29 ENCOUNTER — Inpatient Hospital Stay (HOSPITAL_BASED_OUTPATIENT_CLINIC_OR_DEPARTMENT_OTHER): Payer: Medicare Other | Admitting: Oncology

## 2021-05-29 VITALS — BP 163/76 | HR 61 | Temp 97.3°F | Resp 20 | Wt 161.5 lb

## 2021-05-29 DIAGNOSIS — I10 Essential (primary) hypertension: Secondary | ICD-10-CM | POA: Insufficient documentation

## 2021-05-29 DIAGNOSIS — D631 Anemia in chronic kidney disease: Secondary | ICD-10-CM

## 2021-05-29 DIAGNOSIS — E538 Deficiency of other specified B group vitamins: Secondary | ICD-10-CM

## 2021-05-29 DIAGNOSIS — J449 Chronic obstructive pulmonary disease, unspecified: Secondary | ICD-10-CM | POA: Insufficient documentation

## 2021-05-29 DIAGNOSIS — Z79899 Other long term (current) drug therapy: Secondary | ICD-10-CM | POA: Insufficient documentation

## 2021-05-29 DIAGNOSIS — E611 Iron deficiency: Secondary | ICD-10-CM | POA: Diagnosis not present

## 2021-05-29 DIAGNOSIS — D509 Iron deficiency anemia, unspecified: Secondary | ICD-10-CM

## 2021-05-29 DIAGNOSIS — R5383 Other fatigue: Secondary | ICD-10-CM | POA: Insufficient documentation

## 2021-05-29 DIAGNOSIS — N183 Chronic kidney disease, stage 3 unspecified: Secondary | ICD-10-CM

## 2021-05-29 DIAGNOSIS — Z87891 Personal history of nicotine dependence: Secondary | ICD-10-CM | POA: Insufficient documentation

## 2021-05-29 LAB — CBC
HCT: 35.5 % — ABNORMAL LOW (ref 36.0–46.0)
Hemoglobin: 10.8 g/dL — ABNORMAL LOW (ref 12.0–15.0)
MCH: 25 pg — ABNORMAL LOW (ref 26.0–34.0)
MCHC: 30.4 g/dL (ref 30.0–36.0)
MCV: 82.2 fL (ref 80.0–100.0)
Platelets: 174 10*3/uL (ref 150–400)
RBC: 4.32 MIL/uL (ref 3.87–5.11)
RDW: 14.8 % (ref 11.5–15.5)
WBC: 8.7 10*3/uL (ref 4.0–10.5)
nRBC: 0 % (ref 0.0–0.2)

## 2021-05-29 LAB — FERRITIN: Ferritin: 29 ng/mL (ref 11–307)

## 2021-05-29 LAB — IRON AND TIBC
Iron: 40 ug/dL (ref 28–170)
Saturation Ratios: 11 % (ref 10.4–31.8)
TIBC: 379 ug/dL (ref 250–450)
UIBC: 339 ug/dL

## 2021-05-29 NOTE — Progress Notes (Signed)
Spoke with patient/patient's husband. She would like to have IV iron again. Got her scheduled for 5 doses.

## 2021-05-29 NOTE — Telephone Encounter (Signed)
Spoke with patient's husband to schedule 5 doses of venofer. Patient will get print out at her first iron infusion on 8/16.

## 2021-05-30 ENCOUNTER — Encounter: Payer: Self-pay | Admitting: Hematology and Oncology

## 2021-05-30 ENCOUNTER — Other Ambulatory Visit: Payer: Self-pay

## 2021-05-30 DIAGNOSIS — K828 Other specified diseases of gallbladder: Secondary | ICD-10-CM

## 2021-05-30 NOTE — Progress Notes (Signed)
Hematology/Oncology Consult note [facility]  [phone] [phone]  Patient Care Team: Venita Lick, NP as PCP - General (Nurse Practitioner) [name], MD as Consulting Physician (Gastroenterology) Anthonette Legato, MD (Internal Medicine) Jules Husbands, MD as Consulting Physician (General Surgery) Vanita Ingles, RN as Case Manager (General Practice)   Name of the patient: Madison Franklin  [id number]  [birth year]   Date of visit: 05/30/21  Diagnosis-anemia likely multifactorial secondary to anemia of chronic kidney disease and iron deficiency  Chief complaint/ Reason for visit-routine follow-up of anemia   Heme/Onc history: patient is a 75 year old female who was seen previously by Dr. Mike Gip for iron deficiency anemia and anemia of chronic kidney disease.  She has received IV iron in the past.  Patient also has history of stage III CKD.  She has a history of colonic polyps.   Colonoscopy on 11/14/2016 revealed a sub-optimal prep. Findings included a 10 mm polyp in the cecum, seven 4-10 mm sessile polyps in the transverse colon, a 6 mm polyp in the descending colon, three 5-6 mm sessile polyps in the sigmoid colon,.  Pathology revealed 7 tubular adenomas, 2 hyperplastic polyps, and 2 inflammatory polyps.  Colonoscopy on 11/10/2018 revealed three 5 to 6 mm polyps in the ascending colon (2 tubular adenomas; sessile serrated adenoma), one 8 mm polyp in the transverse colon (tubular adenoma), one 5 mm polyp in the transverse colon (tubular adenoma), one 5 mm polyp in the sigmoid colon (tubular adenoma), one 7 mm polyp at the recto-sigmoid colon (hyperplastic polyp), and non-bleeding external and internal hemorrhoids.  She will have a follow-up colonoscopy in 3 years.  Patient has not required any EPO so far   EGD on 11/10/2018 revealed non-bleeding erosive gastropathy. Pathology revealed chronic inactive atrophic gastritis with intestinal  metaplasia.  There was a single non-bleeding erosion in the upper third of the esophagus.  EGD on 05/11/2019 revealed normal duodenal bulb and second portion of the duodenum. There was non-bleeding erosive gastropathy and gastric mucosal atrophy.  There was a normal gastroesophageal junction and esophagus. Pathology revealed chronic inactive atrophic gastritis with intestinal metaplasia, negative for H pylori.    Interval history-patient reports some baseline fatigue but denies other complaints at this time  ECOG PS- 1 Pain scale- 0   Review of systems- Review of Systems  Constitutional:  Positive for malaise/fatigue. Negative for chills, fever and weight loss.  HENT:  Negative for congestion, ear discharge and nosebleeds.   Eyes:  Negative for blurred vision.  Respiratory:  Negative for cough, hemoptysis, sputum production, shortness of breath and wheezing.   Cardiovascular:  Negative for chest pain, palpitations, orthopnea and claudication.  Gastrointestinal:  Negative for abdominal pain, blood in stool, constipation, diarrhea, heartburn, melena, nausea and vomiting.  Genitourinary:  Negative for dysuria, flank pain, frequency, hematuria and urgency.  Musculoskeletal:  Negative for back pain, joint pain and myalgias.  Skin:  Negative for rash.  Neurological:  Negative for dizziness, tingling, focal weakness, seizures, weakness and headaches.  Endo/Heme/Allergies:  Does not bruise/bleed easily.  Psychiatric/Behavioral:  Negative for depression and suicidal ideas. The patient does not have insomnia.       Allergies  Allergen Reactions   Latex Itching and Rash    With latex gloves (when worn)     Past Medical History:  Diagnosis Date   COPD (chronic obstructive pulmonary disease) (HCC)    Dyspnea    Hearing aid worn    bilateral  Hyperlipidemia    Hypertension    Osteoporosis    Personal history of tobacco use, presenting hazards to health 08/15/2015   Pulmonary  hypertension, mild (HCC)    mild to moderate     Past Surgical History:  Procedure Laterality Date   ABDOMINAL HYSTERECTOMY     COLONOSCOPY WITH PROPOFOL N/A 11/14/2016   Procedure: COLONOSCOPY WITH PROPOFOL;  Surgeon: [name], MD;  Location: [place];  Service: Endoscopy;  Laterality: N/A;   COLONOSCOPY WITH PROPOFOL N/A 11/10/2018   Procedure: COLONOSCOPY WITH PROPOFOL;  Surgeon: Lin Landsman, MD;  Location: [place];  Service: Endoscopy;  Laterality: N/A;   ESOPHAGOGASTRODUODENOSCOPY (EGD) WITH PROPOFOL  11/10/2018   Procedure: ESOPHAGOGASTRODUODENOSCOPY (EGD) WITH PROPOFOL;  Surgeon: Lin Landsman, MD;  Location: [place];  Service: Endoscopy;;   ESOPHAGOGASTRODUODENOSCOPY (EGD) WITH PROPOFOL N/A 05/11/2019   Procedure: ESOPHAGOGASTRODUODENOSCOPY (EGD) WITH PROPOFOL;  Surgeon: Lin Landsman, MD;  Location: [place];  Service: Endoscopy;  Laterality: N/A;   long nodule     POLYPECTOMY  11/14/2016   Procedure: POLYPECTOMY;  Surgeon: [name], MD;  Location: [place];  Service: Endoscopy;;   POLYPECTOMY  11/10/2018   Procedure: POLYPECTOMY;  Surgeon: Lin Landsman, MD;  Location: [place];  Service: Endoscopy;;   RIGHT HEART CATH Right 07/14/2017   Procedure: RIGHT HEART CATH;  Surgeon: Nelva Bush, MD;  Location: [place] [facility];  Service: Cardiovascular;  Laterality: Right;    Social History   Socioeconomic History   Marital status: Married    Spouse name: Not on file   Number of children: Not on file   Years of education: 12   Highest education level: 12th grade  Occupational History   Occupation: retired  Tobacco Use   Smoking status: Former    Packs/day: 0.50    Years: 30.00    Pack years: 15.00    Types: Cigarettes    Quit date: 01/17/2017    Years since quitting: 4.3   Smokeless tobacco: Never  Vaping Use   Vaping Use: Never used  Substance and Sexual Activity    Alcohol use: No    Alcohol/week: 0.0 standard drinks   Drug use: No   Sexual activity: Yes  Other Topics Concern   Not on file  Social History Narrative   Not on file   Social Determinants of Health   Financial Resource Strain: Low Risk    Difficulty of Paying Living Expenses: Not hard at all  Food Insecurity: No Food Insecurity   Worried About Charity fundraiser in the Last Year: Never true   [place] in the Last Year: Never true  Transportation Needs: No Transportation Needs   Lack of Transportation (Medical): No   Lack of Transportation (Non-Medical): No  Physical Activity: Inactive   Days of Exercise per Week: 0 days   Minutes of Exercise per Session: 0 min  Stress: No Stress Concern Present   Feeling of Stress : Not at all  Social Connections: Not on file  Intimate Partner Violence: Not on file    Family History  Problem Relation Age of Onset   Heart disease Neg Hx      Current Outpatient Medications:    acetaminophen (TYLENOL) 325 MG tablet, Take 325 mg by mouth every 6 (six) hours as needed (for pain.)., Disp: , Rfl:    albuterol (VENTOLIN HFA) 108 (90 Base) MCG/ACT inhaler, Inhale 2 puffs into the lungs every 6 (  six) hours as needed for wheezing or shortness of breath., Disp: 16 g, Rfl: 3   amLODipine (NORVASC) 5 MG tablet, Take 1 tablet (5 mg total) by mouth daily., Disp: 90 tablet, Rfl: 4   aspirin EC 81 MG tablet, Take 81 mg by mouth daily., Disp: , Rfl:    atorvastatin (LIPITOR) 20 MG tablet, Take 1 tablet (20 mg total) by mouth daily., Disp: 90 tablet, Rfl: 3   hydrochlorothiazide (MICROZIDE) 12.5 MG capsule, Take 1 capsule (12.5 mg total) by mouth daily., Disp: 90 capsule, Rfl: 4   lisinopril (ZESTRIL) 40 MG tablet, Take 1 tablet (40 mg total) by mouth daily., Disp: 90 tablet, Rfl: 4   vitamin B-12 (CYANOCOBALAMIN) 1000 MCG tablet, Take 1,000 mcg by mouth daily., Disp: , Rfl:    Vitamin D, Cholecalciferol, 1000 units TABS, Take 1 tablet by mouth  daily., Disp: , Rfl:    Tiotropium Bromide Monohydrate (SPIRIVA RESPIMAT) 2.5 MCG/ACT AERS, Inhale 2 puffs into the lungs daily. (Patient not taking: Reported on 05/29/2021), Disp: 4 g, Rfl: 0   Tiotropium Bromide-Olodaterol 2.5-2.5 MCG/ACT AERS, Inhale 2 puffs into the lungs daily. (Patient not taking: Reported on 05/29/2021), Disp: 4 g, Rfl: 6  Physical exam:  Vitals:   05/29/21 0950  BP: (!) 163/76  Pulse: 61  Resp: 20  Temp: (!) 97.3 F (36.3 C)  SpO2: 99%  Weight: 161 lb 7.8 oz (73.2 kg)   Physical Exam Constitutional:      General: She is not in acute distress. Cardiovascular:     Rate and Rhythm: Normal rate and regular rhythm.     Heart sounds: Normal heart sounds.  Pulmonary:     Effort: Pulmonary effort is normal.     Breath sounds: Normal breath sounds.  Abdominal:     General: Bowel sounds are normal.     Palpations: Abdomen is soft.  Skin:    General: Skin is warm and dry.  Neurological:     Mental Status: She is alert and oriented to person, place, and time.     CMP Latest Ref Rng & Units 03/20/2021  Glucose 65 - 99 mg/dL 92  BUN 8 - 27 mg/dL 27  Creatinine 0.57 - 1.00 mg/dL 1.35(H)  Sodium 134 - 144 mmol/L 140  Potassium 3.5 - 5.2 mmol/L 4.7  Chloride 96 - 106 mmol/L 105  CO2 20 - 29 mmol/L 20  Calcium 8.7 - 10.3 mg/dL 10.1  Total Protein 6.0 - 8.5 g/dL 7.3  Total Bilirubin 0.0 - 1.2 mg/dL 0.4  Alkaline Phos 44 - 121 IU/L 101  AST 0 - 40 IU/L 15  ALT 0 - 32 IU/L 7   CBC Latest Ref Rng & Units 05/29/2021  WBC 4.0 - 10.5 K/uL 8.7  Hemoglobin 12.0 - 15.0 g/dL 10.8(L)  Hematocrit 36.0 - 46.0 % 35.5(L)  Platelets 150 - 400 K/uL 174    Assessment and plan- Patient is a 75 y.o. female with history of iron deficiency anemia and anemia of chronic kidney disease here for routine follow-up   Patient's hemoglobin has remained stable around 10 for the last 1-1/2-year without any significant variation.  Her ferritin presently is low at 29 and an iron saturation is  low at 11%.  Different FOB would like to give her IV iron and she will proceed with 5 doses of Venofer at this time.  Repeat CBC ferritin and iron studies in 4 and 8 months and I will see her back in 8 months   Visit  Diagnosis 1. Anemia in stage 3 chronic kidney disease, unspecified whether stage 3a or 3b CKD ([place])   2. B12 deficiency      Dr. Randa Evens, MD, MPH [facility] [id number] 05/30/2021 10:21 AM

## 2021-06-04 ENCOUNTER — Inpatient Hospital Stay: Payer: Medicare Other

## 2021-06-04 ENCOUNTER — Other Ambulatory Visit: Payer: Self-pay

## 2021-06-04 VITALS — BP 131/74 | HR 77 | Resp 18

## 2021-06-04 DIAGNOSIS — Z79899 Other long term (current) drug therapy: Secondary | ICD-10-CM | POA: Diagnosis not present

## 2021-06-04 DIAGNOSIS — R5383 Other fatigue: Secondary | ICD-10-CM | POA: Diagnosis not present

## 2021-06-04 DIAGNOSIS — Z87891 Personal history of nicotine dependence: Secondary | ICD-10-CM | POA: Diagnosis not present

## 2021-06-04 DIAGNOSIS — E538 Deficiency of other specified B group vitamins: Secondary | ICD-10-CM | POA: Diagnosis not present

## 2021-06-04 DIAGNOSIS — N183 Chronic kidney disease, stage 3 unspecified: Secondary | ICD-10-CM | POA: Diagnosis not present

## 2021-06-04 DIAGNOSIS — D631 Anemia in chronic kidney disease: Secondary | ICD-10-CM | POA: Diagnosis not present

## 2021-06-04 DIAGNOSIS — J449 Chronic obstructive pulmonary disease, unspecified: Secondary | ICD-10-CM | POA: Diagnosis not present

## 2021-06-04 DIAGNOSIS — E611 Iron deficiency: Secondary | ICD-10-CM | POA: Diagnosis not present

## 2021-06-04 DIAGNOSIS — I10 Essential (primary) hypertension: Secondary | ICD-10-CM | POA: Diagnosis not present

## 2021-06-04 MED ORDER — SODIUM CHLORIDE 0.9 % IV SOLN: 200.0000 mg | INTRAVENOUS | Status: DC

## 2021-06-04 MED ORDER — SODIUM CHLORIDE 0.9 % IV SOLN
Freq: Once | INTRAVENOUS | Status: AC
  Filled 2021-06-04: qty 250

## 2021-06-04 MED ORDER — IRON SUCROSE 20 MG/ML IV SOLN
200.0000 mg | Freq: Once | INTRAVENOUS | Status: AC
  Administered 2021-06-04: 200 mg via INTRAVENOUS
  Filled 2021-06-04: qty 10

## 2021-06-05 DIAGNOSIS — J449 Chronic obstructive pulmonary disease, unspecified: Secondary | ICD-10-CM | POA: Diagnosis not present

## 2021-06-06 ENCOUNTER — Other Ambulatory Visit: Payer: Self-pay

## 2021-06-06 ENCOUNTER — Inpatient Hospital Stay: Payer: Medicare Other

## 2021-06-06 VITALS — BP 127/69 | HR 80 | Temp 97.4°F | Resp 18

## 2021-06-06 DIAGNOSIS — Z87891 Personal history of nicotine dependence: Secondary | ICD-10-CM | POA: Diagnosis not present

## 2021-06-06 DIAGNOSIS — J449 Chronic obstructive pulmonary disease, unspecified: Secondary | ICD-10-CM | POA: Diagnosis not present

## 2021-06-06 DIAGNOSIS — D631 Anemia in chronic kidney disease: Secondary | ICD-10-CM | POA: Diagnosis not present

## 2021-06-06 DIAGNOSIS — N183 Chronic kidney disease, stage 3 unspecified: Secondary | ICD-10-CM

## 2021-06-06 DIAGNOSIS — E611 Iron deficiency: Secondary | ICD-10-CM | POA: Diagnosis not present

## 2021-06-06 DIAGNOSIS — E538 Deficiency of other specified B group vitamins: Secondary | ICD-10-CM | POA: Diagnosis not present

## 2021-06-06 DIAGNOSIS — I10 Essential (primary) hypertension: Secondary | ICD-10-CM | POA: Diagnosis not present

## 2021-06-06 DIAGNOSIS — Z79899 Other long term (current) drug therapy: Secondary | ICD-10-CM | POA: Diagnosis not present

## 2021-06-06 DIAGNOSIS — R5383 Other fatigue: Secondary | ICD-10-CM | POA: Diagnosis not present

## 2021-06-06 MED ORDER — IRON SUCROSE 20 MG/ML IV SOLN
200.0000 mg | Freq: Once | INTRAVENOUS | Status: AC
  Administered 2021-06-06: 200 mg via INTRAVENOUS

## 2021-06-06 MED ORDER — SODIUM CHLORIDE 0.9 % IV SOLN
Freq: Once | INTRAVENOUS | Status: AC
  Filled 2021-06-06: qty 250

## 2021-06-10 DIAGNOSIS — J449 Chronic obstructive pulmonary disease, unspecified: Secondary | ICD-10-CM | POA: Diagnosis not present

## 2021-06-11 ENCOUNTER — Other Ambulatory Visit: Payer: Self-pay

## 2021-06-11 ENCOUNTER — Inpatient Hospital Stay: Payer: Medicare Other

## 2021-06-11 VITALS — BP 148/80 | HR 78 | Temp 97.0°F | Resp 18

## 2021-06-11 DIAGNOSIS — E611 Iron deficiency: Secondary | ICD-10-CM | POA: Diagnosis not present

## 2021-06-11 DIAGNOSIS — Z87891 Personal history of nicotine dependence: Secondary | ICD-10-CM | POA: Diagnosis not present

## 2021-06-11 DIAGNOSIS — N183 Chronic kidney disease, stage 3 unspecified: Secondary | ICD-10-CM | POA: Diagnosis not present

## 2021-06-11 DIAGNOSIS — R5383 Other fatigue: Secondary | ICD-10-CM | POA: Diagnosis not present

## 2021-06-11 DIAGNOSIS — D631 Anemia in chronic kidney disease: Secondary | ICD-10-CM

## 2021-06-11 DIAGNOSIS — J449 Chronic obstructive pulmonary disease, unspecified: Secondary | ICD-10-CM | POA: Diagnosis not present

## 2021-06-11 DIAGNOSIS — E538 Deficiency of other specified B group vitamins: Secondary | ICD-10-CM | POA: Diagnosis not present

## 2021-06-11 DIAGNOSIS — I10 Essential (primary) hypertension: Secondary | ICD-10-CM | POA: Diagnosis not present

## 2021-06-11 DIAGNOSIS — Z79899 Other long term (current) drug therapy: Secondary | ICD-10-CM | POA: Diagnosis not present

## 2021-06-11 MED ORDER — SODIUM CHLORIDE 0.9 % IV SOLN: 200.0000 mg | INTRAVENOUS | Status: DC

## 2021-06-11 MED ORDER — SODIUM CHLORIDE 0.9 % IV SOLN
Freq: Once | INTRAVENOUS | Status: AC
  Filled 2021-06-11: qty 250

## 2021-06-11 MED ORDER — IRON SUCROSE 20 MG/ML IV SOLN
200.0000 mg | Freq: Once | INTRAVENOUS | Status: AC
  Administered 2021-06-11: 200 mg via INTRAVENOUS
  Filled 2021-06-11: qty 10

## 2021-06-11 NOTE — Patient Instructions (Signed)

## 2021-06-13 ENCOUNTER — Inpatient Hospital Stay: Payer: Medicare Other

## 2021-06-13 ENCOUNTER — Other Ambulatory Visit: Payer: Self-pay

## 2021-06-13 VITALS — BP 150/74 | HR 74 | Temp 97.2°F | Resp 18

## 2021-06-13 DIAGNOSIS — J449 Chronic obstructive pulmonary disease, unspecified: Secondary | ICD-10-CM | POA: Diagnosis not present

## 2021-06-13 DIAGNOSIS — D631 Anemia in chronic kidney disease: Secondary | ICD-10-CM | POA: Diagnosis not present

## 2021-06-13 DIAGNOSIS — I10 Essential (primary) hypertension: Secondary | ICD-10-CM | POA: Diagnosis not present

## 2021-06-13 DIAGNOSIS — Z87891 Personal history of nicotine dependence: Secondary | ICD-10-CM | POA: Diagnosis not present

## 2021-06-13 DIAGNOSIS — E611 Iron deficiency: Secondary | ICD-10-CM | POA: Diagnosis not present

## 2021-06-13 DIAGNOSIS — R5383 Other fatigue: Secondary | ICD-10-CM | POA: Diagnosis not present

## 2021-06-13 DIAGNOSIS — E538 Deficiency of other specified B group vitamins: Secondary | ICD-10-CM | POA: Diagnosis not present

## 2021-06-13 DIAGNOSIS — N183 Chronic kidney disease, stage 3 unspecified: Secondary | ICD-10-CM | POA: Diagnosis not present

## 2021-06-13 DIAGNOSIS — Z79899 Other long term (current) drug therapy: Secondary | ICD-10-CM | POA: Diagnosis not present

## 2021-06-13 MED ORDER — SODIUM CHLORIDE 0.9 % IV SOLN: 200.0000 mg | INTRAVENOUS | Status: DC

## 2021-06-13 MED ORDER — SODIUM CHLORIDE 0.9 % IV SOLN
Freq: Once | INTRAVENOUS | Status: AC
  Filled 2021-06-13: qty 250

## 2021-06-13 MED ORDER — IRON SUCROSE 20 MG/ML IV SOLN
200.0000 mg | Freq: Once | INTRAVENOUS | Status: AC
  Administered 2021-06-13: 200 mg via INTRAVENOUS
  Filled 2021-06-13: qty 10

## 2021-06-18 ENCOUNTER — Other Ambulatory Visit: Payer: Self-pay

## 2021-06-18 ENCOUNTER — Ambulatory Visit (INDEPENDENT_AMBULATORY_CARE_PROVIDER_SITE_OTHER): Payer: Medicare Other | Admitting: Pulmonary Disease

## 2021-06-18 ENCOUNTER — Encounter: Payer: Self-pay | Admitting: Pulmonary Disease

## 2021-06-18 VITALS — BP 130/64 | HR 85 | Temp 98.2°F | Ht 61.0 in | Wt 161.2 lb

## 2021-06-18 DIAGNOSIS — J449 Chronic obstructive pulmonary disease, unspecified: Secondary | ICD-10-CM

## 2021-06-18 DIAGNOSIS — G4733 Obstructive sleep apnea (adult) (pediatric): Secondary | ICD-10-CM

## 2021-06-18 DIAGNOSIS — R942 Abnormal results of pulmonary function studies: Secondary | ICD-10-CM

## 2021-06-18 DIAGNOSIS — I272 Pulmonary hypertension, unspecified: Secondary | ICD-10-CM

## 2021-06-18 DIAGNOSIS — J9611 Chronic respiratory failure with hypoxia: Secondary | ICD-10-CM

## 2021-06-18 MED ORDER — ALBUTEROL SULFATE HFA 108 (90 BASE) MCG/ACT IN AERS: 2.0000 | INHALATION_SPRAY | Freq: Four times a day (QID) | RESPIRATORY_TRACT | 3 refills | Status: DC | PRN

## 2021-06-18 MED ORDER — STIOLTO RESPIMAT 2.5-2.5 MCG/ACT IN AERS
2.5000 ug | INHALATION_SPRAY | Freq: Two times a day (BID) | RESPIRATORY_TRACT | 0 refills | Status: DC
Start: 2021-06-18 — End: 2022-09-24

## 2021-06-18 NOTE — Patient Instructions (Addendum)
We are going to schedule another ultrasound of the heart to evaluate the artery going from your heart to your lungs.  I am giving you some more samples of the Stiolto 2 puffs daily.  We have refilled your albuterol (emergency inhaler).   We will see you in follow-up in 3 months time call sooner should any new problems arise.

## 2021-06-18 NOTE — Progress Notes (Signed)
 Subjective:    Patient ID: Madison Franklin, female    DOB: 09-Dec-1945, 75 y.o.   MRN: [id number] Chief Complaint  Patient presents with   Follow-up    Patient states her breathing has been better.    Requesting MD/Service: Gabriel Cirri, NP.  Current primary provider Aura Dials, NP Date of initial consultation: 05/11/17 by Dr. Billy Fischer Reason for consultation: Former smoker, exertional dyspnea   PT PROFILE: 75 y.o. female former smoker (approx 15 P-Y history) referred for evaluation of exertional dyspnea   PROBLEMS: Former smoker Severe thoracic kyphoscoliosis Moderate to severe restrictive pulmonary physiology SEVERE pulmonary hypertension     DATA: CT chest 03/13/17: Severe thoracic kyphoscoliosis. 4 mm nodule in left upper lobe (decreased in size from previously). Minimal emphysema Echocardiogram 05/28/17: LVEF 60-65%.  Grade 1 diastolic dysfunction.  No significant valvular disease noted.  No evidence of right-sided overload. RHC 07/14/17: Mild to moderate pulmonary hypertension (mean PAP 34 mmHg, PVR 5.7 Wood units) PFTs 10/01/17: FVC: 1.28 L (49 %pred), FEV1: 0.83 > 0.91 L (41 > 45 %pred), FEV1/FVC: 65%; lung volume measurements invalid, DLCO 6.6 (31 %pred) ONO 10/26/17:  Minimal brief desaturation. Does not require nocturnal O2 LDCT chest 06/01/18: Lung-RADS 2, benign appearance or behavior. Aortic atherosclerosis, coronary artery atherosclerosis and emphysema. Pulmonary artery enlargement suggests pulmonary arterial hypertension. Gallbladder wall calcifications indicative of "porcelain gallbladder". Very large thyroid gland extending into upper mediastinum. 2D echo 06/12/2020: LVEF 60 to 65%.  Grade 1 diastolic dysfunction.  Elevated left atrial pressure.  Increased size of right ventricle.  Severely elevated pulmonary artery systolic pressure.  Estimated right ventricular systolic pressure 61.6 mmHg. PFTs 06/12/2020: Severe restriction, element of obstruction also  evident by flow volume loop.  Diffusion capacity impaired moderately.     INTERVAL: Last seen 03/20/2021 by Ames Dura, NP.  No worsening of symptoms in the interval.  Sleep study performed in October obstructive sleep apnea noted.  HPI 75 year old female, former smoker quit in 2018 (15-pack-year history).  Past medical history significant for, moderate-severe restrictive pulmonary disease due to severe kyphoscoliosis, severe pulmonary hypertension, nodule left upper lobe lung.  Presents for follow-up.  She has been using CPAP for obstructive sleep apnea and notes that this helps her symptoms of shortness of breath.  Despite the severity of her issues she maintains that she feels "okay".  She is compliant with her oxygen therapy.   Review of Systems A 10 point review of systems was performed and it is as noted above otherwise negative.  Patient Active Problem List   Diagnosis Date Noted   Mold exposure 03/20/2021   OSA (obstructive sleep apnea) 03/20/2021   IFG (impaired fasting glucose) 03/20/2021   Aortic atherosclerosis (HCC) 07/29/2020   Chronic respiratory failure with hypoxia (HCC) 07/29/2020   severe restrictive pulmonary physiology due to severe kyphoscoliosis 07/04/2020   Porcelain gallbladder 11/30/2019   Severe pulmonary hypertension (HCC) 10/02/2019   Intestinal metaplasia of gastric mucosa    Chronic atrophic gastritis without bleeding 12/15/2018   B12 deficiency 08/04/2018   Nodule of upper lobe of left lung 05/26/2018   Anemia in chronic kidney disease 05/26/2018   Coronary artery calcification 06/18/2017   Heart murmur 04/13/2017   Chronic kidney disease, stage 3 (HCC) 04/13/2017   Advanced care planning/counseling discussion 01/05/2017   Hx of colonic polyps    Benign neoplasm of cecum    Osteopenia 07/26/2015   COPD, severe (HCC) 07/26/2015   Essential hypertension 07/26/2015   Hyperlipidemia 07/26/2015   Social  History   Tobacco Use   Smoking status:  Former    Current packs/day: 0.00    Average packs/day: 0.5 packs/day for 30.0 years (15.0 ttl pk-yrs)    Types: Cigarettes    Start date: 01/18/1987    Quit date: 01/17/2017    Years since quitting: 6.8   Smokeless tobacco: Never  Substance Use Topics   Alcohol use: No    Alcohol/week: 0.0 standard drinks of alcohol   Allergies  Allergen Reactions   Latex Itching and Rash    With latex gloves (when worn)   Current Meds  Medication Sig   acetaminophen (TYLENOL) 325 MG tablet Take 325 mg by mouth every 6 (six) hours as needed (for pain.).   albuterol (VENTOLIN HFA) 108 (90 Base) MCG/ACT inhaler Inhale 2 puffs into the lungs every 6 (six) hours as needed for wheezing or shortness of breath.   amLODipine (NORVASC) 5 MG tablet Take 1 tablet (5 mg total) by mouth daily.   aspirin EC 81 MG tablet Take 81 mg by mouth daily.   atorvastatin (LIPITOR) 20 MG tablet Take 1 tablet (20 mg total) by mouth daily.   hydrochlorothiazide (MICROZIDE) 12.5 MG capsule Take 1 capsule (12.5 mg total) by mouth daily.   lisinopril (ZESTRIL) 40 MG tablet Take 1 tablet (40 mg total) by mouth daily.   Tiotropium Bromide Monohydrate (SPIRIVA RESPIMAT) 2.5 MCG/ACT AERS Inhale 2 puffs into the lungs daily.   Tiotropium Bromide-Olodaterol (STIOLTO RESPIMAT) 2.5-2.5 MCG/ACT AERS Inhale 2.5 mcg into the lungs 2 (two) times daily.   Tiotropium Bromide-Olodaterol 2.5-2.5 MCG/ACT AERS Inhale 2 puffs into the lungs daily.   vitamin B-12 (CYANOCOBALAMIN) 1000 MCG tablet Take 1,000 mcg by mouth daily.   Vitamin D, Cholecalciferol, 1000 units TABS Take 1 tablet by mouth daily.   Immunization History  Administered Date(s) Administered   Fluad Quad(high Dose 65+) 08/03/2020   Influenza, High Dose Seasonal PF 09/03/2016   Influenza,inj,Quad PF,6+ Mos 07/27/2015   Influenza,inj,quad, With Preservative 08/24/2018, 07/15/2019   Influenza-Unspecified 08/14/2017, 08/24/2018   Moderna Sars-Covid-2 Vaccination 10/18/2020    PFIZER(Purple Top)SARS-COV-2 Vaccination 12/15/2019, 01/11/2020   Pneumococcal Conjugate-13 01/12/2015   Pneumococcal Polysaccharide-23 07/01/2011   Td 07/27/2015      Objective:   Physical Exam BP 130/64 (BP Location: Left Arm, Patient Position: Sitting, Cuff Size: Normal)   Pulse 85   Temp 98.2 F (36.8 C) (Oral)   Ht 5\' 1"  (1.549 m)   Wt 161 lb 3.2 oz (73.1 kg)   LMP  (LMP Unknown)   SpO2 97%   BMI 30.46 kg/m   SpO2: 97 % O2 Device: Nasal cannula O2 Flow Rate (L/min): 2 L/min O2 Type: Pulse O2  GENERAL: Awake alert, no distress.  Presents in transport chair, comfortable on oxygen via POC.   HEAD: Normocephalic, atraumatic.  Wears hearing aids. EYES: Pupils equal, round, reactive to light.  No scleral icterus.  MOUTH: Very poor dentition, oral mucosa moist. NECK: Supple.  Thyroid diffusely enlarged.  Deviated to the right. No JVD.  No adenopathy. PULMONARY: Distant breath sounds, coarse, no other adventitious sounds.Marland Kitchen CARDIOVASCULAR: S1 and S2. Regular rate and rhythm.  Loud holosystolic murmur grade 4/6 best heard at the left sternal border  ABDOMEN: Soft, nondistended, nontender. MUSCULOSKELETAL: Very severe kyphoscoliosis. NEUROLOGIC: No focal deficits. SKIN: Intact,warm,dry.  No overt rashes noted. PSYCH: Flat affect, normal behavior      Assessment & Plan:     ICD-10-CM   1. Severe pulmonary hypertension (HCC)  I27.20 CANCELED: ECHOCARDIOGRAM COMPLETE  Due to chronic hypoxic vasoconstriction Severe restrictive physiology due to severe kyphoscoliosis Continue oxygen supplementation Patient at end-stage    2. COPD, severe (HCC)  J44.9    Continue Stiolto Continue as needed albuterol    3. Chronic respiratory failure with hypoxia (HCC)  J96.11    Patient compliant with oxygen supplementation Continue same    4. severe restrictive pulmonary physiology due to severe kyphoscoliosis  R94.2    This has complexity to her management    5. OSA (obstructive sleep  apnea)  G47.33    Compliant with CPAP Continue CPAP     Meds ordered this encounter  Medications   Tiotropium Bromide-Olodaterol (STIOLTO RESPIMAT) 2.5-2.5 MCG/ACT AERS    Sig: Inhale 2.5 mcg into the lungs 2 (two) times daily.    Dispense:  4 g    Refill:  0    Lot Number?:   [id number]    Expiration Date?:   06/19/2022    Quantity:   4   albuterol (VENTOLIN HFA) 108 (90 Base) MCG/ACT inhaler    Sig: Inhale 2 puffs into the lungs every 6 (six) hours as needed for wheezing or shortness of breath.    Dispense:  16 g    Refill:  3   Will see the patient in follow-up in 3 months time she is to contact us prior to that time should any new difficulties arise.   Gailen Shelter, MD Advanced Bronchoscopy P[facility]    *This note was generated using voice recognition software/Dragon and/or AI transcription program.  Despite best efforts to proofread, errors can occur which can change the meaning. Any transcriptional errors that result from this process are unintentional and may not be fully corrected at the time of dictation.

## 2021-06-18 NOTE — Progress Notes (Signed)
Patient seen in the office today and instructed on use of Stiolto.  Patient expressed understanding and demonstrated technique. 

## 2021-06-19 ENCOUNTER — Inpatient Hospital Stay: Payer: Medicare Other

## 2021-06-19 VITALS — BP 143/70 | HR 75 | Temp 98.0°F | Resp 76

## 2021-06-19 DIAGNOSIS — I10 Essential (primary) hypertension: Secondary | ICD-10-CM | POA: Diagnosis not present

## 2021-06-19 DIAGNOSIS — Z87891 Personal history of nicotine dependence: Secondary | ICD-10-CM | POA: Diagnosis not present

## 2021-06-19 DIAGNOSIS — Z79899 Other long term (current) drug therapy: Secondary | ICD-10-CM | POA: Diagnosis not present

## 2021-06-19 DIAGNOSIS — D631 Anemia in chronic kidney disease: Secondary | ICD-10-CM

## 2021-06-19 DIAGNOSIS — E538 Deficiency of other specified B group vitamins: Secondary | ICD-10-CM | POA: Diagnosis not present

## 2021-06-19 DIAGNOSIS — J449 Chronic obstructive pulmonary disease, unspecified: Secondary | ICD-10-CM | POA: Diagnosis not present

## 2021-06-19 DIAGNOSIS — N183 Chronic kidney disease, stage 3 unspecified: Secondary | ICD-10-CM | POA: Diagnosis not present

## 2021-06-19 DIAGNOSIS — R5383 Other fatigue: Secondary | ICD-10-CM | POA: Diagnosis not present

## 2021-06-19 DIAGNOSIS — E611 Iron deficiency: Secondary | ICD-10-CM | POA: Diagnosis not present

## 2021-06-19 MED ORDER — IRON SUCROSE 20 MG/ML IV SOLN
200.0000 mg | Freq: Once | INTRAVENOUS | Status: AC
  Administered 2021-06-19: 200 mg via INTRAVENOUS

## 2021-06-19 MED ORDER — SODIUM CHLORIDE 0.9 % IV SOLN: 200.0000 mg | INTRAVENOUS | Status: DC

## 2021-06-19 MED ORDER — SODIUM CHLORIDE 0.9 % IV SOLN
Freq: Once | INTRAVENOUS | Status: AC
  Filled 2021-06-19: qty 250

## 2021-06-20 DIAGNOSIS — G4733 Obstructive sleep apnea (adult) (pediatric): Secondary | ICD-10-CM | POA: Diagnosis not present

## 2021-06-26 DIAGNOSIS — G4733 Obstructive sleep apnea (adult) (pediatric): Secondary | ICD-10-CM | POA: Diagnosis not present

## 2021-07-02 ENCOUNTER — Telehealth: Payer: Medicare Other | Admitting: General Practice

## 2021-07-02 ENCOUNTER — Telehealth: Payer: Self-pay

## 2021-07-02 ENCOUNTER — Ambulatory Visit (INDEPENDENT_AMBULATORY_CARE_PROVIDER_SITE_OTHER): Payer: Medicare Other

## 2021-07-02 DIAGNOSIS — E782 Mixed hyperlipidemia: Secondary | ICD-10-CM

## 2021-07-02 DIAGNOSIS — N1832 Chronic kidney disease, stage 3b: Secondary | ICD-10-CM

## 2021-07-02 DIAGNOSIS — D631 Anemia in chronic kidney disease: Secondary | ICD-10-CM

## 2021-07-02 DIAGNOSIS — E538 Deficiency of other specified B group vitamins: Secondary | ICD-10-CM

## 2021-07-02 DIAGNOSIS — N1831 Chronic kidney disease, stage 3a: Secondary | ICD-10-CM

## 2021-07-02 DIAGNOSIS — I10 Essential (primary) hypertension: Secondary | ICD-10-CM

## 2021-07-02 DIAGNOSIS — J449 Chronic obstructive pulmonary disease, unspecified: Secondary | ICD-10-CM

## 2021-07-02 NOTE — Chronic Care Management (AMB) (Signed)
Chronic Care Management   CCM RN Visit Note  07/02/2021 Name: Madison Franklin MRN: [id number] DOB: Mar 19, 1946  Subjective: Madison Franklin is a 75 y.o. year old female who is a primary care patient of Cannady, Barbaraann Faster, NP. The care management team was consulted for assistance with disease management and care coordination needs.    Engaged with patient by telephone for follow up visit in response to provider referral for case management and/or care coordination services.   Consent to Services:  The patient was given information about Chronic Care Management services, agreed to services, and gave verbal consent prior to initiation of services.  Please see initial visit note for detailed documentation.   Patient agreed to services and verbal consent obtained.   Assessment: Review of patient past medical history, allergies, medications, health status, including review of consultants reports, laboratory and other test data, was performed as part of comprehensive evaluation and provision of chronic care management services.   SDOH (Social Determinants of Health) assessments and interventions performed:  SDOH Interventions    Flowsheet Row Most Recent Value  SDOH Interventions   Financial Strain Interventions Intervention Not Indicated  Housing Interventions Intervention Not Indicated  Intimate Partner Violence Interventions Intervention Not Indicated  Physical Activity Interventions Other (Comments)  [No structured activity. Husband states she gets around but doesn't do a lot]  Stress Interventions Intervention Not Indicated  Social Connections Interventions Intervention Not Indicated  Transportation Interventions Intervention Not Indicated        CCM Care Plan  Allergies  Allergen Reactions   Latex Itching and Rash    With latex gloves (when worn)    Outpatient Encounter Medications as of 07/02/2021  Medication Sig   acetaminophen (TYLENOL) 325 MG tablet Take 325 mg by mouth every 6  (six) hours as needed (for pain.).   albuterol (VENTOLIN HFA) 108 (90 Base) MCG/ACT inhaler Inhale 2 puffs into the lungs every 6 (six) hours as needed for wheezing or shortness of breath.   amLODipine (NORVASC) 5 MG tablet Take 1 tablet (5 mg total) by mouth daily.   aspirin EC 81 MG tablet Take 81 mg by mouth daily.   atorvastatin (LIPITOR) 20 MG tablet Take 1 tablet (20 mg total) by mouth daily.   hydrochlorothiazide (MICROZIDE) 12.5 MG capsule Take 1 capsule (12.5 mg total) by mouth daily.   lisinopril (ZESTRIL) 40 MG tablet Take 1 tablet (40 mg total) by mouth daily.   Tiotropium Bromide Monohydrate (SPIRIVA RESPIMAT) 2.5 MCG/ACT AERS Inhale 2 puffs into the lungs daily.   Tiotropium Bromide-Olodaterol (STIOLTO RESPIMAT) 2.5-2.5 MCG/ACT AERS Inhale 2.5 mcg into the lungs 2 (two) times daily.   Tiotropium Bromide-Olodaterol 2.5-2.5 MCG/ACT AERS Inhale 2 puffs into the lungs daily.   vitamin B-12 (CYANOCOBALAMIN) 1000 MCG tablet Take 1,000 mcg by mouth daily.   Vitamin D, Cholecalciferol, 1000 units TABS Take 1 tablet by mouth daily.   No facility-administered encounter medications on file as of 07/02/2021.    Patient Active Problem List   Diagnosis Date Noted   Mold exposure 03/20/2021   OSA (obstructive sleep apnea) 03/20/2021   IFG (impaired fasting glucose) 03/20/2021   Aortic atherosclerosis ([place]) 07/29/2020   Chronic respiratory failure with hypoxia ([place]) 07/29/2020   severe restrictive pulmonary physiology due to severe kyphoscoliosis 07/04/2020   Porcelain gallbladder 11/30/2019   Pulmonary arterial hypertension ([place]) 10/02/2019   Intestinal metaplasia of gastric mucosa    Chronic atrophic gastritis without bleeding 12/15/2018   B12 deficiency 08/04/2018   Nodule  of upper lobe of left lung 05/26/2018   Anemia in chronic kidney disease 05/26/2018   Coronary artery calcification 06/18/2017   Heart murmur 04/13/2017   Chronic kidney disease, stage 3 ([place]) 04/13/2017    Advanced care planning/counseling discussion 01/05/2017   Hx of colonic polyps    Benign neoplasm of cecum    Osteopenia 07/26/2015   COPD, severe ([place]) 07/26/2015   Essential hypertension 07/26/2015   Hyperlipidemia 07/26/2015    Conditions to be addressed/monitored:HTN, HLD, COPD, CKD Stage 3, and Anemia and Vitamin B12 deficiency   Care Plan : RNCM: HLD Management  Updates made by Vanita Ingles, RN since 07/02/2021 12:00 AM     Problem: RNCM: HLD Management   Priority: Medium     Long-Range Goal: RNCM: HLD   Start Date: 11/06/2020  Expected End Date: 04/08/2022  This Visit's Progress: On track  Priority: Medium  Note:   Current Barriers:  Poorly controlled hyperlipidemia, complicated by HTN, COPD  Current antihyperlipidemic regimen: Lipitor 20 mg Most recent lipid panel:  Lab Results  Component Value Date   CHOL 192 04/20/2020   CHOL 181 11/24/2017   CHOL 198 04/13/2017   Lab Results  Component Value Date   HDL 52 04/20/2020   HDL 51 11/24/2017   HDL 62 04/13/2017   Lab Results  Component Value Date   LDLCALC 105 (H) 04/20/2020   LDLCALC 102 (H) 11/24/2017   LDLCALC 108 (H) 04/13/2017   Lab Results  Component Value Date   TRIG 205 (H) 04/20/2020   TRIG 142 11/24/2017   TRIG 139 04/13/2017   No results found for: CHOLHDL No results found for: LDLDIRECT  ASCVD risk enhancing conditions: age >25,  HTN, CKD, former smoker Unable to independently manage HLD Lacks social connections Does not contact provider office for questions/concerns  RN Care Manager Clinical Goal(s):  patient will work with Consulting civil engineer, providers, and care team towards execution of optimized self-health management plan  patient will verbalize understanding of plan for effective management of HLD patient will attend all scheduled medical appointments: 09/19/2021  Interventions: Collaboration with Venita Lick, NP regarding development and update of comprehensive plan of care as  evidenced by provider attestation and co-signature Inter-disciplinary care team collaboration (see longitudinal plan of care) Medication review performed; medication list updated in electronic medical record.  Inter-disciplinary care team collaboration (see longitudinal plan of care) Referred to pharmacy team for assistance with HLD medication management Evaluation of current treatment plan related to HLD and patient's adherence to plan as established by provider.  07-02-2021:  Patient is compliant with medications.  Patient denies any issues or concerns. Will have an ultrasound of her abdomen on 07-05-2021 for yearly evaluation of porcelain gallbladder.  The patient denies any issues related to gallbladder or pain in abdomen.  Advised patient to call the office for changes in condition or questions Reviewed scheduled/upcoming provider appointments including: 09-19-2021 at 10:00 am   Patient Goals/Self-Care Activities: Over the next 120 days, patient will:   - call for medicine refill 2 or 3 days before it runs out - call if I am sick and can't take my medicine - keep a list of all the medicines I take; vitamins and herbals too - learn to read medicine labels - use a pillbox to sort medicine - use an alarm clock or phone to remind me to take my medicine - change to whole grain breads, cereal, pasta - drink 6 to 8 glasses of water each day -  eat 5 or 6 small meals each day - limit fast food meals to no more than 1 per week - prepare main meal at home 3 to 5 days each week - read food labels for fat, fiber, carbohydrates and portion size - be open to making changes - I can manage, know and watch for signs of a heart attack - if I have chest pain, call for help - learn about small changes that will make a big difference - learn my personal risk factors - education plan reviewed and/or amended - empathy and reassurance conveyed - family/caregiver participation in learning encouraged - health  literacy screen reviewed - patient's preferred learning methods utilized - questions encouraged - readiness to learn monitored  Follow Up Plan: Telephone follow up appointment with care management team member scheduled for: 09-03-2021 at 1:45pm      Task: Identify Deficit and [place] Completed 07/02/2021  Outcome: Positive  Note:   Care Management Activities:    - education plan reviewed and/or amended - empathy and reassurance conveyed - family/caregiver participation in learning encouraged - health literacy screen reviewed - patient's preferred learning methods utilized - questions encouraged - readiness to learn monitored        Care Plan : RNCM: Hypertension (Adult)  Updates made by Vanita Ingles, RN since 07/02/2021 12:00 AM     Problem: RNCM: Hypertension (Hypertension)   Priority: Medium     Long-Range Goal: RNCM: Hypertension Monitored   Start Date: 11/06/2020  Expected End Date: 04/08/2022  This Visit's Progress: On track  Priority: Medium  Note:    Objective:  Last practice recorded BP readings:  BP Readings from Last 3 Encounters:  07/02/21 136/73  06/19/21 (!) 143/70  06/18/21 130/64    Most recent eGFR/CrCl: No results found for: EGFR  No components found for: CRCL Current Barriers:  Knowledge Deficits related to basic understanding of hypertension pathophysiology and self care management Knowledge Deficits related to understanding of medications prescribed for management of hypertension Limited Social Support Unable to independently manage HTN Unable to self administer medications as prescribed Does not contact provider office for questions/concerns Case Manager Clinical Goal(s):  patient will verbalize understanding of plan for hypertension management  patient will attend all scheduled medical appointments: 09-19-2021 patient will demonstrate improved adherence to prescribed treatment plan for hypertension as evidenced by taking all  medications as prescribed, monitoring and recording blood pressure as directed, adhering to low sodium/DASH diet  patient will demonstrate improved health management independence as evidenced by checking blood pressure as directed and notifying PCP if SBP>160 or DBP > 90, taking all medications as prescribe, and adhering to a low sodium diet as discussed. Interventions:  Collaboration with Venita Lick, NP regarding development and update of comprehensive plan of care as evidenced by provider attestation and co-signature Inter-disciplinary care team collaboration (see longitudinal plan of care) Evaluation of current treatment plan related to hypertension self management and patient's adherence to plan as established by provider. 04/02/2021 Patient monitoring blood pressure once a week at home.  Yesterday's blood pressure was 133/73.  Patient denies any issues or concerns. 07-02-2021: The patient continues to check blood pressures at home. States it is good at home. Does have "white coat syndrome" and her pressure goes up sometimes when she has appointments. States she feels better since iron infusions. Will have a new ECHO on 07-18-2021 for evaluation of EF% and heart function. Denies any new issues. Husband states she is eating well and no  new concerns related to cardiac health and well being.  Provided education to patient re: stroke prevention, s/s of heart attack and stroke, DASH diet, complications of uncontrolled blood pressure Reviewed medications with patient and discussed importance of compliance,. 07-02-2021: Is compliant with medications. Denies any issues with cost or obtaining medications.  Discussed plans with patient for ongoing care management follow up and provided patient with direct contact information for care management team Advised patient, providing education and rationale, to monitor blood pressure daily and record, calling PCP for findings outside established parameters.  Reviewed  scheduled/upcoming provider appointments including: 09-19-2021 at 10:00am Patient Goals/Self-Care Activities Over the next 120 days, patient will:  - UNABLE to independently manage HTN Self administers medications as prescribed Attends all scheduled provider appointments Calls provider office for new concerns, questions, or BP outside discussed parameters Checks BP and records as discussed Follows a low sodium diet/DASH diet - blood pressure trends reviewed - depression screen reviewed - home or ambulatory blood pressure monitoring encouraged Follow Up Plan: Telephone follow up appointment with care management team member scheduled for: 09-03-2021 at 1:45 pm    Task: RNCM: Identify and Monitor Blood Pressure Elevation Completed 07/02/2021  Outcome: Positive  Note:   Care Management Activities:    - blood pressure trends reviewed - depression screen reviewed - home or ambulatory blood pressure monitoring encouraged        Care Plan : RNCM: COPD (Adult)  Updates made by Vanita Ingles, RN since 07/02/2021 12:00 AM     Problem: RNCM: Psychological Adjustment to Diagnosis (COPD)   Priority: Medium     Long-Range Goal: RNCM: COPD Adjustment to Disease Achieved   Start Date: 11/06/2020  Expected End Date: 04/08/2022  This Visit's Progress: On track  Priority: Medium  Note:   Current Barriers:  Knowledge deficits related to basic understanding of COPD disease process Knowledge deficits related to basic COPD self care/management Knowledge deficit related to basic understanding of how to use inhalers and how inhaled medications work Knowledge deficit related to importance of energy conservation Limited Social Support Unable to independently manage COPD Lacks social connections Does not contact provider office for questions/concerns  Case Manager Clinical Goal(s): patient will report using inhalers as prescribed including rinsing mouth after use Over the next 120 days patient  will report utilizing pursed lip breathing for shortness of breath  patient will be able to verbalize understanding of COPD action plan and when to seek appropriate levels of medical care patient will engage in lite exercise as tolerated to build/regain stamina and strength and reduce shortness of breath through activity tolerance  patient will verbalize basic understanding of COPD disease process and self care activities patient will not be hospitalized for COPD exacerbation  Interventions:  Collaboration with Venita Lick, NP regarding development and update of comprehensive plan of care as evidenced by provider attestation and co-signature Inter-disciplinary care team collaboration (see longitudinal plan of care) Provided patient with basic written and verbal COPD education on self care/management/and exacerbation prevention. 04/02/2021:  Patient started on O2 supplementation at 2 Liters/min via nasal cannula on 07/04/20. Patient reports using O2 "most of the time" and helps her breathing a great deal. 07-02-2021: Continues to use oxygen. Denies any issues with breathing at this time. Provided patient with COPD action plan and reinforced importance of daily self assessment. 07-02-2021: The patients husband is very active in the patients care. He helps her manage her conditions. States that her breathing has been stable for the most  part. Notes sometimes it may be a little worse. Feels like the iron infusions have helped as well.  Discussed Pulmonary Rehab and offered to assist with referral placement Provided written and verbal instructions on pursed lip breathing and utilized returned demonstration as teach back Provided instruction about proper use of medications used for management of COPD including inhalers Advised patient to self assesses COPD action plan zone and make appointment with provider if in the yellow zone for 48 hours without improvement. Provided patient with education about the  role of exercise in the management of COPD Advised patient to engage in light exercise as tolerated 3-5 days a week Provided education about and advised patient to utilize infection prevention strategies to reduce risk of respiratory infection. 07-02-2021: Denies any respiratory infections at this time. Knows how to monitor for changes. The patient is stable at this time.  Patient Goals/Self-Care Activities:  - depression screen reviewed - emotional support provided - family involvement promoted - problem-solving facilitated - relaxation techniques promoted - verbalization of feelings encouraged Follow Up Plan: Telephone follow up appointment with care management team member scheduled for: 09-03-2021 at 1:45 pm    Task: RNCM: Support Psychosocial Response to Chronic Obstructive Pulmonary Disease Completed 07/02/2021  Outcome: Positive  Note:   Care Management Activities:    - depression screen reviewed - emotional support provided - family involvement promoted - problem-solving facilitated - relaxation techniques promoted - verbalization of feelings encouraged    Care Plan : RNCM: CKD and anemia  Updates made by Vanita Ingles, RN since 07/02/2021 12:00 AM     Problem: RNCM: CKD and anemia   Priority: High  Onset Date: 07/02/2021     Long-Range Goal: RNCM: CKD and Anemia   Start Date: 07/02/2021  Expected End Date: 07/02/2022  This Visit's Progress: On track  Priority: High  Note:   Current Barriers:  Ineffective Self Health Maintenance in a patient with CKD Stage 3 and anemia Unable to independently manage CKD3 as evidence of anemia and Vitamin B12 deficiency and receiving infusions x 5 of Venofer Clinical Goal(s):  Collaboration with Venita Lick, NP regarding development and update of comprehensive plan of care as evidenced by provider attestation and co-signature Inter-disciplinary care team collaboration (see longitudinal plan of care) patient will work with care  management team to address care coordination and chronic disease management needs related to Disease Management Educational Needs Care Coordination   Interventions:  Evaluation of current treatment plan related to CKD Stage 3 and anemia, self-management and patient's adherence to plan as established by provider. 07-02-2021: The patient has received 5 infusions of Venofer due to CKD3 with anemia and Vitamin B12 deficiency. The patients ferritin was at 29 and Iron saturation was at 11%. The patient and the patients husband feel like the infusions have helped the patient feel better and have seen an overall improvement in her conditions. She will have further testing and recommendations in the coming months.  Collaboration with Venita Lick, NP regarding development and update of comprehensive plan of care as evidenced by provider attestation       and co-signature Inter-disciplinary care team collaboration (see longitudinal plan of care) Discussed plans with patient for ongoing care management follow up and provided patient with direct contact information for care management team Self Care Activities:  Patient verbalizes understanding of plan to effectively manage CKD3 with anemia and Vitamin B12 deficiency  Self administers medications as prescribed Attends all scheduled provider appointments Calls pharmacy for medication  refills Attends church or other social activities Performs ADL's independently Performs IADL's independently Calls provider office for new concerns or questions Patient Goals: - activity or exercise based on tolerance encouraged - barriers to lifestyle change identified - barriers to treatment adherence identified - counseling by pharmacist provided - difficulty of making life-long changes acknowledged - healthy lifestyle promoted - individualized medical nutrition therapy provided - medication side effects managed - response to pharmacologic therapy monitored -  self-awareness of signs/symptoms of worsening disease encouraged Follow Up Plan: Telephone follow up appointment with care management team member scheduled for: 09-03-2021 at 145 pm    Task: RNCM: Alleviate Barriers to Chronic Kidney Disease Treatment Completed 07/02/2021  Outcome: Positive  Note:   Care Management Activities:    - activity or exercise based on tolerance encouraged - barriers to lifestyle change identified - barriers to treatment adherence identified - counseling by pharmacist provided - difficulty of making life-long changes acknowledged - healthy lifestyle promoted - individualized medical nutrition therapy provided - medication side effects managed - response to pharmacologic therapy monitored - self-awareness of signs/symptoms of worsening disease encouraged         Plan:Telephone follow up appointment with care management team member scheduled for:  09-03-2021 at 1:45 pm  Noreene Larsson RN, MSN, [facility] Mobile: [phone]

## 2021-07-02 NOTE — Patient Instructions (Signed)
Visit Information  PATIENT GOALS:  Goals Addressed             This Visit's Progress    RNCM - Hypertension Monitored and Managed       Timeframe:  Short-Term Goal Priority:  Medium Start Date:  04-02-2021                           Expected End Date:  04-02-2022      Follow Up Date: 09-03-2021           BP Readings from Last 3 Encounters:  06/19/21 (!) 143/70  06/18/21 130/64  06/13/21 (!) 150/74     Patient Goals: - Self administers medications as prescribed - Attends all scheduled provider appointments - Calls provider office for new concerns, questions, or BP outside discussed parameters - Checks BP and records as discussed - Follows a low sodium diet/DASH diet - blood pressure trends reviewed - depression screen reviewed - home or ambulatory blood pressure monitoring encouraged        07-02-2021: The patient had elevated blood pressures in the MD office last month. States she gets excited when she goes to the doctor. States her blood pressure today at home when she took it was 136/73.  Denies any issues related to HTN at this time. Will continue to monitor.      RNCM - Management of COPD       Timeframe:  Short-Term Goal Priority:  Medium Start Date:    04-02-2021                         Expected End Date:   04-02-2022  Follow Up Date: 09-03-2021        Patient Goals: - depression screen reviewed - emotional support provided - family involvement promoted - problem-solving facilitated - relaxation techniques promoted - verbalization of feelings encouraged                   Notes: 07-02-2021: The patients husband states for the most part the patient is doing well with her COPD. Does have some days her breathing is not as good. The patient was resting at the time of the call. Denies any new concerns with respiratory health.      RNCM - Management of HLD       Timeframe:  Short-Term Goal Priority:  Medium Start Date:       04-02-2021                       Expected End Date:    04-02-2022                   Follow Up Date: 09-03-2021  Patient Goals: - call for medicine refill 2 or 3 days before it runs out - call if I am sick and can't take my medicine - keep a list of all the medicines I take; vitamins and herbals too - learn to read medicine labels - use a pillbox to sort medicine - use an alarm clock or phone to remind me to take my medicine - change to whole grain breads, cereal, pasta - drink 6 to 8 glasses of water each day - eat 5 or 6 small meals each day - limit fast food meals to no more than 1 per week - prepare main meal at home 3 to 5 days each week - be open  to making changes - I can manage, know and watch for signs of a heart attack - if I have chest pain, call for help - learn about small changes that will make a big difference - read food labels for fat, fiber, carbohydrates and portion size - learn my personal risk factors - education plan reviewed and/or amended - empathy and reassurance conveyed - family/caregiver participation in learning encouraged - health literacy screen reviewed - patient's preferred learning methods utilized - questions encouraged - readiness to learn monitored    Notes: 07-02-2021: The patients husband states that she is eating good and no new concerns with HLD. Will have [place] of abdomen on 07-05-2021 to evaluate for porcelain gallbladder     RNCM: Follow My Treatment Plan-Chronic Kidney       Timeframe:  Long-Range Goal Priority:  High Start Date:            07-02-2021                 Expected End Date:            07-02-2022           Follow Up Date 09/03/2021    - ask for help if I can't afford my medicines - avoid negative self-talk - call for medicine refill 2 or 3 days before it runs out - call the doctor or nurse before I stop taking medicine - call the doctor or nurse to get help with side effects - join a support group - keep follow-up appointments - keep taking my medicines,  even when I feel good - set alarm to remind to take medications    Why is this important?   Staying as healthy as you can is very important. This may mean making changes if you smoke, don't exercise or eat poorly.  A healthy lifestyle is an important goal for you.  Following the treatment plan and making changes may be hard.  Try some of these steps to help keep the disease from getting worse.     Notes: 07-02-2021: The patient has taken 5 infusions of Venofer for ferritin low at 29 and Iron saturation at 11%. Will have follow up blood work and further recommendations in the following months. Patient states the iron infusions have helped her a lot.     COMPLETED: RNCM: We try to do what is right and take care of each other       Current Barriers: Closing this care plan and opening in new ELS Chronic Disease Management support, education, and care coordination needs related to HTN, HLD, COPD, CKD Stage 3, and Nodule of Upper lobe left lung  Clinical Goal(s) related to HTN, HLD, COPD, CKD Stage 3, and nodule of upper lobe left lung :  Over the next 120 days, patient will:  Work with the care management team to address educational, disease management, and care coordination needs  Begin or continue self health monitoring activities as directed today Measure and record blood pressure 5 times per week Call provider office for new or worsened signs and symptoms Blood pressure findings outside established parameters, Oxygen saturation lower than established parameter, Shortness of breath, and New or worsened symptom related to CKD3/HLD/Nodule of Upper lobe left lung Call care management team with questions or concerns Verbalize basic understanding of patient centered plan of care established today  Interventions related to HTN, HLD, COPD, CKD Stage 3, and Nodule of upper lobe left lung :  Evaluation of current treatment  plans and patient's adherence to plan as established by provider. Per the patients  husband the patient has completed her treatments for lung nodule.  Denies any new concerns with her health at this time  Assessed patient understanding of disease states.  04-18-2020: The patients husband states he thinks the patient may need to come in for evaluation. He has noticed that the patient easily gets short of breath when doing any type of activity. Sometimes he lets her use his oxygen. He feels the patient needs to be evaluated for oxygen. He does have a pulse ox device and does check her oxygen levels at times. The patients husband says depending on what she is doing and how she feels her levels are 87 to 91.  Education on worsening s/s of condition and when to seek emergent care. The patients husband verbalized understanding. Will send an in basket message to front staff asking for staff to schedule an appointment to see the pcp. Will also let pcp know of new concern with shortness of breath.  Assessed patient's education and care coordination needs.  04-18-2020: Education and support given to the patient and patients husband on changes in condition and calling pcp for needs. Provided disease specific education to patient, patient states she does not use sodium in her diet  Collaborated with appropriate clinical care team members regarding patient needs Evaluation of blood pressure readings. The patient is taking almost daily and recording. Had not taken blood pressure today and could not readily provide readings to the [facility]. 04-18-2020: Per the husband they take her blood pressure daily and record. At the time of the call the patient had not taken her blood pressure today and could not provide readings. The husband states it has been "good". Evaluated activity level. The patient is not doing a lot of activity per her husband. Some but not as much as she should.  The patients husband states she does what she needs to do and rest when she needs to rest.  Evaluated upcoming appointments:  The patient  does not have any upcoming appointments, Review of CCM team availability to assist as needed with questions about health and well being. Will send an in basket message to get an appointment for evaluation with pcp.   Patient Self Care Activities related to HTN, HLD, COPD, CKD Stage 3, and Nodule of upper lobe left lung :  Patient is unable to independently self-manage chronic health conditions  Please see past updates related to this goal by clicking on the "Past Updates" button in the selected goal          Patient verbalizes understanding of instructions provided today and agrees to view in [place].   Telephone follow up appointment with care management team member scheduled for: 09-03-2021 at 1:45 pm  [place], MSN, [facility] Mobile: [phone]

## 2021-07-05 ENCOUNTER — Other Ambulatory Visit: Payer: Self-pay

## 2021-07-05 ENCOUNTER — Ambulatory Visit
Admission: RE | Admit: 2021-07-05 | Discharge: 2021-07-05 | Disposition: A | Discharge status: Home / Self Care | Payer: Medicare Other | Source: Ambulatory Visit | Attending: Surgery | Admitting: Surgery

## 2021-07-05 DIAGNOSIS — K828 Other specified diseases of gallbladder: Secondary | ICD-10-CM | POA: Diagnosis not present

## 2021-07-05 DIAGNOSIS — K8020 Calculus of gallbladder without cholecystitis without obstruction: Secondary | ICD-10-CM | POA: Diagnosis not present

## 2021-07-06 DIAGNOSIS — J449 Chronic obstructive pulmonary disease, unspecified: Secondary | ICD-10-CM | POA: Diagnosis not present

## 2021-07-11 DIAGNOSIS — J449 Chronic obstructive pulmonary disease, unspecified: Secondary | ICD-10-CM | POA: Diagnosis not present

## 2021-07-15 ENCOUNTER — Other Ambulatory Visit: Payer: Self-pay

## 2021-07-15 ENCOUNTER — Ambulatory Visit (INDEPENDENT_AMBULATORY_CARE_PROVIDER_SITE_OTHER): Payer: Medicare Other | Admitting: Surgery

## 2021-07-15 ENCOUNTER — Encounter: Payer: Self-pay | Admitting: Surgery

## 2021-07-15 VITALS — BP 176/74 | HR 88 | Temp 98.3°F | Ht 61.0 in | Wt 162.2 lb

## 2021-07-15 DIAGNOSIS — K828 Other specified diseases of gallbladder: Secondary | ICD-10-CM

## 2021-07-15 NOTE — Patient Instructions (Signed)
We will reach out to you in September 2023 to schedule Ultrasound of your gallbladder and follow up appointment with Dr.Pabon.

## 2021-07-18 ENCOUNTER — Other Ambulatory Visit: Payer: Medicare Other

## 2021-07-19 DIAGNOSIS — J449 Chronic obstructive pulmonary disease, unspecified: Secondary | ICD-10-CM | POA: Diagnosis not present

## 2021-07-19 DIAGNOSIS — N1832 Chronic kidney disease, stage 3b: Secondary | ICD-10-CM | POA: Diagnosis not present

## 2021-07-19 DIAGNOSIS — D631 Anemia in chronic kidney disease: Secondary | ICD-10-CM | POA: Diagnosis not present

## 2021-07-19 DIAGNOSIS — N1831 Chronic kidney disease, stage 3a: Secondary | ICD-10-CM

## 2021-07-19 DIAGNOSIS — I10 Essential (primary) hypertension: Secondary | ICD-10-CM

## 2021-07-19 DIAGNOSIS — E782 Mixed hyperlipidemia: Secondary | ICD-10-CM

## 2021-07-20 DIAGNOSIS — G4733 Obstructive sleep apnea (adult) (pediatric): Secondary | ICD-10-CM | POA: Diagnosis not present

## 2021-07-23 ENCOUNTER — Other Ambulatory Visit: Payer: Self-pay

## 2021-07-23 ENCOUNTER — Emergency Department
Admission: EM | Admit: 2021-07-23 | Discharge: 2021-07-23 | Disposition: A | Discharge status: Home / Self Care | Payer: Medicare Other | Attending: Student in an Organized Health Care Education/Training Program | Admitting: Student in an Organized Health Care Education/Training Program

## 2021-07-23 DIAGNOSIS — J449 Chronic obstructive pulmonary disease, unspecified: Secondary | ICD-10-CM | POA: Insufficient documentation

## 2021-07-23 DIAGNOSIS — Z91040 Latex allergy status: Secondary | ICD-10-CM | POA: Insufficient documentation

## 2021-07-23 DIAGNOSIS — H7291 Unspecified perforation of tympanic membrane, right ear: Secondary | ICD-10-CM

## 2021-07-23 DIAGNOSIS — H9201 Otalgia, right ear: Secondary | ICD-10-CM | POA: Diagnosis not present

## 2021-07-23 DIAGNOSIS — I129 Hypertensive chronic kidney disease with stage 1 through stage 4 chronic kidney disease, or unspecified chronic kidney disease: Secondary | ICD-10-CM | POA: Insufficient documentation

## 2021-07-23 DIAGNOSIS — N183 Chronic kidney disease, stage 3 unspecified: Secondary | ICD-10-CM | POA: Insufficient documentation

## 2021-07-23 DIAGNOSIS — Z85030 Personal history of malignant carcinoid tumor of large intestine: Secondary | ICD-10-CM | POA: Insufficient documentation

## 2021-07-23 DIAGNOSIS — Z79899 Other long term (current) drug therapy: Secondary | ICD-10-CM | POA: Insufficient documentation

## 2021-07-23 DIAGNOSIS — Z7982 Long term (current) use of aspirin: Secondary | ICD-10-CM | POA: Insufficient documentation

## 2021-07-23 DIAGNOSIS — Z87891 Personal history of nicotine dependence: Secondary | ICD-10-CM | POA: Diagnosis not present

## 2021-07-23 MED ORDER — AMOXICILLIN 500 MG PO CAPS: 500.0000 mg | ORAL_CAPSULE | Freq: Three times a day (TID) | ORAL | 0 refills | Status: AC

## 2021-07-23 NOTE — Discharge Instructions (Signed)
I am concerned you have a ruptured ear drum.  I have ordered a prescription for antibiotics to your pharmacy.  Please avoid putting anything, including water, in you ears.  It is important that you have close follow up with the Ear specialist.

## 2021-07-23 NOTE — ED Provider Notes (Signed)
[facility] Emergency Department Provider Note    Event Date/Time   First MD Initiated Contact with Patient 07/23/21 1055     (approximate)  I have reviewed the triage vital signs and the nursing notes.   HISTORY  Chief Complaint Otalgia    HPI Madison Franklin is a 75 y.o. female below listed past medical history presents to the ER for evaluation of right ear pain for the past several days.  States she been trying to clean it out and has noted some decreased hearing on that side but wears hearing aids bilaterally.  States that husband looked down and noticed that after they put some eardrops and a few days ago that a large amount of purulent material came out the ear.  No fevers.  States that she is having intermittent pain on that side.  Past Medical History:  Diagnosis Date   COPD (chronic obstructive pulmonary disease) ([place])    Dyspnea    Hearing aid worn    bilateral   Hyperlipidemia    Hypertension    Osteoporosis    Personal history of tobacco use, presenting hazards to health 08/15/2015   Pulmonary hypertension, mild (HCC)    mild to moderate   Family History  Problem Relation Age of Onset   Heart disease Neg Hx    Past Surgical History:  Procedure Laterality Date   ABDOMINAL HYSTERECTOMY     COLONOSCOPY WITH PROPOFOL N/A 11/14/2016   Procedure: COLONOSCOPY WITH PROPOFOL;  Surgeon: [name], MD;  Location: [place];  Service: Endoscopy;  Laterality: N/A;   COLONOSCOPY WITH PROPOFOL N/A 11/10/2018   Procedure: COLONOSCOPY WITH PROPOFOL;  Surgeon: Lin Landsman, MD;  Location: [place];  Service: Endoscopy;  Laterality: N/A;   ESOPHAGOGASTRODUODENOSCOPY (EGD) WITH PROPOFOL  11/10/2018   Procedure: ESOPHAGOGASTRODUODENOSCOPY (EGD) WITH PROPOFOL;  Surgeon: Lin Landsman, MD;  Location: [place];  Service: Endoscopy;;   ESOPHAGOGASTRODUODENOSCOPY (EGD) WITH PROPOFOL N/A 05/11/2019   Procedure:  ESOPHAGOGASTRODUODENOSCOPY (EGD) WITH PROPOFOL;  Surgeon: Lin Landsman, MD;  Location: [place];  Service: Endoscopy;  Laterality: N/A;   long nodule     POLYPECTOMY  11/14/2016   Procedure: POLYPECTOMY;  Surgeon: [name], MD;  Location: [place];  Service: Endoscopy;;   POLYPECTOMY  11/10/2018   Procedure: POLYPECTOMY;  Surgeon: Lin Landsman, MD;  Location: [place];  Service: Endoscopy;;   RIGHT HEART CATH Right 07/14/2017   Procedure: RIGHT HEART CATH;  Surgeon: Nelva Bush, MD;  Location: [place] [facility];  Service: Cardiovascular;  Laterality: Right;   Patient Active Problem List   Diagnosis Date Noted   Mold exposure 03/20/2021   OSA (obstructive sleep apnea) 03/20/2021   IFG (impaired fasting glucose) 03/20/2021   Aortic atherosclerosis ([place]) 07/29/2020   Chronic respiratory failure with hypoxia ([place]) 07/29/2020   severe restrictive pulmonary physiology due to severe kyphoscoliosis 07/04/2020   Porcelain gallbladder 11/30/2019   Pulmonary arterial hypertension ([place]) 10/02/2019   Intestinal metaplasia of gastric mucosa    Chronic atrophic gastritis without bleeding 12/15/2018   B12 deficiency 08/04/2018   Nodule of upper lobe of left lung 05/26/2018   Anemia in chronic kidney disease 05/26/2018   Coronary artery calcification 06/18/2017   Heart murmur 04/13/2017   Chronic kidney disease, stage 3 ([place]) 04/13/2017   Advanced care planning/counseling discussion 01/05/2017   Hx of colonic polyps    Benign neoplasm of cecum    Osteopenia 07/26/2015  COPD, severe ([place]) 07/26/2015   Essential hypertension 07/26/2015   Hyperlipidemia 07/26/2015      Prior to Admission medications   Medication Sig Start Date End Date Taking? Authorizing Provider  amoxicillin (AMOXIL) 500 MG capsule Take 1 capsule (500 mg total) by mouth 3 (three) times daily for 7 days. 07/23/21 07/30/21 Yes Merlyn Lot, MD  acetaminophen (TYLENOL)  325 MG tablet Take 325 mg by mouth every 6 (six) hours as needed (for pain.).    [provider]  albuterol (VENTOLIN HFA) 108 (90 Base) MCG/ACT inhaler Inhale 2 puffs into the lungs every 6 (six) hours as needed for wheezing or shortness of breath. 06/18/21   Tyler Pita, MD  amLODipine (NORVASC) 5 MG tablet Take 1 tablet (5 mg total) by mouth daily. 11/16/20   Marnee Guarneri T, NP  aspirin EC 81 MG tablet Take 81 mg by mouth daily.    [provider]  atorvastatin (LIPITOR) 20 MG tablet Take 1 tablet (20 mg total) by mouth daily. 11/14/20   Cannady, Henrine Screws T, NP  hydrochlorothiazide (MICROZIDE) 12.5 MG capsule Take 1 capsule (12.5 mg total) by mouth daily. 03/20/21   Cannady, Henrine Screws T, NP  lisinopril (ZESTRIL) 40 MG tablet Take 1 tablet (40 mg total) by mouth daily. 11/16/20   Cannady, Henrine Screws T, NP  Tiotropium Bromide Monohydrate (SPIRIVA RESPIMAT) 2.5 MCG/ACT AERS Inhale 2 puffs into the lungs daily. 03/20/21   Martyn Ehrich, NP  Tiotropium Bromide-Olodaterol (STIOLTO RESPIMAT) 2.5-2.5 MCG/ACT AERS Inhale 2.5 mcg into the lungs 2 (two) times daily. 06/18/21   Tyler Pita, MD  Tiotropium Bromide-Olodaterol 2.5-2.5 MCG/ACT AERS Inhale 2 puffs into the lungs daily. 04/27/20   Cannady, Henrine Screws T, NP  vitamin B-12 (CYANOCOBALAMIN) 1000 MCG tablet Take 1,000 mcg by mouth daily.    [provider]  Vitamin D, Cholecalciferol, 1000 units TABS Take 1 tablet by mouth daily.    [provider]    Allergies Latex    Social History Social History   Tobacco Use   Smoking status: Former    Packs/day: 0.50    Years: 30.00    Pack years: 15.00    Types: Cigarettes    Quit date: 01/17/2017    Years since quitting: 4.5   Smokeless tobacco: Never  Vaping Use   Vaping Use: Never used  Substance Use Topics   Alcohol use: No    Alcohol/week: 0.0 standard drinks   Drug use: No    Review of Systems Patient denies headaches, rhinorrhea, blurry vision,  numbness, shortness of breath, chest pain, edema, cough, abdominal pain, nausea, vomiting, diarrhea, dysuria, fevers, rashes or hallucinations unless otherwise stated above in HPI. ____________________________________________   PHYSICAL EXAM:  VITAL SIGNS: Vitals:   07/23/21 1034  BP: (!) 157/80  Pulse: 83  Resp: 15  Temp: 98.5 F (36.9 C)  SpO2: 95%    Constitutional: Alert and oriented. Well appearing and in no acute distress. Eyes: Conjunctivae are normal.  Head: Atraumatic. Nose: No congestion/rhinnorhea. Mouth/Throat: Mucous membranes are moist.  The right TM appears to be ruptured though visualization somewhat obscured due to fair amount of proteinaceous and mixed purulent material coming from the ear.  No mastoid tenderness. Neck: Painless ROM.  Cardiovascular:   Good peripheral circulation. Respiratory: Normal respiratory effort.  No retractions.  Gastrointestinal: Soft and nontender.  Musculoskeletal: No lower extremity tenderness .  No joint effusions. Neurologic:  Normal speech and language. No gross focal neurologic deficits are appreciated.  Skin:  Skin  is warm, dry and intact. No rash noted. Psychiatric: Mood and affect are normal. Speech and behavior are normal.  ____________________________________________   LABS (all labs ordered are listed, but only abnormal results are displayed)  No results found for this or any previous visit (from the past 24 hour(s)). ____________________________________________ ____________________________________________  [id number]   ____________________________________________   PROCEDURES  Procedure(s) performed:  Procedures    Critical Care performed: no ____________________________________________   INITIAL IMPRESSION / ASSESSMENT AND PLAN / ED COURSE  Pertinent labs & imaging results that were available during my care of the patient were reviewed by me and considered in my medical decision making (see chart for  details).   DDX: Otitis externa, otitis media, perforated eardrum.  Foreign body.  Malignant otitis  CAMAURI FLEECE is a 75 y.o. who presents to the ED with presentation as described above.  Patient nontoxic-appearing afebrile and hemodynamically stable.  Based on her exam I Georgina Peer cover with oral antibiotics due to concern for possible perforated TM.  Will give referral to ear nose and throat.  She already had an appointment with them but have relayed importance of close follow-up as well as to avoid introducing additional liquid or material into the ear.  Discussed strict return precautions patient family agreeable to plan.  The patient was evaluated in Emergency Department today for the symptoms described in the history of present illness. He/she was evaluated in the context of the global COVID-19 pandemic, which necessitated consideration that the patient might be at risk for infection with the SARS-CoV-2 virus that causes COVID-19. Institutional protocols and algorithms that pertain to the evaluation of patients at risk for COVID-19 are in a state of rapid change based on information released by regulatory bodies including the CDC and federal and state organizations. These policies and algorithms were followed during the patient's care in the ED.       ____________________________________________   FINAL CLINICAL IMPRESSION(S) / ED DIAGNOSES  Final diagnoses:  Right ear pain  Perforation of right tympanic membrane      NEW MEDICATIONS STARTED DURING THIS VISIT:  New Prescriptions   AMOXICILLIN (AMOXIL) 500 MG CAPSULE    Take 1 capsule (500 mg total) by mouth 3 (three) times daily for 7 days.     Note:  This document was prepared using Dragon voice recognition software and may include unintentional dictation errors.     Merlyn Lot, MD 07/23/21 1113

## 2021-07-23 NOTE — ED Triage Notes (Signed)
Pt here with an right earache that started Sunday. Pt describes the pain as throbbing. Pt denies injury.

## 2021-07-23 NOTE — Progress Notes (Signed)
Outpatient Surgical Follow Up  07/23/2021  Madison Franklin is an 75 y.o. female.   Chief Complaint  Patient presents with   Follow-up    gallbladder    HPI: 75 year old female well-known to me with a history of coarsening of gallbladder.  More importantly she does have chronic pulmonary disease and is on home oxygen. restrictive pulmonary issues  due to severe kyphosis. S He does have limited pulmonary performance.  I did obtain an ultrasound that have personally reviewed showing evidence of gallstones and a porcelain gallbladder., no major changes as compared to U/S from last year  There is no evidence of significant masses and there is no change when compared to prior exam.  Past Medical History:  Diagnosis Date   COPD (chronic obstructive pulmonary disease) ([place])    Dyspnea    Hearing aid worn    bilateral   Hyperlipidemia    Hypertension    Osteoporosis    Personal history of tobacco use, presenting hazards to health 08/15/2015   Pulmonary hypertension, mild (HCC)    mild to moderate    Past Surgical History:  Procedure Laterality Date   ABDOMINAL HYSTERECTOMY     COLONOSCOPY WITH PROPOFOL N/A 11/14/2016   Procedure: COLONOSCOPY WITH PROPOFOL;  Surgeon: [name], MD;  Location: [place];  Service: Endoscopy;  Laterality: N/A;   COLONOSCOPY WITH PROPOFOL N/A 11/10/2018   Procedure: COLONOSCOPY WITH PROPOFOL;  Surgeon: Lin Landsman, MD;  Location: [place];  Service: Endoscopy;  Laterality: N/A;   ESOPHAGOGASTRODUODENOSCOPY (EGD) WITH PROPOFOL  11/10/2018   Procedure: ESOPHAGOGASTRODUODENOSCOPY (EGD) WITH PROPOFOL;  Surgeon: Lin Landsman, MD;  Location: [place];  Service: Endoscopy;;   ESOPHAGOGASTRODUODENOSCOPY (EGD) WITH PROPOFOL N/A 05/11/2019   Procedure: ESOPHAGOGASTRODUODENOSCOPY (EGD) WITH PROPOFOL;  Surgeon: Lin Landsman, MD;  Location: [place];  Service: Endoscopy;  Laterality: N/A;   long nodule      POLYPECTOMY  11/14/2016   Procedure: POLYPECTOMY;  Surgeon: [name], MD;  Location[place];  Service: Endoscopy;;   POLYPECTOMY  11/10/2018   Procedure: POLYPECTOMY;  Surgeon: Lin Landsman, MD;  Location: [place];  Service: Endoscopy;;   RIGHT HEART CATH Right 07/14/2017   Procedure: RIGHT HEART CATH;  Surgeon: Nelva Bush, MD;  Location: [place] [facility];  Service: Cardiovascular;  Laterality: Right;    Family History  Problem Relation Age of Onset   Heart disease Neg Hx     Social History:  reports that she quit smoking about 4 years ago. Her smoking use included cigarettes. She has a 15.00 pack-year smoking history. She has never used smokeless tobacco. She reports that she does not drink alcohol and does not use drugs.  Allergies:  Allergies  Allergen Reactions   Latex Itching and Rash    With latex gloves (when worn)    Medications reviewed.    ROS Full ROS performed and is otherwise negative other than what is stated in HPI   BP (!) 176/74   Pulse 88   Temp 98.3 F (36.8 C) (Oral)   Ht 5\' 1"  (1.549 m)   Wt 162 lb 3.2 oz (73.6 kg)   LMP  (LMP Unknown)   SpO2 97%   BMI 30.65 kg/m   Physical Exam Vitals and nursing note reviewed. Exam conducted with a chaperone present.  Constitutional:      Comments: Chronically ill and fragile  Cardiovascular:     Rate and Rhythm: Normal rate.  Heart sounds:    No friction rub.  Pulmonary:     Effort: Pulmonary effort is normal. No respiratory distress.     Breath sounds: Normal breath sounds. No rhonchi.  Abdominal:     General: There is no distension.     Palpations: There is no mass.     Tenderness: There is no abdominal tenderness. There is no guarding or rebound.     Hernia: No hernia is present.  Musculoskeletal:     Cervical back: Normal range of motion and neck supple.  Skin:    General: Skin is warm.     Capillary Refill: Capillary refill takes less than 2 seconds.      Coloration: Skin is not jaundiced.  Neurological:     Mental Status: She is alert.  Psychiatric:        Mood and Affect: Mood normal.        Thought Content: Thought content normal.        Judgment: Judgment normal.       Assessment/Plan: [name] is a 75 year old female well-known to me with a history of porcelain gallbladder and cholelithiasis.  She continues to be asymptomatic from an abdominal perspective.  Discussed again about the findings of porcelain gallbladder and its association with gallbladder cancer.  She does have significant comorbidities with significant pulmonary disease, fragility and severe scoliosis / kyphosis with some ventilatory dynamic issues.  At this time she wishes to continue yearly follow-up with physical exam and ultrasound.  We will arrange for this to continue.  Greater than 50% of the 30 minutes  visit was spent in counseling/coordination of care   Caroleen Hamman, MD [place] [facility]

## 2021-08-05 DIAGNOSIS — J449 Chronic obstructive pulmonary disease, unspecified: Secondary | ICD-10-CM | POA: Diagnosis not present

## 2021-08-07 DIAGNOSIS — H9211 Otorrhea, right ear: Secondary | ICD-10-CM | POA: Diagnosis not present

## 2021-08-07 DIAGNOSIS — H6121 Impacted cerumen, right ear: Secondary | ICD-10-CM | POA: Diagnosis not present

## 2021-08-10 DIAGNOSIS — J449 Chronic obstructive pulmonary disease, unspecified: Secondary | ICD-10-CM | POA: Diagnosis not present

## 2021-08-20 DIAGNOSIS — G4733 Obstructive sleep apnea (adult) (pediatric): Secondary | ICD-10-CM | POA: Diagnosis not present

## 2021-08-21 ENCOUNTER — Other Ambulatory Visit: Payer: Medicare Other

## 2021-09-03 ENCOUNTER — Ambulatory Visit (INDEPENDENT_AMBULATORY_CARE_PROVIDER_SITE_OTHER): Payer: Medicare Other

## 2021-09-03 ENCOUNTER — Telehealth: Payer: Medicare Other | Admitting: General Practice

## 2021-09-03 DIAGNOSIS — H9211 Otorrhea, right ear: Secondary | ICD-10-CM | POA: Diagnosis not present

## 2021-09-03 DIAGNOSIS — H6121 Impacted cerumen, right ear: Secondary | ICD-10-CM | POA: Diagnosis not present

## 2021-09-03 DIAGNOSIS — I10 Essential (primary) hypertension: Secondary | ICD-10-CM

## 2021-09-03 DIAGNOSIS — E782 Mixed hyperlipidemia: Secondary | ICD-10-CM

## 2021-09-03 DIAGNOSIS — J449 Chronic obstructive pulmonary disease, unspecified: Secondary | ICD-10-CM

## 2021-09-03 DIAGNOSIS — N1831 Chronic kidney disease, stage 3a: Secondary | ICD-10-CM

## 2021-09-03 NOTE — Patient Instructions (Signed)
Visit Information  Current Barriers:  Knowledge Deficits related to plan of care for management of HTN, HLD, COPD, and CKD Stage 3  Chronic Disease Management support and education needs related to HTN, HLD, COPD, and CKD Stage 3  RNCM Clinical Goal(s):  Patient will verbalize understanding of plan for management of HTN, HLD, COPD, and CKD Stage 3 as evidenced by following the plan of care, calling the office for changes and working with the CCM team to optimize health and well being  take all medications exactly as prescribed and will call provider for medication related questions as evidenced by taking as prescribed and calling for refills before running out of medications     attend all scheduled medical appointments: 09-19-2021 at 10 am with the pcp  as evidenced by keeping appointments and calling the office for needed schedule changes         demonstrate a decrease in HTN, HLD, COPD, and CKD Stage 3 exacerbations  as evidenced by working with the CCM team and pcp to optimize health and well being demonstrate ongoing self health care management ability for effective management of chronic conditions  as evidenced by  working with the CCM team through collaboration with Consulting civil engineer, provider, and care team.   Interventions: 1:1 collaboration with primary care provider regarding development and update of comprehensive plan of care as evidenced by provider attestation and co-signature Inter-disciplinary care team collaboration (see longitudinal plan of care) Evaluation of current treatment plan related to  self management and patient's adherence to plan as established by provider   Chronic Kidney Disease (Status: Goal on Track (progressing): YES.)  Long Term Goal  Last practice recorded BP readings:  BP Readings from Last 3 Encounters:  07/23/21 (!) 157/80  07/15/21 (!) 176/74  07/02/21 136/73  Most recent eGFR/CrCl:  Lab Results  Component Value Date   EGFR 41 (L) 03/20/2021    No  components found for: CRCL  Assessed the patients spouse and DRP  understanding of chronic kidney disease    Evaluation of current treatment plan related to chronic kidney disease self management and patient's adherence to plan as established by provider      Provided education to patient re: stroke prevention, s/s of heart attack and stroke    Reviewed prescribed diet heart healthy/Renal  Reviewed medications with patient and discussed importance of compliance    Advised patient, providing education and rationale, to monitor blood pressure daily and record, calling PCP for findings outside established parameters    Discussed complications of poorly controlled blood pressure such as heart disease, stroke, circulatory complications, vision complications, kidney impairment, sexual dysfunction    Discussed plans with patient for ongoing care management follow up and provided patient with direct contact information for care management team    Screening for signs and symptoms of depression related to chronic disease state      Discussed the impact of chronic kidney disease on daily life and mental health and acknowledged and normalized feelings of disempowerment, fear, and frustration    Assessed social determinant of health barriers    Provided education on kidney disease progression    Engage patient in early, proactive and ongoing discussion about goals of care and what matters most to them    Support coping and stress management by recognizing current strategies and assist in developing new strategies such as mindfulness, journaling, relaxation techniques, problem-solving      COPD: (Status: Goal on Track (progressing): YES.) Long Term Goal  Reviewed medications with patient, including use of prescribed maintenance and rescue inhalers, and provided instruction on medication management and the importance of adherence Provided patient with basic written and verbal COPD education on self  care/management/and exacerbation prevention Advised patient to track and manage COPD triggers Provided written and verbal instructions on pursed lip breathing and utilized returned demonstration as teach back Provided instruction about proper use of medications used for management of COPD including inhalers Advised patient to self assesses COPD action plan zone and make appointment with provider if in the yellow zone for 48 hours without improvement Advised patient to engage in light exercise as tolerated 3-5 days a week to aid in the the management of COPD Provided education about and advised patient to utilize infection prevention strategies to reduce risk of respiratory infection Discussed the importance of adequate rest and management of fatigue with COPD Screening for signs and symptoms of depression related to chronic disease state  Assessed social determinant of health barriers  Hyperlipidemia:  (Status: Goal on Track (progressing): YES.) Long Term Goal  Lab Results  Component Value Date   CHOL 192 04/20/2020   HDL 52 04/20/2020   LDLCALC 105 (H) 04/20/2020   TRIG 205 (H) 04/20/2020     Medication review performed; medication list updated in electronic medical record.  Provider established cholesterol goals reviewed; Counseled on importance of regular laboratory monitoring as prescribed; Provided HLD educational materials; Reviewed role and benefits of statin for ASCVD risk reduction; Discussed strategies to manage statin-induced myalgias; Reviewed importance of limiting foods high in cholesterol;  Hypertension: (Status: Goal on Track (progressing): YES.) Last practice recorded BP readings:  BP Readings from Last 3 Encounters:  07/23/21 (!) 157/80  07/15/21 (!) 176/74  07/02/21 136/73  Most recent eGFR/CrCl:  Lab Results  Component Value Date   EGFR 41 (L) 03/20/2021    No components found for: CRCL  Evaluation of current treatment plan related to hypertension self  management and patient's adherence to plan as established by provider;   Provided education to patient re: stroke prevention, s/s of heart attack and stroke; Reviewed prescribed diet heart healthy/Renal  Reviewed medications with patient and discussed importance of compliance;  Discussed plans with patient for ongoing care management follow up and provided patient with direct contact information for care management team; Provided education on prescribed diet heart healthy- renal;  Discussed complications of poorly controlled blood pressure such as heart disease, stroke, circulatory complications, vision complications, kidney impairment, sexual dysfunction;   Patient Goals/Self-Care Activities: Patient will self administer medications as prescribed as evidenced by self report/primary caregiver report  Patient will attend all scheduled provider appointments as evidenced by clinician review of documented attendance to scheduled appointments and patient/caregiver report Patient will call pharmacy for medication refills as evidenced by patient report and review of pharmacy fill history as appropriate Patient will attend church or other social activities as evidenced by patient report Patient will continue to perform ADL's independently as evidenced by patient/caregiver report Patient will continue to perform IADL's independently as evidenced by patient/caregiver report Patient will call provider office for new concerns or questions as evidenced by review of documented incoming telephone call notes and patient report Patient will work with BSW to address care coordination needs and will continue to work with the clinical team to address health care and disease management related needs as evidenced by documented adherence to scheduled care management/care coordination appointments - avoid second hand smoke - eliminate smoking in my home - identify and remove  indoor air pollutants - limit outdoor  activity during cold weather - listen for public air quality announcements every day - develop a rescue plan - eliminate symptom triggers at home - follow rescue plan if symptoms flare-up - keep follow-up appointments: 09-19-2021 with pcp  - use an extra pillow to sleep - develop a new routine to improve sleep - don't eat or exercise right before bedtime - eat healthy/prescribed diet: heart healthy diet  - get at least 7 to 8 hours of sleep at night - use devices that will help like a cane, sock-puller or reacher - practice relaxation or meditation daily - do exercises in a comfortable position that makes breathing as easy as possible - check blood pressure weekly - choose a place to take my blood pressure (home, clinic or office, retail store) - write blood pressure results in a log or diary - learn about high blood pressure - take blood pressure log to all doctor appointments - call doctor for signs and symptoms of high blood pressure - develop an action plan for high blood pressure - keep all doctor appointments - take medications for blood pressure exactly as prescribed - begin an exercise program - report new symptoms to your doctor - eat more whole grains, fruits and vegetables, lean meats and healthy fats - call for medicine refill 2 or 3 days before it runs out - take all medications exactly as prescribed - call doctor with any symptoms you believe are related to your medicine - call doctor when you experience any new symptoms - go to all doctor appointments as scheduled - adhere to prescribed diet: Heart healthy diet        Patient verbalizes understanding of instructions provided today and agrees to view in [place].   Telephone follow up appointment with care management team member scheduled for: 11-05-2021 at 145 pm  Noreene Larsson RN, MSN, [facility] Mobile: [phone]

## 2021-09-03 NOTE — Chronic Care Management (AMB) (Signed)
Chronic Care Management   CCM RN Visit Note  09/03/2021 Name: LINDA GRIMMER MRN: [id number] DOB: 12/22/1945  Subjective: Madison Franklin is a 75 y.o. year old female who is a primary care patient of Cannady, Barbaraann Faster, NP. The care management team was consulted for assistance with disease management and care coordination needs.    Engaged with patient by telephone for follow up visit in response to provider referral for case management and/or care coordination services. Spoke to the patients husband, Elenore Rota  Consent to Services:  The patient was given information about Chronic Care Management services, agreed to services, and gave verbal consent prior to initiation of services.  Please see initial visit note for detailed documentation.   Patient agreed to services and verbal consent obtained.   Assessment: Review of patient past medical history, allergies, medications, health status, including review of consultants reports, laboratory and other test data, was performed as part of comprehensive evaluation and provision of chronic care management services.   SDOH (Social Determinants of Health) assessments and interventions performed:    CCM Care Plan  Allergies  Allergen Reactions   Latex Itching and Rash    With latex gloves (when worn)    Outpatient Encounter Medications as of 09/03/2021  Medication Sig   acetaminophen (TYLENOL) 325 MG tablet Take 325 mg by mouth every 6 (six) hours as needed (for pain.).   albuterol (VENTOLIN HFA) 108 (90 Base) MCG/ACT inhaler Inhale 2 puffs into the lungs every 6 (six) hours as needed for wheezing or shortness of breath.   amLODipine (NORVASC) 5 MG tablet Take 1 tablet (5 mg total) by mouth daily.   aspirin EC 81 MG tablet Take 81 mg by mouth daily.   atorvastatin (LIPITOR) 20 MG tablet Take 1 tablet (20 mg total) by mouth daily.   hydrochlorothiazide (MICROZIDE) 12.5 MG capsule Take 1 capsule (12.5 mg total) by mouth daily.   lisinopril (ZESTRIL)  40 MG tablet Take 1 tablet (40 mg total) by mouth daily.   Tiotropium Bromide Monohydrate (SPIRIVA RESPIMAT) 2.5 MCG/ACT AERS Inhale 2 puffs into the lungs daily.   Tiotropium Bromide-Olodaterol (STIOLTO RESPIMAT) 2.5-2.5 MCG/ACT AERS Inhale 2.5 mcg into the lungs 2 (two) times daily.   Tiotropium Bromide-Olodaterol 2.5-2.5 MCG/ACT AERS Inhale 2 puffs into the lungs daily.   vitamin B-12 (CYANOCOBALAMIN) 1000 MCG tablet Take 1,000 mcg by mouth daily.   Vitamin D, Cholecalciferol, 1000 units TABS Take 1 tablet by mouth daily.   No facility-administered encounter medications on file as of 09/03/2021.    Patient Active Problem List   Diagnosis Date Noted   Mold exposure 03/20/2021   OSA (obstructive sleep apnea) 03/20/2021   IFG (impaired fasting glucose) 03/20/2021   Aortic atherosclerosis ([place]) 07/29/2020   Chronic respiratory failure with hypoxia ([place]) 07/29/2020   severe restrictive pulmonary physiology due to severe kyphoscoliosis 07/04/2020   Porcelain gallbladder 11/30/2019   Pulmonary arterial hypertension ([place]) 10/02/2019   Intestinal metaplasia of gastric mucosa    Chronic atrophic gastritis without bleeding 12/15/2018   B12 deficiency 08/04/2018   Nodule of upper lobe of left lung 05/26/2018   Anemia in chronic kidney disease 05/26/2018   Coronary artery calcification 06/18/2017   Heart murmur 04/13/2017   Chronic kidney disease, stage 3 ([place]) 04/13/2017   Advanced care planning/counseling discussion 01/05/2017   Hx of colonic polyps    Benign neoplasm of cecum    Osteopenia 07/26/2015   COPD, severe ([place]) 07/26/2015   Essential hypertension 07/26/2015   Hyperlipidemia  07/26/2015    Conditions to be addressed/monitored:HTN, HLD, COPD, and CKD Stage 3  Care Plan : RNCM: HLD Management  Updates made by Vanita Ingles, RN since 09/03/2021 12:00 AM  Completed 09/03/2021   Problem: RNCM: HLD Management Resolved 09/03/2021  Priority: Medium     Long-Range Goal: RNCM:  HLD Completed 09/03/2021  Start Date: 11/06/2020  Expected End Date: 04/08/2022  Recent Progress: On track  Priority: Medium  Note:   Current Barriers: Resolving, duplicate goal Poorly controlled hyperlipidemia, complicated by HTN, COPD  Current antihyperlipidemic regimen: Lipitor 20 mg Most recent lipid panel:  Lab Results  Component Value Date   CHOL 192 04/20/2020   CHOL 181 11/24/2017   CHOL 198 04/13/2017   Lab Results  Component Value Date   HDL 52 04/20/2020   HDL 51 11/24/2017   HDL 62 04/13/2017   Lab Results  Component Value Date   LDLCALC 105 (H) 04/20/2020   LDLCALC 102 (H) 11/24/2017   LDLCALC 108 (H) 04/13/2017   Lab Results  Component Value Date   TRIG 205 (H) 04/20/2020   TRIG 142 11/24/2017   TRIG 139 04/13/2017   No results found for: CHOLHDL No results found for: LDLDIRECT  ASCVD risk enhancing conditions: age >31,  HTN, CKD, former smoker Unable to independently manage HLD Lacks social connections Does not contact provider office for questions/concerns  RN Care Manager Clinical Goal(s):  patient will work with Consulting civil engineer, providers, and care team towards execution of optimized self-health management plan  patient will verbalize understanding of plan for effective management of HLD patient will attend all scheduled medical appointments: 09/19/2021  Interventions: Collaboration with Venita Lick, NP regarding development and update of comprehensive plan of care as evidenced by provider attestation and co-signature Inter-disciplinary care team collaboration (see longitudinal plan of care) Medication review performed; medication list updated in electronic medical record.  Inter-disciplinary care team collaboration (see longitudinal plan of care) Referred to pharmacy team for assistance with HLD medication management Evaluation of current treatment plan related to HLD and patient's adherence to plan as established by provider.  07-02-2021:   Patient is compliant with medications.  Patient denies any issues or concerns. Will have an ultrasound of her abdomen on 07-05-2021 for yearly evaluation of porcelain gallbladder.  The patient denies any issues related to gallbladder or pain in abdomen.  Advised patient to call the office for changes in condition or questions Reviewed scheduled/upcoming provider appointments including: 09-19-2021 at 10:00 am   Patient Goals/Self-Care Activities: Over the next 120 days, patient will:   - call for medicine refill 2 or 3 days before it runs out - call if I am sick and can't take my medicine - keep a list of all the medicines I take; vitamins and herbals too - learn to read medicine labels - use a pillbox to sort medicine - use an alarm clock or phone to remind me to take my medicine - change to whole grain breads, cereal, pasta - drink 6 to 8 glasses of water each day - eat 5 or 6 small meals each day - limit fast food meals to no more than 1 per week - prepare main meal at home 3 to 5 days each week - read food labels for fat, fiber, carbohydrates and portion size - be open to making changes - I can manage, know and watch for signs of a heart attack - if I have chest pain, call for help - learn about small  changes that will make a big difference - learn my personal risk factors - education plan reviewed and/or amended - empathy and reassurance conveyed - family/caregiver participation in learning encouraged - health literacy screen reviewed - patient's preferred learning methods utilized - questions encouraged - readiness to learn monitored  Follow Up Plan: Telephone follow up appointment with care management team member scheduled for: 09-03-2021 at 1:45pm      Care Plan : RNCM: Hypertension (Adult)  Updates made by Vanita Ingles, RN since 09/03/2021 12:00 AM  Completed 09/03/2021   Problem: RNCM: Hypertension (Hypertension) Resolved 09/03/2021  Priority: Medium      Long-Range Goal: RNCM: Hypertension Monitored Completed 09/03/2021  Start Date: 11/06/2020  Expected End Date: 04/08/2022  Recent Progress: On track  Priority: Medium  Note:    Objective: Resolving, duplicate goal  Last practice recorded BP readings:  BP Readings from Last 3 Encounters:  07/02/21 136/73  06/19/21 (!) 143/70  06/18/21 130/64    Most recent eGFR/CrCl: No results found for: EGFR  No components found for: CRCL Current Barriers:  Knowledge Deficits related to basic understanding of hypertension pathophysiology and self care management Knowledge Deficits related to understanding of medications prescribed for management of hypertension Limited Social Support Unable to independently manage HTN Unable to self administer medications as prescribed Does not contact provider office for questions/concerns Case Manager Clinical Goal(s):  patient will verbalize understanding of plan for hypertension management  patient will attend all scheduled medical appointments: 09-19-2021 patient will demonstrate improved adherence to prescribed treatment plan for hypertension as evidenced by taking all medications as prescribed, monitoring and recording blood pressure as directed, adhering to low sodium/DASH diet  patient will demonstrate improved health management independence as evidenced by checking blood pressure as directed and notifying PCP if SBP>160 or DBP > 90, taking all medications as prescribe, and adhering to a low sodium diet as discussed. Interventions:  Collaboration with Venita Lick, NP regarding development and update of comprehensive plan of care as evidenced by provider attestation and co-signature Inter-disciplinary care team collaboration (see longitudinal plan of care) Evaluation of current treatment plan related to hypertension self management and patient's adherence to plan as established by provider. 04/02/2021 Patient monitoring blood pressure once a week at home.   Yesterday's blood pressure was 133/73.  Patient denies any issues or concerns. 07-02-2021: The patient continues to check blood pressures at home. States it is good at home. Does have "white coat syndrome" and her pressure goes up sometimes when she has appointments. States she feels better since iron infusions. Will have a new ECHO on 07-18-2021 for evaluation of EF% and heart function. Denies any new issues. Husband states she is eating well and no new concerns related to cardiac health and well being.  Provided education to patient re: stroke prevention, s/s of heart attack and stroke, DASH diet, complications of uncontrolled blood pressure Reviewed medications with patient and discussed importance of compliance,. 07-02-2021: Is compliant with medications. Denies any issues with cost or obtaining medications.  Discussed plans with patient for ongoing care management follow up and provided patient with direct contact information for care management team Advised patient, providing education and rationale, to monitor blood pressure daily and record, calling PCP for findings outside established parameters.  Reviewed scheduled/upcoming provider appointments including: 09-19-2021 at 10:00am Patient Goals/Self-Care Activities Over the next 120 days, patient will:  - UNABLE to independently manage HTN Self administers medications as prescribed Attends all scheduled provider appointments Calls provider office for new  concerns, questions, or BP outside discussed parameters Checks BP and records as discussed Follows a low sodium diet/DASH diet - blood pressure trends reviewed - depression screen reviewed - home or ambulatory blood pressure monitoring encouraged Follow Up Plan: Telephone follow up appointment with care management team member scheduled for: 09-03-2021 at 1:45 pm    Care Plan : RNCM: COPD (Adult)  Updates made by Vanita Ingles, RN since 09/03/2021 12:00 AM  Completed 09/03/2021   Problem:  RNCM: Psychological Adjustment to Diagnosis (COPD) Resolved 09/03/2021  Priority: Medium     Long-Range Goal: RNCM: COPD Adjustment to Disease Achieved Completed 09/03/2021  Start Date: 11/06/2020  Expected End Date: 04/08/2022  Recent Progress: On track  Priority: Medium  Note:   Current Barriers: Resolving, duplicate goal  Knowledge deficits related to basic understanding of COPD disease process Knowledge deficits related to basic COPD self care/management Knowledge deficit related to basic understanding of how to use inhalers and how inhaled medications work Knowledge deficit related to importance of energy conservation Limited Social Support Unable to independently manage COPD Lacks social connections Does not contact provider office for questions/concerns  Case Manager Clinical Goal(s): patient will report using inhalers as prescribed including rinsing mouth after use Over the next 120 days patient will report utilizing pursed lip breathing for shortness of breath  patient will be able to verbalize understanding of COPD action plan and when to seek appropriate levels of medical care patient will engage in lite exercise as tolerated to build/regain stamina and strength and reduce shortness of breath through activity tolerance  patient will verbalize basic understanding of COPD disease process and self care activities patient will not be hospitalized for COPD exacerbation  Interventions:  Collaboration with Venita Lick, NP regarding development and update of comprehensive plan of care as evidenced by provider attestation and co-signature Inter-disciplinary care team collaboration (see longitudinal plan of care) Provided patient with basic written and verbal COPD education on self care/management/and exacerbation prevention. 04/02/2021:  Patient started on O2 supplementation at 2 Liters/min via nasal cannula on 07/04/20. Patient reports using O2 "most of the time" and helps her  breathing a great deal. 07-02-2021: Continues to use oxygen. Denies any issues with breathing at this time. Provided patient with COPD action plan and reinforced importance of daily self assessment. 07-02-2021: The patients husband is very active in the patients care. He helps her manage her conditions. States that her breathing has been stable for the most part. Notes sometimes it may be a little worse. Feels like the iron infusions have helped as well.  Discussed Pulmonary Rehab and offered to assist with referral placement Provided written and verbal instructions on pursed lip breathing and utilized returned demonstration as teach back Provided instruction about proper use of medications used for management of COPD including inhalers Advised patient to self assesses COPD action plan zone and make appointment with provider if in the yellow zone for 48 hours without improvement. Provided patient with education about the role of exercise in the management of COPD Advised patient to engage in light exercise as tolerated 3-5 days a week Provided education about and advised patient to utilize infection prevention strategies to reduce risk of respiratory infection. 07-02-2021: Denies any respiratory infections at this time. Knows how to monitor for changes. The patient is stable at this time.  Patient Goals/Self-Care Activities:  - depression screen reviewed - emotional support provided - family involvement promoted - problem-solving facilitated - relaxation techniques promoted - verbalization of feelings encouraged  Follow Up Plan: Telephone follow up appointment with care management team member scheduled for: 09-03-2021 at 1:45 pm    Care Plan : RNCM: CKD and anemia  Updates made by Vanita Ingles, RN since 09/03/2021 12:00 AM  Completed 09/03/2021   Problem: RNCM: CKD and anemia Resolved 09/03/2021  Priority: High  Onset Date: 07/02/2021     Long-Range Goal: RNCM: CKD and Anemia Completed  09/03/2021  Start Date: 07/02/2021  Expected End Date: 07/02/2022  Recent Progress: On track  Priority: High  Note:   Current Barriers: Resolving, duplicate goal  Ineffective Self Health Maintenance in a patient with CKD Stage 3 and anemia Unable to independently manage CKD3 as evidence of anemia and Vitamin B12 deficiency and receiving infusions x 5 of Venofer Clinical Goal(s):  Collaboration with Venita Lick, NP regarding development and update of comprehensive plan of care as evidenced by provider attestation and co-signature Inter-disciplinary care team collaboration (see longitudinal plan of care) patient will work with care management team to address care coordination and chronic disease management needs related to Disease Management Educational Needs Care Coordination   Interventions:  Evaluation of current treatment plan related to CKD Stage 3 and anemia, self-management and patient's adherence to plan as established by provider. 07-02-2021: The patient has received 5 infusions of Venofer due to CKD3 with anemia and Vitamin B12 deficiency. The patients ferritin was at 29 and Iron saturation was at 11%. The patient and the patients husband feel like the infusions have helped the patient feel better and have seen an overall improvement in her conditions. She will have further testing and recommendations in the coming months.  Collaboration with Venita Lick, NP regarding development and update of comprehensive plan of care as evidenced by provider attestation       and co-signature Inter-disciplinary care team collaboration (see longitudinal plan of care) Discussed plans with patient for ongoing care management follow up and provided patient with direct contact information for care management team Self Care Activities:  Patient verbalizes understanding of plan to effectively manage CKD3 with anemia and Vitamin B12 deficiency  Self administers medications as prescribed Attends  all scheduled provider appointments Calls pharmacy for medication refills Attends church or other social activities Performs ADL's independently Performs IADL's independently Calls provider office for new concerns or questions Patient Goals: - activity or exercise based on tolerance encouraged - barriers to lifestyle change identified - barriers to treatment adherence identified - counseling by pharmacist provided - difficulty of making life-long changes acknowledged - healthy lifestyle promoted - individualized medical nutrition therapy provided - medication side effects managed - response to pharmacologic therapy monitored - self-awareness of signs/symptoms of worsening disease encouraged Follow Up Plan: Telephone follow up appointment with care management team member scheduled for: 09-03-2021 at 145 pm    Care Plan : [facility]; General Plan of Care (Adult) for Chronic Disease Management and Care Coordination Needs  Updates made by Vanita Ingles, RN since 09/03/2021 12:00 AM     Problem: RNCM: Development of Plan of care for Chronic Disease Management (HTN, HLD, COPD, CKD3 with anemia)   Priority: High     Long-Range Goal: RNCM: Effective Managment  of Plan of care for Chronic Disease Management (HTN, HLD, COPD, CKD3 with anemia)   Start Date: 09/03/2021  Expected End Date: 09/03/2022  Priority: High  Note:   Current Barriers:  Knowledge Deficits related to plan of care for management of HTN, HLD, COPD, and CKD Stage 3  Chronic Disease Management  support and education needs related to HTN, HLD, COPD, and CKD Stage 3  RNCM Clinical Goal(s):  Patient will verbalize understanding of plan for management of HTN, HLD, COPD, and CKD Stage 3 as evidenced by following the plan of care, calling the office for changes and working with the CCM team to optimize health and well being  take all medications exactly as prescribed and will call provider for medication related questions as  evidenced by taking as prescribed and calling for refills before running out of medications     attend all scheduled medical appointments: 09-19-2021 at 10 am with the pcp  as evidenced by keeping appointments and calling the office for needed schedule changes         demonstrate a decrease in HTN, HLD, COPD, and CKD Stage 3 exacerbations  as evidenced by working with the CCM team and pcp to optimize health and well being demonstrate ongoing self health care management ability for effective management of chronic conditions  as evidenced by  working with the CCM team through collaboration with Consulting civil engineer, provider, and care team.   Interventions: 1:1 collaboration with primary care provider regarding development and update of comprehensive plan of care as evidenced by provider attestation and co-signature Inter-disciplinary care team collaboration (see longitudinal plan of care) Evaluation of current treatment plan related to  self management and patient's adherence to plan as established by provider   Chronic Kidney Disease (Status: Goal on Track (progressing): YES.)  Long Term Goal  Last practice recorded BP readings:  BP Readings from Last 3 Encounters:  07/23/21 (!) 157/80  07/15/21 (!) 176/74  07/02/21 136/73  Most recent eGFR/CrCl:  Lab Results  Component Value Date   EGFR 41 (L) 03/20/2021    No components found for: CRCL  Assessed the patients spouse and DRP  understanding of chronic kidney disease    Evaluation of current treatment plan related to chronic kidney disease self management and patient's adherence to plan as established by provider      Provided education to patient re: stroke prevention, s/s of heart attack and stroke    Reviewed prescribed diet heart healthy/Renal  Reviewed medications with patient and discussed importance of compliance    Advised patient, providing education and rationale, to monitor blood pressure daily and record, calling PCP for findings  outside established parameters    Discussed complications of poorly controlled blood pressure such as heart disease, stroke, circulatory complications, vision complications, kidney impairment, sexual dysfunction    Discussed plans with patient for ongoing care management follow up and provided patient with direct contact information for care management team    Screening for signs and symptoms of depression related to chronic disease state      Discussed the impact of chronic kidney disease on daily life and mental health and acknowledged and normalized feelings of disempowerment, fear, and frustration    Assessed social determinant of health barriers    Provided education on kidney disease progression    Engage patient in early, proactive and ongoing discussion about goals of care and what matters most to them    Support coping and stress management by recognizing current strategies and assist in developing new strategies such as mindfulness, journaling, relaxation techniques, problem-solving      COPD: (Status: Goal on Track (progressing): YES.) Long Term Goal  Reviewed medications with patient, including use of prescribed maintenance and rescue inhalers, and provided instruction on medication management and the importance of adherence Provided patient with  basic written and verbal COPD education on self care/management/and exacerbation prevention Advised patient to track and manage COPD triggers Provided written and verbal instructions on pursed lip breathing and utilized returned demonstration as teach back Provided instruction about proper use of medications used for management of COPD including inhalers Advised patient to self assesses COPD action plan zone and make appointment with provider if in the yellow zone for 48 hours without improvement Advised patient to engage in light exercise as tolerated 3-5 days a week to aid in the the management of COPD Provided education about and advised  patient to utilize infection prevention strategies to reduce risk of respiratory infection Discussed the importance of adequate rest and management of fatigue with COPD Screening for signs and symptoms of depression related to chronic disease state  Assessed social determinant of health barriers  Hyperlipidemia:  (Status: Goal on Track (progressing): YES.) Long Term Goal  Lab Results  Component Value Date   CHOL 192 04/20/2020   HDL 52 04/20/2020   LDLCALC 105 (H) 04/20/2020   TRIG 205 (H) 04/20/2020     Medication review performed; medication list updated in electronic medical record.  Provider established cholesterol goals reviewed; Counseled on importance of regular laboratory monitoring as prescribed; Provided HLD educational materials; Reviewed role and benefits of statin for ASCVD risk reduction; Discussed strategies to manage statin-induced myalgias; Reviewed importance of limiting foods high in cholesterol;  Hypertension: (Status: Goal on Track (progressing): YES.) Last practice recorded BP readings:  BP Readings from Last 3 Encounters:  07/23/21 (!) 157/80  07/15/21 (!) 176/74  07/02/21 136/73  Most recent eGFR/CrCl:  Lab Results  Component Value Date   EGFR 41 (L) 03/20/2021    No components found for: CRCL  Evaluation of current treatment plan related to hypertension self management and patient's adherence to plan as established by provider;   Provided education to patient re: stroke prevention, s/s of heart attack and stroke; Reviewed prescribed diet heart healthy/Renal  Reviewed medications with patient and discussed importance of compliance;  Discussed plans with patient for ongoing care management follow up and provided patient with direct contact information for care management team; Provided education on prescribed diet heart healthy- renal;  Discussed complications of poorly controlled blood pressure such as heart disease, stroke, circulatory complications,  vision complications, kidney impairment, sexual dysfunction;   Patient Goals/Self-Care Activities: Patient will self administer medications as prescribed as evidenced by self report/primary caregiver report  Patient will attend all scheduled provider appointments as evidenced by clinician review of documented attendance to scheduled appointments and patient/caregiver report Patient will call pharmacy for medication refills as evidenced by patient report and review of pharmacy fill history as appropriate Patient will attend church or other social activities as evidenced by patient report Patient will continue to perform ADL's independently as evidenced by patient/caregiver report Patient will continue to perform IADL's independently as evidenced by patient/caregiver report Patient will call provider office for new concerns or questions as evidenced by review of documented incoming telephone call notes and patient report Patient will work with BSW to address care coordination needs and will continue to work with the clinical team to address health care and disease management related needs as evidenced by documented adherence to scheduled care management/care coordination appointments - avoid second hand smoke - eliminate smoking in my home - identify and remove indoor air pollutants - limit outdoor activity during cold weather - listen for public air quality announcements every day - develop a rescue plan - eliminate  symptom triggers at home - follow rescue plan if symptoms flare-up - keep follow-up appointments: 09-19-2021 with pcp  - use an extra pillow to sleep - develop a new routine to improve sleep - don't eat or exercise right before bedtime - eat healthy/prescribed diet: heart healthy diet  - get at least 7 to 8 hours of sleep at night - use devices that will help like a cane, sock-puller or reacher - practice relaxation or meditation daily - do exercises in a comfortable position  that makes breathing as easy as possible - check blood pressure weekly - choose a place to take my blood pressure (home, clinic or office, retail store) - write blood pressure results in a log or diary - learn about high blood pressure - take blood pressure log to all doctor appointments - call doctor for signs and symptoms of high blood pressure - develop an action plan for high blood pressure - keep all doctor appointments - take medications for blood pressure exactly as prescribed - begin an exercise program - report new symptoms to your doctor - eat more whole grains, fruits and vegetables, lean meats and healthy fats - call for medicine refill 2 or 3 days before it runs out - take all medications exactly as prescribed - call doctor with any symptoms you believe are related to your medicine - call doctor when you experience any new symptoms - go to all doctor appointments as scheduled - adhere to prescribed diet: Heart healthy diet        Plan:Telephone follow up appointment with care management team member scheduled for:  11-05-2021 at 1:45 pm  Noreene Larsson RN, MSN, [facility] Mobile: [phone]

## 2021-09-05 DIAGNOSIS — J449 Chronic obstructive pulmonary disease, unspecified: Secondary | ICD-10-CM | POA: Diagnosis not present

## 2021-09-10 DIAGNOSIS — J449 Chronic obstructive pulmonary disease, unspecified: Secondary | ICD-10-CM | POA: Diagnosis not present

## 2021-09-13 ENCOUNTER — Encounter: Payer: Self-pay | Admitting: Nurse Practitioner

## 2021-09-18 DIAGNOSIS — I10 Essential (primary) hypertension: Secondary | ICD-10-CM

## 2021-09-18 DIAGNOSIS — E782 Mixed hyperlipidemia: Secondary | ICD-10-CM

## 2021-09-18 DIAGNOSIS — N1831 Chronic kidney disease, stage 3a: Secondary | ICD-10-CM

## 2021-09-18 DIAGNOSIS — J449 Chronic obstructive pulmonary disease, unspecified: Secondary | ICD-10-CM

## 2021-09-19 ENCOUNTER — Ambulatory Visit (INDEPENDENT_AMBULATORY_CARE_PROVIDER_SITE_OTHER): Payer: Medicare Other | Admitting: Nurse Practitioner

## 2021-09-19 ENCOUNTER — Other Ambulatory Visit: Payer: Self-pay

## 2021-09-19 ENCOUNTER — Encounter: Payer: Self-pay | Admitting: Nurse Practitioner

## 2021-09-19 VITALS — BP 137/70 | HR 78 | Temp 98.3°F | Wt 164.6 lb

## 2021-09-19 DIAGNOSIS — I272 Pulmonary hypertension, unspecified: Secondary | ICD-10-CM | POA: Diagnosis not present

## 2021-09-19 DIAGNOSIS — K828 Other specified diseases of gallbladder: Secondary | ICD-10-CM | POA: Diagnosis not present

## 2021-09-19 DIAGNOSIS — E538 Deficiency of other specified B group vitamins: Secondary | ICD-10-CM | POA: Diagnosis not present

## 2021-09-19 DIAGNOSIS — R7301 Impaired fasting glucose: Secondary | ICD-10-CM | POA: Diagnosis not present

## 2021-09-19 DIAGNOSIS — M85852 Other specified disorders of bone density and structure, left thigh: Secondary | ICD-10-CM

## 2021-09-19 DIAGNOSIS — D519 Vitamin B12 deficiency anemia, unspecified: Secondary | ICD-10-CM | POA: Diagnosis not present

## 2021-09-19 DIAGNOSIS — Z23 Encounter for immunization: Secondary | ICD-10-CM

## 2021-09-19 DIAGNOSIS — J9611 Chronic respiratory failure with hypoxia: Secondary | ICD-10-CM

## 2021-09-19 DIAGNOSIS — J449 Chronic obstructive pulmonary disease, unspecified: Secondary | ICD-10-CM

## 2021-09-19 DIAGNOSIS — N1831 Chronic kidney disease, stage 3a: Secondary | ICD-10-CM

## 2021-09-19 DIAGNOSIS — G4733 Obstructive sleep apnea (adult) (pediatric): Secondary | ICD-10-CM | POA: Diagnosis not present

## 2021-09-19 DIAGNOSIS — E782 Mixed hyperlipidemia: Secondary | ICD-10-CM | POA: Diagnosis not present

## 2021-09-19 DIAGNOSIS — M8588 Other specified disorders of bone density and structure, other site: Secondary | ICD-10-CM | POA: Diagnosis not present

## 2021-09-19 DIAGNOSIS — I10 Essential (primary) hypertension: Secondary | ICD-10-CM

## 2021-09-19 LAB — MICROALBUMIN, URINE WAIVED
Creatinine, Urine Waived: 200 mg/dL (ref 10–300)
Microalb, Ur Waived: 10 mg/L (ref 0–19)
Microalb/Creat Ratio: <30 mg/g (ref <30)

## 2021-09-19 LAB — BAYER DCA HB A1C WAIVED: HB A1C (BAYER DCA - WAIVED): 4.8 % (ref 4.8–5.6)

## 2021-09-19 NOTE — Assessment & Plan Note (Signed)
Chronic, ongoing.  Continue collaboration with pulmonary.  Continue current inhaler regimen and adjust as needed.  Needs repeat CT lung, recommend they return call to scheduler and schedule scan.

## 2021-09-19 NOTE — Assessment & Plan Note (Addendum)
Chronic, ongoing.  Continue collaboration with pulmonary and current inhaler regimen.  Recommend she scheduled annual lung CT scan for follow-up and provided husband number to call last visit, they have not called, highly recommend they schedule this.  Return in 6 months for follow-up, sooner if worsening symptoms.  CCM referral to get inhaler assist and speak with nurse.

## 2021-09-19 NOTE — Assessment & Plan Note (Signed)
Chronic, ongoing.  Will continue Lisinopril for kidney protection, consider change to Losartan next visit due to underlying COPD.  BMP today.  Is to return to nephrology, recommend they schedule.

## 2021-09-19 NOTE — Assessment & Plan Note (Signed)
Chronic, ongoing with BP at goal today in office and on home readings.  Continue current medication regimen and adjust as needed.  Will continue Lisinopril for kidney protection, consider change to Losartan next visit due to underlying COPD.  Recommend continue to monitor BP at home daily and focus on DASH diet.  Will check BMP today.  Return in 6 months.

## 2021-09-19 NOTE — Assessment & Plan Note (Signed)
Chronic, ongoing.  Continue current medication regimen and adjust as needed. Lipid panel today. 

## 2021-09-19 NOTE — Assessment & Plan Note (Signed)
A1c 4.8% today and urine ALB 10 -- educated patient on this and normal findings with no prediabetes or diabetes.

## 2021-09-19 NOTE — Assessment & Plan Note (Signed)
Noted on scan in 2014 -- check Vit D level today and continue supplement.  Discuss repeat DEXA next visit, this is due.

## 2021-09-19 NOTE — Patient Instructions (Signed)

## 2021-09-19 NOTE — Assessment & Plan Note (Signed)
Chronic, ongoing.  Followed by hematology.  Check B12 level today and continue supplement.

## 2021-09-19 NOTE — Assessment & Plan Note (Signed)
Followed by pulmonary and continues O2 supplementation, which she reports has been beneficial -- will continue this, uses 2 L at baseline.

## 2021-09-19 NOTE — Assessment & Plan Note (Addendum)
Followed by Dr. Pabon, continue this collaboration.  Recent note reviewed. 

## 2021-09-19 NOTE — Assessment & Plan Note (Signed)
Followed by pulmonary, continue O2 at bedtime. 

## 2021-09-19 NOTE — Progress Notes (Signed)
BP 137/70   Pulse 78   Temp 98.3 F (36.8 C) (Oral)   Wt 164 lb 9.6 oz (74.7 kg)   LMP  (LMP Unknown)   SpO2 96%   BMI 31.10 kg/m    Subjective:    Patient ID: Madison Franklin, female    DOB: [birth year], 75 y.o.   MRN: [id number]  HPI: Madison Franklin is a 75 y.o. female  Chief Complaint  Patient presents with   COPD   Anemia   Chronic Kidney Disease   Hyperlipidemia   Hypertension   Husband present at bedside to assist with HPI.  HYPERTENSION / HYPERLIPIDEMIA Continues on Lisinopril, Amlodipine, and HCTZ + Atorvastatin.  History of elevation in glucose on labs noted.  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 Duration of hypertension: chronic BP monitoring frequency: daily BP range: 130/70-80 range BP medication side effects: no Duration of hyperlipidemia: chronic Cholesterol medication side effects: no Cholesterol supplements: none Medication compliance: good compliance Aspirin: yes Recent stressors: no Recurrent headaches: no Visual changes: no Palpitations: no Dyspnea: improving Chest pain: no Lower extremity edema: no Dizzy/lightheaded: no   OSTEOPENIA Noted on imaging 2014 with BMD measured at [facility] Spine L1-L4 is 0.926 g/cm2 with a T-score of -2.2.  Adequate calcium & vitamin D: yes Weight bearing exercises: yes    COPD Last saw pulmonary on 06/18/21, they started Stiolto -- she was ordered echo but has not attended yet.  Due to her severe pulmonary hypertension she is on O2 supplementation 2L [place] when exerting and at bedtime -- wears mask at bedtime.  Has not gone for yearly lung CT and they have been trying to contact her to follow-up, has lung nodules to monitor -- last 2019.  She reports the oxygen is helping a whole lot.  Quit smoking > 4 years ago.    Currently inhaler regimen: Stiolto and Albuterol. COPD status: stable Satisfied with current treatment?:  yes Oxygen use: yes Dyspnea frequency: at baseline Cough frequency: occasional Rescue inhaler frequency:  once a day Limitation of activity: at times Productive cough: none Last Spirometry: with pulmonary Pneumovax: Up to Date Influenza: Up to Date    CHRONIC KIDNEY DISEASE CKD status: stable Medications renally dose: yes Previous renal evaluation: no Pneumovax:  Up to Date Influenza Vaccine:  Up to Date    ANEMIA Followed by hematology -- continues on oral B12 and iron.   Anemia status: stable Etiology of anemia: Duration of anemia treatment:  Compliance with treatment: good compliance Iron supplementation side effects: no Severity of anemia: mild Fatigue: no Decreased exercise tolerance: at times  Dyspnea on exertion: no Palpitations: no Bleeding: no Pica: no  Relevant past medical, surgical, family and social history reviewed and updated as indicated. Interim medical history since our last visit reviewed. Allergies and medications reviewed and updated.  Review of Systems  Constitutional:  Negative for activity change, appetite change, diaphoresis, fatigue and fever.  Respiratory:  Positive for shortness of breath (improving) and wheezing (improving). Negative for cough and chest tightness.   Cardiovascular:  Negative for chest pain, palpitations and leg swelling.  Gastrointestinal: Negative.   Neurological: Negative.   Psychiatric/Behavioral: Negative.     Per HPI unless specifically indicated above     Objective:    BP 137/70   Pulse 78   Temp 98.3 F (36.8 C) (Oral)   Wt 164 lb 9.6 oz (74.7 kg)  LMP  (LMP Unknown)   SpO2 96%   BMI 31.10 kg/m   Wt Readings from Last 3 Encounters:  09/19/21 164 lb 9.6 oz (74.7 kg)  07/23/21 165 lb (74.8 kg)  07/15/21 162 lb 3.2 oz (73.6 kg)    Physical Exam Vitals and nursing note reviewed.  Constitutional:      General: She is awake. She is not in acute distress.    Appearance: She is well-developed,  well-groomed and overweight. She is not ill-appearing.  HENT:     Head: Normocephalic.     Right Ear: Hearing normal.     Left Ear: Hearing normal.  Eyes:     General: Lids are normal.        Right eye: No discharge.        Left eye: No discharge.     Conjunctiva/sclera: Conjunctivae normal.     Pupils: Pupils are equal, round, and reactive to light.  Neck:     Thyroid: No thyromegaly.     Vascular: No carotid bruit.  Cardiovascular:     Rate and Rhythm: Normal rate and regular rhythm.     Heart sounds: Murmur heard.  Systolic murmur is present with a grade of 3/6.    No gallop.  Pulmonary:     Effort: Pulmonary effort is normal.     Breath sounds: Decreased breath sounds present.     Comments: Clear throughout, but diminished breath sounds throughout.  No wheezing noted on exam today, no rhonchi and no cough present.  No SOB with talking noted. Abdominal:     General: Bowel sounds are normal.     Palpations: Abdomen is soft.  Musculoskeletal:     Cervical back: Normal range of motion and neck supple.     Right lower leg: No edema.     Left lower leg: No edema.  Lymphadenopathy:     Cervical: No cervical adenopathy.  Skin:    General: Skin is warm and dry.  Neurological:     Mental Status: She is alert and oriented to person, place, and time.  Psychiatric:        Attention and Perception: Attention normal.        Mood and Affect: Mood normal.        Speech: Speech normal.        Behavior: Behavior normal. Behavior is cooperative.        Thought Content: Thought content normal.    Results for orders placed or performed in visit on 09/19/21  Microalbumin, Urine Waived  Result Value Ref Range   Microalb, Ur Waived 10 0 - 19 mg/L   Creatinine, Urine Waived 200 10 - 300 mg/dL   Microalb/Creat Ratio <30 <30 mg/g  Bayer DCA Hb A1c Waived  Result Value Ref Range   HB A1C (BAYER DCA - WAIVED) 4.8 4.8 - 5.6 %      Assessment & Plan:   Problem List Items Addressed This  Visit       Cardiovascular and Mediastinum   Essential hypertension    Chronic, ongoing with BP at goal today in office and on home readings.  Continue current medication regimen and adjust as needed.  Will continue Lisinopril for kidney protection, consider change to Losartan next visit due to underlying COPD.  Recommend continue to monitor BP at home daily and focus on DASH diet.  Will check BMP today.  Return in 6 months.      Severe pulmonary hypertension ([place]) - Primary  Chronic, ongoing.  Continue collaboration with pulmonary.  Continue current inhaler regimen and adjust as needed.  Needs repeat CT lung, recommend they return call to scheduler and schedule scan.      Relevant Orders   Basic metabolic panel   AMB Referral to [facility]     Respiratory   Chronic respiratory failure with hypoxia (HCC)    Followed by pulmonary and continues O2 supplementation, which she reports has been beneficial -- will continue this, uses 2 L at baseline.      Relevant Orders   Basic metabolic panel   AMB Referral to [facility]   COPD, severe ([place])    Chronic, ongoing.  Continue collaboration with pulmonary and current inhaler regimen.  Recommend she scheduled annual lung CT scan for follow-up and provided husband number to call last visit, they have not called, highly recommend they schedule this.  Return in 6 months for follow-up, sooner if worsening symptoms.  CCM referral to get inhaler assist and speak with nurse.      Relevant Orders   Basic metabolic panel   AMB Referral to [facility]   OSA (obstructive sleep apnea)    Followed by pulmonary, continue O2 at bedtime.        Digestive   Porcelain gallbladder    Followed by Dr. Dahlia Byes, continue this collaboration.  Recent note reviewed.        Endocrine   IFG (impaired fasting glucose)    A1c 4.8% today and urine ALB 10 -- educated patient on this and normal findings with no  prediabetes or diabetes.      Relevant Orders   Bayer DCA Hb A1c Waived (Completed)     Musculoskeletal and Integument   Osteopenia    Noted on scan in 2014 -- check Vit D level today and continue supplement.  Discuss repeat DEXA next visit, this is due.      Relevant Orders   VITAMIN D 25 Hydroxy (Vit-D Deficiency, Fractures)     Genitourinary   Chronic kidney disease, stage 3 (HCC)    Chronic, ongoing.  Will continue Lisinopril for kidney protection, consider change to Losartan next visit due to underlying COPD.  BMP today.  Is to return to nephrology, recommend they schedule.      Relevant Orders   Basic metabolic panel   Microalbumin, Urine Waived (Completed)   AMB Referral to [place]     Other   B12 deficiency    Chronic, ongoing.  Followed by hematology.  Check B12 level today and continue supplement.      Relevant Orders   Vitamin B12   Hyperlipidemia    Chronic, ongoing.  Continue current medication regimen and adjust as needed.  Lipid panel today.      Relevant Orders   Lipid Panel w/o Chol/HDL Ratio   AMB Referral to [facility]   Other Visit Diagnoses     Flu vaccine need       Flu vaccine today.   Relevant Orders   Flu Vaccine QUAD High Dose(Fluad) (Completed)        Follow up plan: Return in about 6 months (around 03/20/2022) for HTN/HLD, COPD, OSTEOPENIA, CKD, ANEMIA, GALL BLADDER.

## 2021-09-20 ENCOUNTER — Telehealth: Payer: Self-pay

## 2021-09-20 LAB — LIPID PANEL W/O CHOL/HDL RATIO
Cholesterol, Total: 187 mg/dL (ref 100–199)
HDL: 55 mg/dL (ref >39)
LDL Chol Calc (NIH): 107 mg/dL — ABNORMAL HIGH (ref 0–99)
Triglycerides: 142 mg/dL (ref 0–149)
VLDL Cholesterol Cal: 25 mg/dL (ref 5–40)

## 2021-09-20 LAB — BASIC METABOLIC PANEL
BUN/Creatinine Ratio: 19 (ref 12–28)
BUN: 30 mg/dL — ABNORMAL HIGH (ref 8–27)
CO2: 19 mmol/L — ABNORMAL LOW (ref 20–29)
Calcium: 9.8 mg/dL (ref 8.7–10.3)
Chloride: 106 mmol/L (ref 96–106)
Creatinine, Ser: 1.56 mg/dL — ABNORMAL HIGH (ref 0.57–1.00)
Glucose: 111 mg/dL — ABNORMAL HIGH (ref 70–99)
Potassium: 5.3 mmol/L — ABNORMAL HIGH (ref 3.5–5.2)
Sodium: 139 mmol/L (ref 134–144)
eGFR: 34 mL/min/{1.73_m2} — ABNORMAL LOW (ref >59)

## 2021-09-20 LAB — VITAMIN D 25 HYDROXY (VIT D DEFICIENCY, FRACTURES): Vit D, 25-Hydroxy: 45.9 ng/mL (ref 30.0–100.0)

## 2021-09-20 LAB — VITAMIN B12: Vitamin B-12: >2000 pg/mL — ABNORMAL HIGH (ref 232–1245)

## 2021-09-20 NOTE — Chronic Care Management (AMB) (Addendum)
  Chronic Care Management   Note  09/20/2021 Name: Madison Franklin MRN: [id number] DOB: 1946-09-12  Madison Franklin is a 75 y.o. year old female who is a primary care patient of Cannady, Barbaraann Faster, NP. Madison Franklin is currently enrolled in care management services. An additional referral for Pharm D  was placed.   Follow up plan: Telephone appointment with care management team member scheduled for:11/11/2021  Noreene Larsson, [address], [place], [place] [address] Direct Dial: [phone] [email or web address]

## 2021-09-20 NOTE — Progress Notes (Signed)
Contacted via [place] [facility] [place], your labs have returned: - CMP shows ongoing kidney disease -- I do recommend you ensure to schedule follow-up with nephrology and continue to visit with them. Your last visit was October 2021 -- please call them to scheduled follow-up at [facility] nephrology. - Potassium is a little elevated, cut back on foods high in potassium like bananas, mangoes, nuts.  We will recheck next visit. - Vitamin D and B12 stable - Cholesterol levels show LDL, bad cholesterol above goal.  At this time continue Atorvastatin daily, but next visit we may change this. Keep being stellar!!  Thank you for allowing me to participate in your care.  I appreciate you. Kindest regards, Shyne Lehrke

## 2021-09-20 NOTE — Telephone Encounter (Signed)
[employer] form for O2 signed and faxed back nothing further needed.m

## 2021-09-24 DIAGNOSIS — G4733 Obstructive sleep apnea (adult) (pediatric): Secondary | ICD-10-CM | POA: Diagnosis not present

## 2021-09-26 ENCOUNTER — Inpatient Hospital Stay: Payer: Medicare Other

## 2021-09-27 ENCOUNTER — Other Ambulatory Visit: Payer: Medicare Other

## 2021-10-03 ENCOUNTER — Other Ambulatory Visit: Payer: Self-pay

## 2021-10-03 ENCOUNTER — Inpatient Hospital Stay: Payer: Medicare Other | Attending: Oncology

## 2021-10-03 DIAGNOSIS — E538 Deficiency of other specified B group vitamins: Secondary | ICD-10-CM

## 2021-10-03 DIAGNOSIS — D631 Anemia in chronic kidney disease: Secondary | ICD-10-CM | POA: Diagnosis not present

## 2021-10-03 DIAGNOSIS — D509 Iron deficiency anemia, unspecified: Secondary | ICD-10-CM | POA: Diagnosis not present

## 2021-10-03 DIAGNOSIS — N183 Chronic kidney disease, stage 3 unspecified: Secondary | ICD-10-CM | POA: Insufficient documentation

## 2021-10-03 LAB — CBC WITH DIFFERENTIAL/PLATELET
Abs Immature Granulocytes: 0.03 10*3/uL (ref 0.00–0.07)
Basophils Absolute: 0.1 10*3/uL (ref 0.0–0.1)
Basophils Relative: 1 %
Eosinophils Absolute: 0.2 10*3/uL (ref 0.0–0.5)
Eosinophils Relative: 2 %
HCT: 36.6 % (ref 36.0–46.0)
Hemoglobin: 11.2 g/dL — ABNORMAL LOW (ref 12.0–15.0)
Immature Granulocytes: 0 %
Lymphocytes Relative: 16 %
Lymphs Abs: 1.3 10*3/uL (ref 0.7–4.0)
MCH: 26.6 pg (ref 26.0–34.0)
MCHC: 30.6 g/dL (ref 30.0–36.0)
MCV: 86.9 fL (ref 80.0–100.0)
Monocytes Absolute: 0.7 10*3/uL (ref 0.1–1.0)
Monocytes Relative: 9 %
Neutro Abs: 5.8 10*3/uL (ref 1.7–7.7)
Neutrophils Relative %: 72 %
Platelets: 157 10*3/uL (ref 150–400)
RBC: 4.21 MIL/uL (ref 3.87–5.11)
RDW: 14.6 % (ref 11.5–15.5)
WBC: 8 10*3/uL (ref 4.0–10.5)
nRBC: 0 % (ref 0.0–0.2)

## 2021-10-03 LAB — VITAMIN B12: Vitamin B-12: 2152 pg/mL — ABNORMAL HIGH (ref 180–914)

## 2021-10-03 LAB — IRON AND TIBC
Iron: 60 ug/dL (ref 28–170)
Saturation Ratios: 20 % (ref 10.4–31.8)
TIBC: 297 ug/dL (ref 250–450)
UIBC: 237 ug/dL

## 2021-10-03 LAB — FERRITIN: Ferritin: 100 ng/mL (ref 11–307)

## 2021-10-05 DIAGNOSIS — J449 Chronic obstructive pulmonary disease, unspecified: Secondary | ICD-10-CM | POA: Diagnosis not present

## 2021-10-10 DIAGNOSIS — J449 Chronic obstructive pulmonary disease, unspecified: Secondary | ICD-10-CM | POA: Diagnosis not present

## 2021-10-16 IMAGING — US US ABDOMEN LIMITED
1 series · 14 of 25 positions shown · non-contrast
Comparison: [place] abdomen 06/28/2020, CT abdomen pelvis 07/25/2018

CLINICAL DATA: Porcelain gallbladder follow-up.

EXAM:
ULTRASOUND ABDOMEN LIMITED RIGHT UPPER QUADRANT

[Series 1: us abdomen limited · 0.23mm/px · 14 of 45 slices shown]
[im 1/45]
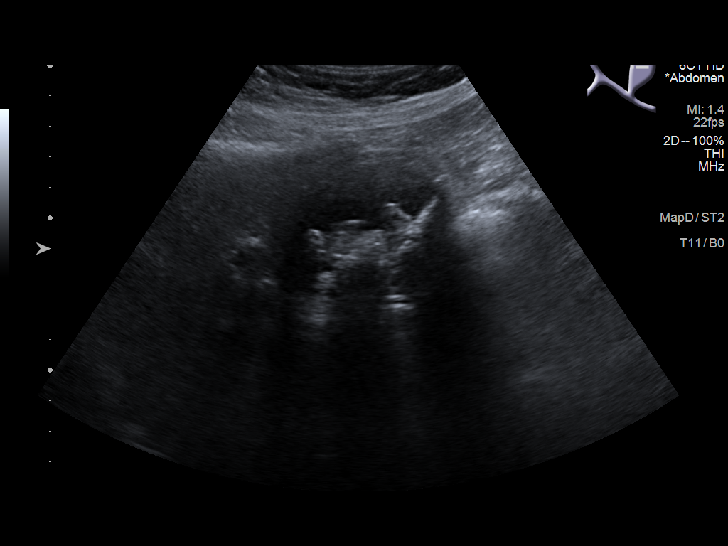
[im 4/45]
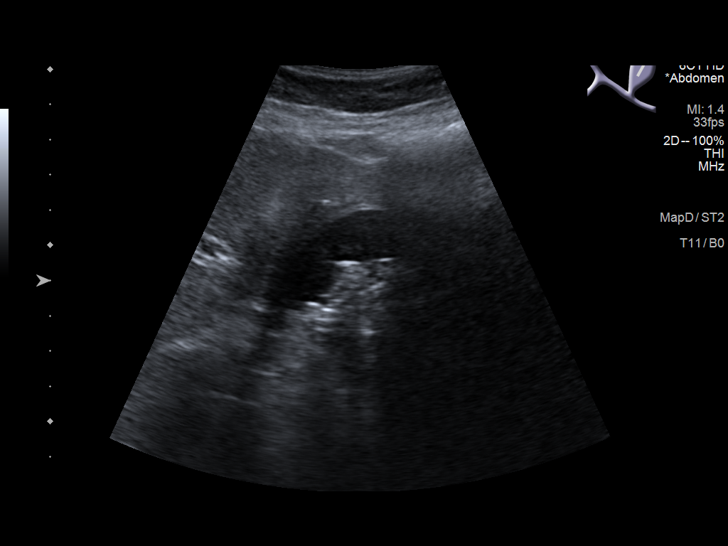
[im 8/45]
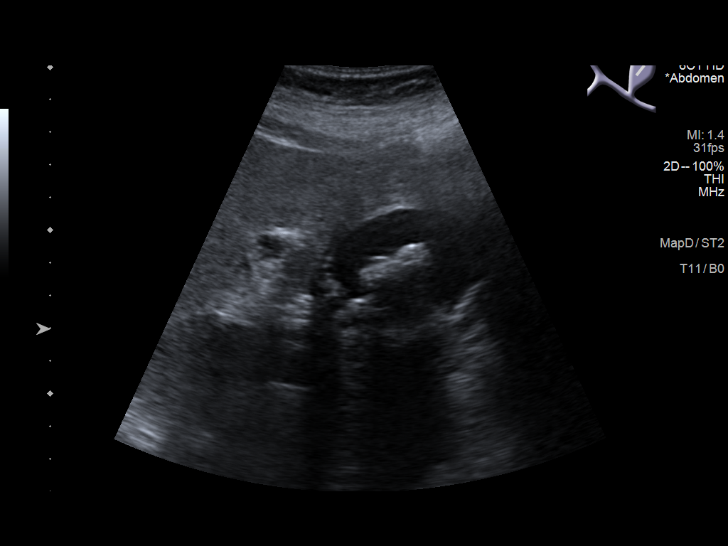
[im 12/45]
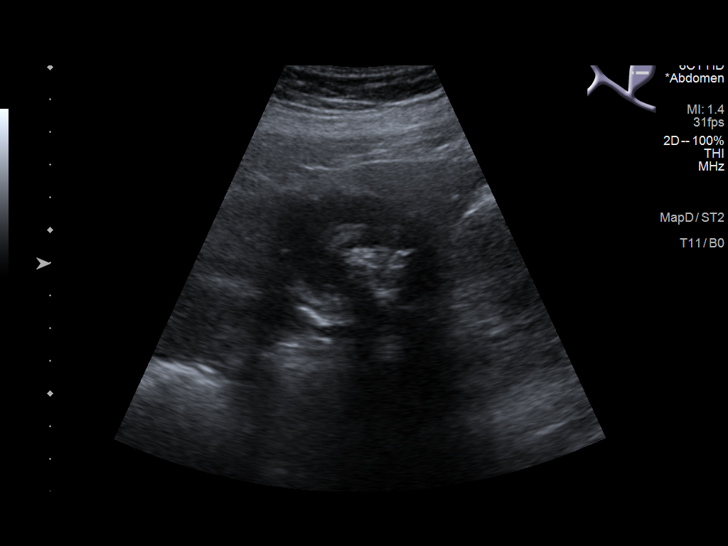
[im 15/45]
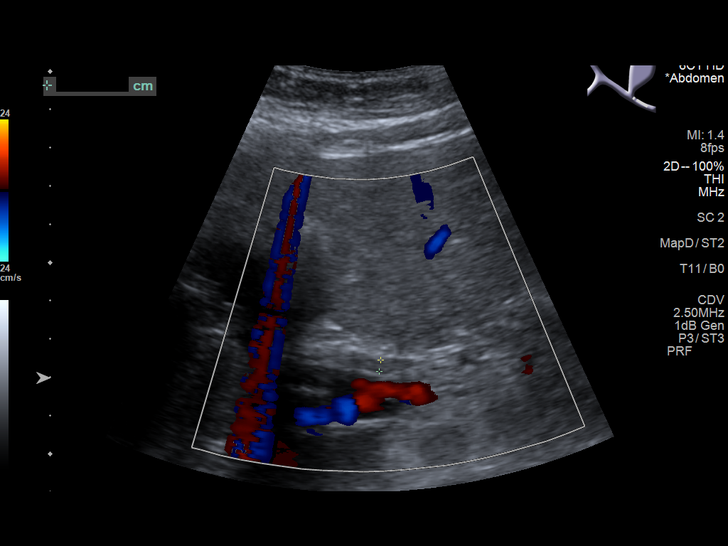
[im 17/45]
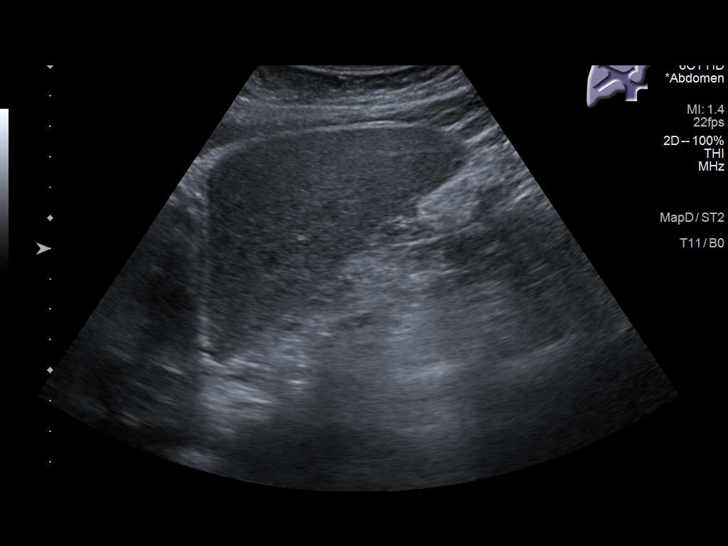
[im 21/45]
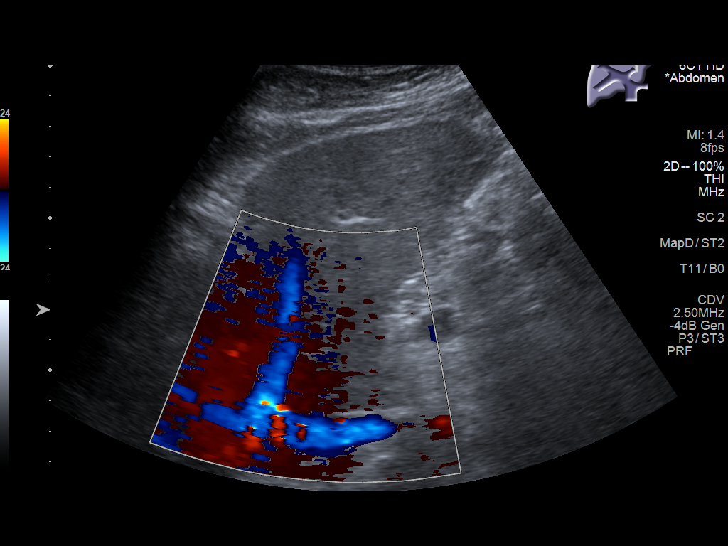
[im 24/45]
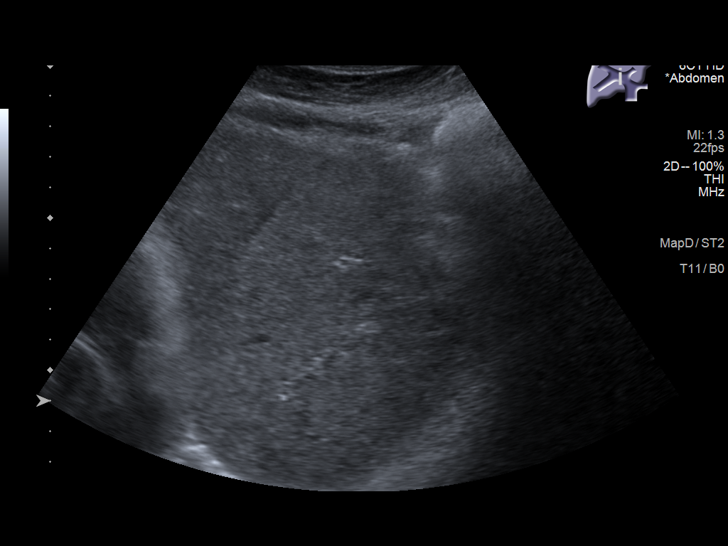
[im 28/45]
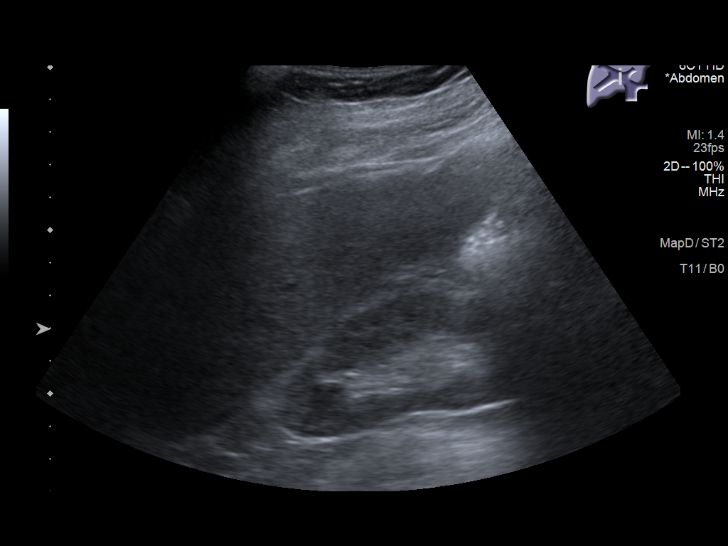
[im 30/45]
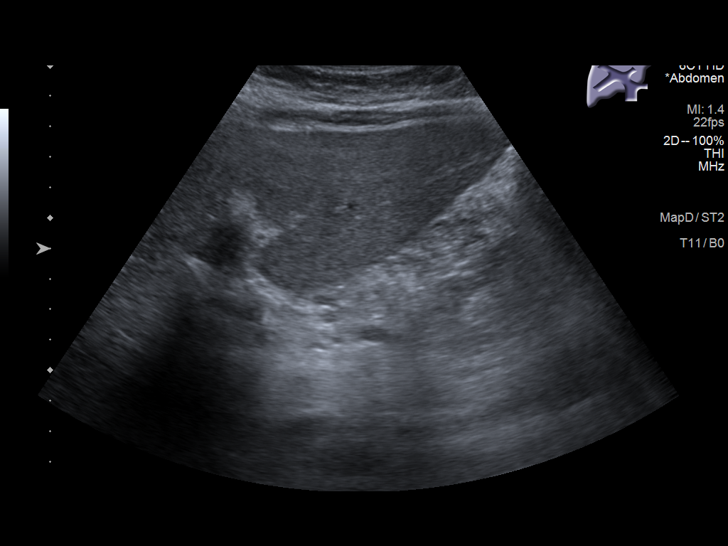
[im 34/45]
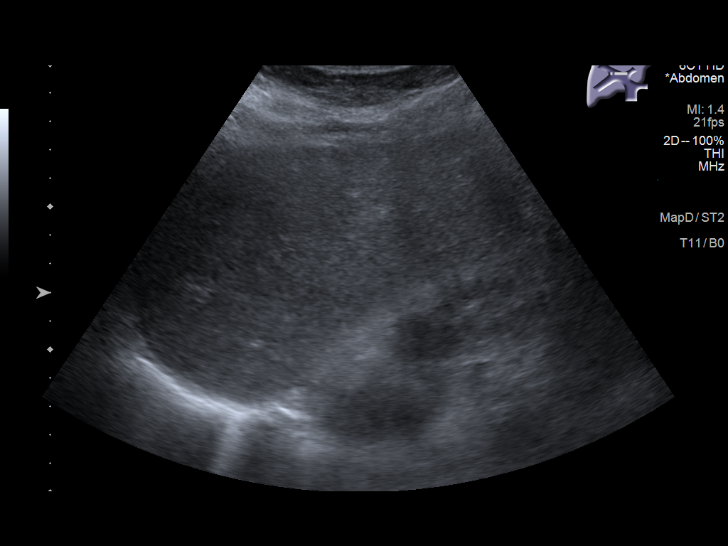
[im 37/45]
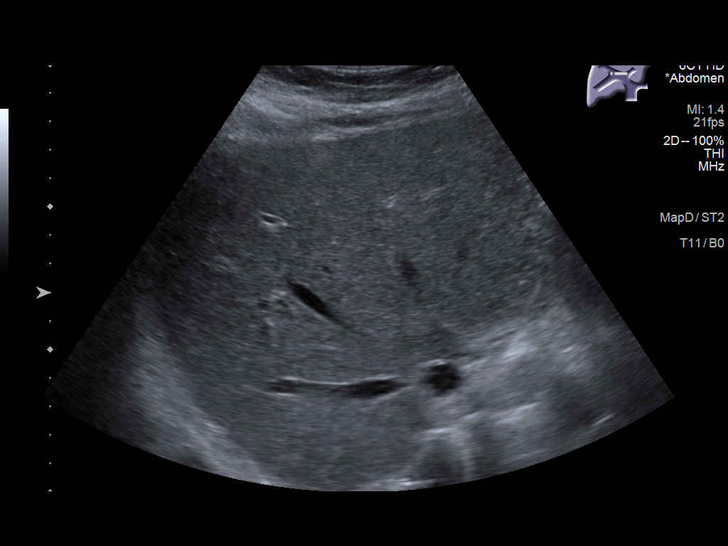
[im 41/45]
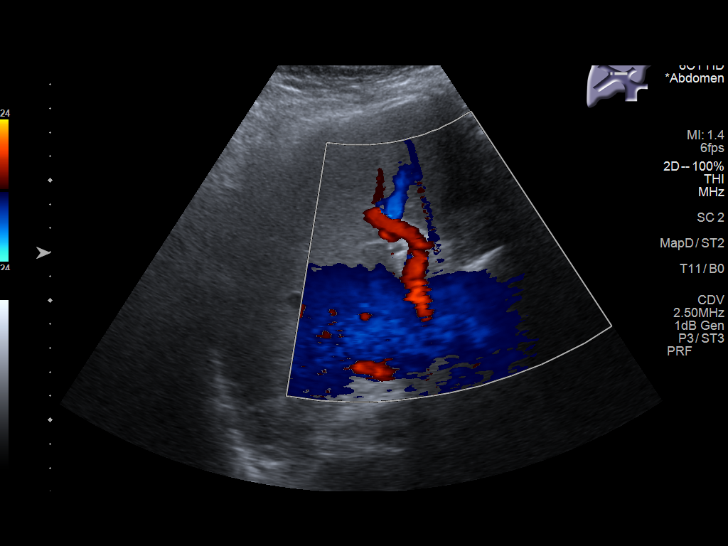
[im 45/45]
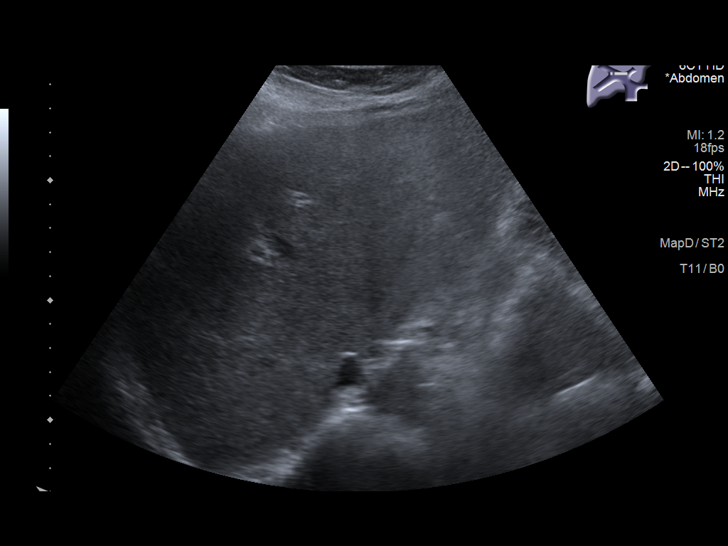

[14 of 25 positions shown; findings below may reference images not displayed]

FINDINGS: Gallbladder:

Stable thickened (5 mm) and calcified gallbladder wall. Calcified
gallstones also noted within the gallbladder lumen. No
pericholecystic free fluid.

Common bile duct:

Diameter: 3 mm.

Liver:

No focal lesion identified. Within normal limits in parenchymal
echogenicity. Portal vein is patent on color Doppler imaging with
normal direction of blood flow towards the liver.

Other: None.
IMPRESSION: 1. Persistent porcelain gallbladder. Recommend continued follow-up
us.
2. Cholelithiasis with no definite findings of acute cholecystitis
or choledocholithiasis.

## 2021-10-28 ENCOUNTER — Ambulatory Visit (INDEPENDENT_AMBULATORY_CARE_PROVIDER_SITE_OTHER): Payer: Medicare Other | Admitting: *Deleted

## 2021-10-28 DIAGNOSIS — Z0000 Encounter for general adult medical examination without abnormal findings: Secondary | ICD-10-CM | POA: Diagnosis not present

## 2021-10-28 NOTE — Progress Notes (Signed)
Subjective:   Madison Franklin is a 76 y.o. female who presents for Medicare Annual (Subsequent) preventive examination.  I connected with  [name] on 10/28/21 by a telephone enabled telemedicine application and verified that I am speaking with the correct person using two identifiers.   I discussed the limitations of evaluation and management by telemedicine. The patient expressed understanding and agreed to proceed.  Patient location: home  Provider location: Tele-Health  not in office    Review of Systems     Cardiac Risk Factors include: advanced age (>7men, >3 women);hypertension;sedentary lifestyle     Objective:    Today's Vitals   There is no height or weight on file to calculate BMI.  Advanced Directives 10/28/2021 07/23/2021 05/29/2021 10/26/2020 10/17/2019 05/25/2019 05/11/2019  Does Patient Have a Medical Advance Directive? No No No No No No No  Does patient want to make changes to medical advance directive? - - No - Patient declined - - - -  Would patient like information on creating a medical advance directive? No - Patient declined No - Patient declined - - - No - Patient declined Yes (MAU/Ambulatory/Procedural Areas - Information given)    Current Medications (verified) Outpatient Encounter Medications as of 10/28/2021  Medication Sig   acetaminophen (TYLENOL) 325 MG tablet Take 325 mg by mouth every 6 (six) hours as needed (for pain.).   albuterol (VENTOLIN HFA) 108 (90 Base) MCG/ACT inhaler Inhale 2 puffs into the lungs every 6 (six) hours as needed for wheezing or shortness of breath.   amLODipine (NORVASC) 5 MG tablet Take 1 tablet (5 mg total) by mouth daily.   aspirin EC 81 MG tablet Take 81 mg by mouth daily.   atorvastatin (LIPITOR) 20 MG tablet Take 1 tablet (20 mg total) by mouth daily.   hydrochlorothiazide (MICROZIDE) 12.5 MG capsule Take 1 capsule (12.5 mg total) by mouth daily.   lisinopril (ZESTRIL) 40 MG tablet Take 1 tablet (40 mg total) by mouth  daily.   Tiotropium Bromide Monohydrate (SPIRIVA RESPIMAT) 2.5 MCG/ACT AERS Inhale 2 puffs into the lungs daily.   Tiotropium Bromide-Olodaterol (STIOLTO RESPIMAT) 2.5-2.5 MCG/ACT AERS Inhale 2.5 mcg into the lungs 2 (two) times daily.   Tiotropium Bromide-Olodaterol 2.5-2.5 MCG/ACT AERS Inhale 2 puffs into the lungs daily.   vitamin B-12 (CYANOCOBALAMIN) 1000 MCG tablet Take 1,000 mcg by mouth daily.   Vitamin D, Cholecalciferol, 1000 units TABS Take 1 tablet by mouth daily.   No facility-administered encounter medications on file as of 10/28/2021.    Allergies (verified) Latex   History: Past Medical History:  Diagnosis Date   COPD (chronic obstructive pulmonary disease) ([place])    Dyspnea    Hearing aid worn    bilateral   Hyperlipidemia    Hypertension    Osteoporosis    Personal history of tobacco use, presenting hazards to health 08/15/2015   Pulmonary hypertension, mild (HCC)    mild to moderate   Past Surgical History:  Procedure Laterality Date   ABDOMINAL HYSTERECTOMY     COLONOSCOPY WITH PROPOFOL N/A 11/14/2016   Procedure: COLONOSCOPY WITH PROPOFOL;  Surgeon: [name], MD;  Location: [place];  Service: Endoscopy;  Laterality: N/A;   COLONOSCOPY WITH PROPOFOL N/A 11/10/2018   Procedure: COLONOSCOPY WITH PROPOFOL;  Surgeon: Lin Landsman, MD;  Location: [place];  Service: Endoscopy;  Laterality: N/A;   ESOPHAGOGASTRODUODENOSCOPY (EGD) WITH PROPOFOL  11/10/2018   Procedure: ESOPHAGOGASTRODUODENOSCOPY (EGD) WITH PROPOFOL;  Surgeon: Lin Landsman, MD;  Location: [place];  Service: Endoscopy;;   ESOPHAGOGASTRODUODENOSCOPY (EGD) WITH PROPOFOL N/A 05/11/2019   Procedure: ESOPHAGOGASTRODUODENOSCOPY (EGD) WITH PROPOFOL;  Surgeon: Lin Landsman, MD;  Location: [place];  Service: Endoscopy;  Laterality: N/A;   long nodule     POLYPECTOMY  11/14/2016   Procedure: POLYPECTOMY;  Surgeon: [name], MD;  Location:  [place];  Service: Endoscopy;;   POLYPECTOMY  11/10/2018   Procedure: POLYPECTOMY;  Surgeon: Lin Landsman, MD;  Location: [place];  Service: Endoscopy;;   RIGHT HEART CATH Right 07/14/2017   Procedure: RIGHT HEART CATH;  Surgeon: Nelva Bush, MD;  Location: [place] [facility];  Service: Cardiovascular;  Laterality: Right;   Family History  Problem Relation Age of Onset   Heart disease Neg Hx    Social History   Socioeconomic History   Marital status: Married    Spouse name: Not on file   Number of children: Not on file   Years of education: 12   Highest education level: 12th grade  Occupational History   Occupation: retired  Tobacco Use   Smoking status: Former    Packs/day: 0.50    Years: 30.00    Pack years: 15.00    Types: Cigarettes    Quit date: 01/17/2017    Years since quitting: 4.7   Smokeless tobacco: Never  Vaping Use   Vaping Use: Never used  Substance and Sexual Activity   Alcohol use: No    Alcohol/week: 0.0 standard drinks   Drug use: No   Sexual activity: Yes  Other Topics Concern   Not on file  Social History Narrative   Not on file   Social Determinants of Health   Financial Resource Strain: Low Risk    Difficulty of Paying Living Expenses: Not very hard  Food Insecurity: No Food Insecurity   Worried About Charity fundraiser in the Last Year: Never true   Ran Out of Food in the Last Year: Never true  Transportation Needs: No Transportation Needs   Lack of Transportation (Medical): No   Lack of Transportation (Non-Medical): No  Physical Activity: Insufficiently Active   Days of Exercise per Week: 2 days   Minutes of Exercise per Session: 10 min  Stress: No Stress Concern Present   Feeling of Stress : Only a little  Social Connections: Engineer, building services of Communication with Friends and Family: Twice a week   Frequency of Social Gatherings with Friends and Family: Once a week   Attends  Religious Services: More than 4 times per year   Active Member of [employer] or Organizations: Yes   Attends Archivist Meetings: 1 to 4 times per year   Marital Status: Married    Tobacco Counseling Counseling given: Not Answered   Clinical Intake:  Pre-visit preparation completed: Yes  Pain : No/denies pain     Nutritional Risks: None Diabetes: No  How often do you need to have someone help you when you read instructions, pamphlets, or other written materials from your doctor or pharmacy?: 1 - Never  Diabetic?  no  Interpreter Needed?: No  Information entered by :: Leroy Kennedy LPN   Activities of Daily Living In your present state of health, do you have any difficulty performing the following activities: 10/28/2021  Hearing? Y  Vision? N  Difficulty concentrating or making decisions? N  Walking or climbing stairs? Y  Dressing or bathing? N  Doing errands, shopping? Y  Preparing  Food and eating ? N  Using the Toilet? N  In the past six months, have you accidently leaked urine? N  Do you have problems with loss of bowel control? N  Managing your Medications? N  Managing your Finances? N  Housekeeping or managing your Housekeeping? N  Some recent data might be hidden    Patient Care Team: Venita Lick, NP as PCP - General (Nurse Practitioner) [name], MD as Consulting Physician (Gastroenterology) Anthonette Legato, MD (Internal Medicine) Jules Husbands, MD as Consulting Physician (General Surgery) Vanita Ingles, RN as Case Manager ([place]) Madelin Rear, [facility] (Pharmacist)  Indicate any recent Medical Services you may have received from other than [facility] providers in the past year (date may be approximate).     Assessment:   This is a routine wellness examination for [place].  Hearing/Vision screen Hearing Screening - Comments:: Bilateral hearing aids Vision Screening - Comments:: Not up to date No doctor  Dietary issues and exercise  activities discussed: Current Exercise Habits: Home exercise routine, Type of exercise: strength training/weights, Time (Minutes): 15, Frequency (Times/Week): 2, Weekly Exercise (Minutes/Week): 30, Intensity: Mild   Goals Addressed             This Visit's Progress    Patient Stated       No goals       Depression Screen PHQ 2/9 Scores 10/28/2021 07/02/2021 10/26/2020 01/25/2020 10/17/2019 10/14/2018 10/05/2017  PHQ - 2 Score 0 0 0 0 0 0 0    Fall Risk Fall Risk  07/15/2021 10/26/2020 07/09/2020 10/17/2019 07/04/2019  Falls in the past year? 0 0 0 0 0  Number falls in past yr: - - 0 0 0  Injury with Fall? - - 0 0 0  Risk for fall due to : - Medication side effect - - -  Follow up - Falls evaluation completed;Education provided;Falls prevention discussed - - -    FALL RISK PREVENTION PERTAINING TO THE HOME:  Any stairs in or around the home? Yes  If so, are there any without handrails? No  Home free of loose throw rugs in walkways, pet beds, electrical cords, etc? Yes  Adequate lighting in your home to reduce risk of falls? Yes   ASSISTIVE DEVICES UTILIZED TO PREVENT FALLS:  Life alert? No  Use of a cane, walker or w/c? No  Grab bars in the bathroom? No  Shower chair or bench in shower? No  Elevated toilet seat or a handicapped toilet? No   TIMED UP AND GO:  Was the test performed? No .    Cognitive Function:     6CIT Screen 10/28/2021 10/26/2020 10/14/2018 09/30/2017  What Year? 0 points 0 points 0 points 0 points  What month? 0 points 0 points 0 points 0 points  What time? 0 points 0 points 0 points 0 points  Count back from 20 0 points 0 points 0 points 0 points  Months in reverse 2 points 4 points 2 points 0 points  Repeat phrase 0 points 10 points 10 points 4 points  Total Score 2 14 12 4     Immunizations Immunization History  Administered Date(s) Administered   Fluad Quad(high Dose 65+) 08/03/2020, 09/19/2021   Influenza, High Dose Seasonal PF 09/03/2016    Influenza,inj,Quad PF,6+ Mos 07/27/2015   Influenza,inj,quad, With Preservative 08/24/2018, 07/15/2019   Influenza-Unspecified 08/14/2017, 08/24/2018   Moderna Sars-Covid-2 Vaccination 10/18/2020   PFIZER(Purple Top)SARS-COV-2 Vaccination 12/15/2019, 01/11/2020   Pneumococcal Conjugate-13 01/12/2015   Pneumococcal Polysaccharide-23  07/01/2011   Td 07/27/2015    TDAP status: Up to date  Flu Vaccine status: Up to date  Pneumococcal vaccine status: Up to date  Covid-19 vaccine status: Information provided on how to obtain vaccines.   Qualifies for Shingles Vaccine? Yes   Zostavax completed No   Shingrix Completed?: No.    Education has been provided regarding the importance of this vaccine. Patient has been advised to call insurance company to determine out of pocket expense if they have not yet received this vaccine. Advised may also receive vaccine at local pharmacy or Health Dept. Verbalized acceptance and understanding.  Screening Tests Health Maintenance  Topic Date Due   Zoster Vaccines- Shingrix (1 of 2) Never done   COVID-19 Vaccine (4 - Booster for Pfizer series) 12/13/2020   COLONOSCOPY (Pts 45-27yrs Insurance coverage will need to be confirmed)  11/10/2021   TETANUS/TDAP  07/26/2025   Pneumonia Vaccine 73+ Years old  Completed   INFLUENZA VACCINE  Completed   DEXA SCAN  Completed   Hepatitis C Screening  Completed   HPV VACCINES  Aged Out    Health Maintenance  Health Maintenance Due  Topic Date Due   Zoster Vaccines- Shingrix (1 of 2) Never done   COVID-19 Vaccine (4 - Booster for Pfizer series) 12/13/2020    Colorectal cancer screening: No longer required.     Bone Density status: Completed 2016. Results reflect: Bone density results: OSTEOPENIA. Repeat every   years.  Lung Cancer Screening: (Low Dose CT Chest recommended if Age 61-80 years, 30 pack-year currently smoking OR have quit w/in 15years.) does not qualify.   Lung Cancer Screening Referral:    Additional Screening:  Hepatitis C Screening: does not qualify; Completed 2016  Vision Screening: Recommended annual ophthalmology exams for early detection of glaucoma and other disorders of the eye. Is the patient up to date with their annual eye exam?  No  Who is the provider or what is the name of the office in which the patient attends annual eye exams?  If pt is not established with a provider, would they like to be referred to a provider to establish care? No .   Dental Screening: Recommended annual dental exams for proper oral hygiene  Community Resource Referral / Chronic Care Management: CRR required this visit?  No   CCM required this visit?  No      Plan:     I have personally reviewed and noted the following in the patients chart:   Medical and social history Use of alcohol, tobacco or illicit drugs  Current medications and supplements including opioid prescriptions.  Functional ability and status Nutritional status Physical activity Advanced directives List of other physicians Hospitalizations, surgeries, and ER visits in previous 12 months Vitals Screenings to include cognitive, depression, and falls Referrals and appointments  In addition, I have reviewed and discussed with patient certain preventive protocols, quality metrics, and best practice recommendations. A written personalized care plan for preventive services as well as general preventive health recommendations were provided to patient.     Leroy Kennedy, LPN   02/22/8126   Nurse Notes:

## 2021-10-28 NOTE — Patient Instructions (Signed)
Madison Franklin , Thank you for taking time to come for your Medicare Wellness Visit. I appreciate your ongoing commitment to your health goals. Please review the following plan we discussed and let me know if I can assist you in the future.   Screening recommendations/referrals: Colonoscopy: no longer indicated  Bone Density: Education provided Recommended yearly ophthalmology/optometry visit for glaucoma screening and checkup Recommended yearly dental visit for hygiene and checkup  Vaccinations: Influenza vaccine: up to date Pneumococcal vaccine: up to date Tdap vaccine: up to date Shingles vaccine: Education provided    Advanced directives: Education provided  Conditions/risks identified:   Next appointment: 03-20-2022 @ 8:00  [place]   Preventive Care 77 Years and Older, Female Preventive care refers to lifestyle choices and visits with your health care provider that can promote health and wellness. What does preventive care include? A yearly physical exam. This is also called an annual well check. Dental exams once or twice a year. Routine eye exams. Ask your health care provider how often you should have your eyes checked. Personal lifestyle choices, including: Daily care of your teeth and gums. Regular physical activity. Eating a healthy diet. Avoiding tobacco and drug use. Limiting alcohol use. Practicing safe sex. Taking low-dose aspirin every day. Taking vitamin and mineral supplements as recommended by your health care provider. What happens during an annual well check? The services and screenings done by your health care provider during your annual well check will depend on your age, overall health, lifestyle risk factors, and family history of disease. Counseling  Your health care provider may ask you questions about your: Alcohol use. Tobacco use. Drug use. Emotional well-being. Home and relationship well-being. Sexual activity. Eating habits. History of  falls. Memory and ability to understand (cognition). Work and work Statistician. Reproductive health. Screening  You may have the following tests or measurements: Height, weight, and BMI. Blood pressure. Lipid and cholesterol levels. These may be checked every 5 years, or more frequently if you are over 74 years old. Skin check. Lung cancer screening. You may have this screening every year starting at age 15 if you have a 30-pack-year history of smoking and currently smoke or have quit within the past 15 years. Fecal occult blood test (FOBT) of the stool. You may have this test every year starting at age 24. Flexible sigmoidoscopy or colonoscopy. You may have a sigmoidoscopy every 5 years or a colonoscopy every 10 years starting at age 25. Hepatitis C blood test. Hepatitis B blood test. Sexually transmitted disease (STD) testing. Diabetes screening. This is done by checking your blood sugar (glucose) after you have not eaten for a while (fasting). You may have this done every 1-3 years. Bone density scan. This is done to screen for osteoporosis. You may have this done starting at age 60. Mammogram. This may be done every 1-2 years. Talk to your health care provider about how often you should have regular mammograms. Talk with your health care provider about your test results, treatment options, and if necessary, the need for more tests. Vaccines  Your health care provider may recommend certain vaccines, such as: Influenza vaccine. This is recommended every year. Tetanus, diphtheria, and acellular pertussis (Tdap, Td) vaccine. You may need a Td booster every 10 years. Zoster vaccine. You may need this after age 16. Pneumococcal 13-valent conjugate (PCV13) vaccine. One dose is recommended after age 1. Pneumococcal polysaccharide (PPSV23) vaccine. One dose is recommended after age 35. Talk to your health care provider about which screenings  and vaccines you need and how often you need  them. This information is not intended to replace advice given to you by your health care provider. Make sure you discuss any questions you have with your health care provider. Document Released: 11/02/2015 Document Revised: 06/25/2016 Document Reviewed: 08/07/2015 Elsevier Interactive Patient Education  2017 [employer] Prevention in the Home Falls can cause injuries. They can happen to people of all ages. There are many things you can do to make your home safe and to help prevent falls. What can I do on the outside of my home? Regularly fix the edges of walkways and driveways and fix any cracks. Remove anything that might make you trip as you walk through a door, such as a raised step or threshold. Trim any bushes or trees on the path to your home. Use bright outdoor lighting. Clear any walking paths of anything that might make someone trip, such as rocks or tools. Regularly check to see if handrails are loose or broken. Make sure that both sides of any steps have handrails. Any raised decks and porches should have guardrails on the edges. Have any leaves, snow, or ice cleared regularly. Use sand or salt on walking paths during winter. Clean up any spills in your garage right away. This includes oil or grease spills. What can I do in the bathroom? Use night lights. Install grab bars by the toilet and in the tub and shower. Do not use towel bars as grab bars. Use non-skid mats or decals in the tub or shower. If you need to sit down in the shower, use a plastic, non-slip stool. Keep the floor dry. Clean up any water that spills on the floor as soon as it happens. Remove soap buildup in the tub or shower regularly. Attach bath mats securely with double-sided non-slip rug tape. Do not have throw rugs and other things on the floor that can make you trip. What can I do in the bedroom? Use night lights. Make sure that you have a light by your bed that is easy to reach. Do not use  any sheets or blankets that are too big for your bed. They should not hang down onto the floor. Have a firm chair that has side arms. You can use this for support while you get dressed. Do not have throw rugs and other things on the floor that can make you trip. What can I do in the kitchen? Clean up any spills right away. Avoid walking on wet floors. Keep items that you use a lot in easy-to-reach places. If you need to reach something above you, use a strong step stool that has a grab bar. Keep electrical cords out of the way. Do not use floor polish or wax that makes floors slippery. If you must use wax, use non-skid floor wax. Do not have throw rugs and other things on the floor that can make you trip. What can I do with my stairs? Do not leave any items on the stairs. Make sure that there are handrails on both sides of the stairs and use them. Fix handrails that are broken or loose. Make sure that handrails are as long as the stairways. Check any carpeting to make sure that it is firmly attached to the stairs. Fix any carpet that is loose or worn. Avoid having throw rugs at the top or bottom of the stairs. If you do have throw rugs, attach them to the floor with carpet tape.  Make sure that you have a light switch at the top of the stairs and the bottom of the stairs. If you do not have them, ask someone to add them for you. What else can I do to help prevent falls? Wear shoes that: Do not have high heels. Have rubber bottoms. Are comfortable and fit you well. Are closed at the toe. Do not wear sandals. If you use a stepladder: Make sure that it is fully opened. Do not climb a closed stepladder. Make sure that both sides of the stepladder are locked into place. Ask someone to hold it for you, if possible. Clearly mark and make sure that you can see: Any grab bars or handrails. First and last steps. Where the edge of each step is. Use tools that help you move around (mobility aids)  if they are needed. These include: Canes. Walkers. Scooters. Crutches. Turn on the lights when you go into a dark area. Replace any light bulbs as soon as they burn out. Set up your furniture so you have a clear path. Avoid moving your furniture around. If any of your floors are uneven, fix them. If there are any pets around you, be aware of where they are. Review your medicines with your doctor. Some medicines can make you feel dizzy. This can increase your chance of falling. Ask your doctor what other things that you can do to help prevent falls. This information is not intended to replace advice given to you by your health care provider. Make sure you discuss any questions you have with your health care provider. Document Released: 08/02/2009 Document Revised: 03/13/2016 Document Reviewed: 11/10/2014 Elsevier Interactive Patient Education  2017 [employer].

## 2021-11-05 ENCOUNTER — Telehealth: Payer: Medicare Other

## 2021-11-05 ENCOUNTER — Ambulatory Visit (INDEPENDENT_AMBULATORY_CARE_PROVIDER_SITE_OTHER): Payer: Medicare Other

## 2021-11-05 DIAGNOSIS — I10 Essential (primary) hypertension: Secondary | ICD-10-CM

## 2021-11-05 DIAGNOSIS — N1831 Chronic kidney disease, stage 3a: Secondary | ICD-10-CM

## 2021-11-05 DIAGNOSIS — J449 Chronic obstructive pulmonary disease, unspecified: Secondary | ICD-10-CM

## 2021-11-05 DIAGNOSIS — N1832 Chronic kidney disease, stage 3b: Secondary | ICD-10-CM

## 2021-11-05 DIAGNOSIS — E782 Mixed hyperlipidemia: Secondary | ICD-10-CM

## 2021-11-05 NOTE — Chronic Care Management (AMB) (Signed)
Chronic Care Management   CCM RN Visit Note  11/05/2021 Name: Madison Franklin MRN: [id number] DOB: 1946/08/27  Subjective: Madison Franklin is a 76 y.o. year old female who is a primary care patient of Cannady, Barbaraann Faster, NP. The care management team was consulted for assistance with disease management and care coordination needs.    Engaged with patient by telephone for follow up visit in response to provider referral for case management and/or care coordination services.   Consent to Services:  The patient was given information about Chronic Care Management services, agreed to services, and gave verbal consent prior to initiation of services.  Please see initial visit note for detailed documentation.   Patient agreed to services and verbal consent obtained.   Assessment: Review of patient past medical history, allergies, medications, health status, including review of consultants reports, laboratory and other test data, was performed as part of comprehensive evaluation and provision of chronic care management services.   SDOH (Social Determinants of Health) assessments and interventions performed:    CCM Care Plan  Allergies  Allergen Reactions   Latex Itching and Rash    With latex gloves (when worn)    Outpatient Encounter Medications as of 11/05/2021  Medication Sig   acetaminophen (TYLENOL) 325 MG tablet Take 325 mg by mouth every 6 (six) hours as needed (for pain.).   albuterol (VENTOLIN HFA) 108 (90 Base) MCG/ACT inhaler Inhale 2 puffs into the lungs every 6 (six) hours as needed for wheezing or shortness of breath.   amLODipine (NORVASC) 5 MG tablet Take 1 tablet (5 mg total) by mouth daily.   aspirin EC 81 MG tablet Take 81 mg by mouth daily.   atorvastatin (LIPITOR) 20 MG tablet Take 1 tablet (20 mg total) by mouth daily.   hydrochlorothiazide (MICROZIDE) 12.5 MG capsule Take 1 capsule (12.5 mg total) by mouth daily.   lisinopril (ZESTRIL) 40 MG tablet Take 1 tablet (40 mg total)  by mouth daily.   Tiotropium Bromide Monohydrate (SPIRIVA RESPIMAT) 2.5 MCG/ACT AERS Inhale 2 puffs into the lungs daily.   Tiotropium Bromide-Olodaterol (STIOLTO RESPIMAT) 2.5-2.5 MCG/ACT AERS Inhale 2.5 mcg into the lungs 2 (two) times daily.   Tiotropium Bromide-Olodaterol 2.5-2.5 MCG/ACT AERS Inhale 2 puffs into the lungs daily.   vitamin B-12 (CYANOCOBALAMIN) 1000 MCG tablet Take 1,000 mcg by mouth daily.   Vitamin D, Cholecalciferol, 1000 units TABS Take 1 tablet by mouth daily.   No facility-administered encounter medications on file as of 11/05/2021.    Patient Active Problem List   Diagnosis Date Noted   Mold exposure 03/20/2021   OSA (obstructive sleep apnea) 03/20/2021   IFG (impaired fasting glucose) 03/20/2021   Aortic atherosclerosis ([place]) 07/29/2020   Chronic respiratory failure with hypoxia ([place]) 07/29/2020   severe restrictive pulmonary physiology due to severe kyphoscoliosis 07/04/2020   Porcelain gallbladder 11/30/2019   Severe pulmonary hypertension ([place]) 10/02/2019   Intestinal metaplasia of gastric mucosa    Chronic atrophic gastritis without bleeding 12/15/2018   B12 deficiency 08/04/2018   Nodule of upper lobe of left lung 05/26/2018   Anemia in chronic kidney disease 05/26/2018   Coronary artery calcification 06/18/2017   Heart murmur 04/13/2017   Chronic kidney disease, stage 3 ([place]) 04/13/2017   Advanced care planning/counseling discussion 01/05/2017   Hx of colonic polyps    Benign neoplasm of cecum    Osteopenia 07/26/2015   COPD, severe ([place]) 07/26/2015   Essential hypertension 07/26/2015   Hyperlipidemia 07/26/2015  Conditions to be addressed/monitored:HTN, HLD, COPD, CKD Stage 3, and anemai  Care Plan : RNCM; General Plan of Care (Adult) for Chronic Disease Management and Care Coordination Needs  Updates made by Vanita Ingles, RN since 11/05/2021 12:00 AM     Problem: RNCM: Development of Plan of care for Chronic Disease Management (HTN,  HLD, COPD, CKD3 with anemia)   Priority: High     Long-Range Goal: RNCM: Effective Managment  of Plan of care for Chronic Disease Management (HTN, HLD, COPD, CKD3 with anemia)   Start Date: 09/03/2021  Expected End Date: 09/03/2022  Priority: High  Note:   Current Barriers:  Knowledge Deficits related to plan of care for management of HTN, HLD, COPD, and CKD Stage 3  Chronic Disease Management support and education needs related to HTN, HLD, COPD, and CKD Stage 3  RNCM Clinical Goal(s):  Patient will verbalize understanding of plan for management of HTN, HLD, COPD, and CKD Stage 3 as evidenced by following the plan of care, calling the office for changes and working with the CCM team to optimize health and well being  take all medications exactly as prescribed and will call provider for medication related questions as evidenced by taking as prescribed and calling for refills before running out of medications     attend all scheduled medical appointments: 09-19-2021 at 10 am with the pcp  as evidenced by keeping appointments and calling the office for needed schedule changes         demonstrate a decrease in HTN, HLD, COPD, and CKD Stage 3 exacerbations  as evidenced by working with the CCM team and pcp to optimize health and well being demonstrate ongoing self health care management ability for effective management of chronic conditions  as evidenced by  working with the CCM team through collaboration with Consulting civil engineer, provider, and care team.   Interventions: 1:1 collaboration with primary care provider regarding development and update of comprehensive plan of care as evidenced by provider attestation and co-signature Inter-disciplinary care team collaboration (see longitudinal plan of care) Evaluation of current treatment plan related to  self management and patient's adherence to plan as established by provider   Chronic Kidney Disease (Status: Goal on Track (progressing): YES.)  Long  Term Goal  Last practice recorded BP readings:  BP Readings from Last 3 Encounters:  09/19/21 137/70  07/23/21 (!) 157/80  07/15/21 (!) 176/74  Most recent eGFR/CrCl:  Lab Results  Component Value Date   EGFR 34 (L) 09/19/2021    No components found for: CRCL  Assessed the patients spouse and DRP  understanding of chronic kidney disease. 11-05-2021: The patients spouse has a good understanding of CKD. The patient is compliant with labs and follow up with provider. The patient and spouse deny any new issues or concerns with CKD.    Evaluation of current treatment plan related to chronic kidney disease self management and patient's adherence to plan as established by provider     Provided education to patient re: stroke prevention, s/s of heart attack and stroke    Reviewed prescribed diet heart healthy/Renal  Reviewed medications with patient and discussed importance of compliance. 11-05-2021: States compliance with medications.  The patients husband assist with medications management     Advised patient, providing education and rationale, to monitor blood pressure daily and record, calling PCP for findings outside established parameters    Discussed complications of poorly controlled blood pressure such as heart disease, stroke, circulatory complications, vision complications,  kidney impairment, sexual dysfunction    Discussed plans with patient for ongoing care management follow up and provided patient with direct contact information for care management team    Screening for signs and symptoms of depression related to chronic disease state      Discussed the impact of chronic kidney disease on daily life and mental health and acknowledged and normalized feelings of disempowerment, fear, and frustration    Assessed social determinant of health barriers    Provided education on kidney disease progression    Engage patient in early, proactive and ongoing discussion about goals of care and what  matters most to them    Support coping and stress management by recognizing current strategies and assist in developing new strategies such as mindfulness, journaling, relaxation techniques, problem-solving      COPD: (Status: Goal on Track (progressing): YES.) Long Term Goal  Reviewed medications with patient, including use of prescribed maintenance and rescue inhalers, and provided. 11-05-2021: Review with the patient and patients husband about appointment with pharm D on 11-11-2021 at 2 pm. The inhaler the patient takes is cost prohibitive. Education on being available to discuss options with the pharm D.  instruction on medication management and the importance of adherence Provided patient with basic written and verbal COPD education on self care/management/and exacerbation prevention Advised patient to track and manage COPD triggers. 11-05-2021: The patient and patients husband know the triggers to her COPD. Denies any acute exacerbations or findings at this time.  Provided written and verbal instructions on pursed lip breathing and utilized returned demonstration as teach back Provided instruction about proper use of medications used for management of COPD including inhalers Advised patient to self assesses COPD action plan zone and make appointment with provider if in the yellow zone for 48 hours without improvement Advised patient to engage in light exercise as tolerated 3-5 days a week to aid in the the management of COPD Provided education about and advised patient to utilize infection prevention strategies to reduce risk of respiratory infection. 11-05-2021: Review of safety measures and remaining safe in high risk season of infections with respiratory conditions presently. The patient does not go out a lot and practices safety measures to prevent infection.  Discussed the importance of adequate rest and management of fatigue with COPD. 11-05-2021: The patients husband states that sometimes he  thinks the patient sleeps too much Screening for signs and symptoms of depression related to chronic disease state  Assessed social determinant of health barriers  Hyperlipidemia:  (Status: Goal on Track (progressing): YES.) Long Term Goal  Lab Results  Component Value Date   CHOL 187 09/19/2021   HDL 55 09/19/2021   LDLCALC 107 (H) 09/19/2021   TRIG 142 09/19/2021     Medication review performed; medication list updated in electronic medical record. 11-05-2021: Is compliant with Lipitor 20 mg daily. Denies any issues with compliance.  Provider established cholesterol goals reviewed. 11-05-2021: Review of monitoring for foods high in cholesterol and eating a heart healthy diet; Counseled on importance of regular laboratory monitoring as prescribed. 11-05-2021: The patient has regular lab work for monitoring of cholesterol levels; Provided HLD educational materials; Reviewed role and benefits of statin for ASCVD risk reduction; Discussed strategies to manage statin-induced myalgias; Reviewed importance of limiting foods high in cholesterol;  Hypertension: (Status: Goal on Track (progressing): YES.) Last practice recorded BP readings:  BP Readings from Last 3 Encounters:  09/19/21 137/70  07/23/21 (!) 157/80  07/15/21 (!) 176/74  Most recent  eGFR/CrCl:  Lab Results  Component Value Date   EGFR 34 (L) 09/19/2021    No components found for: CRCL  Evaluation of current treatment plan related to hypertension self management and patient's adherence to plan as established by provider. 11-05-2021: Blood pressure stable at last appointment. The patient and patients husband denies any issues or concerns with blood pressures, HTN, or heart health;   Provided education to patient re: stroke prevention, s/s of heart attack and stroke; Reviewed prescribed diet heart healthy/Renal  Reviewed medications with patient and discussed importance of compliance;  Discussed plans with patient for ongoing  care management follow up and provided patient with direct contact information for care management team; Provided education on prescribed diet heart healthy- renal;  Discussed complications of poorly controlled blood pressure such as heart disease, stroke, circulatory complications, vision complications, kidney impairment, sexual dysfunction;   Patient Goals/Self-Care Activities: Patient will self administer medications as prescribed as evidenced by self report/primary caregiver report  Patient will attend all scheduled provider appointments as evidenced by clinician review of documented attendance to scheduled appointments and patient/caregiver report Patient will call pharmacy for medication refills as evidenced by patient report and review of pharmacy fill history as appropriate Patient will attend church or other social activities as evidenced by patient report Patient will continue to perform ADL's independently as evidenced by patient/caregiver report Patient will continue to perform IADL's independently as evidenced by patient/caregiver report Patient will call provider office for new concerns or questions as evidenced by review of documented incoming telephone call notes and patient report Patient will work with BSW to address care coordination needs and will continue to work with the clinical team to address health care and disease management related needs as evidenced by documented adherence to scheduled care management/care coordination appointments - avoid second hand smoke - eliminate smoking in my home - identify and remove indoor air pollutants - limit outdoor activity during cold weather - listen for public air quality announcements every day - develop a rescue plan - eliminate symptom triggers at home - follow rescue plan if symptoms flare-up - keep follow-up appointments: 03-20-2022 with pcp  - use an extra pillow to sleep - develop a new routine to improve sleep - don't eat  or exercise right before bedtime - eat healthy/prescribed diet: heart healthy diet  - get at least 7 to 8 hours of sleep at night - use devices that will help like a cane, sock-puller or reacher - practice relaxation or meditation daily - do exercises in a comfortable position that makes breathing as easy as possible - check blood pressure weekly - choose a place to take my blood pressure (home, clinic or office, retail store) - write blood pressure results in a log or diary - learn about high blood pressure - take blood pressure log to all doctor appointments - call doctor for signs and symptoms of high blood pressure - develop an action plan for high blood pressure - keep all doctor appointments - take medications for blood pressure exactly as prescribed - begin an exercise program - report new symptoms to your doctor - eat more whole grains, fruits and vegetables, lean meats and healthy fats - call for medicine refill 2 or 3 days before it runs out - take all medications exactly as prescribed - call doctor with any symptoms you believe are related to your medicine - call doctor when you experience any new symptoms - go to all doctor appointments as scheduled - adhere  to prescribed diet: Heart healthy diet        Plan:Telephone follow up appointment with care management team member scheduled for:  01-03-2022 at 145 pm  Noreene Larsson RN, MSN, [facility] Mobile: [phone]

## 2021-11-05 NOTE — Patient Instructions (Signed)
Visit Information  Thank you for taking time to visit with me today. Please don't hesitate to contact me if I can be of assistance to you before our next scheduled telephone appointment.  Following are the goals we discussed today:  RNCM Clinical Goal(s):  Patient will verbalize understanding of plan for management of HTN, HLD, COPD, and CKD Stage 3 as evidenced by following the plan of care, calling the office for changes and working with the CCM team to optimize health and well being  take all medications exactly as prescribed and will call provider for medication related questions as evidenced by taking as prescribed and calling for refills before running out of medications     attend all scheduled medical appointments: 09-19-2021 at 10 am with the pcp  as evidenced by keeping appointments and calling the office for needed schedule changes         demonstrate a decrease in HTN, HLD, COPD, and CKD Stage 3 exacerbations  as evidenced by working with the CCM team and pcp to optimize health and well being demonstrate ongoing self health care management ability for effective management of chronic conditions  as evidenced by  working with the CCM team through collaboration with Consulting civil engineer, provider, and care team.    Interventions: 1:1 collaboration with primary care provider regarding development and update of comprehensive plan of care as evidenced by provider attestation and co-signature Inter-disciplinary care team collaboration (see longitudinal plan of care) Evaluation of current treatment plan related to  self management and patient's adherence to plan as established by provider     Chronic Kidney Disease (Status: Goal on Track (progressing): YES.)  Long Term Goal  Last practice recorded BP readings:     BP Readings from Last 3 Encounters:  09/19/21 137/70  07/23/21 (!) 157/80  07/15/21 (!) 176/74  Most recent eGFR/CrCl:       Lab Results  Component Value Date    EGFR 34 (L)  09/19/2021    No components found for: CRCL   Assessed the patients spouse and DRP  understanding of chronic kidney disease. 11-05-2021: The patients spouse has a good understanding of CKD. The patient is compliant with labs and follow up with provider. The patient and spouse deny any new issues or concerns with CKD.    Evaluation of current treatment plan related to chronic kidney disease self management and patient's adherence to plan as established by provider     Provided education to patient re: stroke prevention, s/s of heart attack and stroke    Reviewed prescribed diet heart healthy/Renal  Reviewed medications with patient and discussed importance of compliance. 11-05-2021: States compliance with medications.  The patients husband assist with medications management     Advised patient, providing education and rationale, to monitor blood pressure daily and record, calling PCP for findings outside established parameters    Discussed complications of poorly controlled blood pressure such as heart disease, stroke, circulatory complications, vision complications, kidney impairment, sexual dysfunction    Discussed plans with patient for ongoing care management follow up and provided patient with direct contact information for care management team    Screening for signs and symptoms of depression related to chronic disease state      Discussed the impact of chronic kidney disease on daily life and mental health and acknowledged and normalized feelings of disempowerment, fear, and frustration    Assessed social determinant of health barriers    Provided education on kidney disease progression  Engage patient in early, proactive and ongoing discussion about goals of care and what matters most to them    Support coping and stress management by recognizing current strategies and assist in developing new strategies such as mindfulness, journaling, relaxation techniques, problem-solving        COPD:  (Status: Goal on Track (progressing): YES.) Long Term Goal  Reviewed medications with patient, including use of prescribed maintenance and rescue inhalers, and provided. 11-05-2021: Review with the patient and patients husband about appointment with pharm D on 11-11-2021 at 2 pm. The inhaler the patient takes is cost prohibitive. Education on being available to discuss options with the pharm D.  instruction on medication management and the importance of adherence Provided patient with basic written and verbal COPD education on self care/management/and exacerbation prevention Advised patient to track and manage COPD triggers. 11-05-2021: The patient and patients husband know the triggers to her COPD. Denies any acute exacerbations or findings at this time.  Provided written and verbal instructions on pursed lip breathing and utilized returned demonstration as teach back Provided instruction about proper use of medications used for management of COPD including inhalers Advised patient to self assesses COPD action plan zone and make appointment with provider if in the yellow zone for 48 hours without improvement Advised patient to engage in light exercise as tolerated 3-5 days a week to aid in the the management of COPD Provided education about and advised patient to utilize infection prevention strategies to reduce risk of respiratory infection. 11-05-2021: Review of safety measures and remaining safe in high risk season of infections with respiratory conditions presently. The patient does not go out a lot and practices safety measures to prevent infection.  Discussed the importance of adequate rest and management of fatigue with COPD. 11-05-2021: The patients husband states that sometimes he thinks the patient sleeps too much Screening for signs and symptoms of depression related to chronic disease state  Assessed social determinant of health barriers   Hyperlipidemia:  (Status: Goal on Track (progressing):  YES.) Long Term Goal       Lab Results  Component Value Date    CHOL 187 09/19/2021    HDL 55 09/19/2021    LDLCALC 107 (H) 09/19/2021    TRIG 142 09/19/2021      Medication review performed; medication list updated in electronic medical record. 11-05-2021: Is compliant with Lipitor 20 mg daily. Denies any issues with compliance.  Provider established cholesterol goals reviewed. 11-05-2021: Review of monitoring for foods high in cholesterol and eating a heart healthy diet; Counseled on importance of regular laboratory monitoring as prescribed. 11-05-2021: The patient has regular lab work for monitoring of cholesterol levels; Provided HLD educational materials; Reviewed role and benefits of statin for ASCVD risk reduction; Discussed strategies to manage statin-induced myalgias; Reviewed importance of limiting foods high in cholesterol;   Hypertension: (Status: Goal on Track (progressing): YES.) Last practice recorded BP readings:     BP Readings from Last 3 Encounters:  09/19/21 137/70  07/23/21 (!) 157/80  07/15/21 (!) 176/74  Most recent eGFR/CrCl:       Lab Results  Component Value Date    EGFR 34 (L) 09/19/2021    No components found for: CRCL   Evaluation of current treatment plan related to hypertension self management and patient's adherence to plan as established by provider. 11-05-2021: Blood pressure stable at last appointment. The patient and patients husband denies any issues or concerns with blood pressures, HTN, or heart health;  Provided education to patient re: stroke prevention, s/s of heart attack and stroke; Reviewed prescribed diet heart healthy/Renal  Reviewed medications with patient and discussed importance of compliance;  Discussed plans with patient for ongoing care management follow up and provided patient with direct contact information for care management team; Provided education on prescribed diet heart healthy- renal;  Discussed complications of poorly  controlled blood pressure such as heart disease, stroke, circulatory complications, vision complications, kidney impairment, sexual dysfunction;    Patient Goals/Self-Care Activities: Patient will self administer medications as prescribed as evidenced by self report/primary caregiver report  Patient will attend all scheduled provider appointments as evidenced by clinician review of documented attendance to scheduled appointments and patient/caregiver report Patient will call pharmacy for medication refills as evidenced by patient report and review of pharmacy fill history as appropriate Patient will attend church or other social activities as evidenced by patient report Patient will continue to perform ADL's independently as evidenced by patient/caregiver report Patient will continue to perform IADL's independently as evidenced by patient/caregiver report Patient will call provider office for new concerns or questions as evidenced by review of documented incoming telephone call notes and patient report Patient will work with BSW to address care coordination needs and will continue to work with the clinical team to address health care and disease management related needs as evidenced by documented adherence to scheduled care management/care coordination appointments - avoid second hand smoke - eliminate smoking in my home - identify and remove indoor air pollutants - limit outdoor activity during cold weather - listen for public air quality announcements every day - develop a rescue plan - eliminate symptom triggers at home - follow rescue plan if symptoms flare-up - keep follow-up appointments: 03-20-2022 with pcp  - use an extra pillow to sleep - develop a new routine to improve sleep - don't eat or exercise right before bedtime - eat healthy/prescribed diet: heart healthy diet  - get at least 7 to 8 hours of sleep at night - use devices that will help like a cane, sock-puller or reacher -  practice relaxation or meditation daily - do exercises in a comfortable position that makes breathing as easy as possible - check blood pressure weekly - choose a place to take my blood pressure (home, clinic or office, retail store) - write blood pressure results in a log or diary - learn about high blood pressure - take blood pressure log to all doctor appointments - call doctor for signs and symptoms of high blood pressure - develop an action plan for high blood pressure - keep all doctor appointments - take medications for blood pressure exactly as prescribed - begin an exercise program - report new symptoms to your doctor - eat more whole grains, fruits and vegetables, lean meats and healthy fats - call for medicine refill 2 or 3 days before it runs out - take all medications exactly as prescribed - call doctor with any symptoms you believe are related to your medicine - call doctor when you experience any new symptoms - go to all doctor appointments as scheduled - adhere to prescribed diet: Heart healthy diet       Our next appointment is by telephone on 01-03-2022 at 145 pm  Please call the care guide team at [phone] if you need to cancel or reschedule your appointment.   If you are experiencing a Mental Health or [place] or need someone to talk to, please call the Suicide and Crisis Lifeline:  988 call the [place] National Suicide Prevention Lifeline: 6238127198 or TTY: 2622554247 TTY [phone]) to talk to a trained counselor call 1-800-273-TALK (toll free, 24 hour hotline)   Patient verbalizes understanding of instructions and care plan provided today and agrees to view in [place]. Active MyChart status confirmed with patient.    Noreene Larsson RN, MSN, [facility] Mobile: [phone]

## 2021-11-08 ENCOUNTER — Telehealth: Payer: Self-pay

## 2021-11-08 NOTE — Chronic Care Management (AMB) (Signed)
° ° °  Chronic Care Management Pharmacy Assistant   Name: Madison Franklin  MRN: [id number] DOB: 07-Feb-1946  Madison Franklin is an 76 y.o. year old female who presents for his initial CCM visit with the clinical pharmacist.  Recent office visits:  09/19/21-Madison T. Ned Card, NP (PCP) Seen for a general follow up visit. Labs ordered. AMB Referral to [facility] Coordination. Flu vaccine given. Follow up in 6 months.  Recent consult visits:  07/15/21-Madison Clelia Croft, MD (General Surgery) Follow up visit on gallbladder. 06/18/21-Madison L. Patsey Berthold, MD (Pulmonology)  05/29/21-Madison C. Rao, MD (Oncology) Follow up on anemia. Follow up in 8 months.  Hospital visits:  Medication Reconciliation was completed by comparing discharge summary, patients EMR and Pharmacy list, and upon discussion with patient.  Admitted to the hospital on 07/23/21 due to Otalgia. Discharge date was 07/23/21. Discharged from [place]?Medications Started at [facility] Discharge:?? -started  Amoxicillin 500 mg take 1 cap 3x daily  Medication Changes at Hospital Discharge: -Changed None noted  Medications Discontinued at Hospital Discharge: -Stopped None noted  Medications that remain the same after Hospital Discharge:??  -All other medications will remain the same.    Medications: Outpatient Encounter Medications as of 11/08/2021  Medication Sig   acetaminophen (TYLENOL) 325 MG tablet Take 325 mg by mouth every 6 (six) hours as needed (for pain.).   albuterol (VENTOLIN HFA) 108 (90 Base) MCG/ACT inhaler Inhale 2 puffs into the lungs every 6 (six) hours as needed for wheezing or shortness of breath.   amLODipine (NORVASC) 5 MG tablet Take 1 tablet (5 mg total) by mouth daily.   aspirin EC 81 MG tablet Take 81 mg by mouth daily.   atorvastatin (LIPITOR) 20 MG tablet Take 1 tablet (20 mg total) by mouth daily.   hydrochlorothiazide (MICROZIDE) 12.5 MG capsule Take 1 capsule (12.5 mg total) by  mouth daily.   lisinopril (ZESTRIL) 40 MG tablet Take 1 tablet (40 mg total) by mouth daily.   Tiotropium Bromide Monohydrate (SPIRIVA RESPIMAT) 2.5 MCG/ACT AERS Inhale 2 puffs into the lungs daily.   Tiotropium Bromide-Olodaterol (STIOLTO RESPIMAT) 2.5-2.5 MCG/ACT AERS Inhale 2.5 mcg into the lungs 2 (two) times daily.   Tiotropium Bromide-Olodaterol 2.5-2.5 MCG/ACT AERS Inhale 2 puffs into the lungs daily.   vitamin B-12 (CYANOCOBALAMIN) 1000 MCG tablet Take 1,000 mcg by mouth daily.   Vitamin D, Cholecalciferol, 1000 units TABS Take 1 tablet by mouth daily.   No facility-administered encounter medications on file as of 11/08/2021.   Acetaminophen (TYLENOL) 325 MG tablet Last filled: Allbuterol (VENTOLIN HFA) 108 (90 Base) MCG/ACT inhaler Last filled:09/20/21 25 DS AmLODipine (NORVASC) 5 MG tablet Last filled:10/28/21 90 DS Aspirin EC 81 MG tablet Last filled:None noted Atorvastatin (LIPITOR) 20 MG tablet Last filled:10/28/21 90 DS Hydrochlorothiazide (MICROZIDE) 12.5 MG capsule Last filled:10/28/21 90 DS Lisinopril (ZESTRIL) 40 MG tablet Last filled:10/28/21 90 DS Tiotropium Bromide Monohydrate (SPIRIVA RESPIMAT) 2.5 MCG/ACT AERS Last filled:None noted Tiotropium Bromide-Olodaterol (STIOLTO RESPIMAT) 2.5-2.5 MCG/ACT AERS Last filled:None noted Vitamin B-12 (CYANOCOBALAMIN) 1000 MCG tablet Last filled:None noted Vitamin D, Cholecalciferol, 1000 units TABS Last filled:None noted  Care Gaps: Zoster Vaccines- Shingrix:Never done COVID-19 Vaccine:Overdue since 12/13/2020   Star Rating Drugs: Atorvastatin 20 mg Last filled:10/28/21 90 DS  Madison Elta [place], [place]

## 2021-11-10 DIAGNOSIS — J449 Chronic obstructive pulmonary disease, unspecified: Secondary | ICD-10-CM | POA: Diagnosis not present

## 2021-11-11 ENCOUNTER — Ambulatory Visit: Payer: Medicare Other

## 2021-11-11 DIAGNOSIS — E782 Mixed hyperlipidemia: Secondary | ICD-10-CM

## 2021-11-11 DIAGNOSIS — J449 Chronic obstructive pulmonary disease, unspecified: Secondary | ICD-10-CM

## 2021-11-11 DIAGNOSIS — I10 Essential (primary) hypertension: Secondary | ICD-10-CM

## 2021-11-11 NOTE — Patient Instructions (Signed)
Ms. Higham,  Thank you for talking with me today. I have included our care plan/goals in the following pages.   Please review and call me at [phone] with any questions.  Thanks! Ellin Mayhew, PharmD Clinical Pharmacist  [phone]  Care Plan : ccm pharmacy care plan  Updates made by Madelin Rear, [facility] since 11/11/2021 12:00 AM     Problem: HLD HTN DMII COPD OSA CKD   Priority: High     Long-Range Goal: disease state management   Start Date: 11/11/2021  This Visit's Progress: On track  Priority: High  Note:   Current Barriers:  Unable to independently afford treatment regimen Unable to achieve control of COPD   Pharmacist Clinical Goal(s):  Patient will verbalize ability to afford treatment regimen through collaboration with PharmD and provider.   Interventions: 1:1 collaboration with Venita Lick, NP regarding development and update of comprehensive plan of care as evidenced by provider attestation and co-signature Inter-disciplinary care team collaboration (see longitudinal plan of care) Comprehensive medication review performed; medication list updated in electronic medical record  Hypertension (BP goal <130/80) -Controlled per home readings -Current treatment: Lisinopril 40 mg once daily Amlodipine 5 mg once daily  HCTZ 12.5 mg once daily  -Current home readings: 130/70 typically - 128/69 on 11/10/21 -Current exercise habits: limited due to SOB  -Denies hypotensive/hypertensive symptoms -Educated on BP goals and benefits of medications for prevention of heart attack, stroke and kidney damage; Proper BP monitoring technique; -Counseled to monitor BP at home 1-2x/wk, document, and provide log at future appointments -Recommended to continue current medication  Hyperlipidemia: (LDL goal < 70) -Uncontrolled -Current treatment: Atorvastatin 20 mg once daily -Medications previously tried: n/a, noted lower dose of atorvastatin 10mg , otherwise no  other medications noted  -Reviewed for side effects no problems noted -Educated on Cholesterol goals;  -Recommended to continue current medication  COPD (Goal: control symptoms and prevent exacerbations) -Not ideally controlled -Current treatment  Albuterol rescue inhaler Spiriva respimat 2 puffs daily  -Medications previously tried: n/a- could not afford stiolto   -Gold Grade: Gold 3 (FEV1 30-49%) -Current COPD Classification:  B (high sx, <2 exacerbations/yr) -Pulmonary function testing: 2021 -Exacerbations requiring treatment in last 6 months: n/a -Patient reports consistent use of maintenance inhaler -Frequency of rescue inhaler use: daily -Counseled on Proper inhaler technique; Benefits of consistent maintenance inhaler use When to use rescue inhaler Differences between maintenance and rescue inhalers -Recommended to continue current medication Assessed patient finances. Based off initial review should qualify for stiolto pap   Patient Goals/Self-Care Activities Patient will:  - take medications as prescribed as evidenced by patient report and record review collaborate with provider on medication access solutions  Medication Assistance: Application for Stiolto  medication assistance program. in process.  Anticipated assistance start date 11/2021.  See plan of care for additional detail.  Patient's preferred pharmacy is:  [facility], [place] [address] Phone: [phone] Fax: [phone]  [facility] ([place], [place] [facility] [address] [place] [address] [place] [address] Phone: [phone] Fax: [phone]   Pt endorses 100% compliance  Follow Up:  Patient agrees to Care Plan and Follow-up. Plan: HC to send stiolto pap - one month f.u call to ensure completion. Pharmacist 3 month f/u call.     The patient verbalized understanding of instructions provided  today and agreed  to receive a MyChart copy of patient instruction and/or educational materials. Telephone follow up appointment with pharmacy team member scheduled for: See next appointment with "Care Management Staff" under "What's Next" below.  Hypertension, Adult High blood pressure (hypertension) is when the force of blood pumping through the arteries is too strong. The arteries are the blood vessels that carry blood from the heart throughout the body. Hypertension forces the heart to work harder to pump blood and may cause arteries to become narrow or stiff. Untreated or uncontrolled hypertension can cause a heart attack, heart failure, a stroke, kidney disease, and other problems. A blood pressure reading consists of a higher number over a lower number. Ideally, your blood pressure should be below 120/80. The first ("top") number is called the systolic pressure. It is a measure of the pressure in your arteries as your heart beats. The second ("bottom") number is called the diastolic pressure. It is a measure of the pressure in your arteries as the heart relaxes. What are the causes? The exact cause of this condition is not known. There are some conditions that result in or are related to high blood pressure. What increases the risk? Some risk factors for high blood pressure are under your control. The following factors may make you more likely to develop this condition: Smoking. Having type 2 diabetes mellitus, high cholesterol, or both. Not getting enough exercise or physical activity. Being overweight. Having too much fat, sugar, calories, or salt (sodium) in your diet. Drinking too much alcohol. Some risk factors for high blood pressure may be difficult or impossible to change. Some of these factors include: Having chronic kidney disease. Having a family history of high blood pressure. Age. Risk increases with age. Race. You may be at higher risk if you are African American. Gender. Men  are at higher risk than women before age 90. After age 66, women are at higher risk than men. Having obstructive sleep apnea. Stress. What are the signs or symptoms? High blood pressure may not cause symptoms. Very high blood pressure (hypertensive crisis) may cause: Headache. Anxiety. Shortness of breath. Nosebleed. Nausea and vomiting. Vision changes. Severe chest pain. Seizures. How is this diagnosed? This condition is diagnosed by measuring your blood pressure while you are seated, with your arm resting on a flat surface, your legs uncrossed, and your feet flat on the floor. The cuff of the blood pressure monitor will be placed directly against the skin of your upper arm at the level of your heart. It should be measured at least twice using the same arm. Certain conditions can cause a difference in blood pressure between your right and left arms. Certain factors can cause blood pressure readings to be lower or higher than normal for a short period of time: When your blood pressure is higher when you are in a health care provider's office than when you are at home, this is called white coat hypertension. Most people with this condition do not need medicines. When your blood pressure is higher at home than when you are in a health care provider's office, this is called masked hypertension. Most people with this condition may need medicines to control blood pressure. If you have a high blood pressure reading during one visit or you have normal blood pressure with other risk factors, you may be asked to: Return on a different day to have your blood pressure checked again. Monitor your blood pressure at home for 1 week or longer. If you are diagnosed  with hypertension, you may have other blood or imaging tests to help your health care provider understand your overall risk for other conditions. How is this treated? This condition is treated by making healthy lifestyle changes, such as eating  healthy foods, exercising more, and reducing your alcohol intake. Your health care provider may prescribe medicine if lifestyle changes are not enough to get your blood pressure under control, and if: Your systolic blood pressure is above 130. Your diastolic blood pressure is above 80. Your personal target blood pressure may vary depending on your medical conditions, your age, and other factors. Follow these instructions at home: Eating and drinking  Eat a diet that is high in fiber and potassium, and low in sodium, added sugar, and fat. An example eating plan is called the DASH (Dietary Approaches to Stop Hypertension) diet. To eat this way: Eat plenty of fresh fruits and vegetables. Try to fill one half of your plate at each meal with fruits and vegetables. Eat whole grains, such as whole-wheat pasta, brown rice, or whole-grain bread. Fill about one fourth of your plate with whole grains. Eat or drink low-fat dairy products, such as skim milk or low-fat yogurt. Avoid fatty cuts of meat, processed or cured meats, and poultry with skin. Fill about one fourth of your plate with lean proteins, such as fish, chicken without skin, beans, eggs, or tofu. Avoid pre-made and processed foods. These tend to be higher in sodium, added sugar, and fat. Reduce your daily sodium intake. Most people with hypertension should eat less than 1,500 mg of sodium a day. Do not drink alcohol if: Your health care provider tells you not to drink. You are pregnant, may be pregnant, or are planning to become pregnant. If you drink alcohol: Limit how much you use to: 0-1 drink a day for women. 0-2 drinks a day for men. Be aware of how much alcohol is in your drink. In the [place] one drink equals one 12 oz bottle of beer (355 mL), one 5 oz glass of wine (148 mL), or one 1 oz glass of hard liquor (44 mL). Lifestyle  Work with your health care provider to maintain a healthy body weight or to lose weight. Ask what an ideal  weight is for you. Get at least 30 minutes of exercise most days of the week. Activities may include walking, swimming, or biking. Include exercise to strengthen your muscles (resistance exercise), such as Pilates or lifting weights, as part of your weekly exercise routine. Try to do these types of exercises for 30 minutes at least 3 days a week. Do not use any products that contain nicotine or tobacco, such as cigarettes, e-cigarettes, and chewing tobacco. If you need help quitting, ask your health care provider. Monitor your blood pressure at home as told by your health care provider. Keep all follow-up visits as told by your health care provider. This is important. Medicines Take over-the-counter and prescription medicines only as told by your health care provider. Follow directions carefully. Blood pressure medicines must be taken as prescribed. Do not skip doses of blood pressure medicine. Doing this puts you at risk for problems and can make the medicine less effective. Ask your health care provider about side effects or reactions to medicines that you should watch for. Contact a health care provider if you: Think you are having a reaction to a medicine you are taking. Have headaches that keep coming back (recurring). Feel dizzy. Have swelling in your ankles. Have trouble with  your vision. Get help right away if you: Develop a severe headache or confusion. Have unusual weakness or numbness. Feel faint. Have severe pain in your chest or abdomen. Vomit repeatedly. Have trouble breathing. Summary Hypertension is when the force of blood pumping through your arteries is too strong. If this condition is not controlled, it may put you at risk for serious complications. Your personal target blood pressure may vary depending on your medical conditions, your age, and other factors. For most people, a normal blood pressure is less than 120/80. Hypertension is treated with lifestyle changes,  medicines, or a combination of both. Lifestyle changes include losing weight, eating a healthy, low-sodium diet, exercising more, and limiting alcohol. This information is not intended to replace advice given to you by your health care provider. Make sure you discuss any questions you have with your health care provider. Document Revised: 06/16/2018 Document Reviewed: 06/16/2018 Elsevier Patient Education  [place].

## 2021-11-11 NOTE — Progress Notes (Signed)
Chronic Care Management Pharmacy Note  11/11/2021 Name:  Madison Franklin MRN:  [id number] DOB:  1946-09-27  Summary: COPD - should qualify for Stiolto PAP based off of initial review, will send application Reports to have scheduled Neph f/u w/ Dr Holley Raring at [facility], specific date not provided   Subjective: Madison Franklin is an 76 y.o. year old female who is a primary patient of Cannady, Barbaraann Faster, NP.  The CCM team was consulted for assistance with disease management and care coordination needs.    Engaged with patient by telephone for follow up visit in response to provider referral for pharmacy case management and/or care coordination services.   Consent to Services:  The patient was given information about Chronic Care Management services, agreed to services, and gave verbal consent prior to initiation of services.  Please see initial visit note for detailed documentation.   Patient Care Team: Venita Lick, NP as PCP - General (Nurse Practitioner) [name], MD as Consulting Physician (Gastroenterology) Anthonette Legato, MD (Internal Medicine) Jules Husbands, MD as Consulting Physician (General Surgery) Vanita Ingles, RN as Case Manager (General Practice) Madelin Rear, [facility] (Pharmacist)  Objective:  Lab Results  Component Value Date   CREATININE 1.56 (H) 09/19/2021   CREATININE 1.35 (H) 03/20/2021   CREATININE 1.43 (H) 11/29/2020    Lab Results  Component Value Date   HGBA1C 4.8 09/19/2021   HGBA1C 5.2 03/20/2021   Last diabetic Eye exam: No results found for: HMDIABEYEEXA  Last diabetic Foot exam: No results found for: HMDIABFOOTEX      Component Value Date/Time   CHOL 187 09/19/2021 1016   CHOL 192 04/20/2020 1419   CHOL 181 11/24/2017 1033   TRIG 142 09/19/2021 1016   TRIG 205 (H) 04/20/2020 1419   TRIG 142 11/24/2017 1033   HDL 55 09/19/2021 1016   HDL 52 04/20/2020 1419   HDL 51 11/24/2017 1033   LDLCALC 107 (H) 09/19/2021 1016   LDLCALC 105  (H) 04/20/2020 1419   LDLCALC 102 (H) 11/24/2017 1033   LDLCALC 108 (H) 04/13/2017 1202   [place] 92 09/29/2016 1050    Hepatic Function Latest Ref Rng & Units 03/20/2021 08/03/2018 07/25/2018  Total Protein 6.0 - 8.5 g/dL 7.3 7.9 7.6  Albumin 3.7 - 4.7 g/dL 4.6 4.2 4.3  AST 0 - 40 IU/L '15 16 17  ' ALT 0 - 32 IU/L '7 8 8  ' Alk Phosphatase 44 - 121 IU/L 101 65 69  Total Bilirubin 0.0 - 1.2 mg/dL 0.4 0.4 0.6    Lab Results  Component Value Date/Time   TSH 0.623 03/20/2021 03:29 PM   TSH 0.999 04/20/2020 02:19 PM    CBC Latest Ref Rng & Units 10/03/2021 05/29/2021 03/20/2021  WBC 4.0 - 10.5 K/uL 8.0 8.7 8.9  Hemoglobin 12.0 - 15.0 g/dL 11.2(L) 10.8(L) 10.7(L)  Hematocrit 36.0 - 46.0 % 36.6 35.5(L) 36.1  Platelets 150 - 400 K/uL 157 174 172    Lab Results  Component Value Date/Time   VD25OH 45.9 09/19/2021 10:16 AM   VD25OH 23.2 (L) 11/24/2017 10:33 AM    Clinical ASCVD:  The ASCVD Risk score (Arnett DK, et al., 2019) failed to calculate for the following reasons:   The systolic blood pressure is missing   Social History   Tobacco Use  Smoking Status Former   Packs/day: 0.50   Years: 30.00   Pack years: 15.00   Types: Cigarettes   Quit date: 01/17/2017   Years since  quitting: 4.8  Smokeless Tobacco Never   BP Readings from Last 3 Encounters:  09/19/21 137/70  07/23/21 (!) 157/80  07/15/21 (!) 176/74   Pulse Readings from Last 3 Encounters:  09/19/21 78  07/23/21 83  07/15/21 88   Wt Readings from Last 3 Encounters:  09/19/21 164 lb 9.6 oz (74.7 kg)  07/23/21 165 lb (74.8 kg)  07/15/21 162 lb 3.2 oz (73.6 kg)   BMI Readings from Last 3 Encounters:  09/19/21 31.10 kg/m  07/23/21 31.18 kg/m  07/15/21 30.65 kg/m    Assessment: Review of patient past medical history, allergies, medications, health status, including review of consultants reports, laboratory and other test data, was performed as part of comprehensive evaluation and provision of chronic care  management services.   SDOH:  (Social Determinants of Health) assessments and interventions performed: Yes  SDOH Interventions    Flowsheet Row Most Recent Value  SDOH Interventions   Food Insecurity Interventions Intervention Not Indicated  Transportation Interventions Intervention Not Indicated       CCM Care Plan  Allergies  Allergen Reactions   Latex Itching and Rash    With latex gloves (when worn)    Medications Reviewed Today     Reviewed by Madelin Rear, [facility] (Pharmacist) on 11/11/21 at 1456  Med List Status: <None>   Medication Order Taking? Sig Documenting Provider Last Dose Status Informant  acetaminophen (TYLENOL) 325 MG tablet [id number] No Take 325 mg by mouth every 6 (six) hours as needed (for pain.). [provider] Taking Active Self  albuterol (VENTOLIN HFA) 108 (90 Base) MCG/ACT inhaler [id number] No Inhale 2 puffs into the lungs every 6 (six) hours as needed for wheezing or shortness of breath. Tyler Pita, MD Taking Active   amLODipine (NORVASC) 5 MG tablet [id number] No Take 1 tablet (5 mg total) by mouth daily. Marnee Guarneri T, NP Taking Active   aspirin EC 81 MG tablet [id number] No Take 81 mg by mouth daily. [provider] Taking Active Self  atorvastatin (LIPITOR) 20 MG tablet [id number] No Take 1 tablet (20 mg total) by mouth daily. Marnee Guarneri T, NP Taking Active   hydrochlorothiazide (MICROZIDE) 12.5 MG capsule [id number] No Take 1 capsule (12.5 mg total) by mouth daily. Marnee Guarneri T, NP Taking Active   lisinopril (ZESTRIL) 40 MG tablet [id number] No Take 1 tablet (40 mg total) by mouth daily. Marnee Guarneri T, NP Taking Active   Tiotropium Bromide Monohydrate (SPIRIVA RESPIMAT) 2.5 MCG/ACT AERS [id number] No Inhale 2 puffs into the lungs daily. Martyn Ehrich, NP Taking Active   Tiotropium Bromide-Olodaterol (STIOLTO RESPIMAT) 2.5-2.5 MCG/ACT AERS [id number] No Inhale 2.5 mcg into the lungs 2 (two) times daily. Tyler Pita, MD Taking Active   Tiotropium Bromide-Olodaterol 2.5-2.5 MCG/ACT AERS [id number] No Inhale 2 puffs into the lungs daily. Marnee Guarneri T, NP Taking Active   vitamin B-12 (CYANOCOBALAMIN) 1000 MCG tablet [id number] No Take 1,000 mcg by mouth daily. [provider] Taking Active   Vitamin D, Cholecalciferol, 1000 units TABS [id number] No Take 1 tablet by mouth daily. [provider] Taking Active Self            Patient Active Problem List   Diagnosis Date Noted   Mold exposure 03/20/2021   OSA (obstructive sleep apnea) 03/20/2021   IFG (impaired fasting glucose) 03/20/2021   Aortic atherosclerosis ([place]) 07/29/2020   Chronic respiratory failure with hypoxia ([place]) 07/29/2020   severe restrictive pulmonary physiology due to severe kyphoscoliosis 07/04/2020  Porcelain gallbladder 11/30/2019   Severe pulmonary hypertension ([place]) 10/02/2019   Intestinal metaplasia of gastric mucosa    Chronic atrophic gastritis without bleeding 12/15/2018   B12 deficiency 08/04/2018   Nodule of upper lobe of left lung 05/26/2018   Anemia in chronic kidney disease 05/26/2018   Coronary artery calcification 06/18/2017   Heart murmur 04/13/2017   Chronic kidney disease, stage 3 ([place]) 04/13/2017   Advanced care planning/counseling discussion 01/05/2017   Hx of colonic polyps    Benign neoplasm of cecum    Osteopenia 07/26/2015   COPD, severe ([place]) 07/26/2015   Essential hypertension 07/26/2015   Hyperlipidemia 07/26/2015    Immunization History  Administered Date(s) Administered   Fluad Quad(high Dose 65+) 08/03/2020, 09/19/2021   Influenza, High Dose Seasonal PF 09/03/2016   Influenza,inj,Quad PF,6+ Mos 07/27/2015   Influenza,inj,quad, With Preservative 08/24/2018, 07/15/2019   Influenza-Unspecified 08/14/2017, 08/24/2018   Moderna Sars-Covid-2 Vaccination 10/18/2020   PFIZER(Purple Top)SARS-COV-2 Vaccination 12/15/2019, 01/11/2020   Pneumococcal Conjugate-13  01/12/2015   Pneumococcal Polysaccharide-23 07/01/2011   Td 07/27/2015    Conditions to be addressed/monitored: HLD HTN DMII COPD OSA CKD  Care Plan : ccm pharmacy care plan  Updates made by Madelin Rear, [facility] since 11/11/2021 12:00 AM     Problem: HLD HTN DMII COPD OSA CKD   Priority: High     Long-Range Goal: disease state management   Start Date: 11/11/2021  This Visit's Progress: On track  Priority: High  Note:   Current Barriers:  Unable to independently afford treatment regimen Unable to achieve control of COPD   Pharmacist Clinical Goal(s):  Patient will verbalize ability to afford treatment regimen through collaboration with PharmD and provider.   Interventions: 1:1 collaboration with Venita Lick, NP regarding development and update of comprehensive plan of care as evidenced by provider attestation and co-signature Inter-disciplinary care team collaboration (see longitudinal plan of care) Comprehensive medication review performed; medication list updated in electronic medical record  Hypertension (BP goal <130/80) -Controlled per home readings -Current treatment: Lisinopril 40 mg once daily Amlodipine 5 mg once daily  HCTZ 12.5 mg once daily  -Current home readings: 130/70 typically - 128/69 on 11/10/21 -Current exercise habits: limited due to SOB  -Denies hypotensive/hypertensive symptoms -Educated on BP goals and benefits of medications for prevention of heart attack, stroke and kidney damage; Proper BP monitoring technique; -Counseled to monitor BP at home 1-2x/wk, document, and provide log at future appointments -Recommended to continue current medication  Hyperlipidemia: (LDL goal < 70) -Uncontrolled -Current treatment: Atorvastatin 20 mg once daily -Medications previously tried: n/a, noted lower dose of atorvastatin 90m, otherwise no other medications noted  -Reviewed for side effects no problems noted -Educated on Cholesterol goals;   -Recommended to continue current medication  COPD (Goal: control symptoms and prevent exacerbations) -Not ideally controlled -Current treatment  Albuterol rescue inhaler Spiriva respimat 2 puffs daily  -Medications previously tried: n/a- could not afford stiolto   -Gold Grade: Gold 3 (FEV1 30-49%) -Current COPD Classification:  B (high sx, <2 exacerbations/yr) -Pulmonary function testing: 2021 -Exacerbations requiring treatment in last 6 months: n/a -Patient reports consistent use of maintenance inhaler -Frequency of rescue inhaler use: daily -Counseled on Proper inhaler technique; Benefits of consistent maintenance inhaler use When to use rescue inhaler Differences between maintenance and rescue inhalers -Recommended to continue current medication Assessed patient finances. Based off initial review should qualify for stiolto pap   Patient Goals/Self-Care Activities Patient will:  - take medications as prescribed as evidenced by patient  report and record review collaborate with provider on medication access solutions  Medication Assistance: Application for Stiolto  medication assistance program. in process.  Anticipated assistance start date 11/2021.  See plan of care for additional detail.  Patient's preferred pharmacy is:  [facility], [place] [address] Phone: [phone] Fax: [phone]  [facility] ([place], [place] [facility] [address] [place] [address] [place] [address] Phone: [phone] Fax: [phone]   Pt endorses 100% compliance  Follow Up:  Patient agrees to Care Plan and Follow-up. Plan: HC to send stiolto pap - one month f.u call to ensure completion. Pharmacist 3 month f/u call.      Future Appointments  Date Time Provider [place]  01/03/2022  1:45 PM CFP CCM CASE MANAGER [facility]  01/29/2022 11:15 AM [name], MD [facility]  None  02/10/2022  3:00 PM CFP CCM PHARMACY [facility]  03/20/2022  8:00 AM Venita Lick, NP [facility] P[facility]   Madelin Rear, PharmD, [facility] Clinical Pharmacist  [facility]  [phone]

## 2021-11-15 ENCOUNTER — Telehealth: Payer: Self-pay

## 2021-11-15 NOTE — Chronic Care Management (AMB) (Signed)
° ° °  Chronic Care Management Pharmacy Assistant   Name: Madison Franklin  MRN: [id number] DOB: 22-Dec-1945  Reason for Encounter: Patient Assistance application for Stiolto Inhaler    Medications: Outpatient Encounter Medications as of 11/15/2021  Medication Sig   acetaminophen (TYLENOL) 325 MG tablet Take 325 mg by mouth every 6 (six) hours as needed (for pain.).   albuterol (VENTOLIN HFA) 108 (90 Base) MCG/ACT inhaler Inhale 2 puffs into the lungs every 6 (six) hours as needed for wheezing or shortness of breath.   amLODipine (NORVASC) 5 MG tablet Take 1 tablet (5 mg total) by mouth daily.   aspirin EC 81 MG tablet Take 81 mg by mouth daily.   atorvastatin (LIPITOR) 20 MG tablet Take 1 tablet (20 mg total) by mouth daily.   hydrochlorothiazide (MICROZIDE) 12.5 MG capsule Take 1 capsule (12.5 mg total) by mouth daily.   lisinopril (ZESTRIL) 40 MG tablet Take 1 tablet (40 mg total) by mouth daily.   Tiotropium Bromide Monohydrate (SPIRIVA RESPIMAT) 2.5 MCG/ACT AERS Inhale 2 puffs into the lungs daily.   Tiotropium Bromide-Olodaterol (STIOLTO RESPIMAT) 2.5-2.5 MCG/ACT AERS Inhale 2.5 mcg into the lungs 2 (two) times daily.   Tiotropium Bromide-Olodaterol 2.5-2.5 MCG/ACT AERS Inhale 2 puffs into the lungs daily.   vitamin B-12 (CYANOCOBALAMIN) 1000 MCG tablet Take 1,000 mcg by mouth daily.   Vitamin D, Cholecalciferol, 1000 units TABS Take 1 tablet by mouth daily.   No facility-administered encounter medications on file as of 11/15/2021.   I have spoke to patients spouse about patient assistance application on Stiolto. I have mentioned specific details to him that he will be receiving the application through mail and will need to complete highlighted areas of the form. Once form is completed he will need to bring the form back to the office so the patients PCP can sign the form as well. Patient states he understood the directions. I have also left my contact information on the directions incase the  patient and her spouse need any guidance.   Corrie Mckusick, [place]

## 2021-11-19 DIAGNOSIS — I10 Essential (primary) hypertension: Secondary | ICD-10-CM | POA: Diagnosis not present

## 2021-11-19 DIAGNOSIS — D631 Anemia in chronic kidney disease: Secondary | ICD-10-CM | POA: Diagnosis not present

## 2021-11-19 DIAGNOSIS — E782 Mixed hyperlipidemia: Secondary | ICD-10-CM

## 2021-11-19 DIAGNOSIS — N1831 Chronic kidney disease, stage 3a: Secondary | ICD-10-CM

## 2021-11-19 DIAGNOSIS — N1832 Chronic kidney disease, stage 3b: Secondary | ICD-10-CM | POA: Diagnosis not present

## 2021-11-19 DIAGNOSIS — J449 Chronic obstructive pulmonary disease, unspecified: Secondary | ICD-10-CM | POA: Diagnosis not present

## 2021-11-22 ENCOUNTER — Telehealth: Payer: Self-pay | Admitting: Nurse Practitioner

## 2021-11-22 NOTE — Telephone Encounter (Signed)
Patient's husband dropped off [place] patient assistance paperwork to be completed by provider and faxed to [phone] when completed.  Patient does not need to be contacted unless there are any questions.  Placed in provider's folder.

## 2021-12-06 DIAGNOSIS — J449 Chronic obstructive pulmonary disease, unspecified: Secondary | ICD-10-CM | POA: Diagnosis not present

## 2021-12-11 DIAGNOSIS — J449 Chronic obstructive pulmonary disease, unspecified: Secondary | ICD-10-CM | POA: Diagnosis not present

## 2021-12-29 ENCOUNTER — Other Ambulatory Visit: Payer: Self-pay | Admitting: Nurse Practitioner

## 2021-12-29 DIAGNOSIS — I10 Essential (primary) hypertension: Secondary | ICD-10-CM

## 2021-12-29 DIAGNOSIS — F17200 Nicotine dependence, unspecified, uncomplicated: Secondary | ICD-10-CM

## 2021-12-30 NOTE — Telephone Encounter (Signed)
Requested Prescriptions  ?Pending Prescriptions Disp Refills  ?? atorvastatin (LIPITOR) 20 MG tablet [Pharmacy Med Name: Atorvastatin Calcium 20 MG Oral Tablet] 90 tablet 2  ?  Sig: TAKE 1 TABLET BY MOUTH  DAILY  ?  ? Cardiovascular:  Antilipid - Statins Failed - 12/29/2021  5:52 AM  ?  ?  Failed - Lipid Panel in normal range within the last 12 months  ?  Cholesterol, Total  ?Date Value Ref Range Status  ?09/19/2021 187 100 - 199 mg/dL Final  ? ?LDL Chol Calc (NIH)  ?Date Value Ref Range Status  ?09/19/2021 107 (H) 0 - 99 mg/dL Final  ? ?HDL  ?Date Value Ref Range Status  ?09/19/2021 55 >39 mg/dL Final  ? ?Triglycerides  ?Date Value Ref Range Status  ?09/19/2021 142 0 - 149 mg/dL Final  ? ?  ?  ?  Passed - Patient is not pregnant  ?  ?  Passed - Valid encounter within last 12 months  ?  Recent Outpatient Visits   ?      ? 3 months ago Severe pulmonary hypertension ([place])  ? [facility] [place], [place] T, NP  ? 9 months ago Pulmonary arterial hypertension ([place])  ? [place], Henrine Screws T, NP  ? 1 year ago COPD, severe ([place])  ? [place], Henrine Screws T, NP  ? 1 year ago COPD, severe ([place])  ? [place], [place] T, NP  ? 2 years ago COPD, severe ([place])  ? [facility] [place], Henrine Screws T, NP  ?  ?  ?Future Appointments   ?        ? In 2 months Cannady, Barbaraann Faster, NP [facility], [facility]  ?  ? ?  ?  ?  ?? lisinopril (ZESTRIL) 40 MG tablet [Pharmacy Med Name: Lisinopril 40 MG Oral Tablet] 90 tablet 0  ?  Sig: TAKE 1 TABLET BY MOUTH  DAILY  ?  ? Cardiovascular:  ACE Inhibitors Failed - 12/29/2021  5:52 AM  ?  ?  Failed - Cr in normal range and within 180 days  ?  Creatinine, Ser  ?Date Value Ref Range Status  ?09/19/2021 1.56 (H) 0.57 - 1.00 mg/dL Final  ?   ?  ?  Failed - K in normal range and within 180 days  ?  Potassium  ?Date Value Ref Range Status  ?09/19/2021 5.3 (H) 3.5 - 5.2 mmol/L Final  ?   ?  ?  Passed - Patient is not  pregnant  ?  ?  Passed - Last BP in normal range  ?  BP Readings from Last 1 Encounters:  ?09/19/21 137/70  ?   ?  ?  Passed - Valid encounter within last 6 months  ?  Recent Outpatient Visits   ?      ? 3 months ago Severe pulmonary hypertension ([place])  ? [facility] [place], [place] T, NP  ? 9 months ago Pulmonary arterial hypertension ([place])  ? [place], Henrine Screws T, NP  ? 1 year ago COPD, severe ([place])  ? [place], Henrine Screws T, NP  ? 1 year ago COPD, severe ([place])  ? [place], [place] T, NP  ? 2 years ago COPD, severe ([place])  ? [facility] [place], Henrine Screws T, NP  ?  ?  ?Future Appointments   ?        ? In 2 months Cannady, [place] T,  NP [facility]  ?  ? ?  ?  ?  ?? amLODipine (NORVASC) 5 MG tablet [Pharmacy Med Name: amLODIPine [place] 5 MG Oral Tablet] 90 tablet 0  ?  Sig: TAKE 1 TABLET BY MOUTH  DAILY  ?  ? Cardiovascular: Calcium Channel Blockers 2 Passed - 12/29/2021  5:52 AM  ?  ?  Passed - Last BP in normal range  ?  BP Readings from Last 1 Encounters:  ?09/19/21 137/70  ?   ?  ?  Passed - Last Heart Rate in normal range  ?  Pulse Readings from Last 1 Encounters:  ?09/19/21 78  ?   ?  ?  Passed - Valid encounter within last 6 months  ?  Recent Outpatient Visits   ?      ? 3 months ago Severe pulmonary hypertension ([place])  ? [facility] [place], [place] T, NP  ? 9 months ago Pulmonary arterial hypertension ([place])  ? [place], Henrine Screws T, NP  ? 1 year ago COPD, severe ([place])  ? [place], Henrine Screws T, NP  ? 1 year ago COPD, severe ([place])  ? [place], [place] T, NP  ? 2 years ago COPD, severe ([place])  ? [facility] [place], Henrine Screws T, NP  ?  ?  ?Future Appointments   ?        ? In 2 months Cannady, Barbaraann Faster, NP [facility], [facility]  ?  ? ?  ?  ?  ? ?

## 2021-12-31 DIAGNOSIS — G4733 Obstructive sleep apnea (adult) (pediatric): Secondary | ICD-10-CM | POA: Diagnosis not present

## 2022-01-03 ENCOUNTER — Telehealth: Payer: Self-pay

## 2022-01-03 ENCOUNTER — Ambulatory Visit (INDEPENDENT_AMBULATORY_CARE_PROVIDER_SITE_OTHER): Payer: Medicare Other

## 2022-01-03 ENCOUNTER — Telehealth: Payer: Medicare Other

## 2022-01-03 DIAGNOSIS — N1832 Chronic kidney disease, stage 3b: Secondary | ICD-10-CM

## 2022-01-03 DIAGNOSIS — J449 Chronic obstructive pulmonary disease, unspecified: Secondary | ICD-10-CM

## 2022-01-03 DIAGNOSIS — I10 Essential (primary) hypertension: Secondary | ICD-10-CM

## 2022-01-03 DIAGNOSIS — N1831 Chronic kidney disease, stage 3a: Secondary | ICD-10-CM

## 2022-01-03 DIAGNOSIS — E782 Mixed hyperlipidemia: Secondary | ICD-10-CM

## 2022-01-03 NOTE — Chronic Care Management (AMB) (Signed)
?Chronic Care Management  ? ?CCM RN Visit Note ? ?01/03/2022 ?Name: Madison Franklin MRN: [id number] DOB: July 17, 1946 ? ?Subjective: ?Madison Franklin is a 76 y.o. year old female who is a primary care patient of Cannady, Barbaraann Faster, NP. The care management team was consulted for assistance with disease management and care coordination needs.   ? ?Engaged with patient by telephone for follow up visit in response to provider referral for case management and/or care coordination services.  ? ?Consent to Services:  ?The patient was given information about Chronic Care Management services, agreed to services, and gave verbal consent prior to initiation of services.  Please see initial visit note for detailed documentation.  ? ?Patient agreed to services and verbal consent obtained.  ? ?Assessment: Review of patient past medical history, allergies, medications, health status, including review of consultants reports, laboratory and other test data, was performed as part of comprehensive evaluation and provision of chronic care management services.  ? ?SDOH (Social Determinants of Health) assessments and interventions performed:   ? ?CCM Care Plan ? ?Allergies  ?Allergen Reactions  ? Latex Itching and Rash  ?  With latex gloves (when worn)  ? ? ?Outpatient Encounter Medications as of 01/03/2022  ?Medication Sig  ? acetaminophen (TYLENOL) 325 MG tablet Take 325 mg by mouth every 6 (six) hours as needed (for pain.).  ? albuterol (VENTOLIN HFA) 108 (90 Base) MCG/ACT inhaler Inhale 2 puffs into the lungs every 6 (six) hours as needed for wheezing or shortness of breath.  ? amLODipine (NORVASC) 5 MG tablet TAKE 1 TABLET BY MOUTH  DAILY  ? aspirin EC 81 MG tablet Take 81 mg by mouth daily.  ? atorvastatin (LIPITOR) 20 MG tablet TAKE 1 TABLET BY MOUTH  DAILY  ? hydrochlorothiazide (MICROZIDE) 12.5 MG capsule Take 1 capsule (12.5 mg total) by mouth daily.  ? lisinopril (ZESTRIL) 40 MG tablet TAKE 1 TABLET BY MOUTH  DAILY  ? Tiotropium Bromide  Monohydrate (SPIRIVA RESPIMAT) 2.5 MCG/ACT AERS Inhale 2 puffs into the lungs daily.  ? Tiotropium Bromide-Olodaterol (STIOLTO RESPIMAT) 2.5-2.5 MCG/ACT AERS Inhale 2.5 mcg into the lungs 2 (two) times daily.  ? Tiotropium Bromide-Olodaterol 2.5-2.5 MCG/ACT AERS Inhale 2 puffs into the lungs daily.  ? vitamin B-12 (CYANOCOBALAMIN) 1000 MCG tablet Take 1,000 mcg by mouth daily.  ? Vitamin D, Cholecalciferol, 1000 units TABS Take 1 tablet by mouth daily.  ? ?No facility-administered encounter medications on file as of 01/03/2022.  ? ? ?Patient Active Problem List  ? Diagnosis Date Noted  ? Mold exposure 03/20/2021  ? OSA (obstructive sleep apnea) 03/20/2021  ? IFG (impaired fasting glucose) 03/20/2021  ? Aortic atherosclerosis ([place]) 07/29/2020  ? Chronic respiratory failure with hypoxia ([place]) 07/29/2020  ? severe restrictive pulmonary physiology due to severe kyphoscoliosis 07/04/2020  ? Porcelain gallbladder 11/30/2019  ? Severe pulmonary hypertension ([place]) 10/02/2019  ? Intestinal metaplasia of gastric mucosa   ? Chronic atrophic gastritis without bleeding 12/15/2018  ? B12 deficiency 08/04/2018  ? Nodule of upper lobe of left lung 05/26/2018  ? Anemia in chronic kidney disease 05/26/2018  ? Coronary artery calcification 06/18/2017  ? Heart murmur 04/13/2017  ? Chronic kidney disease, stage 3 ([place]) 04/13/2017  ? Advanced care planning/counseling discussion 01/05/2017  ? Hx of colonic polyps   ? Benign neoplasm of cecum   ? Osteopenia 07/26/2015  ? COPD, severe ([place]) 07/26/2015  ? Essential hypertension 07/26/2015  ? Hyperlipidemia 07/26/2015  ? ? ?Conditions to be addressed/monitored:HTN, HLD, COPD, and  CKD Stage 3 with anemia ? ?Care Plan : RNCM; General Plan of Care (Adult) for Chronic Disease Management and Care Coordination Needs  ?Updates made by Madison Ingles, RN since 01/03/2022 12:00 AM  ?  ? ?Problem: RNCM: Development of Plan of care for Chronic Disease Management (HTN, HLD, COPD, CKD3 with anemia)    ?Priority: High  ?  ? ?Long-Range Goal: RNCM: Effective Managment  of Plan of care for Chronic Disease Management (HTN, HLD, COPD, CKD3 with anemia)   ?Start Date: 09/03/2021  ?Expected End Date: 09/03/2022  ?Priority: High  ?Note:   ?Current Barriers:  ?Knowledge Deficits related to plan of care for management of HTN, HLD, COPD, and CKD Stage 3  ?Chronic Disease Management support and education needs related to HTN, HLD, COPD, and CKD Stage 3 ? ?RNCM Clinical Goal(s):  ?Patient will verbalize understanding of plan for management of HTN, HLD, COPD, and CKD Stage 3 as evidenced by following the plan of care, calling the office for changes and working with the CCM team to optimize health and well being  ?take all medications exactly as prescribed and will call provider for medication related questions as evidenced by taking as prescribed and calling for refills before running out of medications     ?attend all scheduled medical appointments: 03-20-2022 with the pcp  as evidenced by keeping appointments and calling the office for needed schedule changes         ?demonstrate a decrease in HTN, HLD, COPD, and CKD Stage 3 exacerbations  as evidenced by working with the CCM team and pcp to optimize health and well being ?demonstrate ongoing self health care management ability for effective management of chronic conditions  as evidenced by  working with the CCM team through collaboration with Consulting civil engineer, provider, and care team.  ? ?Interventions: ?1:1 collaboration with primary care provider regarding development and update of comprehensive plan of care as evidenced by provider attestation and co-signature ?Inter-disciplinary care team collaboration (see longitudinal plan of care) ?Evaluation of current treatment plan related to  self management and patient's adherence to plan as established by provider ? ? ?Chronic Kidney Disease (Status: Goal on Track (progressing): YES.)  Long Term Goal  ?Last practice recorded BP  readings:  ?BP Readings from Last 3 Encounters:  ?09/19/21 137/70  ?07/23/21 (!) 157/80  ?07/15/21 (!) 176/74  ?Most recent eGFR/CrCl:  ?Lab Results  ?Component Value Date  ? EGFR 34 (L) 09/19/2021  ?  No components found for: CRCL ? ?Assessed the patients spouse and DRP  understanding of chronic kidney disease. 01-03-2022: The patients spouse has a good understanding of CKD. The patient is compliant with labs and follow up with provider. The patient and spouse deny any new issues or concerns with CKD.    ?Evaluation of current treatment plan related to chronic kidney disease self management and patient's adherence to plan as established by provider     ?Provided education to patient re: stroke prevention, s/s of heart attack and stroke    ?Reviewed prescribed diet heart healthy/Renal  ?Reviewed medications with patient and discussed importance of compliance. 01-03-2022: States compliance with medications.  The patients husband assist with medications management     ?Advised patient, providing education and rationale, to monitor blood pressure daily and record, calling PCP for findings outside established parameters    ?Discussed complications of poorly controlled blood pressure such as heart disease, stroke, circulatory complications, vision complications, kidney impairment, sexual dysfunction    ?Discussed plans  with patient for ongoing care management follow up and provided patient with direct contact information for care management team    ?Screening for signs and symptoms of depression related to chronic disease state      ?Discussed the impact of chronic kidney disease on daily life and mental health and acknowledged and normalized feelings of disempowerment, fear, and frustration    ?Assessed social determinant of health barriers    ?Provided education on kidney disease progression    ?Engage patient in early, proactive and ongoing discussion about goals of care and what matters most to them    ?Support coping  and stress management by recognizing current strategies and assist in developing new strategies such as mindfulness, journaling, relaxation techniques, problem-solving    ? ? ?COPD: (Status: Goal on Track (prog

## 2022-01-03 NOTE — Patient Instructions (Signed)
Visit Information ? ?Thank you for taking time to visit with me today. Please don't hesitate to contact me if I can be of assistance to you before our next scheduled telephone appointment. ? ?Following are the goals we discussed today:  ?RNCM Clinical Goal(s):  ?Patient will verbalize understanding of plan for management of HTN, HLD, COPD, and CKD Stage 3 as evidenced by following the plan of care, calling the office for changes and working with the CCM team to optimize health and well being  ?take all medications exactly as prescribed and will call provider for medication related questions as evidenced by taking as prescribed and calling for refills before running out of medications     ?attend all scheduled medical appointments: 03-20-2022 with the pcp  as evidenced by keeping appointments and calling the office for needed schedule changes         ?demonstrate a decrease in HTN, HLD, COPD, and CKD Stage 3 exacerbations  as evidenced by working with the CCM team and pcp to optimize health and well being ?demonstrate ongoing self health care management ability for effective management of chronic conditions  as evidenced by  working with the CCM team through collaboration with Consulting civil engineer, provider, and care team.  ?  ?Interventions: ?1:1 collaboration with primary care provider regarding development and update of comprehensive plan of care as evidenced by provider attestation and co-signature ?Inter-disciplinary care team collaboration (see longitudinal plan of care) ?Evaluation of current treatment plan related to  self management and patient's adherence to plan as established by provider ?  ?  ?Chronic Kidney Disease (Status: Goal on Track (progressing): YES.)  Long Term Goal  ?Last practice recorded BP readings:  ?   ?BP Readings from Last 3 Encounters:  ?09/19/21 137/70  ?07/23/21 (!) 157/80  ?07/15/21 (!) 176/74  ?Most recent eGFR/CrCl:  ?     ?Lab Results  ?Component Value Date  ?  EGFR 34 (L) 09/19/2021  ?   No components found for: CRCL ?  ?Assessed the patients spouse and DRP  understanding of chronic kidney disease. 01-03-2022: The patients spouse has a good understanding of CKD. The patient is compliant with labs and follow up with provider. The patient and spouse deny any new issues or concerns with CKD.    ?Evaluation of current treatment plan related to chronic kidney disease self management and patient's adherence to plan as established by provider     ?Provided education to patient re: stroke prevention, s/s of heart attack and stroke    ?Reviewed prescribed diet heart healthy/Renal  ?Reviewed medications with patient and discussed importance of compliance. 01-03-2022: States compliance with medications.  The patients husband assist with medications management     ?Advised patient, providing education and rationale, to monitor blood pressure daily and record, calling PCP for findings outside established parameters    ?Discussed complications of poorly controlled blood pressure such as heart disease, stroke, circulatory complications, vision complications, kidney impairment, sexual dysfunction    ?Discussed plans with patient for ongoing care management follow up and provided patient with direct contact information for care management team    ?Screening for signs and symptoms of depression related to chronic disease state      ?Discussed the impact of chronic kidney disease on daily life and mental health and acknowledged and normalized feelings of disempowerment, fear, and frustration    ?Assessed social determinant of health barriers    ?Provided education on kidney disease progression    ?Engage patient  in early, proactive and ongoing discussion about goals of care and what matters most to them    ?Support coping and stress management by recognizing current strategies and assist in developing new strategies such as mindfulness, journaling, relaxation techniques, problem-solving    ?  ?  ?COPD: (Status: Goal on  Track (progressing): YES.) Long Term Goal  ?Reviewed medications with patient, including use of prescribed maintenance and rescue inhalers, and provided. 11-05-2021: Review with the patient and patients husband about appointment with pharm D on 11-11-2021 at 2 pm. The inhaler the patient takes is cost prohibitive. Education on being available to discuss options with the pharm D. 01-03-2022: The patients husband states the patient is doing very well and has no new changes in her breathing or any new exacerbations since last outreach with COPD. The patients husband feels the patient is doing very well.  ?instruction on medication management and the importance of adherence ?Provided patient with basic written and verbal COPD education on self care/management/and exacerbation prevention ?Advised patient to track and manage COPD triggers. 01-03-2022: The patient and patients husband know the triggers to her COPD. Denies any acute exacerbations or findings at this time.  ?Provided written and verbal instructions on pursed lip breathing and utilized returned demonstration as teach back ?Provided instruction about proper use of medications used for management of COPD including inhalers ?Advised patient to self assesses COPD action plan zone and make appointment with provider if in the yellow zone for 48 hours without improvement ?Advised patient to engage in light exercise as tolerated 3-5 days a week to aid in the the management of COPD ?Provided education about and advised patient to utilize infection prevention strategies to reduce risk of respiratory infection. 01-03-2022: Review of safety measures and remaining safe in high risk season of infections with respiratory conditions presently. The patient does not go out a lot and practices safety measures to prevent infection.  ?Discussed the importance of adequate rest and management of fatigue with COPD. 11-05-2021: The patients husband states that sometimes he thinks the  patient sleeps too much. 01-03-2022: The patient is resting well. No new changes in her condition ?Screening for signs and symptoms of depression related to chronic disease state  ?Assessed social determinant of health barriers ?  ?Hyperlipidemia:  (Status: Goal on Track (progressing): YES.) Long Term Goal  ?     ?Lab Results  ?Component Value Date  ?  CHOL 187 09/19/2021  ?  HDL 55 09/19/2021  ?  LDLCALC 107 (H) 09/19/2021  ?  TRIG 142 09/19/2021  ?  ?  ?Medication review performed; medication list updated in electronic medical record. 01-03-2022: Is compliant with Lipitor 20 mg daily. Denies any issues with compliance.  ?Provider established cholesterol goals reviewed. 01-03-2022: Review of monitoring for foods high in cholesterol and eating a heart healthy diet; ?Counseled on importance of regular laboratory monitoring as prescribed. 01-03-2022: The patient has regular lab work for monitoring of cholesterol levels; ?Provided HLD educational materials; ?Reviewed role and benefits of statin for ASCVD risk reduction; ?Discussed strategies to manage statin-induced myalgias; ?Reviewed importance of limiting foods high in cholesterol; ?  ?Hypertension: (Status: Goal on Track (progressing): YES.) ?Last practice recorded BP readings:  ?   ?BP Readings from Last 3 Encounters:  ?09/19/21 137/70  ?07/23/21 (!) 157/80  ?07/15/21 (!) 176/74  ?Most recent eGFR/CrCl:  ?     ?Lab Results  ?Component Value Date  ?  EGFR 34 (L) 09/19/2021  ?  No components found for:  CRCL ?  ?Evaluation of current treatment plan related to hypertension self management and patient's adherence to plan as established by provider. 01-03-2022: Blood pressure stable at last appointment. The patient and patients husband denies any issues or concerns with blood pressures, HTN, or heart health;   ?Provided education to patient re: stroke prevention, s/s of heart attack and stroke; ?Reviewed prescribed diet heart healthy/Renal. 01-03-2022: The patient is  complaint with dietary restrictions. The patient is eating well.   ?Reviewed medications with patient and discussed importance of compliance. 01-03-2022: The patient is compliant with medications;  ?Discussed plan

## 2022-01-03 NOTE — Telephone Encounter (Signed)
?  Care Management  ? ?Follow Up Note ? ? ?01/03/2022 ?Name: Madison Franklin MRN: [id number] DOB: [birth year] ? ? ?Referred by: Venita Lick, NP ?Reason for referral : Chronic Care Management (RNCM: Follow up for Chronic Disease Management and Care Coordination Needs) ? ? ?Call made back to the patient and spoke with the patients husband and DRP Donald. See new encounter for details.  ? ?Follow Up Plan: Telephone follow up appointment with care management team member scheduled for: 03-07-2022 at 1 pm ? ?Noreene Larsson RN, MSN, CCM ?Community Care Coordinator ?[facility] ?[place] ?Mobile: [phone]  ?

## 2022-01-08 DIAGNOSIS — J449 Chronic obstructive pulmonary disease, unspecified: Secondary | ICD-10-CM | POA: Diagnosis not present

## 2022-01-14 ENCOUNTER — Other Ambulatory Visit: Payer: Self-pay | Admitting: Nurse Practitioner

## 2022-01-15 NOTE — Telephone Encounter (Signed)
Requested Prescriptions  ?Pending Prescriptions Disp Refills  ?? albuterol (VENTOLIN HFA) 108 (90 Base) MCG/ACT inhaler [Pharmacy Med Name: ALBUTEROL HFA 90MCG/ACT (PA)] 34 g 3  ?  Sig: USE 2 INHALATIONS BY MOUTH EVERY 6 HOURS AS NEEDED FOR WHEEZING  OR SHORTNESS OF BREATH  ?  ? Pulmonology:  Beta Agonists 2 Passed - 01/14/2022 12:01 PM  ?  ?  Passed - Last BP in normal range  ?  BP Readings from Last 1 Encounters:  ?09/19/21 137/70  ?   ?  ?  Passed - Last Heart Rate in normal range  ?  Pulse Readings from Last 1 Encounters:  ?09/19/21 78  ?   ?  ?  Passed - Valid encounter within last 12 months  ?  Recent Outpatient Visits   ?      ? 3 months ago Severe pulmonary hypertension ([place])  ? [place], Kevil T, NP  ? 10 months ago Pulmonary arterial hypertension ([place])  ? [place], Henrine Screws T, NP  ? 1 year ago COPD, severe ([place])  ? [place], Henrine Screws T, NP  ? 1 year ago COPD, severe ([employer])  ? [place], Pindall T, NP  ? 2 years ago COPD, severe ([place])  ? [facility], Henrine Screws T, NP  ?  ?  ?Future Appointments   ?        ? In 2 months Cannady, Barbaraann Faster, NP [employer]  ?  ? ?  ?  ?  ? ? ?

## 2022-01-17 DIAGNOSIS — I129 Hypertensive chronic kidney disease with stage 1 through stage 4 chronic kidney disease, or unspecified chronic kidney disease: Secondary | ICD-10-CM | POA: Diagnosis not present

## 2022-01-17 DIAGNOSIS — E785 Hyperlipidemia, unspecified: Secondary | ICD-10-CM | POA: Diagnosis not present

## 2022-01-17 DIAGNOSIS — J449 Chronic obstructive pulmonary disease, unspecified: Secondary | ICD-10-CM | POA: Diagnosis not present

## 2022-01-17 DIAGNOSIS — N183 Chronic kidney disease, stage 3 unspecified: Secondary | ICD-10-CM

## 2022-01-29 ENCOUNTER — Ambulatory Visit: Payer: Medicare Other | Admitting: Oncology

## 2022-01-29 ENCOUNTER — Inpatient Hospital Stay: Payer: Medicare Other | Admitting: Oncology

## 2022-01-29 ENCOUNTER — Other Ambulatory Visit: Payer: Medicare Other

## 2022-02-03 DIAGNOSIS — J449 Chronic obstructive pulmonary disease, unspecified: Secondary | ICD-10-CM | POA: Diagnosis not present

## 2022-02-06 ENCOUNTER — Other Ambulatory Visit: Payer: Self-pay | Admitting: Nurse Practitioner

## 2022-02-07 ENCOUNTER — Other Ambulatory Visit: Payer: Self-pay | Admitting: Nurse Practitioner

## 2022-02-08 DIAGNOSIS — J449 Chronic obstructive pulmonary disease, unspecified: Secondary | ICD-10-CM | POA: Diagnosis not present

## 2022-02-10 ENCOUNTER — Telehealth: Payer: Medicare Other

## 2022-02-10 ENCOUNTER — Telehealth: Payer: Self-pay

## 2022-02-10 NOTE — Progress Notes (Incomplete)
? ?Chronic Care Management ?Pharmacy Note ? ?02/10/2022 ?Name:  Madison Franklin MRN:  [id number] DOB:  02/26/1946 ? ?Summary: ?COPD - should qualify for [place] PAP based off of initial review, will send application ?Reports to have scheduled Neph f/u w/ Dr Holley Raring at [facility], specific date not provided  ? ?Subjective: ?Madison Franklin is an 76 y.o. year old female who is a primary patient of Cannady, Barbaraann Faster, NP.  The CCM team was consulted for assistance with disease management and care coordination needs.   ? ?Engaged with patient by telephone for follow up visit in response to provider referral for pharmacy case management and/or care coordination services.  ? ?Consent to Services:  ?The patient was given information about Chronic Care Management services, agreed to services, and gave verbal consent prior to initiation of services.  Please see initial visit note for detailed documentation.  ? ?Patient Care Team: ?Venita Lick, NP as PCP - General (Nurse Practitioner) ?[name], MD as Consulting Physician (Gastroenterology) ?Anthonette Legato, MD (Internal Medicine) ?Jules Husbands, MD as Consulting Physician (General Surgery) ?Vanita Ingles, RN as Case Manager (General Practice) ?Madelin Rear, [facility] (Pharmacist) ? ?Objective: ? ?Lab Results  ?Component Value Date  ? CREATININE 1.56 (H) 09/19/2021  ? CREATININE 1.35 (H) 03/20/2021  ? CREATININE 1.43 (H) 11/29/2020  ? ? ?Lab Results  ?Component Value Date  ? HGBA1C 4.8 09/19/2021  ? HGBA1C 5.2 03/20/2021  ? ?Last diabetic Eye exam: No results found for: HMDIABEYEEXA  ?Last diabetic Foot exam: No results found for: HMDIABFOOTEX  ? ?   ?Component Value Date/Time  ? CHOL 187 09/19/2021 1016  ? CHOL 192 04/20/2020 1419  ? CHOL 181 11/24/2017 1033  ? TRIG 142 09/19/2021 1016  ? TRIG 205 (H) 04/20/2020 1419  ? TRIG 142 11/24/2017 1033  ? HDL 55 09/19/2021 1016  ? HDL 52 04/20/2020 1419  ? HDL 51 11/24/2017 1033  ? LDLCALC 107 (H) 09/19/2021 1016  ? LDLCALC 105  (H) 04/20/2020 1419  ? LDLCALC 102 (H) 11/24/2017 1033  ? LDLCALC 108 (H) 04/13/2017 1202  ? [place] 92 09/29/2016 1050  ? ? ? ?  Latest Ref Rng & Units 03/20/2021  ?  3:29 PM 08/03/2018  ? 10:06 AM 07/25/2018  ? 11:14 AM  ?Hepatic Function  ?Total Protein 6.0 - 8.5 g/dL 7.3   7.9   7.6    ?Albumin 3.7 - 4.7 g/dL 4.6   4.2   4.3    ?AST 0 - 40 IU/L '15   16   17    ' ?ALT 0 - 32 IU/L '7   8   8    ' ?Alk Phosphatase 44 - 121 IU/L 101   65   69    ?Total Bilirubin 0.0 - 1.2 mg/dL 0.4   0.4   0.6    ? ? ?Lab Results  ?Component Value Date/Time  ? TSH 0.623 03/20/2021 03:29 PM  ? TSH 0.999 04/20/2020 02:19 PM  ? ? ? ?  Latest Ref Rng & Units 10/03/2021  ? 10:47 AM 05/29/2021  ?  9:46 AM 03/20/2021  ? 12:50 PM  ?CBC  ?WBC 4.0 - 10.5 K/uL 8.0   8.7   8.9    ?Hemoglobin 12.0 - 15.0 g/dL 11.2   10.8   10.7    ?Hematocrit 36.0 - 46.0 % 36.6   35.5   36.1    ?Platelets 150 - 400 K/uL 157   174   172    ? ? ?  Lab Results  ?Component Value Date/Time  ? VD25OH 45.9 09/19/2021 10:16 AM  ? VD25OH 23.2 (L) 11/24/2017 10:33 AM  ? ? ?Clinical ASCVD:  ?The ASCVD Risk score (Arnett DK, et al., 2019) failed to calculate for the following reasons: ?  The systolic blood pressure is missing   ?Social History  ? ?Tobacco Use  ?Smoking Status Former  ? Packs/day: 0.50  ? Years: 30.00  ? Pack years: 15.00  ? Types: Cigarettes  ? Quit date: 01/17/2017  ? Years since quitting: 5.0  ?Smokeless Tobacco Never  ? ?BP Readings from Last 3 Encounters:  ?09/19/21 137/70  ?07/23/21 (!) 157/80  ?07/15/21 (!) 176/74  ? ?Pulse Readings from Last 3 Encounters:  ?09/19/21 78  ?07/23/21 83  ?07/15/21 88  ? ?Wt Readings from Last 3 Encounters:  ?09/19/21 164 lb 9.6 oz (74.7 kg)  ?07/23/21 165 lb (74.8 kg)  ?07/15/21 162 lb 3.2 oz (73.6 kg)  ? ?BMI Readings from Last 3 Encounters:  ?09/19/21 31.10 kg/m?  ?07/23/21 31.18 kg/m?  ?07/15/21 30.65 kg/m?  ? ? ?Assessment: Review of patient past medical history, allergies, medications, health status, including review of  consultants reports, laboratory and other test data, was performed as part of comprehensive evaluation and provision of chronic care management services.  ? ?SDOH:  (Social Determinants of Health) assessments and interventions performed: Yes ? ? ? ? ?[place] ? ?Allergies  ?Allergen Reactions  ? Latex Itching and Rash  ?  With latex gloves (when worn)  ? ? ?Medications Reviewed Today   ? ? Reviewed by Vanita Ingles, RN (Case Manager) on 01/03/22 at 1058  Med List Status: <None>  ? ?Medication Order Taking? Sig Documenting Provider Last Dose Status Informant  ?acetaminophen (TYLENOL) 325 MG tablet [id number] No Take 325 mg by mouth every 6 (six) hours as needed (for pain.). [provider] Taking Active Self  ?albuterol (VENTOLIN HFA) 108 (90 Base) MCG/ACT inhaler [id number] No Inhale 2 puffs into the lungs every 6 (six) hours as needed for wheezing or shortness of breath. Tyler Pita, MD Taking Active   ?amLODipine (NORVASC) 5 MG tablet [id number]  TAKE 1 TABLET BY MOUTH  DAILY Cannady, Jolene T, NP  Active   ?aspirin EC 81 MG tablet [id number] No Take 81 mg by mouth daily. [provider] Taking Active Self  ?atorvastatin (LIPITOR) 20 MG tablet [id number]  TAKE 1 TABLET BY MOUTH  DAILY Cannady, Jolene T, NP  Active   ?hydrochlorothiazide (MICROZIDE) 12.5 MG capsule [id number] No Take 1 capsule (12.5 mg total) by mouth daily. Marnee Guarneri T, NP Taking Active   ?lisinopril (ZESTRIL) 40 MG tablet [id number]  TAKE 1 TABLET BY MOUTH  DAILY Cannady, Jolene T, NP  Active   ?Tiotropium Bromide Monohydrate (SPIRIVA RESPIMAT) 2.5 MCG/ACT AERS [id number] No Inhale 2 puffs into the lungs daily. Martyn Ehrich, NP Taking Active   ?Tiotropium Bromide-Olodaterol (STIOLTO RESPIMAT) 2.5-2.5 MCG/ACT AERS [id number] No Inhale 2.5 mcg into the lungs 2 (two) times daily. Tyler Pita, MD Taking Active   ?Tiotropium Bromide-Olodaterol 2.5-2.5 MCG/ACT AERS [id number] No Inhale 2 puffs into the lungs  daily. Marnee Guarneri T, NP Taking Active   ?vitamin B-12 (CYANOCOBALAMIN) 1000 MCG tablet [id number] No Take 1,000 mcg by mouth daily. [provider] Taking Active   ?Vitamin D, Cholecalciferol, 1000 units TABS [id number] No Take 1 tablet by mouth daily. [provider] Taking Active Self  ? ?  ?  ? ?  ? ? ?Patient  Active Problem List  ? Diagnosis Date Noted  ? Mold exposure 03/20/2021  ? OSA (obstructive sleep apnea) 03/20/2021  ? IFG (impaired fasting glucose) 03/20/2021  ? Aortic atherosclerosis ([place]) 07/29/2020  ? Chronic respiratory failure with hypoxia ([place]) 07/29/2020  ? severe restrictive pulmonary physiology due to severe kyphoscoliosis 07/04/2020  ? Porcelain gallbladder 11/30/2019  ? Severe pulmonary hypertension ([place]) 10/02/2019  ? Intestinal metaplasia of gastric mucosa   ? Chronic atrophic gastritis without bleeding 12/15/2018  ? B12 deficiency 08/04/2018  ? Nodule of upper lobe of left lung 05/26/2018  ? Anemia in chronic kidney disease 05/26/2018  ? Coronary artery calcification 06/18/2017  ? Heart murmur 04/13/2017  ? Chronic kidney disease, stage 3 ([place]) 04/13/2017  ? Advanced care planning/counseling discussion 01/05/2017  ? Hx of colonic polyps   ? Benign neoplasm of cecum   ? Osteopenia 07/26/2015  ? COPD, severe ([place]) 07/26/2015  ? Essential hypertension 07/26/2015  ? Hyperlipidemia 07/26/2015  ? ? ?Immunization History  ?Administered Date(s) Administered  ? Fluad Quad(high Dose 65+) 08/03/2020, 09/19/2021  ? Influenza, High Dose Seasonal PF 09/03/2016  ? Influenza,inj,Quad PF,6+ Mos 07/27/2015  ? Influenza,inj,quad, With Preservative 08/24/2018, 07/15/2019  ? Influenza-Unspecified 08/14/2017, 08/24/2018  ? Moderna Sars-Covid-2 Vaccination 10/18/2020  ? PFIZER(Purple Top)SARS-COV-2 Vaccination 12/15/2019, 01/11/2020  ? Pneumococcal Conjugate-13 01/12/2015  ? Pneumococcal Polysaccharide-23 07/01/2011  ? Td 07/27/2015  ? ? ?Conditions to be addressed/monitored: ?HLD HTN DMII  COPD OSA CKD ? ?There are no care plans that you recently modified to display for this patient. ? ? ? ?Future Appointments  ?Date Time Provider [place]  ?02/10/2022  3:00 PM [facility] PHARMACY [facility]  ?5

## 2022-02-10 NOTE — Telephone Encounter (Signed)
?  Chronic Care Management  ? ?Outreach Note ? ?02/10/2022 ?Name: Madison Franklin MRN: [id number] DOB: 1946/02/04 ? ?Referred by: Venita Lick, NP ?Reason for referral: telephone visit [place] clinical pharmacist, Madelin Rear.  ? ? ?An unsuccessful telephone outreach was attempted today. The patient was referred to the pharmacist for assistance with care management and care coordination.  ? ?If patient returns call, please provide number below. No answer to f/u telephone visit, lvm.   ? ?Madelin Rear, Pharm.D., BCGP ?Clinical Pharmacist ?[place] ?[phone] ? ?

## 2022-02-21 ENCOUNTER — Inpatient Hospital Stay: Payer: Medicare Other | Admitting: Medical Oncology

## 2022-02-21 ENCOUNTER — Inpatient Hospital Stay: Payer: Medicare Other | Attending: Internal Medicine

## 2022-02-21 ENCOUNTER — Other Ambulatory Visit: Payer: Self-pay

## 2022-02-21 VITALS — BP 145/65 | HR 80 | Temp 98.3°F | Resp 17 | Wt 160.0 lb

## 2022-02-21 DIAGNOSIS — D509 Iron deficiency anemia, unspecified: Secondary | ICD-10-CM | POA: Insufficient documentation

## 2022-02-21 DIAGNOSIS — Z90710 Acquired absence of both cervix and uterus: Secondary | ICD-10-CM | POA: Diagnosis not present

## 2022-02-21 DIAGNOSIS — N183 Chronic kidney disease, stage 3 unspecified: Secondary | ICD-10-CM | POA: Insufficient documentation

## 2022-02-21 DIAGNOSIS — D631 Anemia in chronic kidney disease: Secondary | ICD-10-CM | POA: Insufficient documentation

## 2022-02-21 DIAGNOSIS — Z87891 Personal history of nicotine dependence: Secondary | ICD-10-CM | POA: Insufficient documentation

## 2022-02-21 DIAGNOSIS — I129 Hypertensive chronic kidney disease with stage 1 through stage 4 chronic kidney disease, or unspecified chronic kidney disease: Secondary | ICD-10-CM | POA: Insufficient documentation

## 2022-02-21 LAB — BASIC METABOLIC PANEL WITH GFR
Anion gap: 10 (ref 5–15)
BUN: 37 mg/dL — ABNORMAL HIGH (ref 8–23)
CO2: 22 mmol/L (ref 22–32)
Calcium: 9.5 mg/dL (ref 8.9–10.3)
Chloride: 107 mmol/L (ref 98–111)
Creatinine, Ser: 1.55 mg/dL — ABNORMAL HIGH (ref 0.44–1.00)
GFR, Estimated: 34 mL/min — ABNORMAL LOW
Glucose, Bld: 102 mg/dL — ABNORMAL HIGH (ref 70–99)
Potassium: 4.4 mmol/L (ref 3.5–5.1)
Sodium: 139 mmol/L (ref 135–145)

## 2022-02-21 LAB — CBC
HCT: 39.6 % (ref 36.0–46.0)
Hemoglobin: 12 g/dL (ref 12.0–15.0)
MCH: 25.5 pg — ABNORMAL LOW (ref 26.0–34.0)
MCHC: 30.3 g/dL (ref 30.0–36.0)
MCV: 84.3 fL (ref 80.0–100.0)
Platelets: 166 10*3/uL (ref 150–400)
RBC: 4.7 MIL/uL (ref 3.87–5.11)
RDW: 14.4 % (ref 11.5–15.5)
WBC: 8.2 10*3/uL (ref 4.0–10.5)
nRBC: 0 % (ref 0.0–0.2)

## 2022-02-21 LAB — IRON AND TIBC
Iron: 48 ug/dL (ref 28–170)
Saturation Ratios: 14 % (ref 10.4–31.8)
TIBC: 346 ug/dL (ref 250–450)
UIBC: 298 ug/dL

## 2022-02-21 LAB — FERRITIN: Ferritin: 37 ng/mL (ref 11–307)

## 2022-02-25 ENCOUNTER — Other Ambulatory Visit: Payer: Self-pay | Admitting: Nurse Practitioner

## 2022-02-26 NOTE — Telephone Encounter (Signed)
Requested Prescriptions  ?Pending Prescriptions Disp Refills  ?? albuterol (VENTOLIN HFA) 108 (90 Base) MCG/ACT inhaler [Pharmacy Med Name: ALBUTEROL HFA 90MCG/ACT (PA)] 25.5 g 3  ?  Sig: USE 2 INHALATIONS BY MOUTH EVERY 6 HOURS AS NEEDED FOR WHEEZING  OR SHORTNESS OF BREATH  ?  ? Pulmonology:  Beta Agonists 2 Failed - 02/25/2022 10:16 PM  ?  ?  Failed - Last BP in normal range  ?  BP Readings from Last 1 Encounters:  ?02/21/22 (!) 145/65  ?   ?  ?  Passed - Last Heart Rate in normal range  ?  Pulse Readings from Last 1 Encounters:  ?02/21/22 80  ?   ?  ?  Passed - Valid encounter within last 12 months  ?  Recent Outpatient Visits   ?      ? 5 months ago Severe pulmonary hypertension ([place])  ? [facility] [place], [place] T, NP  ? 11 months ago Pulmonary arterial hypertension ([place])  ? [place], Henrine Screws T, NP  ? 1 year ago COPD, severe ([place])  ? [place], Henrine Screws T, NP  ? 1 year ago COPD, severe ([place])  ? [place], [place] T, NP  ? 2 years ago COPD, severe ([place])  ? [facility] [place], Henrine Screws T, NP  ?  ?  ?Future Appointments   ?        ? In 3 weeks Cannady, Barbaraann Faster, NP [employer]  ?  ? ?  ?  ?  ? ? ?

## 2022-02-28 ENCOUNTER — Inpatient Hospital Stay (HOSPITAL_BASED_OUTPATIENT_CLINIC_OR_DEPARTMENT_OTHER): Payer: Medicare Other | Admitting: Medical Oncology

## 2022-02-28 ENCOUNTER — Telehealth: Payer: Self-pay

## 2022-02-28 ENCOUNTER — Other Ambulatory Visit: Payer: Self-pay

## 2022-02-28 DIAGNOSIS — D509 Iron deficiency anemia, unspecified: Secondary | ICD-10-CM | POA: Diagnosis not present

## 2022-02-28 DIAGNOSIS — D631 Anemia in chronic kidney disease: Secondary | ICD-10-CM

## 2022-02-28 DIAGNOSIS — N183 Chronic kidney disease, stage 3 unspecified: Secondary | ICD-10-CM

## 2022-02-28 NOTE — Telephone Encounter (Signed)
Have placed referral to Dr. Elwyn Lade office per Nelwyn Salisbury, PA. Will fax encounter note, demo, and insurance info once encounter note is signed ?

## 2022-02-28 NOTE — Telephone Encounter (Signed)
Called patient to CHART for Video visit, no answer, LMTCB. ?

## 2022-03-02 ENCOUNTER — Other Ambulatory Visit: Payer: Self-pay | Admitting: Nurse Practitioner

## 2022-03-02 DIAGNOSIS — F17200 Nicotine dependence, unspecified, uncomplicated: Secondary | ICD-10-CM

## 2022-03-02 DIAGNOSIS — I10 Essential (primary) hypertension: Secondary | ICD-10-CM

## 2022-03-03 ENCOUNTER — Encounter: Payer: Self-pay | Admitting: Hematology and Oncology

## 2022-03-03 NOTE — Progress Notes (Signed)
? ?Virtual Visit Progress Note ? ?Ms. Clos,you are scheduled for a virtual visit with your provider today.   ? ?Just as we do with appointments in the office, we must obtain your consent to participate.  Your consent will be active for this visit and any virtual visit you may have with one of our providers in the next 365 days.   ? ?If you have a MyChart account, I can also send a copy of this consent to you electronically.  All virtual visits are billed to your insurance company just like a traditional visit in the office.  As this is a virtual visit, video technology does not allow for your provider to perform a traditional examination.  This may limit your provider's ability to fully assess your condition.  If your provider identifies any concerns that need to be evaluated in person or the need to arrange testing such as labs, EKG, etc, we will make arrangements to do so.   ? ?Although advances in technology are sophisticated, we cannot ensure that it will always work on either your end or our end.  If the connection with a video visit is poor, we may have to switch to a telephone visit.  With either a video or telephone visit, we are not always able to ensure that we have a secure connection.   I need to obtain your verbal consent now.   Are you willing to proceed with your visit today?  ? ?Madison Franklin has provided verbal consent on 03/03/2022 for a virtual visit (video or telephone). ? ? ?Madison Closs, PA-C ?03/03/2022  11:02 AM ?  ? ?I connected with Madison Franklin on 03/03/22 at  2:00 PM EDT by telephone visit and verified that I am speaking with the correct person using two identifiers.  ? ?I discussed the limitations, risks, security and privacy concerns of performing an evaluation and management service by telemedicine and the availability of in-person appointments. I also discussed with the patient that there may be a patient responsible charge related to this service. The patient expressed  understanding and agreed to proceed.  ? ?Other persons participating in the visit and their role in the encounter: Patient's husband ? ?Patient?s location: Home  ?Provider?s location: Home ? ?Chief Complaint: Anemia follow up ?   ?Patient Care Team: ?Venita Lick, NP as PCP - General (Nurse Practitioner) ?[name], MD as Consulting Physician (Gastroenterology) ?Anthonette Legato, MD (Internal Medicine) ?Jules Husbands, MD as Consulting Physician (General Surgery) ?Vanita Ingles, RN as Case Manager (General Practice) ?Madelin Rear, [facility] (Pharmacist)  ? ?Name of the patient: Madison Franklin  ?[id number]  ?September 26, 1946  ? ?Date of visit: 03/03/22 ? ?Heme/Onc history: patient is a 76 year old female who was seen previously by Dr. Mike Gip for iron deficiency anemia and anemia of chronic kidney disease.  She has received IV iron in the past.  Patient also has history of stage III CKD. ?  ?She has a history of colonic polyps.   Colonoscopy on 11/14/2016 revealed a sub-optimal prep. Findings included a 10 mm polyp in the cecum, seven 4-10 mm sessile polyps in the transverse colon, a 6 mm polyp in the descending colon, three 5-6 mm sessile polyps in the sigmoid colon,.  Pathology revealed 7 tubular adenomas, 2 hyperplastic polyps, and 2 inflammatory polyps.  Colonoscopy on 11/10/2018 revealed three 5 to 6 mm polyps in the ascending colon (2 tubular adenomas; sessile serrated adenoma), one 8 mm polyp in the transverse colon (  tubular adenoma), one 5 mm polyp in the transverse colon (tubular adenoma), one 5 mm polyp in the sigmoid colon (tubular adenoma), one 7 mm polyp at the recto-sigmoid colon (hyperplastic polyp), and non-bleeding external and internal hemorrhoids.  She will have a follow-up colonoscopy in 3 years.  Patient has not required any EPO so far ?  ?EGD on 11/10/2018 revealed non-bleeding erosive gastropathy. Pathology revealed chronic inactive atrophic gastritis with intestinal metaplasia.  There was a single  non-bleeding erosion in the upper third of the esophagus.  EGD on 05/11/2019 revealed normal duodenal bulb and second portion of the duodenum. There was non-bleeding erosive gastropathy and gastric mucosal atrophy.  There was a normal gastroesophageal junction and esophagus. Pathology revealed chronic inactive atrophic gastritis with intestinal metaplasia, negative for H pylori.  ? ?Interval History: Patient has a visit today to review recent lab work and discuss plan regarding her anemia. She has had some mild increase in fatigue but otherwise feels well. No bleeding or bruising episodes. No chest pain, SOB.  ? ?Review of systems- ROS  ? ?Clinical Support on 02/21/2022  ?Component Date Value Ref Range Status  ? Iron 02/21/2022 48  28 - 170 ug/dL Final  ? HEMOLYSIS AT THIS LEVEL MAY AFFECT RESULT  ? TIBC 02/21/2022 346  250 - 450 ug/dL Final  ? Saturation Ratios 02/21/2022 14  10.4 - 31.8 % Final  ? UIBC 02/21/2022 298  ug/dL Final  ? Performed at [facility], [address] [place], [place] [address]  ? Sodium 02/21/2022 139  135 - 145 mmol/L Final  ? Potassium 02/21/2022 4.4  3.5 - 5.1 mmol/L Final  ? Chloride 02/21/2022 107  98 - 111 mmol/L Final  ? CO2 02/21/2022 22  22 - 32 mmol/L Final  ? Glucose, Bld 02/21/2022 102 (H)  70 - 99 mg/dL Final  ? Glucose reference range applies only to samples taken after fasting for at least 8 hours.  ? BUN 02/21/2022 37 (H)  8 - 23 mg/dL Final  ? Creatinine, Ser 02/21/2022 1.55 (H)  0.44 - 1.00 mg/dL Final  ? Calcium 02/21/2022 9.5  8.9 - 10.3 mg/dL Final  ? GFR, Estimated 02/21/2022 34 (L)  >60 mL/min Final  ? Comment: (NOTE) ?Calculated using the CKD-EPI Creatinine Equation (2021) ?  ? Anion gap 02/21/2022 10  5 - 15 Final  ? Performed at [facility], [address] [place], [place] [address]  ? Ferritin 02/21/2022 37  11 - 307 ng/mL Final  ? Performed at [facility], [address] [place], [place] [address]  ? WBC 02/21/2022 8.2  4.0 -  10.5 K/uL Final  ? RBC 02/21/2022 4.70  3.87 - 5.11 MIL/uL Final  ? Hemoglobin 02/21/2022 12.0  12.0 - 15.0 g/dL Final  ? HCT 02/21/2022 39.6  36.0 - 46.0 % Final  ? MCV 02/21/2022 84.3  80.0 - 100.0 fL Final  ? MCH 02/21/2022 25.5 (L)  26.0 - 34.0 pg Final  ? MCHC 02/21/2022 30.3  30.0 - 36.0 g/dL Final  ? RDW 02/21/2022 14.4  11.5 - 15.5 % Final  ? Platelets 02/21/2022 166  150 - 400 K/uL Final  ? nRBC 02/21/2022 0.0  0.0 - 0.2 % Final  ? Performed at [facility], [address] [place], [place] [address]  ? ? ? ?Allergies  ?Allergen Reactions  ? Latex Itching and Rash  ?  With latex gloves (when worn)  ? ? ?Past Medical History:  ?Diagnosis Date  ?  COPD (chronic obstructive pulmonary disease) ([place])   ? Dyspnea   ? Hearing aid worn   ? bilateral  ? Hyperlipidemia   ? Hypertension   ? Osteoporosis   ? Personal history of tobacco use, presenting hazards to health 08/15/2015  ? Pulmonary hypertension, mild ([place])   ? mild to moderate  ? ? ?Past Surgical History:  ?Procedure Laterality Date  ? ABDOMINAL HYSTERECTOMY    ? COLONOSCOPY WITH PROPOFOL N/A 11/14/2016  ? Procedure: COLONOSCOPY WITH PROPOFOL;  Surgeon: [name], MD;  Location: [place];  Service: Endoscopy;  Laterality: N/A;  ? COLONOSCOPY WITH PROPOFOL N/A 11/10/2018  ? Procedure: COLONOSCOPY WITH PROPOFOL;  Surgeon: Lin Landsman, MD;  Location: [place];  Service: Endoscopy;  Laterality: N/A;  ? ESOPHAGOGASTRODUODENOSCOPY (EGD) WITH PROPOFOL  11/10/2018  ? Procedure: ESOPHAGOGASTRODUODENOSCOPY (EGD) WITH PROPOFOL;  Surgeon: Lin Landsman, MD;  Location: [place];  Service: Endoscopy;;  ? ESOPHAGOGASTRODUODENOSCOPY (EGD) WITH PROPOFOL N/A 05/11/2019  ? Procedure: ESOPHAGOGASTRODUODENOSCOPY (EGD) WITH PROPOFOL;  Surgeon: Lin Landsman, MD;  Location: [place];  Service: Endoscopy;  Laterality: N/A;  ? long nodule    ? POLYPECTOMY  11/14/2016  ? Procedure: POLYPECTOMY;  Surgeon: [name],  MD;  Location: [place];  Service: Endoscopy;;  ? POLYPECTOMY  11/10/2018  ? Procedure: POLYPECTOMY;  Surgeon: Lin Landsman, MD;  Location: [place];  Service: Endoscopy;;  ? RIGHT HEART CA

## 2022-03-04 NOTE — Telephone Encounter (Signed)
last RF for both lisinopril and amlodipine : 12/30/21 #90- should have enough until 04/01/22 ?Requested Prescriptions  ?Refused Prescriptions Disp Refills  ?? lisinopril (ZESTRIL) 40 MG tablet [Pharmacy Med Name: Lisinopril 40 MG Oral Tablet] 90 tablet 3  ?  Sig: TAKE 1 TABLET BY MOUTH DAILY  ?  ? Cardiovascular:  ACE Inhibitors Failed - 03/02/2022 10:48 PM  ?  ?  Failed - Cr in normal range and within 180 days  ?  Creatinine, Ser  ?Date Value Ref Range Status  ?02/21/2022 1.55 (H) 0.44 - 1.00 mg/dL Final  ?   ?  ?  Failed - Last BP in normal range  ?  BP Readings from Last 1 Encounters:  ?02/21/22 (!) 145/65  ?   ?  ?  Passed - K in normal range and within 180 days  ?  Potassium  ?Date Value Ref Range Status  ?02/21/2022 4.4 3.5 - 5.1 mmol/L Final  ?   ?  ?  Passed - Patient is not pregnant  ?  ?  Passed - Valid encounter within last 6 months  ?  Recent Outpatient Visits   ?      ? 5 months ago Severe pulmonary hypertension ([place])  ? [facility] [place], [place] T, NP  ? 11 months ago Pulmonary arterial hypertension ([place])  ? [place], Henrine Screws T, NP  ? 1 year ago COPD, severe ([place])  ? [place], Henrine Screws T, NP  ? 1 year ago COPD, severe ([place])  ? [place], [place] T, NP  ? 2 years ago COPD, severe ([place])  ? [facility] [place], Henrine Screws T, NP  ?  ?  ?Future Appointments   ?        ? In 2 weeks Cannady, Barbaraann Faster, NP [employer]  ?  ? ?  ?  ?  ?? amLODipine (NORVASC) 5 MG tablet [Pharmacy Med Name: amLODIPine Bes[place] 5 MG Oral Tablet] 90 tablet 3  ?  Sig: TAKE 1 TABLET BY MOUTH DAILY  ?  ? Cardiovascular: Calcium Channel Blockers 2 Failed - 03/02/2022 10:48 PM  ?  ?  Failed - Last BP in normal range  ?  BP Readings from Last 1 Encounters:  ?02/21/22 (!) 145/65  ?   ?  ?  Passed - Last Heart Rate in normal range  ?  Pulse Readings from Last 1 Encounters:  ?02/21/22 80  ?   ?  ?  Passed - Valid encounter within last 6  months  ?  Recent Outpatient Visits   ?      ? 5 months ago Severe pulmonary hypertension ([place])  ? [facility] [place], [place] T, NP  ? 11 months ago Pulmonary arterial hypertension ([place])  ? [place], Henrine Screws T, NP  ? 1 year ago COPD, severe ([place])  ? [place], Henrine Screws T, NP  ? 1 year ago COPD, severe ([place])  ? [place], [place] T, NP  ? 2 years ago COPD, severe ([place])  ? [facility] [place], Henrine Screws T, NP  ?  ?  ?Future Appointments   ?        ? In 2 weeks Cannady, Barbaraann Faster, NP [employer]  ?  ? ?  ?  ?  ? ? ?

## 2022-03-05 DIAGNOSIS — J449 Chronic obstructive pulmonary disease, unspecified: Secondary | ICD-10-CM | POA: Diagnosis not present

## 2022-03-07 ENCOUNTER — Telehealth: Payer: Self-pay

## 2022-03-07 ENCOUNTER — Telehealth: Payer: Medicare Other

## 2022-03-07 NOTE — Telephone Encounter (Signed)
  Care Management   Follow Up Note   03/07/2022 Name: Madison Franklin MRN: [id number] DOB: 1946/03/06   Referred by: Venita Lick, NP Reason for referral : Chronic Care Management (RNCM: Follow up for Chronic Disease Management and Care Coordination Needs )   An unsuccessful telephone outreach was attempted today. The patient was referred to the case management team for assistance with care management and care coordination.   Follow Up Plan: A HIPPA compliant phone message was left for the patient providing contact information and requesting a return call.   Noreene Larsson RN, MSN, [facility] Mobile: [phone]

## 2022-03-10 DIAGNOSIS — J449 Chronic obstructive pulmonary disease, unspecified: Secondary | ICD-10-CM | POA: Diagnosis not present

## 2022-03-14 ENCOUNTER — Telehealth: Payer: Medicare Other

## 2022-03-14 ENCOUNTER — Ambulatory Visit (INDEPENDENT_AMBULATORY_CARE_PROVIDER_SITE_OTHER): Payer: Medicare Other

## 2022-03-14 DIAGNOSIS — J449 Chronic obstructive pulmonary disease, unspecified: Secondary | ICD-10-CM

## 2022-03-14 DIAGNOSIS — N1831 Chronic kidney disease, stage 3a: Secondary | ICD-10-CM

## 2022-03-14 DIAGNOSIS — I10 Essential (primary) hypertension: Secondary | ICD-10-CM

## 2022-03-14 DIAGNOSIS — E782 Mixed hyperlipidemia: Secondary | ICD-10-CM

## 2022-03-14 DIAGNOSIS — N1832 Chronic kidney disease, stage 3b: Secondary | ICD-10-CM

## 2022-03-14 NOTE — Chronic Care Management (AMB) (Signed)
Chronic Care Management   CCM RN Visit Note  03/14/2022 Name: Madison Franklin MRN: [id number] DOB: March 23, 1946  Subjective: Madison Franklin is a 76 y.o. year old female who is a primary care patient of Cannady, Barbaraann Faster, NP. The care management team was consulted for assistance with disease management and care coordination needs.    Engaged with patient by telephone for follow up visit in response to provider referral for case management and/or care coordination services. Spoke to the patients husband Elenore Rota who is also DPR.  Consent to Services:  The patient was given information about Chronic Care Management services, agreed to services, and gave verbal consent prior to initiation of services.  Please see initial visit note for detailed documentation.   Patient agreed to services and verbal consent obtained.   Assessment: Review of patient past medical history, allergies, medications, health status, including review of consultants reports, laboratory and other test data, was performed as part of comprehensive evaluation and provision of chronic care management services.   SDOH (Social Determinants of Health) assessments and interventions performed:    CCM Care Plan  Allergies  Allergen Reactions   Latex Itching and Rash    With latex gloves (when worn)    Outpatient Encounter Medications as of 03/14/2022  Medication Sig   acetaminophen (TYLENOL) 325 MG tablet Take 325 mg by mouth every 6 (six) hours as needed (for pain.).   albuterol (VENTOLIN HFA) 108 (90 Base) MCG/ACT inhaler USE 2 INHALATIONS BY MOUTH EVERY 6 HOURS AS NEEDED FOR WHEEZING  OR SHORTNESS OF BREATH   amLODipine (NORVASC) 5 MG tablet TAKE 1 TABLET BY MOUTH  DAILY   aspirin EC 81 MG tablet Take 81 mg by mouth daily.   atorvastatin (LIPITOR) 20 MG tablet TAKE 1 TABLET BY MOUTH  DAILY   hydrochlorothiazide (MICROZIDE) 12.5 MG capsule Take 1 capsule (12.5 mg total) by mouth daily.   lisinopril (ZESTRIL) 40 MG tablet TAKE 1  TABLET BY MOUTH  DAILY   Tiotropium Bromide Monohydrate (SPIRIVA RESPIMAT) 2.5 MCG/ACT AERS Inhale 2 puffs into the lungs daily.   Tiotropium Bromide-Olodaterol (STIOLTO RESPIMAT) 2.5-2.5 MCG/ACT AERS Inhale 2.5 mcg into the lungs 2 (two) times daily.   Tiotropium Bromide-Olodaterol 2.5-2.5 MCG/ACT AERS Inhale 2 puffs into the lungs daily.   vitamin B-12 (CYANOCOBALAMIN) 1000 MCG tablet Take 1,000 mcg by mouth daily.   Vitamin D, Cholecalciferol, 1000 units TABS Take 1 tablet by mouth daily.   No facility-administered encounter medications on file as of 03/14/2022.    Patient Active Problem List   Diagnosis Date Noted   Mold exposure 03/20/2021   OSA (obstructive sleep apnea) 03/20/2021   IFG (impaired fasting glucose) 03/20/2021   Aortic atherosclerosis ([place]) 07/29/2020   Chronic respiratory failure with hypoxia ([place]) 07/29/2020   severe restrictive pulmonary physiology due to severe kyphoscoliosis 07/04/2020   Porcelain gallbladder 11/30/2019   Severe pulmonary hypertension ([place]) 10/02/2019   Intestinal metaplasia of gastric mucosa    Chronic atrophic gastritis without bleeding 12/15/2018   B12 deficiency 08/04/2018   Nodule of upper lobe of left lung 05/26/2018   Anemia in chronic kidney disease 05/26/2018   Coronary artery calcification 06/18/2017   Heart murmur 04/13/2017   Chronic kidney disease, stage 3 ([place]) 04/13/2017   Advanced care planning/counseling discussion 01/05/2017   Hx of colonic polyps    Benign neoplasm of cecum    Osteopenia 07/26/2015   COPD, severe ([place]) 07/26/2015   Essential hypertension 07/26/2015   Hyperlipidemia 07/26/2015  Conditions to be addressed/monitored:HTN, HLD, COPD, CKD Stage 3, and anemia  Care Plan : RNCM; General Plan of Care (Adult) for Chronic Disease Management and Care Coordination Needs  Updates made by Vanita Ingles, RN since 03/14/2022 12:00 AM     Problem: RNCM: Development of Plan of care for Chronic Disease Management  (HTN, HLD, COPD, CKD3 with anemia)   Priority: High     Long-Range Goal: RNCM: Effective Managment  of Plan of care for Chronic Disease Management (HTN, HLD, COPD, CKD3 with anemia)   Start Date: 09/03/2021  Expected End Date: 09/03/2022  Priority: High  Note:   Current Barriers:  Knowledge Deficits related to plan of care for management of HTN, HLD, COPD, and CKD Stage 3  Chronic Disease Management support and education needs related to HTN, HLD, COPD, and CKD Stage 3  RNCM Clinical Goal(s):  Patient will verbalize understanding of plan for management of HTN, HLD, COPD, and CKD Stage 3 as evidenced by following the plan of care, calling the office for changes and working with the CCM team to optimize health and well being  take all medications exactly as prescribed and will call provider for medication related questions as evidenced by taking as prescribed and calling for refills before running out of medications     attend all scheduled medical appointments: 03-20-2022 with the pcp  as evidenced by keeping appointments and calling the office for needed schedule changes         demonstrate a decrease in HTN, HLD, COPD, and CKD Stage 3 exacerbations  as evidenced by working with the CCM team and pcp to optimize health and well being demonstrate ongoing self health care management ability for effective management of chronic conditions  as evidenced by  working with the CCM team through collaboration with Consulting civil engineer, provider, and care team.   Interventions: 1:1 collaboration with primary care provider regarding development and update of comprehensive plan of care as evidenced by provider attestation and co-signature Inter-disciplinary care team collaboration (see longitudinal plan of care) Evaluation of current treatment plan related to  self management and patient's adherence to plan as established by provider   Chronic Kidney Disease (Status: Goal on Track (progressing): YES.)  Long  Term Goal  Last practice recorded BP readings:  BP Readings from Last 3 Encounters:  02/21/22 (!) 145/65  09/19/21 137/70  07/23/21 (!) 157/80  Most recent eGFR/CrCl:  Lab Results  Component Value Date   EGFR 34 (L) 09/19/2021    No components found for: CRCL  Assessed the patients spouse and DRP  understanding of chronic kidney disease. 01-03-2022: The patients spouse has a good understanding of CKD. The patient is compliant with labs and follow up with provider. The patient and spouse deny any new issues or concerns with CKD.   03-14-2022: The patient has an upcoming appointment with nephrology next week. The patients spouse is good about making sure the patient gets to her appointments and is following the plan of care.  Evaluation of current treatment plan related to chronic kidney disease self management and patient's adherence to plan as established by provider. 03-14-2022: Saw oncologist and will see pcp soon. Has referrals in place for nephrology. Is being monitored on a regular basis. The patient elected to monitor her anemia and deferred infusions at this time. The patient will follow up with oncology in 4 months. Her husband assist in her care. Is a strong support for the patient.    Provided education  to patient re: stroke prevention, s/s of heart attack and stroke    Reviewed prescribed diet heart healthy/Renal. 03-14-2022: The patients husband states that she is eating well and denies any new concerns. Does want her to be more active. Reviewed medications with patient and discussed importance of compliance. 03-14-2022: States compliance with medications.  The patients husband assist with medications management     Advised patient, providing education and rationale, to monitor blood pressure daily and record, calling PCP for findings outside established parameters    Discussed complications of poorly controlled blood pressure such as heart disease, stroke, circulatory complications, vision  complications, kidney impairment, sexual dysfunction    Discussed plans with patient for ongoing care management follow up and provided patient with direct contact information for care management team    Screening for signs and symptoms of depression related to chronic disease state      Discussed the impact of chronic kidney disease on daily life and mental health and acknowledged and normalized feelings of disempowerment, fear, and frustration    Assessed social determinant of health barriers    Provided education on kidney disease progression    Engage patient in early, proactive and ongoing discussion about goals of care and what matters most to them    Support coping and stress management by recognizing current strategies and assist in developing new strategies such as mindfulness, journaling, relaxation techniques, problem-solving      COPD: (Status: Goal on Track (progressing): YES.) Long Term Goal  Reviewed medications with patient, including use of prescribed maintenance and rescue inhalers, and provided. 11-05-2021: Review with the patient and patients husband about appointment with pharm D on 11-11-2021 at 2 pm. The inhaler the patient takes is cost prohibitive. Education on being available to discuss options with the pharm D. 03-14-2022: The patients husband states the patient is doing very well and has no new changes in her breathing or any new exacerbations since last outreach with COPD. The patients husband feels the patient is doing very well. The patient was sleeping in today and had not gotten up at the time of the call.  instruction on medication management and the importance of adherence. 03-14-2022: The patients husband assist with management of medications. Denies any new issues related to medication compliance.  Provided patient with basic written and verbal COPD education on self care/management/and exacerbation prevention Advised patient to track and manage COPD triggers.  03-14-2022: The patient and patients husband know the triggers to her COPD. Denies any acute exacerbations or findings at this time.  Provided written and verbal instructions on pursed lip breathing and utilized returned demonstration as teach back Provided instruction about proper use of medications used for management of COPD including inhalers Advised patient to self assesses COPD action plan zone and make appointment with provider if in the yellow zone for 48 hours without improvement Advised patient to engage in light exercise as tolerated 3-5 days a week to aid in the the management of COPD Provided education about and advised patient to utilize infection prevention strategies to reduce risk of respiratory infection. 03-14-2022: Review of safety measures and remaining safe in high risk season of infections with respiratory conditions presently. The patient does not go out a lot and practices safety measures to prevent infection.  Discussed the importance of adequate rest and management of fatigue with COPD. 11-05-2021: The patients husband states that sometimes he thinks the patient sleeps too much. 03-14-2022: The patient is resting well. No new changes in her  condition. Sleeping well. Had not gotten up at the time of the call. The patients husband is trying to get her to be more active with walking. She does not like to use her cane or walker. Encouraged use of DME to prevent falls and remain safe.  Screening for signs and symptoms of depression related to chronic disease state  Assessed social determinant of health barriers  Hyperlipidemia:  (Status: Goal on Track (progressing): YES.) Long Term Goal  Lab Results  Component Value Date   CHOL 187 09/19/2021   HDL 55 09/19/2021   LDLCALC 107 (H) 09/19/2021   TRIG 142 09/19/2021     Medication review performed; medication list updated in electronic medical record. 03-14-2022: Is compliant with Lipitor 20 mg daily. Denies any issues with  compliance.  Provider established cholesterol goals reviewed. 03-14-2022: Review of monitoring for foods high in cholesterol and eating a heart healthy diet. Denies any issues with appetite and eating well; Counseled on importance of regular laboratory monitoring as prescribed. 03-14-2022: The patient has regular lab work for monitoring of cholesterol levels; Provided HLD educational materials; Reviewed role and benefits of statin for ASCVD risk reduction; Discussed strategies to manage statin-induced myalgias; Reviewed importance of limiting foods high in cholesterol;  Hypertension: (Status: Goal on Track (progressing): YES.) Last practice recorded BP readings:  BP Readings from Last 3 Encounters:  02/21/22 (!) 145/65  09/19/21 137/70  07/23/21 (!) 157/80  Most recent eGFR/CrCl:  Lab Results  Component Value Date   EGFR 34 (L) 09/19/2021    No components found for: CRCL  Evaluation of current treatment plan related to hypertension self management and patient's adherence to plan as established by provider. 01-03-2022: Blood pressure stable at last appointment. The patient and patients husband denies any issues or concerns with blood pressures, HTN, or heart health. 03-14-2022: Blood pressures are stable. The patient is seeing pcp and specialist coming up next week;   Provided education to patient re: stroke prevention, s/s of heart attack and stroke; Reviewed prescribed diet heart healthy/Renal. 03-14-2022: The patient is complaint with dietary restrictions. The patient is eating well.   Reviewed medications with patient and discussed importance of compliance. 03-14-2022: The patient is compliant with medications;  Discussed plans with patient for ongoing care management follow up and provided patient with direct contact information for care management team; Provided education on prescribed diet heart healthy- renal;  Discussed complications of poorly controlled blood pressure such as heart  disease, stroke, circulatory complications, vision complications, kidney impairment, sexual dysfunction;   Patient Goals/Self-Care Activities: Patient will self administer medications as prescribed as evidenced by self report/primary caregiver report  Patient will attend all scheduled provider appointments as evidenced by clinician review of documented attendance to scheduled appointments and patient/caregiver report Patient will call pharmacy for medication refills as evidenced by patient report and review of pharmacy fill history as appropriate Patient will attend church or other social activities as evidenced by patient report Patient will continue to perform ADL's independently as evidenced by patient/caregiver report Patient will continue to perform IADL's independently as evidenced by patient/caregiver report Patient will call provider office for new concerns or questions as evidenced by review of documented incoming telephone call notes and patient report Patient will work with BSW to address care coordination needs and will continue to work with the clinical team to address health care and disease management related needs as evidenced by documented adherence to scheduled care management/care coordination appointments - avoid second hand smoke - eliminate smoking  in my home - identify and remove indoor air pollutants - limit outdoor activity during cold weather - listen for public air quality announcements every day - develop a rescue plan - eliminate symptom triggers at home - follow rescue plan if symptoms flare-up - keep follow-up appointments: 03-20-2022 with pcp  - use an extra pillow to sleep - develop a new routine to improve sleep - don't eat or exercise right before bedtime - eat healthy/prescribed diet: heart healthy diet  - get at least 7 to 8 hours of sleep at night - use devices that will help like a cane, sock-puller or reacher - practice relaxation or meditation daily -  do exercises in a comfortable position that makes breathing as easy as possible - check blood pressure weekly - choose a place to take my blood pressure (home, clinic or office, retail store) - write blood pressure results in a log or diary - learn about high blood pressure - take blood pressure log to all doctor appointments - call doctor for signs and symptoms of high blood pressure - develop an action plan for high blood pressure - keep all doctor appointments - take medications for blood pressure exactly as prescribed - begin an exercise program - report new symptoms to your doctor - eat more whole grains, fruits and vegetables, lean meats and healthy fats - call for medicine refill 2 or 3 days before it runs out - take all medications exactly as prescribed - call doctor with any symptoms you believe are related to your medicine - call doctor when you experience any new symptoms - go to all doctor appointments as scheduled - adhere to prescribed diet: Heart healthy diet        Plan:Telephone follow up appointment with care management team member scheduled for:  05-30-2022 at 12 am  Noreene Larsson RN, MSN, [facility] Mobile: [phone]

## 2022-03-14 NOTE — Patient Instructions (Signed)
Visit Information  Thank you for taking time to visit with me today. Please don't hesitate to contact me if I can be of assistance to you before our next scheduled telephone appointment.  Following are the goals we discussed today:  Chronic Kidney Disease (Status: Goal on Track (progressing): YES.)  Long Term Goal  Last practice recorded BP readings:     BP Readings from Last 3 Encounters:  02/21/22 (!) 145/65  09/19/21 137/70  07/23/21 (!) 157/80  Most recent eGFR/CrCl:       Lab Results  Component Value Date    EGFR 34 (L) 09/19/2021    No components found for: CRCL   Assessed the patients spouse and DRP  understanding of chronic kidney disease. 01-03-2022: The patients spouse has a good understanding of CKD. The patient is compliant with labs and follow up with provider. The patient and spouse deny any new issues or concerns with CKD.   03-14-2022: The patient has an upcoming appointment with nephrology next week. The patients spouse is good about making sure the patient gets to her appointments and is following the plan of care.  Evaluation of current treatment plan related to chronic kidney disease self management and patient's adherence to plan as established by provider. 03-14-2022: Saw oncologist and will see pcp soon. Has referrals in place for nephrology. Is being monitored on a regular basis. The patient elected to monitor her anemia and deferred infusions at this time. The patient will follow up with oncology in 4 months. Her husband assist in her care. Is a strong support for the patient.    Provided education to patient re: stroke prevention, s/s of heart attack and stroke    Reviewed prescribed diet heart healthy/Renal. 03-14-2022: The patients husband states that she is eating well and denies any new concerns. Does want her to be more active. Reviewed medications with patient and discussed importance of compliance. 03-14-2022: States compliance with medications.  The patients  husband assist with medications management     Advised patient, providing education and rationale, to monitor blood pressure daily and record, calling PCP for findings outside established parameters    Discussed complications of poorly controlled blood pressure such as heart disease, stroke, circulatory complications, vision complications, kidney impairment, sexual dysfunction    Discussed plans with patient for ongoing care management follow up and provided patient with direct contact information for care management team    Screening for signs and symptoms of depression related to chronic disease state      Discussed the impact of chronic kidney disease on daily life and mental health and acknowledged and normalized feelings of disempowerment, fear, and frustration    Assessed social determinant of health barriers    Provided education on kidney disease progression    Engage patient in early, proactive and ongoing discussion about goals of care and what matters most to them    Support coping and stress management by recognizing current strategies and assist in developing new strategies such as mindfulness, journaling, relaxation techniques, problem-solving        COPD: (Status: Goal on Track (progressing): YES.) Long Term Goal  Reviewed medications with patient, including use of prescribed maintenance and rescue inhalers, and provided. 11-05-2021: Review with the patient and patients husband about appointment with pharm D on 11-11-2021 at 2 pm. The inhaler the patient takes is cost prohibitive. Education on being available to discuss options with the pharm D. 03-14-2022: The patients husband states the patient is doing very well  and has no new changes in her breathing or any new exacerbations since last outreach with COPD. The patients husband feels the patient is doing very well. The patient was sleeping in today and had not gotten up at the time of the call.  instruction on medication management and  the importance of adherence. 03-14-2022: The patients husband assist with management of medications. Denies any new issues related to medication compliance.  Provided patient with basic written and verbal COPD education on self care/management/and exacerbation prevention Advised patient to track and manage COPD triggers. 03-14-2022: The patient and patients husband know the triggers to her COPD. Denies any acute exacerbations or findings at this time.  Provided written and verbal instructions on pursed lip breathing and utilized returned demonstration as teach back Provided instruction about proper use of medications used for management of COPD including inhalers Advised patient to self assesses COPD action plan zone and make appointment with provider if in the yellow zone for 48 hours without improvement Advised patient to engage in light exercise as tolerated 3-5 days a week to aid in the the management of COPD Provided education about and advised patient to utilize infection prevention strategies to reduce risk of respiratory infection. 03-14-2022: Review of safety measures and remaining safe in high risk season of infections with respiratory conditions presently. The patient does not go out a lot and practices safety measures to prevent infection.  Discussed the importance of adequate rest and management of fatigue with COPD. 11-05-2021: The patients husband states that sometimes he thinks the patient sleeps too much. 03-14-2022: The patient is resting well. No new changes in her condition. Sleeping well. Had not gotten up at the time of the call. The patients husband is trying to get her to be more active with walking. She does not like to use her cane or walker. Encouraged use of DME to prevent falls and remain safe.  Screening for signs and symptoms of depression related to chronic disease state  Assessed social determinant of health barriers   Hyperlipidemia:  (Status: Goal on Track (progressing):  YES.) Long Term Goal       Lab Results  Component Value Date    CHOL 187 09/19/2021    HDL 55 09/19/2021    LDLCALC 107 (H) 09/19/2021    TRIG 142 09/19/2021      Medication review performed; medication list updated in electronic medical record. 03-14-2022: Is compliant with Lipitor 20 mg daily. Denies any issues with compliance.  Provider established cholesterol goals reviewed. 03-14-2022: Review of monitoring for foods high in cholesterol and eating a heart healthy diet. Denies any issues with appetite and eating well; Counseled on importance of regular laboratory monitoring as prescribed. 03-14-2022: The patient has regular lab work for monitoring of cholesterol levels; Provided HLD educational materials; Reviewed role and benefits of statin for ASCVD risk reduction; Discussed strategies to manage statin-induced myalgias; Reviewed importance of limiting foods high in cholesterol;   Hypertension: (Status: Goal on Track (progressing): YES.) Last practice recorded BP readings:     BP Readings from Last 3 Encounters:  02/21/22 (!) 145/65  09/19/21 137/70  07/23/21 (!) 157/80  Most recent eGFR/CrCl:       Lab Results  Component Value Date    EGFR 34 (L) 09/19/2021    No components found for: CRCL   Evaluation of current treatment plan related to hypertension self management and patient's adherence to plan as established by provider. 01-03-2022: Blood pressure stable at last appointment. The patient  and patients husband denies any issues or concerns with blood pressures, HTN, or heart health. 03-14-2022: Blood pressures are stable. The patient is seeing pcp and specialist coming up next week;   Provided education to patient re: stroke prevention, s/s of heart attack and stroke; Reviewed prescribed diet heart healthy/Renal. 03-14-2022: The patient is complaint with dietary restrictions. The patient is eating well.   Reviewed medications with patient and discussed importance of compliance.  03-14-2022: The patient is compliant with medications;  Discussed plans with patient for ongoing care management follow up and provided patient with direct contact information for care management team; Provided education on prescribed diet heart healthy- renal;  Discussed complications of poorly controlled blood pressure such as heart disease, stroke, circulatory complications, vision complications, kidney impairment, sexual dysfunction;     Our next appointment is by telephone on 05-29-2022 at 1030 am   Please call the care guide team at [phone] if you need to cancel or reschedule your appointment.   If you are experiencing a Mental Health or [place] or need someone to talk to, please call the Suicide and Crisis Lifeline: 988 call the [place] National Suicide Prevention Lifeline: 253-735-5316 or TTY: 971-069-8594 TTY 914-091-1928) to talk to a trained counselor call 1-800-273-TALK (toll free, 24 hour hotline)   Patient verbalizes understanding of instructions and care plan provided today and agrees to view in [place]. Active MyChart status and patient understanding of how to access instructions and care plan via MyChart confirmed with patient.     Noreene Larsson RN, MSN, [facility] Mobile: [phone]

## 2022-03-16 NOTE — Patient Instructions (Incomplete)
Scheduled with lung doctor Schedule with kidney doctor (they will call) Schedule echo of heart (they will call)  Eating Plan for Chronic Obstructive Pulmonary Disease Chronic obstructive pulmonary disease (COPD) causes symptoms such as shortness of breath, coughing, and chest discomfort. These symptoms can make it difficult to eat enough to maintain a healthy weight. Generally, people with COPD should eat a diet that is high in calories, protein, and other nutrients to maintain body weight and to keep the lungs as healthy as possible. Depending on the medicines you take and other health conditions you may have, your health care provider may give you additional recommendations on what to eat or avoid. Talk with your health care provider about your goals for body weight, and work with a dietitian to develop an eating plan that is right for you. What are tips for following this plan? Reading food labels  Avoid foods with more than 300 milligrams (mg) of salt (sodium) per serving. Choose foods that contain at least 4 grams (g) of fiber per serving. Try to eat 20-30 g of fiber each day. Choose foods that are high in calories and protein, such as nuts, beans, yogurt, and cheese. Shopping Do not buy foods labeled as diet, low-calorie, or low-fat. If you are able to eat dairy products: Avoid low-fat or skim milk. Buy dairy products that have at least 2% fat. Buy nutritional supplement drinks. Buy grains and prepared foods labeled as enriched or fortified. Consider buying low-sodium, pre-made foods to conserve energy for eating. Cooking Add dry milk or protein powder to smoothies. Cook with healthy fats, such as olive oil, canola oil, sunflower oil, and grapeseed oil. Add oil, butter, cream cheese, or nut butters to foods to increase fat and calories. To make foods easier to chew and swallow: Cook vegetables, pasta, and rice until soft. Cut or grind meat into very small pieces. Dip breads in  liquid. Meal planning  Eat when you feel hungry. Eat 5-6 small meals throughout the day. Drink 6-8 glasses of water each day. Do not drink liquids with meals. Drink liquids at the end of the meal to avoid feeling full too quickly. Eat a variety of fruits and vegetables every day. Ask for assistance from family or friends with planning and preparing meals as needed. Avoid foods that cause you to feel bloated, such as carbonated drinks, fried foods, beans, broccoli, cabbage, and apples. For older adults, ask your local agency on aging whether you are eligible for meal assistance programs, such as Meals on Wheels. Lifestyle  Do not smoke. Eat slowly. Take small bites and chew food well before swallowing. Do not overeat. This may make it more difficult to breathe after eating. Sit up while eating. If needed, continue to use supplemental oxygen while eating. Rest or relax for 30 minutes before and after eating. Monitor your weight as told by your health care provider. Exercise as told by your health care provider. What foods should I eat? Fruits All fresh, dried, canned, or frozen fruits that do not cause gas. Vegetables All fresh, canned (no salt added), or frozen vegetables that do not cause gas. Grains Whole-grain bread. Enriched whole-grain pasta. Fortified whole-grain cereals. Fortified rice. Quinoa. Meats and other proteins Lean meat. Poultry. Fish. Dried beans. Unsalted nuts. Tofu. Eggs. Nut butters. Dairy Whole or 2% milk. Cheese. Yogurt. Fats and oils Olive oil. Canola oil. Butter. Margarine. Beverages Water. Vegetable juice (no salt added). Decaffeinated coffee. Decaffeinated or herbal tea. Seasonings and condiments Fresh or dried herbs.  Low-salt or salt-free seasonings. Low-sodium soy sauce. The items listed above may not be a complete list of foods and beverages you can eat. Contact a dietitian for more information. What foods should I avoid? Fruits Fruits that cause  gas, such as apples or melon. Vegetables Vegetables that cause gas, such as broccoli, Brussels sprouts, cabbage, cauliflower, and onions. Canned vegetables with added salt. Meats and other proteins Fried meat. Salt-cured meat. Processed meat. Dairy Fat-free or low-fat milk, yogurt, or cheese. Processed cheese. Beverages Carbonated drinks. Caffeinated drinks, such as coffee, tea, and soft drinks. Juice. Alcohol. Vegetable juice with added salt. Seasonings and condiments Salt. Seasoning mixes with salt. Soy sauce. [place]. Other foods Clear soup or broth. Fried foods. Prepared frozen meals. The items listed above may not be a complete list of foods and beverages you should avoid. Contact a dietitian for more information. Summary COPD symptoms can make it difficult to eat enough to maintain a healthy weight. A COPD eating plan can help you maintain your body weight and keep your lungs as healthy as possible. Eat a diet that is high in calories, protein, and other nutrients. Read labels to make sure that you are getting the right nutrients. Cook foods to make them easier to chew and swallow. Eat 5-6 small meals throughout the day, and avoid foods that cause gas or make you feel bloated. This information is not intended to replace advice given to you by your health care provider. Make sure you discuss any questions you have with your health care provider. Document Revised: 08/14/2020 Document Reviewed: 08/14/2020 Elsevier Patient Education  [place].

## 2022-03-19 DIAGNOSIS — E785 Hyperlipidemia, unspecified: Secondary | ICD-10-CM

## 2022-03-19 DIAGNOSIS — N183 Chronic kidney disease, stage 3 unspecified: Secondary | ICD-10-CM

## 2022-03-19 DIAGNOSIS — Z87891 Personal history of nicotine dependence: Secondary | ICD-10-CM

## 2022-03-19 DIAGNOSIS — J449 Chronic obstructive pulmonary disease, unspecified: Secondary | ICD-10-CM | POA: Diagnosis not present

## 2022-03-19 DIAGNOSIS — I129 Hypertensive chronic kidney disease with stage 1 through stage 4 chronic kidney disease, or unspecified chronic kidney disease: Secondary | ICD-10-CM

## 2022-03-20 ENCOUNTER — Ambulatory Visit (INDEPENDENT_AMBULATORY_CARE_PROVIDER_SITE_OTHER): Payer: Medicare Other | Admitting: Nurse Practitioner

## 2022-03-20 ENCOUNTER — Encounter: Payer: Self-pay | Admitting: Nurse Practitioner

## 2022-03-20 VITALS — BP 134/70 | HR 91 | Temp 98.3°F | Ht 61.0 in | Wt 160.4 lb

## 2022-03-20 DIAGNOSIS — K828 Other specified diseases of gallbladder: Secondary | ICD-10-CM | POA: Diagnosis not present

## 2022-03-20 DIAGNOSIS — R942 Abnormal results of pulmonary function studies: Secondary | ICD-10-CM | POA: Diagnosis not present

## 2022-03-20 DIAGNOSIS — J449 Chronic obstructive pulmonary disease, unspecified: Secondary | ICD-10-CM | POA: Diagnosis not present

## 2022-03-20 DIAGNOSIS — R911 Solitary pulmonary nodule: Secondary | ICD-10-CM

## 2022-03-20 DIAGNOSIS — I700 Atherosclerosis of aorta: Secondary | ICD-10-CM

## 2022-03-20 DIAGNOSIS — I10 Essential (primary) hypertension: Secondary | ICD-10-CM | POA: Diagnosis not present

## 2022-03-20 DIAGNOSIS — D631 Anemia in chronic kidney disease: Secondary | ICD-10-CM

## 2022-03-20 DIAGNOSIS — N1832 Chronic kidney disease, stage 3b: Secondary | ICD-10-CM

## 2022-03-20 DIAGNOSIS — Z974 Presence of external hearing-aid: Secondary | ICD-10-CM

## 2022-03-20 DIAGNOSIS — R011 Cardiac murmur, unspecified: Secondary | ICD-10-CM | POA: Diagnosis not present

## 2022-03-20 DIAGNOSIS — E782 Mixed hyperlipidemia: Secondary | ICD-10-CM

## 2022-03-20 DIAGNOSIS — E538 Deficiency of other specified B group vitamins: Secondary | ICD-10-CM

## 2022-03-20 DIAGNOSIS — J9611 Chronic respiratory failure with hypoxia: Secondary | ICD-10-CM | POA: Diagnosis not present

## 2022-03-20 DIAGNOSIS — I272 Pulmonary hypertension, unspecified: Secondary | ICD-10-CM | POA: Diagnosis not present

## 2022-03-20 DIAGNOSIS — G4733 Obstructive sleep apnea (adult) (pediatric): Secondary | ICD-10-CM | POA: Diagnosis not present

## 2022-03-20 DIAGNOSIS — M8588 Other specified disorders of bone density and structure, other site: Secondary | ICD-10-CM

## 2022-03-20 MED ORDER — AMLODIPINE BESYLATE 5 MG PO TABS: 5.0000 mg | ORAL_TABLET | Freq: Every day | ORAL | 4 refills | Status: DC

## 2022-03-20 MED ORDER — HYDROCHLOROTHIAZIDE 12.5 MG PO CAPS: 12.5000 mg | ORAL_CAPSULE | Freq: Every day | ORAL | 4 refills | Status: DC

## 2022-03-20 MED ORDER — LISINOPRIL 40 MG PO TABS: 40.0000 mg | ORAL_TABLET | Freq: Every day | ORAL | 4 refills | Status: DC

## 2022-03-20 MED ORDER — ATORVASTATIN CALCIUM 20 MG PO TABS: 20.0000 mg | ORAL_TABLET | Freq: Every day | ORAL | 4 refills | Status: DC

## 2022-03-20 NOTE — Assessment & Plan Note (Signed)
Chronic, ongoing.  Followed by hematology.  Continue oral supplement.   Labs up to date with hematology.

## 2022-03-20 NOTE — Assessment & Plan Note (Signed)
Followed by pulmonary, will continue this collaboration and recommend they schedule to return for follow-up. 

## 2022-03-20 NOTE — Progress Notes (Signed)
BP 134/70 (BP Location: Left Arm, Cuff Size: Normal)   Pulse 91   Temp 98.3 F (36.8 C) (Oral)   Ht '5\' 1"'$  (1.549 m)   Wt 160 lb 6.4 oz (72.8 kg)   LMP  (LMP Unknown)   SpO2 (!) 84%   BMI 30.31 kg/m    Subjective:    Patient ID: Madison Franklin, female    DOB: 05/08/1946, 76 y.o.   MRN: [id number]  HPI: Madison Franklin is a 76 y.o. female  Chief Complaint  Patient presents with   Hyperlipidemia   Chronic Kidney Disease   Anemia   Sleep Apnea   Hearing Problem    Patient husband states patient hearing aid is messed up and patient cannot hear as good.    Husband present at bedside to assist with HPI.  She reports her hearing aide is not working about one month ago, can not hear as well.  They saw provider about this and warranty has run out, so she can not get new one for 2-3 years -- they are going to take her to a location in [place] to see if they can obtain something to help.  HYPERTENSION / HYPERLIPIDEMIA Continues on Lisinopril, Amlodipine, and HCTZ + Atorvastatin.    Sees Dr. Dahlia Byes for porcelain gall bladder, which remainder stable at her recent visit 07/15/21, no surgical intervention at this time. Duration of hypertension: chronic BP monitoring frequency: daily BP range: 130-140/70-80 range BP medication side effects: no Duration of hyperlipidemia: chronic Cholesterol medication side effects: no Cholesterol supplements: none Medication compliance: good compliance Aspirin: yes Recent stressors: no Recurrent headaches: no Visual changes: no Palpitations: no Dyspnea: improving Chest pain: no Lower extremity edema: no Dizzy/lightheaded: no   COPD Last saw pulmonary on 06/18/21, they started Stiolto -- she was ordered echo but has obtained.  Due to her severe pulmonary hypertension she is on O2 supplementation 2L [place] when exerting and at bedtime, continues to wear mask.  To have yearly lung CT and they tried to contact her to follow-up, has lung nodules to monitor --  last 2019.  Quit smoking > 5 years ago.   Currently inhaler regimen: Stiolto and Albuterol. COPD status: stable Satisfied with current treatment?: yes Oxygen use: yes Dyspnea frequency: at baseline Cough frequency: occasional Rescue inhaler frequency:  once a day Limitation of activity: at times Productive cough: none Last Spirometry: with pulmonary Pneumovax: Up to Date Influenza: Up to Date    CHRONIC KIDNEY DISEASE Last saw nephrology in 2021. CKD status: stable Medications renally dose: yes Previous renal evaluation: no Pneumovax:  Up to Date Influenza Vaccine:  Up to Date    ANEMIA Sees hematology with last visit on 02/28/22 -- continues on oral B12 and iron.  She has had infusions in past. Anemia status: stable Etiology of anemia: Duration of anemia treatment:  Compliance with treatment: good compliance Iron supplementation side effects: no Severity of anemia: mild Fatigue: no Decreased exercise tolerance: at times  Dyspnea on exertion: no Palpitations: no Bleeding: no Pica: no  Relevant past medical, surgical, family and social history reviewed and updated as indicated. Interim medical history since our last visit reviewed. Allergies and medications reviewed and updated.  Review of Systems  Constitutional:  Negative for activity change, appetite change, diaphoresis, fatigue and fever.  Respiratory:  Positive for shortness of breath (improving) and wheezing (improving). Negative for cough and chest tightness.   Cardiovascular:  Negative for chest pain, palpitations and leg swelling.  Gastrointestinal: Negative.  Neurological: Negative.   Psychiatric/Behavioral: Negative.     Per HPI unless specifically indicated above     Objective:    BP 134/70 (BP Location: Left Arm, Cuff Size: Normal)   Pulse 91   Temp 98.3 F (36.8 C) (Oral)   Ht '5\' 1"'$  (1.549 m)   Wt 160 lb 6.4 oz (72.8 kg)   LMP  (LMP Unknown)   SpO2 (!) 84%   BMI 30.31 kg/m   Wt Readings from  Last 3 Encounters:  03/20/22 160 lb 6.4 oz (72.8 kg)  02/21/22 160 lb (72.6 kg)  09/19/21 164 lb 9.6 oz (74.7 kg)    Physical Exam Vitals and nursing note reviewed.  Constitutional:      General: She is awake. She is not in acute distress.    Appearance: She is well-developed, well-groomed and overweight. She is not ill-appearing.  HENT:     Head: Normocephalic.     Right Ear: Hearing normal.     Left Ear: Hearing normal.  Eyes:     General: Lids are normal.        Right eye: No discharge.        Left eye: No discharge.     Conjunctiva/sclera: Conjunctivae normal.     Pupils: Pupils are equal, round, and reactive to light.  Neck:     Thyroid: No thyromegaly.     Vascular: No carotid bruit.  Cardiovascular:     Rate and Rhythm: Normal rate and regular rhythm.     Heart sounds: Murmur heard.  Systolic murmur is present with a grade of 3/6.    No gallop.  Pulmonary:     Effort: Pulmonary effort is normal.     Breath sounds: Decreased breath sounds present.     Comments: Clear throughout, but diminished breath sounds throughout.  No wheezing noted on exam today, no rhonchi and no cough present.  No SOB with talking noted. Abdominal:     General: Bowel sounds are normal.     Palpations: Abdomen is soft.  Musculoskeletal:     Cervical back: Normal range of motion and neck supple.     Right lower leg: No edema.     Left lower leg: No edema.  Lymphadenopathy:     Cervical: No cervical adenopathy.  Skin:    General: Skin is warm and dry.  Neurological:     Mental Status: She is alert and oriented to person, place, and time.  Psychiatric:        Attention and Perception: Attention normal.        Mood and Affect: Mood normal.        Speech: Speech normal.        Behavior: Behavior normal. Behavior is cooperative.        Thought Content: Thought content normal.    Results for orders placed or performed in visit on 02/21/22  Iron and TIBC([facility])  Result Value Ref  Range   Iron 48 28 - 170 ug/dL   TIBC 346 250 - 450 ug/dL   Saturation Ratios 14 10.4 - 31.8 %   UIBC 298 ug/dL  Basic metabolic panel  Result Value Ref Range   Sodium 139 135 - 145 mmol/L   Potassium 4.4 3.5 - 5.1 mmol/L   Chloride 107 98 - 111 mmol/L   CO2 22 22 - 32 mmol/L   Glucose, Bld 102 (H) 70 - 99 mg/dL   BUN 37 (H) 8 - 23 mg/dL   Creatinine, Ser 1.55 (  H) 0.44 - 1.00 mg/dL   Calcium 9.5 8.9 - 10.3 mg/dL   GFR, Estimated 34 (L) >60 mL/min   Anion gap 10 5 - 15  Ferritin  Result Value Ref Range   Ferritin 37 11 - 307 ng/mL  CBC  Result Value Ref Range   WBC 8.2 4.0 - 10.5 K/uL   RBC 4.70 3.87 - 5.11 MIL/uL   Hemoglobin 12.0 12.0 - 15.0 g/dL   HCT 39.6 36.0 - 46.0 %   MCV 84.3 80.0 - 100.0 fL   MCH 25.5 (L) 26.0 - 34.0 pg   MCHC 30.3 30.0 - 36.0 g/dL   RDW 14.4 11.5 - 15.5 %   Platelets 166 150 - 400 K/uL   nRBC 0.0 0.0 - 0.2 %      Assessment & Plan:   Problem List Items Addressed This Visit       Cardiovascular and Mediastinum   Aortic atherosclerosis ([place])    Ongoing.  Noted on CT 07/25/2018.  Continue daily statin and ASA.  Continue cessation of smoking.         Relevant Medications   lisinopril (ZESTRIL) 40 MG tablet   hydrochlorothiazide (MICROZIDE) 12.5 MG capsule   atorvastatin (LIPITOR) 20 MG tablet   amLODipine (NORVASC) 5 MG tablet   Essential hypertension    Chronic, ongoing with BP at goal today in office and on home readings.  Continue current medication regimen and adjust as needed.  Will continue Lisinopril for kidney protection, consider change to Losartan next visit due to underlying COPD.  Recommend continue to monitor BP at home daily and focus on DASH diet.  Labs up to date with hematology.  Return in 6 months.       Relevant Medications   lisinopril (ZESTRIL) 40 MG tablet   hydrochlorothiazide (MICROZIDE) 12.5 MG capsule   atorvastatin (LIPITOR) 20 MG tablet   amLODipine (NORVASC) 5 MG tablet   Other Relevant Orders   TSH    Ambulatory referral to Nephrology   Severe pulmonary hypertension ([place]) - Primary    Chronic, ongoing.  Continue collaboration with pulmonary, has recommended they call to schedule follow-up ASAP as should have visit with them at least two times per year.  Continue current inhaler regimen and adjust as needed -- CCM team involved to assist.  Needs repeat CT lung, recommend they return call to scheduler and schedule scan.  Repeat echo ordered and recommend they obtain.       Relevant Medications   lisinopril (ZESTRIL) 40 MG tablet   hydrochlorothiazide (MICROZIDE) 12.5 MG capsule   atorvastatin (LIPITOR) 20 MG tablet   amLODipine (NORVASC) 5 MG tablet   Other Relevant Orders   ECHOCARDIOGRAM COMPLETE     Respiratory   Chronic respiratory failure with hypoxia (HCC)    Ongoing. Followed by pulmonary and continues O2 supplementation, which she reports has been beneficial -- will continue this, uses 2 L at baseline.       COPD, severe ([place])    Chronic, ongoing.  Continue collaboration with pulmonary and current inhaler regimen -- highly recommend they schedule follow-up with them.  Recommend she schedule annual lung CT scan for follow-up and provided husband number to call last visit, they have not called, highly recommend they schedule this.  Return in 6 months for follow-up, sooner if worsening symptoms.  CCM involved to get inhaler assist and speak with nurse.       Nodule of upper lobe of left lung    Recommend they  schedule repeat CT as team has tried to call her.       OSA (obstructive sleep apnea)    Followed by pulmonary, continue O2 at bedtime.         Digestive   Porcelain gallbladder    Followed by Dr. Dahlia Byes, continue this collaboration.  Recent note reviewed.         Genitourinary   Anemia in chronic kidney disease    Chronic, stable.  Continue collaboration with hematology, recent note reviewed.  Labs up to date with hematology.       Chronic kidney disease,  stage 3 (HCC)    Chronic, ongoing.  Will continue Lisinopril for kidney protection, consider change to Losartan next visit due to underlying COPD.  Labs up to date with hematology.  New referral to nephrology placed and recommend they return.       Relevant Orders   Ambulatory referral to Nephrology     Other   B12 deficiency    Chronic, ongoing.  Followed by hematology.  Continue oral supplement.   Labs up to date with hematology.       Hearing aid worn    Recommend they contact [place] location to work on new aides.       Heart murmur    Noted on exam, continue to monitor, return to cardiology as needed.  Repeat echo ordered.       Hyperlipidemia    Chronic, ongoing.  Continue current medication regimen and adjust as needed.  Lipid panel today.       Relevant Medications   lisinopril (ZESTRIL) 40 MG tablet   hydrochlorothiazide (MICROZIDE) 12.5 MG capsule   atorvastatin (LIPITOR) 20 MG tablet   amLODipine (NORVASC) 5 MG tablet   Other Relevant Orders   Lipid Panel w/o Chol/HDL Ratio   severe restrictive pulmonary physiology due to severe kyphoscoliosis    Followed by pulmonary, will continue this collaboration and recommend they schedule to return for follow-up.         Follow up plan: Return in about 6 months (around 09/19/2022) for Pulmonary hypertension, HTN/HLD, OSA, OSTEOPENIA, CKD.

## 2022-03-20 NOTE — Assessment & Plan Note (Signed)
Chronic, ongoing with BP at goal today in office and on home readings.  Continue current medication regimen and adjust as needed.  Will continue Lisinopril for kidney protection, consider change to Losartan next visit due to underlying COPD.  Recommend continue to monitor BP at home daily and focus on DASH diet.  Labs up to date with hematology.  Return in 6 months.

## 2022-03-20 NOTE — Assessment & Plan Note (Signed)
Chronic, ongoing.  Continue collaboration with pulmonary and current inhaler regimen -- highly recommend they schedule follow-up with them.  Recommend she schedule annual lung CT scan for follow-up and provided husband number to call last visit, they have not called, highly recommend they schedule this.  Return in 6 months for follow-up, sooner if worsening symptoms.  CCM involved to get inhaler assist and speak with nurse.

## 2022-03-20 NOTE — Assessment & Plan Note (Signed)
Recommend they contact [place] location to work on new aides.

## 2022-03-20 NOTE — Assessment & Plan Note (Signed)
Followed by pulmonary, continue O2 at bedtime. 

## 2022-03-20 NOTE — Assessment & Plan Note (Signed)
Chronic, stable.  Continue collaboration with hematology, recent note reviewed.  Labs up to date with hematology. 

## 2022-03-20 NOTE — Assessment & Plan Note (Signed)
Chronic, ongoing.  Will continue Lisinopril for kidney protection, consider change to Losartan next visit due to underlying COPD.  Labs up to date with hematology.  New referral to nephrology placed and recommend they return.

## 2022-03-20 NOTE — Assessment & Plan Note (Addendum)
Noted on exam, continue to monitor, return to cardiology as needed.  Repeat echo ordered.

## 2022-03-20 NOTE — Assessment & Plan Note (Signed)
Ongoing. Followed by pulmonary and continues O2 supplementation, which she reports has been beneficial -- will continue this, uses 2 L at baseline.

## 2022-03-20 NOTE — Assessment & Plan Note (Signed)
Chronic, ongoing.  Continue current medication regimen and adjust as needed. Lipid panel today. 

## 2022-03-20 NOTE — Assessment & Plan Note (Signed)
Followed by Dr. Pabon, continue this collaboration.  Recent note reviewed. 

## 2022-03-20 NOTE — Assessment & Plan Note (Signed)
Ongoing.  Noted on CT 07/25/2018.  Continue daily statin and ASA.  Continue cessation of smoking.

## 2022-03-20 NOTE — Assessment & Plan Note (Signed)
Recommend they schedule repeat CT as team has tried to call her.

## 2022-03-20 NOTE — Assessment & Plan Note (Addendum)
Chronic, ongoing.  Continue collaboration with pulmonary, has recommended they call to schedule follow-up ASAP as should have visit with them at least two times per year.  Continue current inhaler regimen and adjust as needed -- CCM team involved to assist.  Needs repeat CT lung, recommend they return call to scheduler and schedule scan.  Repeat echo ordered and recommend they obtain.

## 2022-03-21 LAB — LIPID PANEL W/O CHOL/HDL RATIO
Cholesterol, Total: 172 mg/dL (ref 100–199)
HDL: 53 mg/dL (ref >39)
LDL Chol Calc (NIH): 99 mg/dL (ref 0–99)
Triglycerides: 110 mg/dL (ref 0–149)
VLDL Cholesterol Cal: 20 mg/dL (ref 5–40)

## 2022-03-21 LAB — TSH: TSH: 0.536 u[IU]/mL (ref 0.450–4.500)

## 2022-03-21 NOTE — Progress Notes (Signed)
Good morning crew, please let Lovene and her husband know that her labs have returned.  She can continue current dose of Atorvastatin, but next visit we may increase a little to get lower levels.  Thyroid level is normal.  Any questions?  Have a wonderful day!! Keep being amazing!!  Thank you for allowing me to participate in your care.  I appreciate you. Kindest regards, Lanisha Stepanian

## 2022-03-25 DIAGNOSIS — N2581 Secondary hyperparathyroidism of renal origin: Secondary | ICD-10-CM | POA: Diagnosis not present

## 2022-03-25 DIAGNOSIS — N1832 Chronic kidney disease, stage 3b: Secondary | ICD-10-CM | POA: Diagnosis not present

## 2022-03-25 DIAGNOSIS — G4733 Obstructive sleep apnea (adult) (pediatric): Secondary | ICD-10-CM | POA: Diagnosis not present

## 2022-03-25 DIAGNOSIS — I10 Essential (primary) hypertension: Secondary | ICD-10-CM | POA: Diagnosis not present

## 2022-03-25 DIAGNOSIS — D631 Anemia in chronic kidney disease: Secondary | ICD-10-CM | POA: Diagnosis not present

## 2022-03-25 NOTE — Telephone Encounter (Signed)
Referral has been faxed.

## 2022-03-28 ENCOUNTER — Ambulatory Visit (INDEPENDENT_AMBULATORY_CARE_PROVIDER_SITE_OTHER): Payer: Medicare Other

## 2022-03-28 DIAGNOSIS — I361 Nonrheumatic tricuspid (valve) insufficiency: Secondary | ICD-10-CM

## 2022-03-28 DIAGNOSIS — I5030 Unspecified diastolic (congestive) heart failure: Secondary | ICD-10-CM | POA: Diagnosis not present

## 2022-03-28 DIAGNOSIS — I517 Cardiomegaly: Secondary | ICD-10-CM

## 2022-03-28 DIAGNOSIS — I272 Pulmonary hypertension, unspecified: Secondary | ICD-10-CM

## 2022-03-28 LAB — ECHOCARDIOGRAM COMPLETE
Area-P 1/2: 2.93 cm2
S' Lateral: 2.4 cm

## 2022-03-29 NOTE — Progress Notes (Signed)
Good morning crew, please let Madison Franklin and her husband know that her echo has returned. Her heart has a good pump, with stable ejection fraction.  The left ventricle has some mild enlargement.  Main concern is her right side of heart is moderately enlarged with elevated pulmonary artery (lung related) pressures -- I see she is still not scheduled to follow-up with Dr. Patsey Berthold, lung doctor, as I recommended.  Please ensure you schedule to follow-up with her ASAP == if she needs help scheduling please assist her.  Any questions? Keep being amazing!!  Thank you for allowing me to participate in your care.  I appreciate you. Kindest regards, Paradise Vensel

## 2022-04-04 ENCOUNTER — Other Ambulatory Visit: Payer: Self-pay

## 2022-04-05 DIAGNOSIS — J449 Chronic obstructive pulmonary disease, unspecified: Secondary | ICD-10-CM | POA: Diagnosis not present

## 2022-04-10 DIAGNOSIS — J449 Chronic obstructive pulmonary disease, unspecified: Secondary | ICD-10-CM | POA: Diagnosis not present

## 2022-04-28 ENCOUNTER — Other Ambulatory Visit: Payer: Self-pay | Admitting: Nurse Practitioner

## 2022-04-29 NOTE — Telephone Encounter (Signed)
Requested Prescriptions  Pending Prescriptions Disp Refills  . albuterol (VENTOLIN HFA) 108 (90 Base) MCG/ACT inhaler [Pharmacy Med Name: ALBUTEROL HFA 90MCG/ACT (PA)] 8 g 1    Sig: USE 2 INHALATIONS BY MOUTH EVERY 6 HOURS AS NEEDED FOR WHEEZING  OR SHORTNESS OF BREATH     Pulmonology:  Beta Agonists 2 Passed - 04/28/2022 10:14 PM      Passed - Last BP in normal range    BP Readings from Last 1 Encounters:  03/20/22 134/70         Passed - Last Heart Rate in normal range    Pulse Readings from Last 1 Encounters:  03/20/22 91         Passed - Valid encounter within last 12 months    Recent Outpatient Visits          1 month ago Severe pulmonary hypertension ([place])   [place] Cannady, Jolene T, NP   7 months ago Severe pulmonary hypertension ([place])   [place], Henrine Screws T, NP   1 year ago Pulmonary arterial hypertension ([place])   [place], Henrine Screws T, NP   1 year ago COPD, severe ([place])   [place], Jolene T, NP   2 years ago COPD, severe ([place])   [place], Barbaraann Faster, NP      Future Appointments            In 4 months Cannady, Barbaraann Faster, NP [facility]

## 2022-05-05 DIAGNOSIS — J449 Chronic obstructive pulmonary disease, unspecified: Secondary | ICD-10-CM | POA: Diagnosis not present

## 2022-05-10 DIAGNOSIS — J449 Chronic obstructive pulmonary disease, unspecified: Secondary | ICD-10-CM | POA: Diagnosis not present

## 2022-05-22 ENCOUNTER — Telehealth: Payer: Self-pay

## 2022-05-22 NOTE — Chronic Care Management (AMB) (Signed)
    Chronic Care Management Pharmacy Assistant   Name: Madison Franklin  MRN: [id number] DOB: [birth year]   Reason for Encounter: Disease State Hypertension    Recent office visits:  03/20/22-[name], NP (PCP) General follow up visit. Labs ordered. Ambulatory referral to Nephrology. Echocardiogram completed. Follow up in 6 months.   Recent consult visits:  02/28/22-Sarah M. Covington PA (Oncology) Follow up visit.  02/21/22-Sarah M. Covington PA (Oncology) General follow up visit.   Hospital visits:  None in previous 6 months  Medications: Outpatient Encounter Medications as of 05/22/2022  Medication Sig   acetaminophen (TYLENOL) 325 MG tablet Take 325 mg by mouth every 6 (six) hours as needed (for pain.).   albuterol (VENTOLIN HFA) 108 (90 Base) MCG/ACT inhaler USE 2 INHALATIONS BY MOUTH EVERY 6 HOURS AS NEEDED FOR WHEEZING  OR SHORTNESS OF BREATH   amLODipine (NORVASC) 5 MG tablet Take 1 tablet (5 mg total) by mouth daily.   aspirin EC 81 MG tablet Take 81 mg by mouth daily.   atorvastatin (LIPITOR) 20 MG tablet Take 1 tablet (20 mg total) by mouth daily.   hydrochlorothiazide (MICROZIDE) 12.5 MG capsule Take 1 capsule (12.5 mg total) by mouth daily.   lisinopril (ZESTRIL) 40 MG tablet Take 1 tablet (40 mg total) by mouth daily.   Tiotropium Bromide-Olodaterol (STIOLTO RESPIMAT) 2.5-2.5 MCG/ACT AERS Inhale 2.5 mcg into the lungs 2 (two) times daily.   vitamin B-12 (CYANOCOBALAMIN) 1000 MCG tablet Take 1,000 mcg by mouth daily.   Vitamin D, Cholecalciferol, 1000 units TABS Take 1 tablet by mouth daily.   No facility-administered encounter medications on file as of 05/22/2022.   Current antihypertensive regimen:  Amlodipine 5 mg take 1 tab daily Hydrochlorothiazide 12.5 mg take 1 capsule daily Lisinopril 40 mg take 1 tab daily  How often are you checking your Blood Pressure? 1-2x per week  Current home BP readings:          130/80 does not remember the day exactly   What  recent interventions/DTPs have been made by any provider to improve Blood Pressure control since last CPP Visit: None noted  Any recent hospitalizations or ED visits since last visit with CPP? No  What diet changes have been made to improve Blood Pressure Control?  Patient states she does not eat salty foods and has been eating good.  What exercise is being done to improve your Blood Pressure Control?  Patient states she sometimes go walking.  Adherence Review: Is the patient currently on ACE/ARB medication? Yes Does the patient have >5 day gap between last estimated fill dates? No   Care Gaps: [id number]:GYFV completed: Nov 10, 2018 INFLUENZA VACCINE:Last completed: Sep 19, 2021 Zoster Vaccines:Never done  Star Rating Drugs: Atorvastatin 20 mg Last filled:04/12/22 100 DS Lisinopril 40 mg Last filled:03/20/22 100 DS  Corrie Mckusick, [place]

## 2022-05-23 ENCOUNTER — Telehealth: Payer: Self-pay

## 2022-05-23 NOTE — Telephone Encounter (Signed)
Message left for the patient to call back about what day or time works for her for her Ultrasound of the gallbladder and follow up with Dr Dahlia Byes.

## 2022-05-30 ENCOUNTER — Telehealth: Payer: Medicare Other

## 2022-05-30 ENCOUNTER — Telehealth: Payer: Self-pay

## 2022-05-30 ENCOUNTER — Other Ambulatory Visit: Payer: Self-pay | Admitting: Nurse Practitioner

## 2022-05-30 NOTE — Telephone Encounter (Signed)
  Care Management   Follow Up Note   05/30/2022 Name: Madison Franklin MRN: [id number] DOB: 1946-09-22   Referred by: Venita Lick, NP Reason for referral : Chronic Care Management (RNCM: Follow up for Chronic Disease Management and Care Coordination Needs)   An unsuccessful telephone outreach was attempted today. The patient was referred to the case management team for assistance with care management and care coordination.   Follow Up Plan: No further follow up required: the patient has met the goals of care. This call was to follow up and do case closure.   Noreene Larsson RN, MSN, [facility] Mobile: [phone]

## 2022-06-02 NOTE — Telephone Encounter (Signed)
Requested Prescriptions  Pending Prescriptions Disp Refills  . albuterol (VENTOLIN HFA) 108 (90 Base) MCG/ACT inhaler [Pharmacy Med Name: ALBUTEROL HFA 90MCG/ACT (PA)] 17 g 1    Sig: USE 2 INHALATIONS BY MOUTH EVERY 6 HOURS AS NEEDED FOR WHEEZING  OR SHORTNESS OF BREATH     Pulmonology:  Beta Agonists 2 Passed - 05/30/2022 11:34 PM      Passed - Last BP in normal range    BP Readings from Last 1 Encounters:  03/20/22 134/70         Passed - Last Heart Rate in normal range    Pulse Readings from Last 1 Encounters:  03/20/22 91         Passed - Valid encounter within last 12 months    Recent Outpatient Visits          2 months ago Severe pulmonary hypertension ([place])   [place] Cannady, Jolene T, NP   8 months ago Severe pulmonary hypertension ([place])   [place], Henrine Screws T, NP   1 year ago Pulmonary arterial hypertension ([place])   [place], Henrine Screws T, NP   1 year ago COPD, severe ([place])   [place], Jolene T, NP   2 years ago COPD, severe ([place])   [place], Barbaraann Faster, NP      Future Appointments            In 3 months Cannady, Barbaraann Faster, NP [facility]

## 2022-06-03 ENCOUNTER — Other Ambulatory Visit: Payer: Self-pay

## 2022-06-03 DIAGNOSIS — K828 Other specified diseases of gallbladder: Secondary | ICD-10-CM

## 2022-06-05 DIAGNOSIS — J449 Chronic obstructive pulmonary disease, unspecified: Secondary | ICD-10-CM | POA: Diagnosis not present

## 2022-06-10 DIAGNOSIS — J449 Chronic obstructive pulmonary disease, unspecified: Secondary | ICD-10-CM | POA: Diagnosis not present

## 2022-06-25 DIAGNOSIS — G4733 Obstructive sleep apnea (adult) (pediatric): Secondary | ICD-10-CM | POA: Diagnosis not present

## 2022-06-30 ENCOUNTER — Telehealth: Payer: Self-pay | Admitting: Oncology

## 2022-06-30 NOTE — Telephone Encounter (Signed)
Patients husband called and requested appointments be changed to after 9/25. I have reschedule. Just wanted team to be aware.  Thank you

## 2022-07-01 ENCOUNTER — Other Ambulatory Visit: Payer: Medicare Other

## 2022-07-02 ENCOUNTER — Ambulatory Visit: Payer: Medicare Other

## 2022-07-03 ENCOUNTER — Encounter: Payer: Self-pay | Admitting: Hematology and Oncology

## 2022-07-03 NOTE — Progress Notes (Signed)
Erroneous encounter

## 2022-07-06 DIAGNOSIS — J449 Chronic obstructive pulmonary disease, unspecified: Secondary | ICD-10-CM | POA: Diagnosis not present

## 2022-07-09 ENCOUNTER — Ambulatory Visit: Payer: Medicare Other | Admitting: Oncology

## 2022-07-11 DIAGNOSIS — J449 Chronic obstructive pulmonary disease, unspecified: Secondary | ICD-10-CM | POA: Diagnosis not present

## 2022-07-16 ENCOUNTER — Inpatient Hospital Stay: Payer: Medicare Other | Attending: Oncology

## 2022-07-16 ENCOUNTER — Ambulatory Visit: Payer: Medicare Other | Admitting: Surgery

## 2022-07-16 DIAGNOSIS — D631 Anemia in chronic kidney disease: Secondary | ICD-10-CM | POA: Diagnosis not present

## 2022-07-16 DIAGNOSIS — D509 Iron deficiency anemia, unspecified: Secondary | ICD-10-CM | POA: Insufficient documentation

## 2022-07-16 DIAGNOSIS — N183 Chronic kidney disease, stage 3 unspecified: Secondary | ICD-10-CM | POA: Diagnosis not present

## 2022-07-16 LAB — CBC WITH DIFFERENTIAL/PLATELET
Abs Immature Granulocytes: 0.04 10*3/uL (ref 0.00–0.07)
Basophils Absolute: 0.1 10*3/uL (ref 0.0–0.1)
Basophils Relative: 1 %
Eosinophils Absolute: 0.2 10*3/uL (ref 0.0–0.5)
Eosinophils Relative: 2 %
HCT: 41 % (ref 36.0–46.0)
Hemoglobin: 12.5 g/dL (ref 12.0–15.0)
Immature Granulocytes: 0 %
Lymphocytes Relative: 12 %
Lymphs Abs: 1.2 10*3/uL (ref 0.7–4.0)
MCH: 25.8 pg — ABNORMAL LOW (ref 26.0–34.0)
MCHC: 30.5 g/dL (ref 30.0–36.0)
MCV: 84.5 fL (ref 80.0–100.0)
Monocytes Absolute: 0.6 10*3/uL (ref 0.1–1.0)
Monocytes Relative: 6 %
Neutro Abs: 7.4 10*3/uL (ref 1.7–7.7)
Neutrophils Relative %: 79 %
Platelets: 156 10*3/uL (ref 150–400)
RBC: 4.85 MIL/uL (ref 3.87–5.11)
RDW: 14.6 % (ref 11.5–15.5)
WBC: 9.5 10*3/uL (ref 4.0–10.5)
nRBC: 0 % (ref 0.0–0.2)

## 2022-07-16 LAB — COMPREHENSIVE METABOLIC PANEL
ALT: 9 U/L (ref 0–44)
AST: 18 U/L (ref 15–41)
Albumin: 4.3 g/dL (ref 3.5–5.0)
Alkaline Phosphatase: 91 U/L (ref 38–126)
Anion gap: 7 (ref 5–15)
BUN: 32 mg/dL — ABNORMAL HIGH (ref 8–23)
CO2: 22 mmol/L (ref 22–32)
Calcium: 9.6 mg/dL (ref 8.9–10.3)
Chloride: 108 mmol/L (ref 98–111)
Creatinine, Ser: 1.46 mg/dL — ABNORMAL HIGH (ref 0.44–1.00)
GFR, Estimated: 37 mL/min — ABNORMAL LOW (ref >60)
Glucose, Bld: 124 mg/dL — ABNORMAL HIGH (ref 70–99)
Potassium: 4.1 mmol/L (ref 3.5–5.1)
Sodium: 137 mmol/L (ref 135–145)
Total Bilirubin: 0.7 mg/dL (ref 0.3–1.2)
Total Protein: 7.8 g/dL (ref 6.5–8.1)

## 2022-07-16 LAB — FERRITIN: Ferritin: 67 ng/mL (ref 11–307)

## 2022-07-16 LAB — IRON AND TIBC
Iron: 65 ug/dL (ref 28–170)
Saturation Ratios: 19 % (ref 10.4–31.8)
TIBC: 346 ug/dL (ref 250–450)
UIBC: 281 ug/dL

## 2022-07-17 ENCOUNTER — Ambulatory Visit: Admission: RE | Admit: 2022-07-17 | Payer: Medicare Other | Source: Ambulatory Visit

## 2022-07-22 ENCOUNTER — Telehealth: Payer: Self-pay

## 2022-07-22 ENCOUNTER — Encounter: Payer: Self-pay | Admitting: Oncology

## 2022-07-22 ENCOUNTER — Inpatient Hospital Stay: Payer: Medicare Other | Attending: Oncology | Admitting: Oncology

## 2022-07-22 VITALS — BP 145/64 | HR 72 | Temp 98.2°F | Resp 18 | Wt 159.3 lb

## 2022-07-22 DIAGNOSIS — N183 Chronic kidney disease, stage 3 unspecified: Secondary | ICD-10-CM | POA: Diagnosis not present

## 2022-07-22 DIAGNOSIS — D631 Anemia in chronic kidney disease: Secondary | ICD-10-CM | POA: Insufficient documentation

## 2022-07-22 DIAGNOSIS — I129 Hypertensive chronic kidney disease with stage 1 through stage 4 chronic kidney disease, or unspecified chronic kidney disease: Secondary | ICD-10-CM | POA: Insufficient documentation

## 2022-07-22 DIAGNOSIS — I10 Essential (primary) hypertension: Secondary | ICD-10-CM | POA: Diagnosis not present

## 2022-07-22 DIAGNOSIS — D509 Iron deficiency anemia, unspecified: Secondary | ICD-10-CM | POA: Insufficient documentation

## 2022-07-22 DIAGNOSIS — Z87891 Personal history of nicotine dependence: Secondary | ICD-10-CM | POA: Insufficient documentation

## 2022-07-22 DIAGNOSIS — Z8719 Personal history of other diseases of the digestive system: Secondary | ICD-10-CM | POA: Diagnosis not present

## 2022-07-22 DIAGNOSIS — Z90710 Acquired absence of both cervix and uterus: Secondary | ICD-10-CM | POA: Insufficient documentation

## 2022-07-22 NOTE — Progress Notes (Signed)
Chronic Care Management Pharmacy Assistant   Name: Madison Franklin  MRN: [id number] DOB: 06-Jan-1946   Reason for Encounter: Disease State   Conditions to be addressed/monitored: HTN   Recent office visits:  None since last coordination call with CCM RN on 05/30/22  Recent consult visits:  07/22/22 Madison Guadeloupe, MD-Oncology (Anemia) No orders or medication changes  Hospital visits:  None in previous 6 months  Medications: Outpatient Encounter Medications as of 07/22/2022  Medication Sig   acetaminophen (TYLENOL) 325 MG tablet Take 325 mg by mouth every 6 (six) hours as needed (for pain.).   albuterol (VENTOLIN HFA) 108 (90 Base) MCG/ACT inhaler USE 2 INHALATIONS BY MOUTH EVERY 6 HOURS AS NEEDED FOR WHEEZING  OR SHORTNESS OF BREATH   amLODipine (NORVASC) 5 MG tablet Take 1 tablet (5 mg total) by mouth daily.   aspirin EC 81 MG tablet Take 81 mg by mouth daily.   atorvastatin (LIPITOR) 20 MG tablet Take 1 tablet (20 mg total) by mouth daily.   hydrochlorothiazide (MICROZIDE) 12.5 MG capsule Take 1 capsule (12.5 mg total) by mouth daily.   lisinopril (ZESTRIL) 40 MG tablet Take 1 tablet (40 mg total) by mouth daily.   Tiotropium Bromide-Olodaterol (STIOLTO RESPIMAT) 2.5-2.5 MCG/ACT AERS Inhale 2.5 mcg into the lungs 2 (two) times daily.   vitamin B-12 (CYANOCOBALAMIN) 1000 MCG tablet Take 1,000 mcg by mouth daily.   Vitamin D, Cholecalciferol, 1000 units TABS Take 1 tablet by mouth daily.   No facility-administered encounter medications on file as of 07/22/2022.    Recent Office Vitals: BP Readings from Last 3 Encounters:  07/22/22 (!) 145/64  03/20/22 134/70  02/21/22 (!) 145/65   Pulse Readings from Last 3 Encounters:  07/22/22 72  03/20/22 91  02/21/22 80    Wt Readings from Last 3 Encounters:  07/22/22 159 lb 4.8 oz (72.3 kg)  03/20/22 160 lb 6.4 oz (72.8 kg)  02/21/22 160 lb (72.6 kg)     Kidney Function Lab Results  Component Value Date/Time   CREATININE 1.46  (H) 07/16/2022 08:51 AM   CREATININE 1.55 (H) 02/21/2022 02:47 PM   GFRNONAA 37 (L) 07/16/2022 08:51 AM   GFRAA 41 (L) 05/29/2020 09:23 AM       Latest Ref Rng & Units 07/16/2022    8:51 AM 02/21/2022    2:47 PM 09/19/2021   10:16 AM  BMP  Glucose 70 - 99 mg/dL 124  102  111   BUN 8 - 23 mg/dL 32  37  30   Creatinine 0.44 - 1.00 mg/dL 1.46  1.55  1.56   BUN/Creat Ratio 12 - 28   19   Sodium 135 - 145 mmol/L 137  139  139   Potassium 3.5 - 5.1 mmol/L 4.1  4.4  5.3   Chloride 98 - 111 mmol/L 108  107  106   CO2 22 - 32 mmol/L '22  22  19   '$ Calcium 8.9 - 10.3 mg/dL 9.6  9.5  9.8    Reviewed chart prior to disease state call. 3 attempts was made to reach patient regarding BP, Left messages for patient to return calls.  Current antihypertensive regimen:  Amlodipine 5 mg  Lisinopril 40 mg HCTZ 12.5 mg  Note: Patient last blood pressure reading was 145/64 on 07/22/22 at  [place] at [place]. Patient next appt 08/25/22 with clinical pharmacist.  Adherence Review: Is the patient currently on ACE/ARB medication? Yes Does the patient have >  5 day gap between last estimated fill dates? No   Care Gaps: Colonoscopy-NA Diabetic Foot Exam-NA Mammogram-NA Ophthalmology-NA Dexa Scan - NA Annual Well Visit -  Micro albumin-NA Hemoglobin A1c- NA  Star Rating Drugs: Atorvastatin 20 mg-last fill 07/16/22 100 ds, 04/12/22 100 ds Lisinopril 40 mg-last fill 06/19/22 100 ds, 03/20/22 100 ds  [place] [phone]

## 2022-07-22 NOTE — Progress Notes (Signed)
Hematology/Oncology Consult note [facility]  [phone] [phone]  Patient Care Team: Venita Lick, NP as PCP - General (Nurse Practitioner) [name], MD as Consulting Physician (Gastroenterology) Anthonette Legato, MD (Internal Medicine) Jules Husbands, MD as Consulting Physician (General Surgery) Madelin Rear, [facility] (Inactive) (Pharmacist)   Name of the patient: Madison Franklin  [id number]  [birth year]   Date of visit: 07/22/22  Diagnosis- anemia likely multifactorial secondary to anemia of chronic kidney disease and iron deficiency  Chief complaint/ Reason for visit-routine follow-up of anemia  Heme/Onc history: patient is a 76 year old female who was seen previously by Dr. Mike Gip for iron deficiency anemia and anemia of chronic kidney disease.  She has received IV iron in the past.  Patient also has history of stage III CKD.   She has a history of colonic polyps.   Colonoscopy on 11/14/2016 revealed a sub-optimal prep. Findings included a 10 mm polyp in the cecum, seven 4-10 mm sessile polyps in the transverse colon, a 6 mm polyp in the descending colon, three 5-6 mm sessile polyps in the sigmoid colon,.  Pathology revealed 7 tubular adenomas, 2 hyperplastic polyps, and 2 inflammatory polyps.  Colonoscopy on 11/10/2018 revealed three 5 to 6 mm polyps in the ascending colon (2 tubular adenomas; sessile serrated adenoma), one 8 mm polyp in the transverse colon (tubular adenoma), one 5 mm polyp in the transverse colon (tubular adenoma), one 5 mm polyp in the sigmoid colon (tubular adenoma), one 7 mm polyp at the recto-sigmoid colon (hyperplastic polyp), and non-bleeding external and internal hemorrhoids.  She will have a follow-up colonoscopy in 3 years.  Patient has not required any EPO so far   EGD on 11/10/2018 revealed non-bleeding erosive gastropathy. Pathology revealed chronic inactive atrophic gastritis with intestinal metaplasia.   There was a single non-bleeding erosion in the upper third of the esophagus.  EGD on 05/11/2019 revealed normal duodenal bulb and second portion of the duodenum. There was non-bleeding erosive gastropathy and gastric mucosal atrophy.  There was a normal gastroesophageal junction and esophagus. Pathology revealed chronic inactive atrophic gastritis with intestinal metaplasia, negative for H pylori.   Interval history-patient reports baseline fatigue and exertional shortness of breath.No recent hospitalizations.  ECOG PS- 2 Pain scale- 0   Review of systems- Review of Systems  Constitutional:  Positive for malaise/fatigue. Negative for chills, fever and weight loss.  HENT:  Negative for congestion, ear discharge and nosebleeds.   Eyes:  Negative for blurred vision.  Respiratory:  Positive for shortness of breath. Negative for cough, hemoptysis, sputum production and wheezing.   Cardiovascular:  Negative for chest pain, palpitations, orthopnea and claudication.  Gastrointestinal:  Negative for abdominal pain, blood in stool, constipation, diarrhea, heartburn, melena, nausea and vomiting.  Genitourinary:  Negative for dysuria, flank pain, frequency, hematuria and urgency.  Musculoskeletal:  Negative for back pain, joint pain and myalgias.  Skin:  Negative for rash.  Neurological:  Negative for dizziness, tingling, focal weakness, seizures, weakness and headaches.  Endo/Heme/Allergies:  Does not bruise/bleed easily.  Psychiatric/Behavioral:  Negative for depression and suicidal ideas. The patient does not have insomnia.       Allergies  Allergen Reactions   Latex Itching and Rash    With latex gloves (when worn)     Past Medical History:  Diagnosis Date   COPD (chronic obstructive pulmonary disease) (HCC)    Dyspnea    Hearing aid worn    bilateral   Hyperlipidemia  Hypertension    Osteoporosis    Personal history of tobacco use, presenting hazards to health 08/15/2015    Pulmonary hypertension, mild (HCC)    mild to moderate     Past Surgical History:  Procedure Laterality Date   ABDOMINAL HYSTERECTOMY     COLONOSCOPY WITH PROPOFOL N/A 11/14/2016   Procedure: COLONOSCOPY WITH PROPOFOL;  Surgeon: [name], MD;  Location: [place];  Service: Endoscopy;  Laterality: N/A;   COLONOSCOPY WITH PROPOFOL N/A 11/10/2018   Procedure: COLONOSCOPY WITH PROPOFOL;  Surgeon: Lin Landsman, MD;  Location: [place];  Service: Endoscopy;  Laterality: N/A;   ESOPHAGOGASTRODUODENOSCOPY (EGD) WITH PROPOFOL  11/10/2018   Procedure: ESOPHAGOGASTRODUODENOSCOPY (EGD) WITH PROPOFOL;  Surgeon: Lin Landsman, MD;  Location: [place];  Service: Endoscopy;;   ESOPHAGOGASTRODUODENOSCOPY (EGD) WITH PROPOFOL N/A 05/11/2019   Procedure: ESOPHAGOGASTRODUODENOSCOPY (EGD) WITH PROPOFOL;  Surgeon: Lin Landsman, MD;  Location: [place];  Service: Endoscopy;  Laterality: N/A;   long nodule     POLYPECTOMY  11/14/2016   Procedure: POLYPECTOMY;  Surgeon: [name], MD;  Location: [place];  Service: Endoscopy;;   POLYPECTOMY  11/10/2018   Procedure: POLYPECTOMY;  Surgeon: Lin Landsman, MD;  Location: [place];  Service: Endoscopy;;   RIGHT HEART CATH Right 07/14/2017   Procedure: RIGHT HEART CATH;  Surgeon: Nelva Bush, MD;  Location: [place] [facility];  Service: Cardiovascular;  Laterality: Right;    Social History   Socioeconomic History   Marital status: Married    Spouse name: Not on file   Number of children: Not on file   Years of education: 12   Highest education level: 12th grade  Occupational History   Occupation: retired  Tobacco Use   Smoking status: Former    Packs/day: 0.50    Years: 30.00    Total pack years: 15.00    Types: Cigarettes    Quit date: 01/17/2017    Years since quitting: 5.5   Smokeless tobacco: Never  Vaping Use   Vaping Use: Never used  Substance and  Sexual Activity   Alcohol use: No    Alcohol/week: 0.0 standard drinks of alcohol   Drug use: No   Sexual activity: Yes  Other Topics Concern   Not on file  Social History Narrative   Not on file   Social Determinants of Health   Financial Resource Strain: Low Risk  (10/28/2021)   Overall Financial Resource Strain (CARDIA)    Difficulty of Paying Living Expenses: Not very hard  Food Insecurity: No Food Insecurity (10/28/2021)   Hunger Vital Sign    Worried About Running Out of Food in the Last Year: Never true    Thor in the Last Year: Never true  Transportation Needs: No Transportation Needs (11/11/2021)   PRAPARE - Hydrologist (Medical): No    Lack of Transportation (Non-Medical): No  Physical Activity: Insufficiently Active (10/28/2021)   Exercise Vital Sign    Days of Exercise per Week: 2 days    Minutes of Exercise per Session: 10 min  Stress: No Stress Concern Present (10/28/2021)   [place]    Feeling of Stress : Only a little  Social Connections: Socially Integrated (10/28/2021)   Social Connection and Isolation Panel [NHANES]    Frequency of Communication with Friends and Family: Twice a week    Frequency of Social Gatherings with Friends  and Family: Once a week    Attends Religious Services: More than 4 times per year    Active Member of Clubs or Organizations: Yes    Attends Archivist Meetings: 1 to 4 times per year    Marital Status: Married  Human resources officer Violence: Not At Risk (10/28/2021)   Humiliation, Afraid, Rape, and Kick questionnaire    Fear of Current or Ex-Partner: No    Emotionally Abused: No    Physically Abused: No    Sexually Abused: No    Family History  Problem Relation Age of Onset   Heart disease Neg Hx      Current Outpatient Medications:    acetaminophen (TYLENOL) 325 MG tablet, Take 325 mg by mouth every 6 (six) hours as  needed (for pain.)., Disp: , Rfl:    albuterol (VENTOLIN HFA) 108 (90 Base) MCG/ACT inhaler, USE 2 INHALATIONS BY MOUTH EVERY 6 HOURS AS NEEDED FOR WHEEZING  OR SHORTNESS OF BREATH, Disp: 17 g, Rfl: 1   amLODipine (NORVASC) 5 MG tablet, Take 1 tablet (5 mg total) by mouth daily., Disp: 90 tablet, Rfl: 4   aspirin EC 81 MG tablet, Take 81 mg by mouth daily., Disp: , Rfl:    atorvastatin (LIPITOR) 20 MG tablet, Take 1 tablet (20 mg total) by mouth daily., Disp: 90 tablet, Rfl: 4   hydrochlorothiazide (MICROZIDE) 12.5 MG capsule, Take 1 capsule (12.5 mg total) by mouth daily., Disp: 90 capsule, Rfl: 4   lisinopril (ZESTRIL) 40 MG tablet, Take 1 tablet (40 mg total) by mouth daily., Disp: 90 tablet, Rfl: 4   Tiotropium Bromide-Olodaterol (STIOLTO RESPIMAT) 2.5-2.5 MCG/ACT AERS, Inhale 2.5 mcg into the lungs 2 (two) times daily., Disp: 4 g, Rfl: 0   vitamin B-12 (CYANOCOBALAMIN) 1000 MCG tablet, Take 1,000 mcg by mouth daily., Disp: , Rfl:    Vitamin D, Cholecalciferol, 1000 units TABS, Take 1 tablet by mouth daily., Disp: , Rfl:   Physical exam:  Vitals:   07/22/22 1006  BP: (!) 145/64  Pulse: 72  Resp: 18  Temp: 98.2 F (36.8 C)  SpO2: 97%  Weight: 159 lb 4.8 oz (72.3 kg)   Physical Exam Constitutional:      Comments: Sitting in a wheelchair.  Appears in no acute distress  Cardiovascular:     Rate and Rhythm: Normal rate and regular rhythm.     Heart sounds: Normal heart sounds.  Pulmonary:     Effort: Pulmonary effort is normal.     Breath sounds: Normal breath sounds.  Abdominal:     General: Bowel sounds are normal.     Palpations: Abdomen is soft.  Skin:    General: Skin is warm and dry.  Neurological:     Mental Status: She is alert and oriented to person, place, and time.         Latest Ref Rng & Units 07/16/2022    8:51 AM  CMP  Glucose 70 - 99 mg/dL 124   BUN 8 - 23 mg/dL 32   Creatinine 0.44 - 1.00 mg/dL 1.46   Sodium 135 - 145 mmol/L 137   Potassium 3.5 - 5.1  mmol/L 4.1   Chloride 98 - 111 mmol/L 108   CO2 22 - 32 mmol/L 22   Calcium 8.9 - 10.3 mg/dL 9.6   Total Protein 6.5 - 8.1 g/dL 7.8   Total Bilirubin 0.3 - 1.2 mg/dL 0.7   Alkaline Phos 38 - 126 U/L 91   AST 15 - 41  U/L 18   ALT 0 - 44 U/L 9       Latest Ref Rng & Units 07/16/2022    8:51 AM  CBC  WBC 4.0 - 10.5 K/uL 9.5   Hemoglobin 12.0 - 15.0 g/dL 12.5   Hematocrit 36.0 - 46.0 % 41.0   Platelets 150 - 400 K/uL 156      Assessment and plan- Patient is a 76 y.o. female here for routine follow-up of anemia   Patient is a globin has gradually improved from 10.8 last year presently to 12.5.  Iron studies are within normal limits.  She does not require IV iron or EPO at this time.  We will repeat CBC ferritin iron studies in 4 months and 8 months and I will see her back in 8 months   Visit Diagnosis 1. Anemia in stage 3 chronic kidney disease, unspecified whether stage 3a or 3b CKD ([place])      Dr. Randa Evens, MD, MPH [facility] at [facility] [phone] 07/22/2022 1:02 PM

## 2022-07-30 ENCOUNTER — Ambulatory Visit: Payer: Medicare Other | Admitting: Surgery

## 2022-08-02 ENCOUNTER — Other Ambulatory Visit: Payer: Self-pay | Admitting: Nurse Practitioner

## 2022-08-05 DIAGNOSIS — J449 Chronic obstructive pulmonary disease, unspecified: Secondary | ICD-10-CM | POA: Diagnosis not present

## 2022-08-10 DIAGNOSIS — J449 Chronic obstructive pulmonary disease, unspecified: Secondary | ICD-10-CM | POA: Diagnosis not present

## 2022-08-15 ENCOUNTER — Encounter: Payer: Self-pay | Admitting: Nurse Practitioner

## 2022-08-25 ENCOUNTER — Telehealth: Payer: Medicare Other

## 2022-08-25 ENCOUNTER — Ambulatory Visit (INDEPENDENT_AMBULATORY_CARE_PROVIDER_SITE_OTHER): Payer: Medicare Other | Admitting: Pulmonary Disease

## 2022-08-25 ENCOUNTER — Encounter: Payer: Self-pay | Admitting: Pulmonary Disease

## 2022-08-25 VITALS — BP 124/80 | HR 89 | Temp 98.3°F | Ht 61.0 in | Wt 159.0 lb

## 2022-08-25 DIAGNOSIS — J449 Chronic obstructive pulmonary disease, unspecified: Secondary | ICD-10-CM | POA: Diagnosis not present

## 2022-08-25 DIAGNOSIS — I272 Pulmonary hypertension, unspecified: Secondary | ICD-10-CM

## 2022-08-25 DIAGNOSIS — J9611 Chronic respiratory failure with hypoxia: Secondary | ICD-10-CM

## 2022-08-25 DIAGNOSIS — G4733 Obstructive sleep apnea (adult) (pediatric): Secondary | ICD-10-CM

## 2022-08-25 DIAGNOSIS — R942 Abnormal results of pulmonary function studies: Secondary | ICD-10-CM

## 2022-08-25 MED ORDER — BREZTRI AEROSPHERE 160-9-4.8 MCG/ACT IN AERO: 2.0000 | INHALATION_SPRAY | Freq: Two times a day (BID) | RESPIRATORY_TRACT | 0 refills | Status: DC

## 2022-08-25 NOTE — Patient Instructions (Signed)
We are giving you a trial of an inhaler called Breztri that is 2 puffs twice a day.  Let us know how you do with the inhaler and we can call it into your pharmacy.  Make sure you rinse your mouth well after you use it.  Continue using your emergency inhaler as needed you can use it up to 3 times a day if needed.  Your new liter flow of oxygen is 3 L/min.  We will see you in follow-up in 3 months time call sooner should any new problems arise.

## 2022-08-25 NOTE — Progress Notes (Signed)
Subjective:    Patient ID: Madison Franklin, female    DOB: April 30, 1946, 76 y.o.   MRN: [id number] Patient Care Team: Venita Lick, NP as PCP - General (Nurse Practitioner) [name], MD as Consulting Physician (Gastroenterology) Anthonette Legato, MD (Internal Medicine) Jules Husbands, MD as Consulting Physician (General Surgery) Madelin Rear, [facility] (Inactive) (Pharmacist) Tyler Pita, MD as Consulting Physician (Pulmonary Disease)  Requesting MD/Service: [name], NP.  Current primary provider Marnee Guarneri, NP Date of initial consultation: 05/11/17 by Dr. Merton Border Reason for consultation: Former smoker, exertional dyspnea   PT PROFILE: 76 y.o. female former smoker (approx 15 P-Y history) referred for evaluation of exertional dyspnea   PROBLEMS: Former smoker Severe thoracic kyphoscoliosis Moderate to severe restrictive pulmonary physiology SEVERE pulmonary hypertension     DATA: CT chest 03/13/17: Severe thoracic kyphoscoliosis. 4 mm nodule in left upper lobe (decreased in size from previously). Minimal emphysema Echocardiogram 05/28/17: LVEF 60-65%.  Grade 1 diastolic dysfunction.  No significant valvular disease noted.  No evidence of right-sided overload. [place] 07/14/17: Mild to moderate pulmonary hypertension (mean PAP 34 mmHg, PVR 5.7 [place] units) PFTs 10/01/17: FVC: 1.28 L (49 %pred), FEV1: 0.83 > 0.91 L (41 > 45 %pred), FEV1/FVC: 65%; lung volume measurements invalid, DLCO 6.6 (31 %pred) ONO 10/26/17:  Minimal brief desaturation. Does not require nocturnal O2 LDCT chest 06/01/18: Lung-RADS 2, benign appearance or behavior. Aortic atherosclerosis, coronary artery atherosclerosis and emphysema. Pulmonary artery enlargement suggests pulmonary arterial hypertension. Gallbladder wall calcifications indicative of "porcelain gallbladder". Very large thyroid gland extending into upper mediastinum. 2D echo 06/12/2020: LVEF 60 to 65%.  Grade 1 diastolic dysfunction.   Elevated left atrial pressure.  Increased size of right ventricle.  Severely elevated pulmonary artery systolic pressure.  Estimated right ventricular systolic pressure 54.2 mmHg. PFTs 06/12/2020: Severe restriction, element of obstruction also evident by flow volume loop.  Diffusion capacity impaired moderately. Sleep study 07/16/2020: REM related AHI of 77.1, low O2 sats at 65%.  Severe sleep apnea with associated hypoventilation due to severe scoliosis. Echocardiogram 03/28/2022: LVEF 60 to 65%, mild LVH, grade 1 DD, severely elevated pulmonary artery systolic pressure estimated 68.3 mmHg right ventricular systolic pressure relatively unchanged from prior   INTERVAL: Last seen 18 June 2021 by me.  No worsening of symptoms in the interval.  Using CPAP at 7 cm H2O compliant.  No complaints.  Did not follow as instructed.  Chief Complaint  Patient presents with   Follow-up    SOB with exertion. No wheezing or cough. 2L O2 constant.     HPI Madison Franklin presents for follow-up today.  This is a belated visit.  She was last seen August 2022 and was instructed to follow-up in 3 months.  She has been using CPAP at 7 cm water pressure for severe sleep apnea.  Uses oxygen at 2 L/min continuous for chronic respiratory failure with hypoxia.  She has been doing well using oxygen 24/7.  Tolerating the portable concentrator well.  She is compliant with her oxygen therapy.  She and her husband remain with poor insight as to the severity of her issues.  She does not endorse any fevers, chills or sweats.  Since starting oxygen therapy, and CPAP, she sleeps well.  No dyspnea as long as she is using her oxygen.  Voices no active complaint today.   She is not using Stiolto.  Only uses albuterol as needed.  Initial oxygen saturations were 89% on 2 L/min via POC.  This was  increased to 3 L/min and patient was able to maintain 95% saturation.  Review of Systems A 10 point review of systems was performed and it is as  noted above otherwise negative.  Patient Active Problem List   Diagnosis Date Noted   Hearing aid worn 03/20/2022   Mold exposure 03/20/2021   OSA (obstructive sleep apnea) 03/20/2021   IFG (impaired fasting glucose) 03/20/2021   Aortic atherosclerosis ([place]) 07/29/2020   Chronic respiratory failure with hypoxia ([place]) 07/29/2020   severe restrictive pulmonary physiology due to severe kyphoscoliosis 07/04/2020   Porcelain gallbladder 11/30/2019   Severe pulmonary hypertension ([place]) 10/02/2019   Intestinal metaplasia of gastric mucosa    Chronic atrophic gastritis without bleeding 12/15/2018   B12 deficiency 08/04/2018   Nodule of upper lobe of left lung 05/26/2018   Anemia in chronic kidney disease 05/26/2018   Coronary artery calcification 06/18/2017   Heart murmur 04/13/2017   Chronic kidney disease, stage 3 ([place]) 04/13/2017   Advanced care planning/counseling discussion 01/05/2017   Hx of colonic polyps    Benign neoplasm of cecum    Osteopenia 07/26/2015   COPD, severe ([place]) 07/26/2015   Essential hypertension 07/26/2015   Hyperlipidemia 07/26/2015   Social History   Tobacco Use   Smoking status: Former    Packs/day: 0.50    Years: 30.00    Total pack years: 15.00    Types: Cigarettes    Quit date: 01/17/2017    Years since quitting: 5.6   Smokeless tobacco: Never  Substance Use Topics   Alcohol use: No    Alcohol/week: 0.0 standard drinks of alcohol   Allergies  Allergen Reactions   Latex Itching and Rash    With latex gloves (when worn)   No outpatient medications have been marked as taking for the 08/25/22 encounter (Office Visit) with Tyler Pita, MD.   Immunization History  Administered Date(s) Administered   Fluad Quad(high Dose 65+) 08/03/2020, 09/19/2021   Influenza, High Dose Seasonal PF 09/03/2016   Influenza,inj,Quad PF,6+ Mos 07/27/2015   Influenza,inj,quad, With Preservative 08/24/2018, 07/15/2019   Influenza-Unspecified 08/14/2017,  08/24/2018   Moderna Sars-Covid-2 Vaccination 10/18/2020   PFIZER(Purple Top)SARS-COV-2 Vaccination 12/15/2019, 01/11/2020   Pneumococcal Conjugate-13 01/12/2015   Pneumococcal Polysaccharide-23 07/01/2011   Td 07/27/2015       Objective:   Physical Exam BP 124/80 (BP Location: Left Arm, Cuff Size: Normal)   Pulse 89   Temp 98.3 F (36.8 C)   Ht '5\' 1"'$  (1.549 m)   Wt 159 lb (72.1 kg) Comment: last recorded weight. Patient is in a wheelchair  LMP  (LMP Unknown)   SpO2 (!) 89%   BMI 30.04 kg/m  GENERAL: Awake alert, no distress.   Transport chair, comfortable on oxygen via POC.   HEAD: Normocephalic, atraumatic.  Wears hearing aids. EYES: Pupils equal, round, reactive to light.  No scleral icterus.  MOUTH: Very poor dentition, oral mucosa moist. NECK: Supple.  Thyroid diffusely enlarged.  Deviated to the right. No JVD.  No adenopathy. PULMONARY: Distant breath sounds, coarse, no other adventitious sounds.Marland Kitchen CARDIOVASCULAR: S1 and S2. Regular rate and rhythm.  Loud holosystolic murmur grade 4/6 best heard at the left sternal border  ABDOMEN: Soft, nondistended, nontender. MUSCULOSKELETAL: Very severe kyphoscoliosis. NEUROLOGIC: No focal deficits. SKIN: Intact,warm,dry.  No overt rashes noted. PSYCH: Flat affect, normal behavior     Assessment & Plan:     ICD-10-CM   1. Severe pulmonary hypertension (HCC)  I27.20    This is due to chronic hypoxic  vasoconstriction Severe restrictive physiology due to severe kyphosis Unfortunately limited options for this patient    2. severe restrictive pulmonary physiology due to severe kyphoscoliosis  R94.2    Restrictive physiology obscures severity of COPD    3. Chronic obstructive pulmonary disease, unspecified COPD type ([place])  J44.9    Severity due to concomitant restriction Trial of Breztri 2 puffs twice a day Continue as needed albuterol    4. Chronic respiratory failure with hypoxia (HCC)  J96.11    Patient liter flow increased  to 3 L/min Patient was able to maintain saturations at 95%    5. OSA (obstructive sleep apnea)  G47.33    Compliance download shows compliance with CPAP CPAP at 7 cmH2O     Meds ordered this encounter  Medications   Budeson-Glycopyrrol-Formoterol (BREZTRI AEROSPHERE) 160-9-4.8 MCG/ACT AERO    Sig: Inhale 2 puffs into the lungs in the morning and at bedtime.    Dispense:  10.7 g    Refill:  0    Order Specific Question:   Lot Number?    Answer:   [id number]    Order Specific Question:   Expiration Date?    Answer:   01/18/2025    Order Specific Question:   Manufacturer?    Answer:   [employer] [[id number]]    Order Specific Question:   Quantity    Answer:   2   Patient has severe pulmonary hypertension, really not much change from prior echo, change appears to be minimal.  Unfortunately the etiology of her pulmonary hypertension has to do with chronic hypoxic vasoconstriction due to severe restrictive physiology from kyphoscoliosis and concomitant COPD.  We did adjust her liter flow of oxygen today.  She did receive samples of Breztri and is to let us know if this medication helps her with her overall breathing.  She is compliant with her CPAP.  Attempted to discuss end-of-life issues with the patient and her husband but they both have very poor insight as to the severity of Madison Franklin's condition.  We will see the patient in follow-up in 3 months time she is to contact us prior to that time should any new difficulties arise.  Renold Don, MD Advanced Bronchoscopy PCCM [facility]    *This note was dictated using voice recognition software/Dragon.  Despite best efforts to proofread, errors can occur which can change the meaning. Any transcriptional errors that result from this process are unintentional and may not be fully corrected at the time of dictation.

## 2022-08-26 ENCOUNTER — Telehealth: Payer: Self-pay | Admitting: Pulmonary Disease

## 2022-08-26 NOTE — Telephone Encounter (Signed)
Pt's daughter, [name] called and had concerns about pt's visit yesterday and the note, "Attempted to discuss end-of-life issues with the patient and her husband but they both have very poor insight as to the severity of Kaylean's condition." [name] would like to discuss. Please call back at [phone].

## 2022-08-26 NOTE — Telephone Encounter (Signed)
Dr. Patsey Berthold, please see below message and advise. [name] is listed on DPR.

## 2022-08-26 NOTE — Telephone Encounter (Signed)
noted 

## 2022-08-26 NOTE — Telephone Encounter (Signed)
I discussed the visit with Madison Franklin and encouraged her to come to follow-up visits with her mom and her dad.  Madison Franklin agrees that they seem to have a more optimistic view of how Ms. Ariauna is doing.  I assured Madison Franklin that I just want to make sure that there is a plan should her mother's condition further deteriorate.  She understood and was appreciative for the call.

## 2022-09-02 DIAGNOSIS — H6123 Impacted cerumen, bilateral: Secondary | ICD-10-CM | POA: Diagnosis not present

## 2022-09-02 DIAGNOSIS — H903 Sensorineural hearing loss, bilateral: Secondary | ICD-10-CM | POA: Diagnosis not present

## 2022-09-03 ENCOUNTER — Other Ambulatory Visit: Payer: Self-pay | Admitting: Nurse Practitioner

## 2022-09-03 NOTE — Telephone Encounter (Signed)
Requested Prescriptions  Pending Prescriptions Disp Refills   albuterol (VENTOLIN HFA) 108 (90 Base) MCG/ACT inhaler [Pharmacy Med Name: ALBUTEROL HFA 90MCG/ACT (PA)] 17 g 0    Sig: USE 2 INHALATIONS BY MOUTH EVERY 6 HOURS AS NEEDED FOR WHEEZING  OR SHORTNESS OF BREATH     Pulmonology:  Beta Agonists 2 Passed - 09/03/2022  8:03 AM      Passed - Last BP in normal range    BP Readings from Last 1 Encounters:  08/25/22 124/80         Passed - Last Heart Rate in normal range    Pulse Readings from Last 1 Encounters:  08/25/22 89         Passed - Valid encounter within last 12 months    Recent Outpatient Visits           5 months ago Severe pulmonary hypertension ([place])   [place], Jolene T, NP   11 months ago Severe pulmonary hypertension ([place])   [place], Henrine Screws T, NP   1 year ago Pulmonary arterial hypertension ([place])   [place], [place] T, NP   2 years ago COPD, severe ([place])   [place], Jolene T, NP   2 years ago COPD, severe ([place])   [place], Barbaraann Faster, NP       Future Appointments             In 3 weeks Cannady, Barbaraann Faster, NP [facility]

## 2022-09-05 DIAGNOSIS — J449 Chronic obstructive pulmonary disease, unspecified: Secondary | ICD-10-CM | POA: Diagnosis not present

## 2022-09-10 ENCOUNTER — Encounter: Payer: Self-pay | Admitting: Hematology and Oncology

## 2022-09-10 DIAGNOSIS — J449 Chronic obstructive pulmonary disease, unspecified: Secondary | ICD-10-CM | POA: Diagnosis not present

## 2022-09-10 NOTE — Telephone Encounter (Signed)
Signing encounter, see note 10/25/20

## 2022-09-18 DIAGNOSIS — G4733 Obstructive sleep apnea (adult) (pediatric): Secondary | ICD-10-CM | POA: Diagnosis not present

## 2022-09-19 ENCOUNTER — Ambulatory Visit: Payer: Medicare Other | Admitting: Nurse Practitioner

## 2022-09-21 NOTE — Patient Instructions (Signed)
Please call to schedule your mammogram and/or bone density: [facility] at [place]: [address], [place], [place] [address] Phone: [phone]  [place] [facility] [address] [place],  [place]  [address] Phone: [phone]    Eating Plan for Chronic Obstructive Pulmonary Disease Chronic obstructive pulmonary disease (COPD) causes symptoms such as shortness of breath, coughing, and chest discomfort. These symptoms can make it difficult to eat enough to maintain a healthy weight. Generally, people with COPD should eat a diet that is high in calories, protein, and other nutrients to maintain body weight and to keep the lungs as healthy as possible. Depending on the medicines you take and other health conditions you may have, your health care provider may give you additional recommendations on what to eat or avoid. Talk with your health care provider about your goals for body weight, and work with a dietitian to develop an eating plan that is right for you. What are tips for following this plan? Reading food labels  Avoid foods with more than 300 milligrams (mg) of salt (sodium) per serving. Choose foods that contain at least 4 grams (g) of fiber per serving. Try to eat 20-30 g of fiber each day. Choose foods that are high in calories and protein, such as nuts, beans, yogurt, and cheese. Shopping Do not buy foods labeled as diet, low-calorie, or low-fat. If you are able to eat dairy products: Avoid low-fat or skim milk. Buy dairy products that have at least 2% fat. Buy nutritional supplement drinks. Buy grains and prepared foods labeled as enriched or fortified. Consider buying low-sodium, pre-made foods to conserve energy for eating. Cooking Add dry milk or protein powder to smoothies. Cook with healthy fats, such as olive oil, canola oil, sunflower oil, and grapeseed oil. Add oil, butter, cream cheese, or nut  butters to foods to increase fat and calories. To make foods easier to chew and swallow: Cook vegetables, pasta, and rice until soft. Cut or grind meat into very small pieces. Dip breads in liquid. Meal planning  Eat when you feel hungry. Eat 5-6 small meals throughout the day. Drink 6-8 glasses of water each day. Do not drink liquids with meals. Drink liquids at the end of the meal to avoid feeling full too quickly. Eat a variety of fruits and vegetables every day. Ask for assistance from family or friends with planning and preparing meals as needed. Avoid foods that cause you to feel bloated, such as carbonated drinks, fried foods, beans, broccoli, cabbage, and apples. For older adults, ask your local agency on aging whether you are eligible for meal assistance programs, such as Meals on Wheels. Lifestyle  Do not smoke. Eat slowly. Take small bites and chew food well before swallowing. Do not overeat. This may make it more difficult to breathe after eating. Sit up while eating. If needed, continue to use supplemental oxygen while eating. Rest or relax for 30 minutes before and after eating. Monitor your weight as told by your health care provider. Exercise as told by your health care provider. What foods should I eat? Fruits All fresh, dried, canned, or frozen fruits that do not cause gas. Vegetables All fresh, canned (no salt added), or frozen vegetables that do not cause gas. Grains Whole-grain bread. Enriched whole-grain pasta. Fortified whole-grain cereals. Fortified rice. Quinoa. Meats and other proteins Lean meat. Poultry. Fish. Dried beans. Unsalted nuts. Tofu. Eggs. Nut butters. Dairy Whole or 2%  milk. Cheese. Yogurt. Fats and oils Olive oil. Canola oil. Butter. Margarine. Beverages Water. Vegetable juice (no salt added). Decaffeinated coffee. Decaffeinated or herbal tea. Seasonings and condiments Fresh or dried herbs. Low-salt or salt-free seasonings. Low-sodium  soy sauce. The items listed above may not be a complete list of foods and beverages you can eat. Contact a dietitian for more information. What foods should I avoid? Fruits Fruits that cause gas, such as apples or melon. Vegetables Vegetables that cause gas, such as broccoli, Brussels sprouts, cabbage, cauliflower, and onions. Canned vegetables with added salt. Meats and other proteins Fried meat. Salt-cured meat. Processed meat. Dairy Fat-free or low-fat milk, yogurt, or cheese. Processed cheese. Beverages Carbonated drinks. Caffeinated drinks, such as coffee, tea, and soft drinks. Juice. Alcohol. Vegetable juice with added salt. Seasonings and condiments Salt. Seasoning mixes with salt. Soy sauce. Angie Fava. Other foods Clear soup or broth. Fried foods. Prepared frozen meals. The items listed above may not be a complete list of foods and beverages you should avoid. Contact a dietitian for more information. Summary COPD symptoms can make it difficult to eat enough to maintain a healthy weight. A COPD eating plan can help you maintain your body weight and keep your lungs as healthy as possible. Eat a diet that is high in calories, protein, and other nutrients. Read labels to make sure that you are getting the right nutrients. Cook foods to make them easier to chew and swallow. Eat 5-6 small meals throughout the day, and avoid foods that cause gas or make you feel bloated. This information is not intended to replace advice given to you by your health care provider. Make sure you discuss any questions you have with your health care provider. Document Revised: 08/14/2020 Document Reviewed: 08/14/2020 Elsevier Patient Education  [place].

## 2022-09-24 ENCOUNTER — Encounter: Payer: Self-pay | Admitting: Nurse Practitioner

## 2022-09-24 ENCOUNTER — Ambulatory Visit (INDEPENDENT_AMBULATORY_CARE_PROVIDER_SITE_OTHER): Payer: Medicare Other | Admitting: Nurse Practitioner

## 2022-09-24 VITALS — BP 128/62 | HR 84 | Temp 98.0°F | Resp 20 | Ht 60.98 in | Wt 161.6 lb

## 2022-09-24 DIAGNOSIS — G4733 Obstructive sleep apnea (adult) (pediatric): Secondary | ICD-10-CM

## 2022-09-24 DIAGNOSIS — D631 Anemia in chronic kidney disease: Secondary | ICD-10-CM

## 2022-09-24 DIAGNOSIS — J9611 Chronic respiratory failure with hypoxia: Secondary | ICD-10-CM

## 2022-09-24 DIAGNOSIS — I10 Essential (primary) hypertension: Secondary | ICD-10-CM | POA: Diagnosis not present

## 2022-09-24 DIAGNOSIS — J449 Chronic obstructive pulmonary disease, unspecified: Secondary | ICD-10-CM

## 2022-09-24 DIAGNOSIS — R011 Cardiac murmur, unspecified: Secondary | ICD-10-CM

## 2022-09-24 DIAGNOSIS — K828 Other specified diseases of gallbladder: Secondary | ICD-10-CM | POA: Diagnosis not present

## 2022-09-24 DIAGNOSIS — R7301 Impaired fasting glucose: Secondary | ICD-10-CM | POA: Diagnosis not present

## 2022-09-24 DIAGNOSIS — N1832 Chronic kidney disease, stage 3b: Secondary | ICD-10-CM

## 2022-09-24 DIAGNOSIS — M8588 Other specified disorders of bone density and structure, other site: Secondary | ICD-10-CM

## 2022-09-24 DIAGNOSIS — I272 Pulmonary hypertension, unspecified: Secondary | ICD-10-CM | POA: Diagnosis not present

## 2022-09-24 DIAGNOSIS — I700 Atherosclerosis of aorta: Secondary | ICD-10-CM

## 2022-09-24 DIAGNOSIS — E782 Mixed hyperlipidemia: Secondary | ICD-10-CM | POA: Diagnosis not present

## 2022-09-24 DIAGNOSIS — Z23 Encounter for immunization: Secondary | ICD-10-CM

## 2022-09-24 DIAGNOSIS — R942 Abnormal results of pulmonary function studies: Secondary | ICD-10-CM

## 2022-09-24 MED ORDER — BREZTRI AEROSPHERE 160-9-4.8 MCG/ACT IN AERO: 2.0000 | INHALATION_SPRAY | Freq: Two times a day (BID) | RESPIRATORY_TRACT | 4 refills | Status: DC

## 2022-09-24 NOTE — Assessment & Plan Note (Signed)
Chronic.  Noted on scan in 2014 -- check Vit D level today and continue supplement.  Ordered repeat DEXA and instructed her and husband on how to call and schedule.

## 2022-09-24 NOTE — Assessment & Plan Note (Signed)
Chronic, ongoing.  Will continue Lisinopril for kidney protection, consider change to Losartan next visit due to underlying COPD.   Continue collaboration with nephrology and hematology.

## 2022-09-24 NOTE — Assessment & Plan Note (Signed)
Chronic, stable.  Continue collaboration with hematology, recent note reviewed.  Labs up to date with hematology.

## 2022-09-24 NOTE — Assessment & Plan Note (Signed)
Followed by pulmonary, continue O2 at bedtime.

## 2022-09-24 NOTE — Assessment & Plan Note (Signed)
Noted on exam, continue to monitor, return to cardiology as needed.

## 2022-09-24 NOTE — Assessment & Plan Note (Signed)
Chronic.  Noted on CT 07/25/2018.  Continue daily statin and ASA.  Continue cessation of smoking.

## 2022-09-24 NOTE — Assessment & Plan Note (Signed)
Chronic, progressive in nature.  Continue collaboration with pulmonary.  Continue current inhaler regimen and adjust as needed -- CCM team involved to assist.  Needs repeat CT lung, recommend they return call to scheduler and schedule scan.  Consider cardiology in future.

## 2022-09-24 NOTE — Assessment & Plan Note (Signed)
Chronic, ongoing.  Continue collaboration with pulmonary and current inhaler regimen.  Recommend she schedule annual lung CT scan for follow-up and provided husband number to call last visit, they have not called, highly recommend they schedule this.  Return in 6 months for follow-up, sooner if worsening symptoms.  CCM involved to get inhaler assist and speak with nurse.

## 2022-09-24 NOTE — Assessment & Plan Note (Addendum)
Ongoing. Followed by pulmonary and continues O2 supplementation, which she reports has been beneficial -- will continue this, uses 2-3 L at baseline.

## 2022-09-24 NOTE — Assessment & Plan Note (Signed)
Recent A1c stable -- educated patient on this and normal findings with no prediabetes or diabetes.

## 2022-09-24 NOTE — Assessment & Plan Note (Signed)
Followed by Dr. Dahlia Byes, continue this collaboration.  Recent note reviewed.

## 2022-09-24 NOTE — Assessment & Plan Note (Signed)
Followed by pulmonary, will continue this collaboration and recommend they schedule to return for follow-up.

## 2022-09-24 NOTE — Progress Notes (Signed)
BP 128/62 (BP Location: Left Arm, Patient Position: Sitting, Cuff Size: Normal)   Pulse 84   Temp 98 F (36.7 C) (Oral)   Resp 20   Ht 5' 0.98" (1.549 m)   Wt 161 lb 9.6 oz (73.3 kg)   LMP  (LMP Unknown)   SpO2 90%   BMI 30.55 kg/m    Subjective:    Patient ID: Madison Franklin, female    DOB: Nov 14, 1945, 76 y.o.   MRN: [id number]  HPI: Madison Franklin is a 76 y.o. female  Chief Complaint  Patient presents with   Hypertension   Hyperlipidemia   Pulmonary Hypertension   Chronic Kidney Disease   osteopenia   Sleep Apnea   Allergic Rhinitis    Husband present at bedside to assist with HPI.  HYPERTENSION / HYPERLIPIDEMIA Continues on Lisinopril, Amlodipine, and HCTZ + Atorvastatin.  Was followed by Dr. Dahlia Byes for porcelain gall bladder, which remains stable at her recent visit 07/15/21, no surgical intervention at this time.  Osteopenia noted past imaging -- needs repeat DEXA. Duration of hypertension: chronic BP monitoring frequency: daily BP range: 130-140/70-80 range -- prior to coming here was 135/84 BP medication side effects: no Duration of hyperlipidemia: chronic Cholesterol medication side effects: no Cholesterol supplements: none Medication compliance: good compliance Aspirin: yes Recent stressors: no Recurrent headaches: no Visual changes: no Palpitations: no Dyspnea: baseline, no worsening Chest pain: no Lower extremity edema: no Dizzy/lightheaded: no   COPD Last saw pulmonary on 08/25/22, they are doing a trial with [facility] for patient.  Due to her severe pulmonary hypertension she is on O2 supplementation 2L [place] when exerting and at bedtime, continues to wear mask.  To have yearly lung CT and they have tried to contact her to follow-up, has lung nodules to monitor -- last 2019 -- have discussed at visits with patient.  Quit smoking > 5 years ago.    Had echo on 03/28/22 with EF 60-65% -- there is severe elevated pulmonary systolic pressure and right atrial size  moderately dilated. COPD status: stable Satisfied with current treatment?: yes Oxygen use: yes Dyspnea frequency: at baseline Cough frequency: occasional Rescue inhaler frequency:  once a day Limitation of activity: at times Productive cough: none Last Spirometry: with pulmonary Pneumovax: Up to Date Influenza: Up to Date    CHRONIC KIDNEY DISEASE Last saw nephrology 03/25/22 = CRT 1.71, eGFR 30. CKD status: stable Medications renally dose: yes Previous renal evaluation: no Pneumovax:  Up to Date Influenza Vaccine:  Up to Date    ANEMIA Followed by hematology with last visit on 07/22/22 -- continues on oral B12 and iron.  Has had infusions in past. Anemia status: stable Etiology of anemia: Duration of anemia treatment:  Compliance with treatment: good compliance Iron supplementation side effects: no Severity of anemia: mild Fatigue: sometimes Decreased exercise tolerance: at times  Dyspnea on exertion: no Palpitations: no Bleeding: no Pica: no  Relevant past medical, surgical, family and social history reviewed and updated as indicated. Interim medical history since our last visit reviewed. Allergies and medications reviewed and updated.  Review of Systems  Constitutional:  Negative for activity change, appetite change, diaphoresis, fatigue and fever.  Respiratory:  Positive for shortness of breath (improving) and wheezing (improving). Negative for cough and chest tightness.   Cardiovascular:  Negative for chest pain, palpitations and leg swelling.  Gastrointestinal: Negative.   Neurological: Negative.   Psychiatric/Behavioral: Negative.      Per HPI unless specifically indicated above  Objective:    BP 128/62 (BP Location: Left Arm, Patient Position: Sitting, Cuff Size: Normal)   Pulse 84   Temp 98 F (36.7 C) (Oral)   Resp 20   Ht 5' 0.98" (1.549 m)   Wt 161 lb 9.6 oz (73.3 kg)   LMP  (LMP Unknown)   SpO2 90%   BMI 30.55 kg/m   Wt Readings from Last 3  Encounters:  09/24/22 161 lb 9.6 oz (73.3 kg)  08/25/22 159 lb (72.1 kg)  07/22/22 159 lb 4.8 oz (72.3 kg)    Physical Exam Vitals and nursing note reviewed.  Constitutional:      General: She is awake. She is not in acute distress.    Appearance: She is well-developed, well-groomed and overweight. She is not ill-appearing.  HENT:     Head: Normocephalic.     Right Ear: Hearing normal.     Left Ear: Hearing normal.  Eyes:     General: Lids are normal.        Right eye: No discharge.        Left eye: No discharge.     Conjunctiva/sclera: Conjunctivae normal.     Pupils: Pupils are equal, round, and reactive to light.  Neck:     Thyroid: No thyromegaly.     Vascular: No carotid bruit.  Cardiovascular:     Rate and Rhythm: Normal rate and regular rhythm.     Heart sounds: Murmur heard.     Systolic murmur is present with a grade of 3/6.     No gallop.  Pulmonary:     Effort: Pulmonary effort is normal.     Breath sounds: Decreased breath sounds and wheezing present.     Comments: Clear throughout, but diminished breath sounds throughout.  No wheezing, no rhonchi, and no cough present.  No SOB with talking noted. Abdominal:     General: Bowel sounds are normal.     Palpations: Abdomen is soft.  Musculoskeletal:     Cervical back: Normal range of motion and neck supple.     Right lower leg: No edema.     Left lower leg: No edema.  Lymphadenopathy:     Cervical: No cervical adenopathy.  Skin:    General: Skin is warm and dry.  Neurological:     Mental Status: She is alert and oriented to person, place, and time.  Psychiatric:        Attention and Perception: Attention normal.        Mood and Affect: Mood normal.        Speech: Speech normal.        Behavior: Behavior normal. Behavior is cooperative.        Thought Content: Thought content normal.    Results for orders placed or performed in visit on 07/16/22  Iron and TIBC  Result Value Ref Range   Iron 65 28 - 170  ug/dL   TIBC 346 250 - 450 ug/dL   Saturation Ratios 19 10.4 - 31.8 %   UIBC 281 ug/dL  Ferritin  Result Value Ref Range   Ferritin 67 11 - 307 ng/mL  Comprehensive metabolic panel  Result Value Ref Range   Sodium 137 135 - 145 mmol/L   Potassium 4.1 3.5 - 5.1 mmol/L   Chloride 108 98 - 111 mmol/L   CO2 22 22 - 32 mmol/L   Glucose, Bld 124 (H) 70 - 99 mg/dL   BUN 32 (H) 8 - 23 mg/dL  Creatinine, Ser 1.46 (H) 0.44 - 1.00 mg/dL   Calcium 9.6 8.9 - 10.3 mg/dL   Total Protein 7.8 6.5 - 8.1 g/dL   Albumin 4.3 3.5 - 5.0 g/dL   AST 18 15 - 41 U/L   ALT 9 0 - 44 U/L   Alkaline Phosphatase 91 38 - 126 U/L   Total Bilirubin 0.7 0.3 - 1.2 mg/dL   GFR, Estimated 37 (L) >60 mL/min   Anion gap 7 5 - 15  CBC with Differential/Platelet  Result Value Ref Range   WBC 9.5 4.0 - 10.5 K/uL   RBC 4.85 3.87 - 5.11 MIL/uL   Hemoglobin 12.5 12.0 - 15.0 g/dL   HCT 41.0 36.0 - 46.0 %   MCV 84.5 80.0 - 100.0 fL   MCH 25.8 (L) 26.0 - 34.0 pg   MCHC 30.5 30.0 - 36.0 g/dL   RDW 14.6 11.5 - 15.5 %   Platelets 156 150 - 400 K/uL   nRBC 0.0 0.0 - 0.2 %   Neutrophils Relative % 79 %   Neutro Abs 7.4 1.7 - 7.7 K/uL   Lymphocytes Relative 12 %   Lymphs Abs 1.2 0.7 - 4.0 K/uL   Monocytes Relative 6 %   Monocytes Absolute 0.6 0.1 - 1.0 K/uL   Eosinophils Relative 2 %   Eosinophils Absolute 0.2 0.0 - 0.5 K/uL   Basophils Relative 1 %   Basophils Absolute 0.1 0.0 - 0.1 K/uL   Immature Granulocytes 0 %   Abs Immature Granulocytes 0.04 0.00 - 0.07 K/uL      Assessment & Plan:   Problem List Items Addressed This Visit       Cardiovascular and Mediastinum   Aortic atherosclerosis (HCC)    Chronic.  Noted on CT 07/25/2018.  Continue daily statin and ASA.  Continue cessation of smoking.        Relevant Orders   Comprehensive metabolic panel   Lipid Panel w/o Chol/HDL Ratio   Essential hypertension   Relevant Orders   Comprehensive metabolic panel   Severe pulmonary hypertension ([place]) - Primary     Chronic, progressive in nature.  Continue collaboration with pulmonary.  Continue current inhaler regimen and adjust as needed -- CCM team involved to assist.  Needs repeat CT lung, recommend they return call to scheduler and schedule scan.  Consider cardiology in future.      Relevant Orders   CBC with Differential/Platelet     Respiratory   Chronic respiratory failure with hypoxia (HCC)    Ongoing. Followed by pulmonary and continues O2 supplementation, which she reports has been beneficial -- will continue this, uses 2-3 L at baseline.      Relevant Orders   CBC with Differential/Platelet   COPD, severe (HCC)    Chronic, ongoing.  Continue collaboration with pulmonary and current inhaler regimen.  Recommend she schedule annual lung CT scan for follow-up and provided husband number to call last visit, they have not called, highly recommend they schedule this.  Return in 6 months for follow-up, sooner if worsening symptoms.  CCM involved to get inhaler assist and speak with nurse.      Relevant Medications   Budeson-Glycopyrrol-Formoterol (BREZTRI AEROSPHERE) 160-9-4.8 MCG/ACT AERO   Other Relevant Orders   CBC with Differential/Platelet   OSA (obstructive sleep apnea)    Followed by pulmonary, continue O2 at bedtime.        Digestive   Porcelain gallbladder    Followed by Dr. Dahlia Byes, continue this collaboration.  Recent  note reviewed.      Relevant Orders   Comprehensive metabolic panel     Endocrine   IFG (impaired fasting glucose)    Recent A1c stable -- educated patient on this and normal findings with no prediabetes or diabetes.      Relevant Orders   HgB A1c     Musculoskeletal and Integument   Osteopenia    Chronic.  Noted on scan in 2014 -- check Vit D level today and continue supplement.  Ordered repeat DEXA and instructed her and husband on how to call and schedule.      Relevant Orders   VITAMIN D 25 Hydroxy (Vit-D Deficiency, Fractures)   DG Bone Density      Genitourinary   Anemia in chronic kidney disease    Chronic, stable.  Continue collaboration with hematology, recent note reviewed.  Labs up to date with hematology.      Chronic kidney disease, stage 3 (HCC)    Chronic, ongoing.  Will continue Lisinopril for kidney protection, consider change to Losartan next visit due to underlying COPD.   Continue collaboration with nephrology and hematology.      Relevant Orders   CBC with Differential/Platelet   Comprehensive metabolic panel     Other   Heart murmur    Noted on exam, continue to monitor, return to cardiology as needed.        Hyperlipidemia   Relevant Orders   Comprehensive metabolic panel   Lipid Panel w/o Chol/HDL Ratio   severe restrictive pulmonary physiology due to severe kyphoscoliosis    Followed by pulmonary, will continue this collaboration and recommend they schedule to return for follow-up.      Other Visit Diagnoses     Flu vaccine need       Flu vaccine in office today.   Relevant Orders   Flu Vaccine QUAD High Dose(Fluad)        Follow up plan: Return in about 6 months (around 03/26/2023) for COPD, HTN/HLD, PULMONARY HYPERTENSION, OSTEOPENIA.

## 2022-09-25 ENCOUNTER — Other Ambulatory Visit: Payer: Self-pay | Admitting: Nurse Practitioner

## 2022-09-25 DIAGNOSIS — E875 Hyperkalemia: Secondary | ICD-10-CM

## 2022-09-25 LAB — COMPREHENSIVE METABOLIC PANEL
ALT: 6 IU/L (ref 0–32)
AST: 12 IU/L (ref 0–40)
Albumin/Globulin Ratio: 1.8 (ref 1.2–2.2)
Albumin: 4.6 g/dL (ref 3.8–4.8)
Alkaline Phosphatase: 105 IU/L (ref 44–121)
BUN/Creatinine Ratio: 23 (ref 12–28)
BUN: 38 mg/dL — ABNORMAL HIGH (ref 8–27)
Bilirubin Total: 0.3 mg/dL (ref 0.0–1.2)
CO2: 18 mmol/L — ABNORMAL LOW (ref 20–29)
Calcium: 9.8 mg/dL (ref 8.7–10.3)
Chloride: 106 mmol/L (ref 96–106)
Creatinine, Ser: 1.64 mg/dL — ABNORMAL HIGH (ref 0.57–1.00)
Globulin, Total: 2.6 g/dL (ref 1.5–4.5)
Glucose: 125 mg/dL — ABNORMAL HIGH (ref 70–99)
Potassium: 5.3 mmol/L — ABNORMAL HIGH (ref 3.5–5.2)
Sodium: 138 mmol/L (ref 134–144)
Total Protein: 7.2 g/dL (ref 6.0–8.5)
eGFR: 32 mL/min/{1.73_m2} — ABNORMAL LOW (ref >59)

## 2022-09-25 LAB — CBC WITH DIFFERENTIAL/PLATELET
Basophils Absolute: 0.1 10*3/uL (ref 0.0–0.2)
Basos: 1 %
EOS (ABSOLUTE): 0.2 10*3/uL (ref 0.0–0.4)
Eos: 3 %
Hematocrit: 37 % (ref 34.0–46.6)
Hemoglobin: 11.1 g/dL (ref 11.1–15.9)
Immature Grans (Abs): 0 10*3/uL (ref 0.0–0.1)
Immature Granulocytes: 0 %
Lymphocytes Absolute: 1 10*3/uL (ref 0.7–3.1)
Lymphs: 12 %
MCH: 25.4 pg — ABNORMAL LOW (ref 26.6–33.0)
MCHC: 30 g/dL — ABNORMAL LOW (ref 31.5–35.7)
MCV: 85 fL (ref 79–97)
Monocytes Absolute: 0.6 10*3/uL (ref 0.1–0.9)
Monocytes: 7 %
Neutrophils Absolute: 6.4 10*3/uL (ref 1.4–7.0)
Neutrophils: 77 %
Platelets: 169 10*3/uL (ref 150–450)
RBC: 4.37 x10E6/uL (ref 3.77–5.28)
RDW: 13.6 % (ref 11.7–15.4)
WBC: 8.4 10*3/uL (ref 3.4–10.8)

## 2022-09-25 LAB — LIPID PANEL W/O CHOL/HDL RATIO
Cholesterol, Total: 180 mg/dL (ref 100–199)
HDL: 54 mg/dL (ref >39)
LDL Chol Calc (NIH): 96 mg/dL (ref 0–99)
Triglycerides: 176 mg/dL — ABNORMAL HIGH (ref 0–149)
VLDL Cholesterol Cal: 30 mg/dL (ref 5–40)

## 2022-09-25 LAB — VITAMIN D 25 HYDROXY (VIT D DEFICIENCY, FRACTURES): Vit D, 25-Hydroxy: 40.2 ng/mL (ref 30.0–100.0)

## 2022-09-25 LAB — HEMOGLOBIN A1C
Est. average glucose Bld gHb Est-mCnc: 105 mg/dL
Hgb A1c MFr Bld: 5.3 % (ref 4.8–5.6)

## 2022-09-25 NOTE — Progress Notes (Signed)
Good morning, patient does not check MyChart -- please call her: Good day Madison Franklin, your labs have returned: - CBC is stable with no anemia noted - Kidney function continues to show baseline kidney disease stage 3b.  Liver function is normal. - Potassium is a little elevated -- please cut back on foods rich in potassium like bananas, potatoes, dried fruit, mangoes.  Will recheck this in two weeks on outpatient labs -- please schedule outpatient lab visit for patient. - Remainder of labs are all stable.  Any questions? Keep being amazing!!  Thank you for allowing me to participate in your care.  I appreciate you. Kindest regards, Timmothy Baranowski

## 2022-09-25 NOTE — Telephone Encounter (Signed)
Requested Prescriptions  Pending Prescriptions Disp Refills   albuterol (VENTOLIN HFA) 108 (90 Base) MCG/ACT inhaler [Pharmacy Med Name: ALBUTEROL HFA 90MCG/ACT (PA)] 54 g 1    Sig: USE 2 INHALATIONS BY MOUTH EVERY 6 HOURS AS NEEDED FOR WHEEZING  OR SHORTNESS OF BREATH     Pulmonology:  Beta Agonists 2 Passed - 09/25/2022  6:34 AM      Passed - Last BP in normal range    BP Readings from Last 1 Encounters:  09/24/22 128/62         Passed - Last Heart Rate in normal range    Pulse Readings from Last 1 Encounters:  09/24/22 84         Passed - Valid encounter within last 12 months    Recent Outpatient Visits           Yesterday Severe pulmonary hypertension ([place])   [place], Jolene T, NP   6 months ago Severe pulmonary hypertension ([place])   [place] Cannady, Jolene T, NP   1 year ago Severe pulmonary hypertension ([place])   [place] Cannady, Henrine Screws T, NP   1 year ago Pulmonary arterial hypertension ([place])   [place], [place] T, NP   2 years ago COPD, severe ([place])   [place], Barbaraann Faster, NP       Future Appointments             In 6 months Cannady, Barbaraann Faster, NP [facility]

## 2022-10-05 DIAGNOSIS — J449 Chronic obstructive pulmonary disease, unspecified: Secondary | ICD-10-CM | POA: Diagnosis not present

## 2022-10-06 ENCOUNTER — Telehealth: Payer: Self-pay

## 2022-10-06 NOTE — Chronic Care Management (AMB) (Signed)
    Chronic Care Management Pharmacy Assistant   Name: Madison Franklin  MRN: [id number] DOB: 1946-06-29  Reason for Encounter: Hypertension Disease State    Recent office visits:  09/24/22:[name], NP (PCP) Seen for hypertension. Labs ordered. Flu vaccine given in the office. Follow up in 6 months.   Recent consult visits:  08/25/22-Carmen Veda Canning, MD (Pulmonology) Seen for referred for evaluation of exertional dyspnea. Trial of Breztri 2 puffs twice a day. Patient liter flow increased to 3 L/min Patient was able to maintain saturations at 95%. Follow up in 3 months.   Hospital visits:  None in previous 6 months  Medications: Outpatient Encounter Medications as of 10/06/2022  Medication Sig   acetaminophen (TYLENOL) 325 MG tablet Take 325 mg by mouth every 6 (six) hours as needed (for pain.).   albuterol (VENTOLIN HFA) 108 (90 Base) MCG/ACT inhaler USE 2 INHALATIONS BY MOUTH EVERY 6 HOURS AS NEEDED FOR WHEEZING  OR SHORTNESS OF BREATH   amLODipine (NORVASC) 5 MG tablet Take 1 tablet (5 mg total) by mouth daily.   aspirin EC 81 MG tablet Take 81 mg by mouth daily.   atorvastatin (LIPITOR) 20 MG tablet Take 1 tablet (20 mg total) by mouth daily.   Budeson-Glycopyrrol-Formoterol (BREZTRI AEROSPHERE) 160-9-4.8 MCG/ACT AERO Inhale 2 puffs into the lungs in the morning and at bedtime.   hydrochlorothiazide (MICROZIDE) 12.5 MG capsule Take 1 capsule (12.5 mg total) by mouth daily.   lisinopril (ZESTRIL) 40 MG tablet Take 1 tablet (40 mg total) by mouth daily.   OXYGEN Inhale into the lungs. Patient is on 3 liters   vitamin B-12 (CYANOCOBALAMIN) 1000 MCG tablet Take 1,000 mcg by mouth daily.   Vitamin D, Cholecalciferol, 1000 units TABS Take 1 tablet by mouth daily.   No facility-administered encounter medications on file as of 10/06/2022.   Current antihypertensive regimen:  Amlodipine 5 mg once daily Lisinopril 40 mg once daily HCTZ 12.5 mg one capsule daily  Unsuccessful  attempts to complete assessment call. I have called patient 3x and left 3 voicemail's for the patient to return my call when available.   Adherence Review: Is the patient currently on ACE/ARB medication? Yes Does the patient have >5 day gap between last estimated fill dates? No   Care Gaps: None noted  Star Rating Drugs: Atorvastatin 20 mg Last filled:07/16/22 100 DS, 04/12/22 100 DS Lisinopril 40 mg Last filled:09/22/22 100 DS, 06/19/22 100 DS   Corrie Mckusick, [place]

## 2022-10-10 DIAGNOSIS — J449 Chronic obstructive pulmonary disease, unspecified: Secondary | ICD-10-CM | POA: Diagnosis not present

## 2022-10-14 DIAGNOSIS — I10 Essential (primary) hypertension: Secondary | ICD-10-CM | POA: Diagnosis not present

## 2022-10-14 DIAGNOSIS — D631 Anemia in chronic kidney disease: Secondary | ICD-10-CM | POA: Diagnosis not present

## 2022-10-14 DIAGNOSIS — N2581 Secondary hyperparathyroidism of renal origin: Secondary | ICD-10-CM | POA: Diagnosis not present

## 2022-10-14 DIAGNOSIS — N1832 Chronic kidney disease, stage 3b: Secondary | ICD-10-CM | POA: Diagnosis not present

## 2022-10-31 ENCOUNTER — Ambulatory Visit (INDEPENDENT_AMBULATORY_CARE_PROVIDER_SITE_OTHER): Payer: Medicare Other | Admitting: Nurse Practitioner

## 2022-10-31 ENCOUNTER — Encounter: Payer: Self-pay | Admitting: Nurse Practitioner

## 2022-10-31 VITALS — BP 162/63 | HR 82 | Temp 97.8°F | Wt 160.8 lb

## 2022-10-31 DIAGNOSIS — D229 Melanocytic nevi, unspecified: Secondary | ICD-10-CM

## 2022-10-31 DIAGNOSIS — G8929 Other chronic pain: Secondary | ICD-10-CM

## 2022-10-31 DIAGNOSIS — M546 Pain in thoracic spine: Secondary | ICD-10-CM

## 2022-10-31 NOTE — Progress Notes (Signed)
BP (!) 162/63 (BP Location: Left Arm, Cuff Size: Normal)   Pulse 82   Temp 97.8 F (36.6 C) (Oral)   Wt 160 lb 12.8 oz (72.9 kg)   LMP  (LMP Unknown)   SpO2 90%   BMI 30.40 kg/m    Subjective:    Patient ID: Madison Franklin, female    DOB: 04-Nov-1945, 77 y.o.   MRN: [id number]  HPI: Madison Franklin is a 77 y.o. female  Chief Complaint  Patient presents with   Shoulder Pain    Pt states she has been having R shoulder pain for the last 4 days. Describes the pain as a constant aching pain    SHOULDER PAIN Duration:  4 days Involved shoulder: right Mechanism of injury: unknown Location:  under her shoulder blade Onset:sudden Severity: 7/10  Quality:  aching Frequency: constant Radiation: no Aggravating factors:  nothing   Alleviating factors:  tylenol helped a little bit  and bengay Status: stable Treatments attempted:  tylenol and bengay   Relief with NSAIDs?:  No NSAIDs Taken Weakness: no Numbness: no Decreased grip strength: no Redness: no Swelling: no Bruising: no Fevers: no  Relevant past medical, surgical, family and social history reviewed and updated as indicated. Interim medical history since our last visit reviewed. Allergies and medications reviewed and updated.  Review of Systems  Musculoskeletal:        Right shoulder pain  Skin:        Mole on back    Per HPI unless specifically indicated above     Objective:    BP (!) 162/63 (BP Location: Left Arm, Cuff Size: Normal)   Pulse 82   Temp 97.8 F (36.6 C) (Oral)   Wt 160 lb 12.8 oz (72.9 kg)   LMP  (LMP Unknown)   SpO2 90%   BMI 30.40 kg/m   Wt Readings from Last 3 Encounters:  10/31/22 160 lb 12.8 oz (72.9 kg)  09/24/22 161 lb 9.6 oz (73.3 kg)  08/25/22 159 lb (72.1 kg)    Physical Exam Vitals and nursing note reviewed.  Constitutional:      General: She is not in acute distress.    Appearance: Normal appearance. She is normal weight. She is not ill-appearing, toxic-appearing or  diaphoretic.  HENT:     Head: Normocephalic.     Right Ear: External ear normal.     Left Ear: External ear normal.     Nose: Nose normal.     Mouth/Throat:     Mouth: Mucous membranes are moist.     Pharynx: Oropharynx is clear.  Eyes:     General:        Right eye: No discharge.        Left eye: No discharge.     Extraocular Movements: Extraocular movements intact.     Conjunctiva/sclera: Conjunctivae normal.     Pupils: Pupils are equal, round, and reactive to light.  Cardiovascular:     Rate and Rhythm: Normal rate and regular rhythm.     Heart sounds: No murmur heard. Pulmonary:     Effort: Pulmonary effort is normal. No respiratory distress.     Breath sounds: Normal breath sounds. No wheezing or rales.  Musculoskeletal:     Cervical back: Normal range of motion and neck supple.     Thoracic back: Spasms and tenderness present. Scoliosis present.  Skin:    General: Skin is warm and dry.     Capillary Refill: Capillary refill takes  less than 2 seconds.       Neurological:     General: No focal deficit present.     Mental Status: She is alert and oriented to person, place, and time. Mental status is at baseline.  Psychiatric:        Mood and Affect: Mood normal.        Behavior: Behavior normal.        Thought Content: Thought content normal.        Judgment: Judgment normal.     Results for orders placed or performed in visit on 09/24/22  CBC with Differential/Platelet  Result Value Ref Range   WBC 8.4 3.4 - 10.8 x10E3/uL   RBC 4.37 3.77 - 5.28 x10E6/uL   Hemoglobin 11.1 11.1 - 15.9 g/dL   Hematocrit 37.0 34.0 - 46.6 %   MCV 85 79 - 97 fL   MCH 25.4 (L) 26.6 - 33.0 pg   MCHC 30.0 (L) 31.5 - 35.7 g/dL   RDW 13.6 11.7 - 15.4 %   Platelets 169 150 - 450 x10E3/uL   Neutrophils 77 Not Estab. %   Lymphs 12 Not Estab. %   Monocytes 7 Not Estab. %   Eos 3 Not Estab. %   Basos 1 Not Estab. %   Neutrophils Absolute 6.4 1.4 - 7.0 x10E3/uL   Lymphocytes Absolute  1.0 0.7 - 3.1 x10E3/uL   Monocytes Absolute 0.6 0.1 - 0.9 x10E3/uL   EOS (ABSOLUTE) 0.2 0.0 - 0.4 x10E3/uL   Basophils Absolute 0.1 0.0 - 0.2 x10E3/uL   Immature Granulocytes 0 Not Estab. %   Immature Grans (Abs) 0.0 0.0 - 0.1 x10E3/uL  Comprehensive metabolic panel  Result Value Ref Range   Glucose 125 (H) 70 - 99 mg/dL   BUN 38 (H) 8 - 27 mg/dL   Creatinine, Ser 1.64 (H) 0.57 - 1.00 mg/dL   eGFR 32 (L) >59 mL/min/1.73   BUN/Creatinine Ratio 23 12 - 28   Sodium 138 134 - 144 mmol/L   Potassium 5.3 (H) 3.5 - 5.2 mmol/L   Chloride 106 96 - 106 mmol/L   CO2 18 (L) 20 - 29 mmol/L   Calcium 9.8 8.7 - 10.3 mg/dL   Total Protein 7.2 6.0 - 8.5 g/dL   Albumin 4.6 3.8 - 4.8 g/dL   Globulin, Total 2.6 1.5 - 4.5 g/dL   Albumin/Globulin Ratio 1.8 1.2 - 2.2   Bilirubin Total 0.3 0.0 - 1.2 mg/dL   Alkaline Phosphatase 105 44 - 121 IU/L   AST 12 0 - 40 IU/L   ALT 6 0 - 32 IU/L  Lipid Panel w/o Chol/HDL Ratio  Result Value Ref Range   Cholesterol, Total 180 100 - 199 mg/dL   Triglycerides 176 (H) 0 - 149 mg/dL   HDL 54 >39 mg/dL   VLDL Cholesterol Cal 30 5 - 40 mg/dL   LDL Chol Calc (NIH) 96 0 - 99 mg/dL  HgB A1c  Result Value Ref Range   Hgb A1c MFr Bld 5.3 4.8 - 5.6 %   Est. average glucose Bld gHb Est-mCnc 105 mg/dL  VITAMIN D 25 Hydroxy (Vit-D Deficiency, Fractures)  Result Value Ref Range   Vit D, 25-Hydroxy 40.2 30.0 - 100.0 ng/mL      Assessment & Plan:   Problem List Items Addressed This Visit   None Visit Diagnoses     Chronic right-sided thoracic back pain    -  Primary   Referral placed for patient to see physical therapy. Pain  likely due to muscle spasm on the left side.  Continue with tylenol, bengay and recommend  heating pad   Relevant Orders   Ambulatory referral to Physical Therapy   Atypical mole       Relevant Orders   Ambulatory referral to Dermatology        Follow up plan: Return if symptoms worsen or fail to improve.

## 2022-11-05 DIAGNOSIS — J449 Chronic obstructive pulmonary disease, unspecified: Secondary | ICD-10-CM | POA: Diagnosis not present

## 2022-11-10 DIAGNOSIS — J449 Chronic obstructive pulmonary disease, unspecified: Secondary | ICD-10-CM | POA: Diagnosis not present

## 2022-11-19 ENCOUNTER — Ambulatory Visit: Payer: Medicare Other | Admitting: Pulmonary Disease

## 2022-11-24 ENCOUNTER — Inpatient Hospital Stay: Payer: Medicare Other | Attending: Oncology

## 2022-11-24 DIAGNOSIS — I129 Hypertensive chronic kidney disease with stage 1 through stage 4 chronic kidney disease, or unspecified chronic kidney disease: Secondary | ICD-10-CM | POA: Diagnosis not present

## 2022-11-24 DIAGNOSIS — N183 Chronic kidney disease, stage 3 unspecified: Secondary | ICD-10-CM | POA: Diagnosis not present

## 2022-11-24 DIAGNOSIS — D631 Anemia in chronic kidney disease: Secondary | ICD-10-CM | POA: Insufficient documentation

## 2022-11-24 LAB — CBC WITH DIFFERENTIAL/PLATELET
Abs Immature Granulocytes: 0.04 10*3/uL (ref 0.00–0.07)
Basophils Absolute: 0.1 10*3/uL (ref 0.0–0.1)
Basophils Relative: 1 %
Eosinophils Absolute: 0.2 10*3/uL (ref 0.0–0.5)
Eosinophils Relative: 2 %
HCT: 38.7 % (ref 36.0–46.0)
Hemoglobin: 11.6 g/dL — ABNORMAL LOW (ref 12.0–15.0)
Immature Granulocytes: 1 %
Lymphocytes Relative: 15 %
Lymphs Abs: 1.3 10*3/uL (ref 0.7–4.0)
MCH: 25.6 pg — ABNORMAL LOW (ref 26.0–34.0)
MCHC: 30 g/dL (ref 30.0–36.0)
MCV: 85.4 fL (ref 80.0–100.0)
Monocytes Absolute: 0.6 10*3/uL (ref 0.1–1.0)
Monocytes Relative: 7 %
Neutro Abs: 6.5 10*3/uL (ref 1.7–7.7)
Neutrophils Relative %: 74 %
Platelets: 168 10*3/uL (ref 150–400)
RBC: 4.53 MIL/uL (ref 3.87–5.11)
RDW: 14.3 % (ref 11.5–15.5)
WBC: 8.7 10*3/uL (ref 4.0–10.5)
nRBC: 0 % (ref 0.0–0.2)

## 2022-11-24 LAB — IRON AND TIBC
Iron: 48 ug/dL (ref 28–170)
Saturation Ratios: 15 % (ref 10.4–31.8)
TIBC: 321 ug/dL (ref 250–450)
UIBC: 273 ug/dL

## 2022-11-24 LAB — FERRITIN: Ferritin: 35 ng/mL (ref 11–307)

## 2022-11-25 ENCOUNTER — Telehealth: Payer: Self-pay | Admitting: Nurse Practitioner

## 2022-11-25 NOTE — Telephone Encounter (Signed)
Copied from [place] [id number]. Topic: Medicare AWV >> Nov 25, 2022 11:33 AM Devoria Glassing wrote: Reason for CRM: Left message for patient to schedule Annual Wellness Visit.  Please schedule with Health Nurse Advisor Kirke Shaggy at [facility].Call [facility] at [phone]

## 2022-11-28 ENCOUNTER — Encounter: Payer: Self-pay | Admitting: Nurse Practitioner

## 2022-12-06 DIAGNOSIS — J449 Chronic obstructive pulmonary disease, unspecified: Secondary | ICD-10-CM | POA: Diagnosis not present

## 2022-12-11 DIAGNOSIS — J449 Chronic obstructive pulmonary disease, unspecified: Secondary | ICD-10-CM | POA: Diagnosis not present

## 2022-12-11 DIAGNOSIS — G4733 Obstructive sleep apnea (adult) (pediatric): Secondary | ICD-10-CM | POA: Diagnosis not present

## 2022-12-22 ENCOUNTER — Encounter: Payer: Self-pay | Admitting: Pulmonary Disease

## 2022-12-22 ENCOUNTER — Ambulatory Visit (INDEPENDENT_AMBULATORY_CARE_PROVIDER_SITE_OTHER): Payer: Medicare Other | Admitting: Pulmonary Disease

## 2022-12-22 VITALS — BP 122/74 | HR 80 | Temp 97.6°F | Ht 60.98 in | Wt 160.0 lb

## 2022-12-22 DIAGNOSIS — J9611 Chronic respiratory failure with hypoxia: Secondary | ICD-10-CM

## 2022-12-22 DIAGNOSIS — G4733 Obstructive sleep apnea (adult) (pediatric): Secondary | ICD-10-CM | POA: Diagnosis not present

## 2022-12-22 DIAGNOSIS — R942 Abnormal results of pulmonary function studies: Secondary | ICD-10-CM | POA: Diagnosis not present

## 2022-12-22 DIAGNOSIS — I272 Pulmonary hypertension, unspecified: Secondary | ICD-10-CM | POA: Diagnosis not present

## 2022-12-22 DIAGNOSIS — J449 Chronic obstructive pulmonary disease, unspecified: Secondary | ICD-10-CM

## 2022-12-22 MED ORDER — BREZTRI AEROSPHERE 160-9-4.8 MCG/ACT IN AERO: 2.0000 | INHALATION_SPRAY | Freq: Two times a day (BID) | RESPIRATORY_TRACT | 0 refills | Status: DC

## 2022-12-22 NOTE — Progress Notes (Signed)
Subjective:    Patient ID: Madison Franklin, female    DOB: [birth year], 77 y.o.   MRN: [id number] Patient Care Team: Venita Lick, NP as PCP - General (Nurse Practitioner) [name], MD as Consulting Physician (Gastroenterology) Anthonette Legato, MD (Internal Medicine) Jules Husbands, MD as Consulting Physician (General Surgery) Madelin Rear, [facility] (Inactive) (Pharmacist) Tyler Pita, MD as Consulting Physician (Pulmonary Disease) [name], MD as Consulting Physician (Oncology)  Chief Complaint  Patient presents with   Follow-up    Severe pulmonary hypertension. No SOB, wheezing or cough.   Requesting MD/Service: [name], NP.  Current primary provider Marnee Guarneri, NP Date of initial consultation: 05/11/17 by Dr. Merton Border Reason for consultation: Former smoker, exertional dyspnea   PT PROFILE: Madison Franklin is a former smoker (approx 15 P-Y history) initially referred for evaluation of exertional dyspnea   PROBLEMS: Former smoker Severe thoracic kyphoscoliosis SEVERE restrictive pulmonary physiology (chest wall) SEVERE pulmonary hypertension     DATA: CT chest 03/13/17: Severe thoracic kyphoscoliosis. 4 mm nodule in left upper lobe (decreased in size from previously). Minimal emphysema Echocardiogram 05/28/17: LVEF 60-65%.  Grade 1 diastolic dysfunction.  No significant valvular disease noted.  No evidence of right-sided overload. [place] 07/14/17: Mild to moderate pulmonary hypertension (mean PAP 34 mmHg, PVR 5.7 [place] units) PFTs 10/01/17: FVC: 1.28 L (49 %pred), FEV1: 0.83 > 0.91 L (41 > 45 %pred), FEV1/FVC: 65%; lung volume measurements invalid, DLCO 6.6 (31 %pred) ONO 10/26/17:  Minimal brief desaturation. Does not require nocturnal O2 LDCT chest 06/01/18: Lung-RADS 2, benign appearance or behavior. Aortic atherosclerosis, coronary artery atherosclerosis and emphysema. Pulmonary artery enlargement suggests pulmonary arterial hypertension. Gallbladder wall  calcifications indicative of "porcelain gallbladder". Very large thyroid gland extending into upper mediastinum. 2D echo 06/12/2020: LVEF 60 to 65%.  Grade 1 diastolic dysfunction.  Elevated left atrial pressure.  Increased size of right ventricle.  Severely elevated pulmonary artery systolic pressure.  Estimated right ventricular systolic pressure 0000000 mmHg. PFTs 06/12/2020: Severe restriction, element of obstruction also evident by flow volume loop.  Diffusion capacity impaired moderately. Sleep study 07/16/2020: REM related AHI of 77.1, low O2 sats at 65%.  Severe sleep apnea with associated hypoventilation due to severe scoliosis. Echocardiogram 03/28/2022: LVEF 60 to 65%, mild LVH, grade 1 DD, severely elevated pulmonary artery systolic pressure estimated 68.3 mmHg right ventricular systolic pressure relatively unchanged from prior   INTERVAL: Last seen 25 August 2022 by me.  She was given a trial of Breztri 2 puffs twice a day finds this beneficial.  Using CPAP at 7 cm H2O compliant.  No complaints voiced.    HPI Madison Franklin presents for follow-up today.  Recall she has very severe restrictive physiology due to severe thoracic scoliosis and restrictive and obstructive sleep apnea.  There is also an element of underlying COPD that cannot be quantitated accurately due to her restrictive physiology.  She has been using CPAP at 7 cm water pressure for severe sleep apnea.  Uses oxygen at 3 L/min continuous for chronic respiratory failure with hypoxia.  She has been doing well using oxygen 24/7.  Tolerating the portable concentrator well.  She is compliant with her oxygen therapy.  She does not endorse any fevers, chills or sweats.    Since starting oxygen therapy, and CPAP, she sleeps well.  No dyspnea as long as she is using her oxygen.  Voices no active complaint today.   At her prior visit we started her on Breztri 2 puffs twice  a day and she notes that this medication helps her.  She does not endorse  any symptomatology today.  CPAP compliance download: Patient has 100% usage over 4 hours x 30 days.  AHI reduced to 0.7.  Absolutely no breaks in therapy.    Review of Systems A 10 point review of systems was performed and it is as noted above otherwise negative.  Patient Active Problem List   Diagnosis Date Noted   Hearing aid worn 03/20/2022   Mold exposure 03/20/2021   OSA (obstructive sleep apnea) 03/20/2021   IFG (impaired fasting glucose) 03/20/2021   Aortic atherosclerosis ([place]) 07/29/2020   Chronic respiratory failure with hypoxia ([place]) 07/29/2020   severe restrictive pulmonary physiology due to severe kyphoscoliosis 07/04/2020   Porcelain gallbladder 11/30/2019   Severe pulmonary hypertension ([place]) 10/02/2019   Intestinal metaplasia of gastric mucosa    Chronic atrophic gastritis without bleeding 12/15/2018   B12 deficiency 08/04/2018   Nodule of upper lobe of left lung 05/26/2018   Anemia in chronic kidney disease 05/26/2018   Coronary artery calcification 06/18/2017   Heart murmur 04/13/2017   Chronic kidney disease, stage 3 ([place]) 04/13/2017   Advanced care planning/counseling discussion 01/05/2017   Hx of colonic polyps    Benign neoplasm of cecum    Osteopenia 07/26/2015   COPD, severe ([place]) 07/26/2015   Essential hypertension 07/26/2015   Hyperlipidemia 07/26/2015   Social History   Tobacco Use   Smoking status: Former    Packs/day: 0.50    Years: 30.00    Total pack years: 15.00    Types: Cigarettes    Quit date: 01/17/2017    Years since quitting: 5.9   Smokeless tobacco: Never  Substance Use Topics   Alcohol use: No    Alcohol/week: 0.0 standard drinks of alcohol   Allergies  Allergen Reactions   Latex Itching and Rash    With latex gloves (when worn)   Current Meds  Medication Sig   acetaminophen (TYLENOL) 325 MG tablet Take 325 mg by mouth every 6 (six) hours as needed (for pain.).   albuterol (VENTOLIN HFA) 108 (90 Base) MCG/ACT inhaler USE  2 INHALATIONS BY MOUTH EVERY 6 HOURS AS NEEDED FOR WHEEZING  OR SHORTNESS OF BREATH   amLODipine (NORVASC) 5 MG tablet Take 1 tablet (5 mg total) by mouth daily.   aspirin EC 81 MG tablet Take 81 mg by mouth daily.   atorvastatin (LIPITOR) 20 MG tablet Take 1 tablet (20 mg total) by mouth daily.   Budeson-Glycopyrrol-Formoterol (BREZTRI AEROSPHERE) 160-9-4.8 MCG/ACT AERO Inhale 2 puffs into the lungs in the morning and at bedtime.   hydrochlorothiazide (MICROZIDE) 12.5 MG capsule Take 1 capsule (12.5 mg total) by mouth daily.   lisinopril (ZESTRIL) 40 MG tablet Take 1 tablet (40 mg total) by mouth daily.   OXYGEN Inhale into the lungs. Patient is on 3 liters   vitamin B-12 (CYANOCOBALAMIN) 1000 MCG tablet Take 1,000 mcg by mouth daily.   Vitamin D, Cholecalciferol, 1000 units TABS Take 1 tablet by mouth daily.   Immunization History  Administered Date(s) Administered   Fluad Quad(high Dose 65+) 08/03/2020, 09/19/2021, 09/24/2022   Influenza, High Dose Seasonal PF 09/03/2016   Influenza,inj,Quad PF,6+ Mos 07/27/2015   Influenza,inj,quad, With Preservative 08/24/2018, 07/15/2019   Influenza-Unspecified 08/14/2017, 08/24/2018   Moderna Sars-Covid-2 Vaccination 10/18/2020   PFIZER(Purple Top)SARS-COV-2 Vaccination 12/15/2019, 01/11/2020   Pneumococcal Conjugate-13 01/12/2015   Pneumococcal Polysaccharide-23 07/01/2011   Td 07/27/2015       Objective:  Physical Exam BP 122/74 (BP Location: Left Arm, Cuff Size: Normal)   Pulse 80   Temp 97.6 F (36.4 C)   Ht 5' 0.98" (1.549 m)   Wt 160 lb (72.6 kg) Comment: las recorded weight. patient in a wheelchair  LMP  (LMP Unknown)   SpO2 92%   BMI 30.25 kg/m   SpO2: 92 % O2 Device: Nasal cannula O2 Flow Rate (L/min): 3 L/min O2 Type: Pulse O2  GENERAL: Awake alert, no distress.  Presents in transport chair, comfortable on oxygen via POC.   HEAD: Normocephalic, atraumatic.  Wears hearing aids. EYES: Pupils equal, round, reactive to  light.  No scleral icterus.  MOUTH: Very poor dentition, oral mucosa moist. NECK: Supple.  Thyroid diffusely enlarged.  Deviated to the right. No JVD.  No adenopathy. PULMONARY: Distant breath sounds, coarse, no other adventitious sounds. CARDIOVASCULAR: S1 and S2. Regular rate and rhythm.  Loud holosystolic murmur grade 4/6 best heard at the left sternal border  ABDOMEN: Soft, nondistended, nontender. MUSCULOSKELETAL: Very severe kyphoscoliosis. NEUROLOGIC: Very hard of hearing, no focal deficits. SKIN: Intact,warm,dry.  No overt rashes noted. PSYCH: Flat affect, normal behavior       Assessment & Plan:     ICD-10-CM   1. Severe pulmonary hypertension (HCC)  I27.20    Multifactorial: Chronic hypoxic vasoconstriction Restrictive physiology/COPD/OSA    2. Chronic obstructive pulmonary disease, unspecified COPD type ([place])  J44.9    Severity underestimated due to restriction Continue Breztri 2 puffs twice a day Continue as needed albuterol    3. Severe restrictive pulmonary physiology due to severe kyphoscoliosis  R94.2    Continue supportive measures    4. Chronic respiratory failure with hypoxia (HCC)  J96.11    Continue oxygen at 3 L/min    5. OSA (obstructive sleep apnea)  G47.33    Well compensated on CPAP 7 cmH2O Continue CPAP     Meds ordered this encounter  Medications   Budeson-Glycopyrrol-Formoterol (BREZTRI AEROSPHERE) 160-9-4.8 MCG/ACT AERO    Sig: Inhale 2 puffs into the lungs in the morning and at bedtime.    Dispense:  11.8 g    Refill:  0    Order Specific Question:   Lot Number?    Answer:   [id number]    Order Specific Question:   Expiration Date?    Answer:   05/20/2025    Order Specific Question:   Manufacturer?    Answer:   [employer] [[id number]]    Order Specific Question:   Quantity    Answer:   2   Patient is to continue her current regimen.  We will see her in follow-up in 4 months time call sooner should any new problems arise.  Renold Don,  MD Advanced Bronchoscopy PCCM [facility]    *This note was dictated using voice recognition software/Dragon.  Despite best efforts to proofread, errors can occur which can change the meaning. Any transcriptional errors that result from this process are unintentional and may not be fully corrected at the time of dictation.

## 2022-12-22 NOTE — Patient Instructions (Signed)
Continue using your oxygen as you are doing.  Continue your [employer].  We provided you with a few samples today.  We will see you in follow-up in 4 months time call sooner should any new problems arise.

## 2022-12-29 ENCOUNTER — Other Ambulatory Visit: Payer: Self-pay | Admitting: Nurse Practitioner

## 2022-12-29 ENCOUNTER — Ambulatory Visit: Payer: Medicare Other

## 2022-12-29 ENCOUNTER — Telehealth: Payer: Self-pay

## 2022-12-29 MED ORDER — ALBUTEROL SULFATE HFA 108 (90 BASE) MCG/ACT IN AERS: INHALATION_SPRAY | RESPIRATORY_TRACT | 1 refills | Status: DC

## 2022-12-29 NOTE — Patient Outreach (Signed)
Care Management & Coordination Services Pharmacy Note  12/29/2022 Name:  Madison Franklin MRN:  [id number] DOB:  1946-06-06  Summary: -Patient's husband helps with phone calls since she can't "hear well"  Recommendations/Changes made from today's visit: -PAP for Everette Rank for Albuterol   Subjective: Madison Franklin is an 77 y.o. year old female who is a primary patient of Cannady, Barbaraann Faster, NP.  The care coordination team was consulted for assistance with disease management and care coordination needs.    Engaged with patient by telephone for initial visit.  Recent office visits:  09/24/22:[name], NP (PCP) Seen for hypertension. Labs ordered. Flu vaccine given in the office. Follow up in 6 months.    Recent consult visits:  08/25/22-Carmen Veda Canning, MD (Pulmonology) Seen for referred for evaluation of exertional dyspnea. Trial of Breztri 2 puffs twice a day. Patient liter flow increased to 3 L/min Patient was able to maintain saturations at 95%. Follow up in 3 months.    Hospital visits:  None in previous 6 months   Objective:  Lab Results  Component Value Date   CREATININE 1.64 (H) 09/24/2022   BUN 38 (H) 09/24/2022   EGFR 32 (L) 09/24/2022   GFRNONAA 37 (L) 07/16/2022   GFRAA 41 (L) 05/29/2020   NA 138 09/24/2022   K 5.3 (H) 09/24/2022   CALCIUM 9.8 09/24/2022   CO2 18 (L) 09/24/2022   GLUCOSE 125 (H) 09/24/2022    Lab Results  Component Value Date/Time   HGBA1C 5.3 09/24/2022 02:29 PM   HGBA1C 4.8 09/19/2021 10:13 AM   HGBA1C 5.2 03/20/2021 03:23 PM   MICROALBUR 10 09/19/2021 10:13 AM   MICROALBUR 30 (H) 03/20/2021 03:23 PM    Last diabetic Eye exam: No results found for: "HMDIABEYEEXA"  Last diabetic Foot exam: No results found for: "HMDIABFOOTEX"   Lab Results  Component Value Date   CHOL 180 09/24/2022   HDL 54 09/24/2022   LDLCALC 96 09/24/2022   TRIG 176 (H) 09/24/2022       Latest Ref Rng & Units 09/24/2022    2:29 PM 07/16/2022     8:51 AM 03/20/2021    3:29 PM  Hepatic Function  Total Protein 6.0 - 8.5 g/dL 7.2  7.8  7.3   Albumin 3.8 - 4.8 g/dL 4.6  4.3  4.6   AST 0 - 40 IU/L '12  18  15   '$ ALT 0 - 32 IU/L '6  9  7   '$ Alk Phosphatase 44 - 121 IU/L 105  91  101   Total Bilirubin 0.0 - 1.2 mg/dL 0.3  0.7  0.4     Lab Results  Component Value Date/Time   TSH 0.536 03/20/2022 08:37 AM   TSH 0.623 03/20/2021 03:29 PM       Latest Ref Rng & Units 11/24/2022    2:02 PM 09/24/2022    2:29 PM 07/16/2022    8:51 AM  CBC  WBC 4.0 - 10.5 K/uL 8.7  8.4  9.5   Hemoglobin 12.0 - 15.0 g/dL 11.6  11.1  12.5   Hematocrit 36.0 - 46.0 % 38.7  37.0  41.0   Platelets 150 - 400 K/uL 168  169  156     Lab Results  Component Value Date/Time   VD25OH 40.2 09/24/2022 02:29 PM   VD25OH 45.9 09/19/2021 10:16 AM   VITAMINB12 2,152 (H) 10/03/2021 10:47 AM   VITAMINB12 >2000 (H) 09/19/2021 10:16 AM    Clinical ASCVD: Yes  The 10-year ASCVD risk score (Arnett DK, et al., 2019) is: 15%   Values used to calculate the score:     Age: 43 years     Sex: Female     Is Non-Hispanic African American: Yes     Diabetic: No     Tobacco smoker: No     Systolic Blood Pressure: 123XX123 mmHg     Is BP treated: Yes     HDL Cholesterol: 54 mg/dL     Total Cholesterol: 180 mg/dL    Other: (CHADS2VASc if Afib, MMRC or CAT for COPD, ACT, DEXA)     09/24/2022    1:34 PM 03/20/2022    8:19 AM 10/28/2021    9:21 AM  Depression screen PHQ 2/9  Decreased Interest 0 0 0  Down, Depressed, Hopeless 0 0 0  PHQ - 2 Score 0 0 0  Altered sleeping 0 0   Tired, decreased energy 1 2   Change in appetite 0 0   Feeling bad or failure about yourself  0 0   Trouble concentrating 0 0   Moving slowly or fidgety/restless 0 0   Suicidal thoughts 0 0   PHQ-9 Score 1 2      Social History   Tobacco Use  Smoking Status Former   Packs/day: 0.50   Years: 30.00   Total pack years: 15.00   Types: Cigarettes   Quit date: 01/17/2017   Years since quitting: 5.9   Smokeless Tobacco Never   BP Readings from Last 3 Encounters:  12/22/22 122/74  10/31/22 (!) 162/63  09/24/22 128/62   Pulse Readings from Last 3 Encounters:  12/22/22 80  10/31/22 82  09/24/22 84   Wt Readings from Last 3 Encounters:  12/22/22 160 lb (72.6 kg)  10/31/22 160 lb 12.8 oz (72.9 kg)  09/24/22 161 lb 9.6 oz (73.3 kg)   BMI Readings from Last 3 Encounters:  12/22/22 30.25 kg/m  10/31/22 30.40 kg/m  09/24/22 30.55 kg/m    Allergies  Allergen Reactions   Latex Itching and Rash    With latex gloves (when worn)    Medications Reviewed Today     Reviewed by Tyler Pita, MD (Physician) on 12/22/22 at [place] List Status: <None>   Medication Order Taking? Sig Documenting Provider Last Dose Status Informant  acetaminophen (TYLENOL) 325 MG tablet [id number] Yes Take 325 mg by mouth every 6 (six) hours as needed (for pain.). [provider] Taking Active Self  albuterol (VENTOLIN HFA) 108 (90 Base) MCG/ACT inhaler [id number] Yes USE 2 INHALATIONS BY MOUTH EVERY 6 HOURS AS NEEDED FOR WHEEZING  OR SHORTNESS OF BREATH Cannady, Jolene T, NP Taking Active   amLODipine (NORVASC) 5 MG tablet [id number] Yes Take 1 tablet (5 mg total) by mouth daily. Marnee Guarneri T, NP Taking Active   aspirin EC 81 MG tablet [id number] Yes Take 81 mg by mouth daily. [provider] Taking Active Self  atorvastatin (LIPITOR) 20 MG tablet [id number] Yes Take 1 tablet (20 mg total) by mouth daily. Marnee Guarneri T, NP Taking Active   Budeson-Glycopyrrol-Formoterol (BREZTRI AEROSPHERE) 160-9-4.8 MCG/ACT AERO [id number] Yes Inhale 2 puffs into the lungs in the morning and at bedtime. Marnee Guarneri T, NP Taking Active   Budeson-Glycopyrrol-Formoterol (BREZTRI AEROSPHERE) 160-9-4.8 MCG/ACT AERO [id number] Yes Inhale 2 puffs into the lungs in the morning and at bedtime. Tyler Pita, MD  Active   hydrochlorothiazide (MICROZIDE) 12.5 MG capsule [id number] Yes Take 1 capsule  (12.5 mg total)  by mouth daily. Marnee Guarneri T, NP Taking Active   lisinopril (ZESTRIL) 40 MG tablet [id number] Yes Take 1 tablet (40 mg total) by mouth daily. Marnee Guarneri T, NP Taking Active   OXYGEN [id number] Yes Inhale into the lungs. Patient is on 3 liters [provider] Taking Active   vitamin B-12 (CYANOCOBALAMIN) 1000 MCG tablet [id number] Yes Take 1,000 mcg by mouth daily. [provider] Taking Active   Vitamin D, Cholecalciferol, 1000 units TABS [id number] Yes Take 1 tablet by mouth daily. [provider] Taking Active Self            SDOH:  (Social Determinants of Health) assessments and interventions performed: Yes SDOH Interventions    Flowsheet Row Care Coordination from 12/29/2022 in [facility] Management from 11/11/2021 in [place] from 10/28/2021 in [facility] Management from 07/02/2021 in [place] Management from 04/27/2020 in [place] Management from 01/25/2020 in [place]  SDOH Interventions        Food Insecurity Interventions -- Intervention Not Indicated Intervention Not Indicated -- -- --  Housing Interventions -- -- Intervention Not Indicated Intervention Not Indicated -- --  Transportation Interventions Intervention Not Indicated Intervention Not Indicated Intervention Not Indicated Intervention Not Indicated -- --  Financial Strain Interventions Other (Comment)  [PAP] -- Intervention Not Indicated Intervention Not Indicated Other (Comment)  [medication assistance] --  Physical Activity Interventions -- -- Intervention Not Indicated Other (Comments)  [No structured activity. Husband states she gets around but doesn't do a lot] -- Other (Comments)  [the patients husband says she does some activity but not alot]  Stress  Interventions -- -- Intervention Not Indicated Intervention Not Indicated -- --  Social Connections Interventions -- -- Intervention Not Indicated Intervention Not Indicated -- --       Medication Assistance:   Albuterol: Myles Rosenthal  Breztri: -2024: PAP Started March   Name and location of Current pharmacy:  [facility], [address] [place] [address] Phone: [phone] Fax: [phone]  [facility] ([place], [place] [facility] [address] [place] [address] Phone: [phone] Fax: [phone]  [place], [place] [address] Phone: [phone] Fax: [phone]    Compliance/Adherence/Medication fill history: Care Gaps: None noted   Star Rating Drugs: Atorvastatin 20 mg Last filled:07/16/22 100 DS, 04/12/22 100 DS Lisinopril 40 mg Last filled:09/22/22 100 DS, 06/19/22 100 DS   Assessment/Plan   Hypertension (BP goal <140/90) BP Readings from Last 3 Encounters:  12/22/22 122/74  10/31/22 (!) 162/63  09/24/22 128/62  -Controlled -Current treatment: Amlodipine '5mg'$  Appropriate, Effective, Safe, Accessible Lisinopril '40mg'$  Appropriate, Effective, Safe, Accessible -Medications previously tried: N/A  -Current home readings: Didn't have list -Current dietary habits: "Tries to eat healthy" -Current exercise habits: None -Denies hypotensive/hypertensive symptoms -Educated on BP goals and benefits of medications for prevention of heart attack, stroke and kidney damage; -Counseled to monitor BP at home daily, document, and provide log at future appointments -Recommended to continue current medication  Hyperlipidemia: (LDL goal < 100 The 10-year ASCVD risk score (Arnett DK, et al., 2019) is: 15%   Values used to calculate the score:  Age: 82 years     Sex: Female      Is Non-Hispanic African American: Yes     Diabetic: No     Tobacco smoker: No     Systolic Blood Pressure: 123XX123 mmHg     Is BP treated: Yes     HDL Cholesterol: 54 mg/dL     Total Cholesterol: 180 mg/dL Lab Results  Component Value Date   CHOL 180 09/24/2022   CHOL 172 03/20/2022   CHOL 187 09/19/2021   Lab Results  Component Value Date   HDL 54 09/24/2022   HDL 53 03/20/2022   HDL 55 09/19/2021   Lab Results  Component Value Date   LDLCALC 96 09/24/2022   LDLCALC 99 03/20/2022   LDLCALC 107 (H) 09/19/2021   Lab Results  Component Value Date   TRIG 176 (H) 09/24/2022   TRIG 110 03/20/2022   TRIG 142 09/19/2021   No results found for: "CHOLHDL" No results found for: "LDLDIRECT" Last vitamin D Lab Results  Component Value Date   VD25OH 40.2 09/24/2022   Lab Results  Component Value Date   TSH 0.536 03/20/2022   -Controlled -Current treatment: Atorvastatin '20mg'$  Appropriate, Effective, Safe, Accessible ASA '81mg'$  Appropriate, Effective, Safe, Accessible -Medications previously tried: N/A  -Current dietary patterns: "Tries to eat healthy" -Current exercise habits: None -Educated on Cholesterol goals;  -Recommended to continue current medication   COPD (Goal: control symptoms and prevent exacerbations) -Controlled -Current treatment  Albuterol Appropriate, Effective, Safe, Query accessible March 2024: GoodRx given Breztri Appropriate, Effective, Safe, Query accessible 2024: PAP started March -Medications previously tried: N/A  -MMRC/CAT score:     03/20/2021   12:12 PM  CAT Score  Total CAT Score 10  -Pulmonary function testing: Pulmonary Functions Testing Results: Pulmonary Functions Testing Results:  TLC  Date Value Ref Range Status  06/12/2020 2.97 L Final   PF Readings from Last 3 Encounters:  No data found for PF  -Patient denies consistent use of maintenance inhaler -Counseled on [place] inhaler technique; March 2024:  -GoodRx for Albuterol.  Counseled to always call me before refills -Breztri PAP ASAP  CPP F/U Novmber to re-do PAP  [place] Constable, Pharm.D. - [phone]

## 2022-12-29 NOTE — Progress Notes (Signed)
Care Management & Coordination Services Pharmacy Team  Reason for Encounter: Patient assistance    Patient will be enrolled in [place] patient assistance program for the medication Breztri. Application was prefilled on 12/29/22 and will be printed and mailed out to patient on 12/31/22.  Patient will then sig and date application and return to [place] for completion.  Madison Franklin

## 2023-01-04 DIAGNOSIS — J449 Chronic obstructive pulmonary disease, unspecified: Secondary | ICD-10-CM | POA: Diagnosis not present

## 2023-01-06 ENCOUNTER — Telehealth: Payer: Self-pay | Admitting: Nurse Practitioner

## 2023-01-06 NOTE — Telephone Encounter (Signed)
Contacted [name] to schedule their annual wellness visit. Call back at later date: 01/07/2023  Sherol Dade; [facility] Direct Dial: [phone]

## 2023-01-09 DIAGNOSIS — J449 Chronic obstructive pulmonary disease, unspecified: Secondary | ICD-10-CM | POA: Diagnosis not present

## 2023-01-22 ENCOUNTER — Other Ambulatory Visit: Payer: Self-pay | Admitting: Physician Assistant

## 2023-01-22 DIAGNOSIS — D631 Anemia in chronic kidney disease: Secondary | ICD-10-CM | POA: Insufficient documentation

## 2023-01-22 DIAGNOSIS — N183 Chronic kidney disease, stage 3 unspecified: Secondary | ICD-10-CM

## 2023-01-22 DIAGNOSIS — N189 Chronic kidney disease, unspecified: Secondary | ICD-10-CM | POA: Insufficient documentation

## 2023-01-30 ENCOUNTER — Telehealth: Payer: Self-pay

## 2023-01-30 NOTE — Progress Notes (Cosign Needed)
Care Management & Coordination Services Pharmacy Team  Reason for Encounter: Hypertension  Contacted patient to discuss hypertension disease state. Unsuccessful outreach. Left voicemail for patient to return call.   Recent office visits:  None noted  Recent consult visits:  None noted  Hospital visits:  None in previous 6 months  Medications: Outpatient Encounter Medications as of 01/30/2023  Medication Sig   acetaminophen (TYLENOL) 325 MG tablet Take 325 mg by mouth every 6 (six) hours as needed (for pain.).   albuterol (VENTOLIN HFA) 108 (90 Base) MCG/ACT inhaler USE 2 INHALATIONS BY MOUTH EVERY 6 HOURS AS NEEDED FOR WHEEZING  OR SHORTNESS OF BREATH   amLODipine (NORVASC) 5 MG tablet Take 1 tablet (5 mg total) by mouth daily.   aspirin EC 81 MG tablet Take 81 mg by mouth daily.   atorvastatin (LIPITOR) 20 MG tablet Take 1 tablet (20 mg total) by mouth daily.   Budeson-Glycopyrrol-Formoterol (BREZTRI AEROSPHERE) 160-9-4.8 MCG/ACT AERO Inhale 2 puffs into the lungs in the morning and at bedtime.   Budeson-Glycopyrrol-Formoterol (BREZTRI AEROSPHERE) 160-9-4.8 MCG/ACT AERO Inhale 2 puffs into the lungs in the morning and at bedtime.   hydrochlorothiazide (MICROZIDE) 12.5 MG capsule Take 1 capsule (12.5 mg total) by mouth daily.   lisinopril (ZESTRIL) 40 MG tablet Take 1 tablet (40 mg total) by mouth daily.   OXYGEN Inhale into the lungs. Patient is on 3 liters   vitamin B-12 (CYANOCOBALAMIN) 1000 MCG tablet Take 1,000 mcg by mouth daily.   Vitamin D, Cholecalciferol, 1000 units TABS Take 1 tablet by mouth daily.   No facility-administered encounter medications on file as of 01/30/2023.    Recent Office Vitals: BP Readings from Last 3 Encounters:  12/22/22 122/74  10/31/22 (!) 162/63  09/24/22 128/62   Pulse Readings from Last 3 Encounters:  12/22/22 80  10/31/22 82  09/24/22 84    Wt Readings from Last 3 Encounters:  12/22/22 160 lb (72.6 kg)  10/31/22 160 lb 12.8 oz  (72.9 kg)  09/24/22 161 lb 9.6 oz (73.3 kg)     Kidney Function Lab Results  Component Value Date/Time   CREATININE 1.64 (H) 09/24/2022 02:29 PM   CREATININE 1.46 (H) 07/16/2022 08:51 AM   GFRNONAA 37 (L) 07/16/2022 08:51 AM   GFRAA 41 (L) 05/29/2020 09:23 AM       Latest Ref Rng & Units 09/24/2022    2:29 PM 07/16/2022    8:51 AM 02/21/2022    2:47 PM  BMP  Glucose 70 - 99 mg/dL 161  096  045   BUN 8 - 27 mg/dL 38  32  37   Creatinine 0.57 - 1.00 mg/dL 4.09  8.11  9.14   BUN/Creat Ratio 12 - 28 23     Sodium 134 - 144 mmol/L 138  137  139   Potassium 3.5 - 5.2 mmol/L 5.3  4.1  4.4   Chloride 96 - 106 mmol/L 106  108  107   CO2 20 - 29 mmol/L 18  22  22    Calcium 8.7 - 10.3 mg/dL 9.8  9.6  9.5    Current antihypertensive regimen:  Amlodipine 5 mg take 1 tablet daily Hydrochlorothiazide 12,5 mg take 1 capsule daily Lisinopril 40 mg take 1 tablet daily   Adherence Review: Is the patient currently on ACE/ARB medication? No Does the patient have >5 day gap between last estimated fill dates? No  Star Rating Drugs:  Atorvastatin 20 mg Last filled:01/18/23 100 DS, 10/15/22 100 DS  Corrie Mckusick, RMA

## 2023-02-04 DIAGNOSIS — J449 Chronic obstructive pulmonary disease, unspecified: Secondary | ICD-10-CM | POA: Diagnosis not present

## 2023-02-09 DIAGNOSIS — J449 Chronic obstructive pulmonary disease, unspecified: Secondary | ICD-10-CM | POA: Diagnosis not present

## 2023-02-12 ENCOUNTER — Encounter: Payer: Self-pay | Admitting: Hematology and Oncology

## 2023-02-12 ENCOUNTER — Telehealth: Payer: Self-pay | Admitting: Nurse Practitioner

## 2023-02-12 NOTE — Telephone Encounter (Signed)
Contacted Madison Franklin to schedule their annual wellness visit. Appointment made for 02/23/2023.  [name]; Care Guide Ambulatory Clinical Support [place] [facility] Direct Dial: [phone]

## 2023-02-17 DIAGNOSIS — N1832 Chronic kidney disease, stage 3b: Secondary | ICD-10-CM | POA: Diagnosis not present

## 2023-02-17 DIAGNOSIS — I10 Essential (primary) hypertension: Secondary | ICD-10-CM | POA: Diagnosis not present

## 2023-02-17 DIAGNOSIS — D631 Anemia in chronic kidney disease: Secondary | ICD-10-CM | POA: Diagnosis not present

## 2023-02-17 DIAGNOSIS — N2581 Secondary hyperparathyroidism of renal origin: Secondary | ICD-10-CM | POA: Diagnosis not present

## 2023-02-24 ENCOUNTER — Telehealth: Payer: Self-pay

## 2023-02-24 NOTE — Progress Notes (Cosign Needed)
Care Management & Coordination Services Pharmacy Team  Reason for Encounter: Hypertension  Contacted patient to discuss hypertension disease state. Unsuccessful outreach. Left voicemail for patient to return call.   Recent office visits:  None noted  Recent consult visits:  02/17/23-[place] Cherylann Ratel, MD (Nephrology) Seen for follow up visit. Labs ordered. Follow up in 4 months.   Hospital visits:  None in previous 6 months  Medications: Outpatient Encounter Medications as of 02/24/2023  Medication Sig   acetaminophen (TYLENOL) 325 MG tablet Take 325 mg by mouth every 6 (six) hours as needed (for pain.).   albuterol (VENTOLIN HFA) 108 (90 Base) MCG/ACT inhaler USE 2 INHALATIONS BY MOUTH EVERY 6 HOURS AS NEEDED FOR WHEEZING  OR SHORTNESS OF BREATH   amLODipine (NORVASC) 5 MG tablet Take 1 tablet (5 mg total) by mouth daily.   aspirin EC 81 MG tablet Take 81 mg by mouth daily.   atorvastatin (LIPITOR) 20 MG tablet Take 1 tablet (20 mg total) by mouth daily.   Budeson-Glycopyrrol-Formoterol (BREZTRI AEROSPHERE) 160-9-4.8 MCG/ACT AERO Inhale 2 puffs into the lungs in the morning and at bedtime.   Budeson-Glycopyrrol-Formoterol (BREZTRI AEROSPHERE) 160-9-4.8 MCG/ACT AERO Inhale 2 puffs into the lungs in the morning and at bedtime.   hydrochlorothiazide (MICROZIDE) 12.5 MG capsule Take 1 capsule (12.5 mg total) by mouth daily.   lisinopril (ZESTRIL) 40 MG tablet Take 1 tablet (40 mg total) by mouth daily.   OXYGEN Inhale into the lungs. Patient is on 3 liters   vitamin B-12 (CYANOCOBALAMIN) 1000 MCG tablet Take 1,000 mcg by mouth daily.   Vitamin D, Cholecalciferol, 1000 units TABS Take 1 tablet by mouth daily.   No facility-administered encounter medications on file as of 02/24/2023.    Recent Office Vitals: BP Readings from Last 3 Encounters:  12/22/22 122/74  10/31/22 (!) 162/63  09/24/22 128/62   Pulse Readings from Last 3 Encounters:  12/22/22 80  10/31/22 82  09/24/22 84     Wt Readings from Last 3 Encounters:  12/22/22 160 lb (72.6 kg)  10/31/22 160 lb 12.8 oz (72.9 kg)  09/24/22 161 lb 9.6 oz (73.3 kg)     Kidney Function Lab Results  Component Value Date/Time   CREATININE 1.64 (H) 09/24/2022 02:29 PM   CREATININE 1.46 (H) 07/16/2022 08:51 AM   GFRNONAA 37 (L) 07/16/2022 08:51 AM   GFRAA 41 (L) 05/29/2020 09:23 AM       Latest Ref Rng & Units 09/24/2022    2:29 PM 07/16/2022    8:51 AM 02/21/2022    2:47 PM  BMP  Glucose 70 - 99 mg/dL 161  096  045   BUN 8 - 27 mg/dL 38  32  37   Creatinine 0.57 - 1.00 mg/dL 4.09  8.11  9.14   BUN/Creat Ratio 12 - 28 23     Sodium 134 - 144 mmol/L 138  137  139   Potassium 3.5 - 5.2 mmol/L 5.3  4.1  4.4   Chloride 96 - 106 mmol/L 106  108  107   CO2 20 - 29 mmol/L 18  22  22    Calcium 8.7 - 10.3 mg/dL 9.8  9.6  9.5    Current antihypertensive regimen:  Amlodipine 5 mg take 1 tablet daily Lisinopril 40 mg take 1 tablet daily   Adherence Review: Is the patient currently on ACE/ARB medication? Yes Does the patient have >5 day gap between last estimated fill dates? No  Star Rating Drugs:  Atorvastatin 5 mg Last  filled:09/22/22 100 DS, 12/26/22 100 DS Lisinopril 40 mg Last filled:09/22/22 100 DS, 12/26/22 100 DS Hydrochlorothiazide Last filled:10/15/22 100 DS, 01/18/23 100 DS  Myriam Carolin Coy, RMA

## 2023-03-01 ENCOUNTER — Other Ambulatory Visit: Payer: Self-pay | Admitting: Nurse Practitioner

## 2023-03-01 DIAGNOSIS — I10 Essential (primary) hypertension: Secondary | ICD-10-CM

## 2023-03-02 NOTE — Telephone Encounter (Signed)
Requested Prescriptions  Refused Prescriptions Disp Refills   amLODipine (NORVASC) 5 MG tablet [Pharmacy Med Name: amLODIPine Besylate 5 MG Oral Tablet] 100 tablet 2    Sig: TAKE 1 TABLET BY MOUTH DAILY     Cardiovascular: Calcium Channel Blockers 2 Passed - 03/01/2023 11:01 PM      Passed - Last BP in normal range    BP Readings from Last 1 Encounters:  12/22/22 122/74         Passed - Last Heart Rate in normal range    Pulse Readings from Last 1 Encounters:  12/22/22 80         Passed - Valid encounter within last 6 months    Recent Outpatient Visits           4 months ago Chronic right-sided thoracic back pain   [facility] Larae Grooms, NP   5 months ago Severe pulmonary hypertension (HCC)   [place] [facility] [place], Rutledge T, NP   11 months ago Severe pulmonary hypertension (HCC)   [place] [facility] [place], Corrie Dandy T, NP   1 year ago Severe pulmonary hypertension (HCC)   [place] [facility] [place], Corrie Dandy T, NP   1 year ago Pulmonary arterial hypertension (HCC)   [place] [facility] [place], Corrie Dandy T, NP       Future Appointments             In 4 weeks Cannady, Dorie Rank, NP [place] [facility], [facility]             lisinopril (ZESTRIL) 40 MG tablet [Pharmacy Med Name: Lisinopril 40 MG Oral Tablet] 100 tablet 2    Sig: TAKE 1 TABLET BY MOUTH DAILY     Cardiovascular:  ACE Inhibitors Failed - 03/01/2023 11:01 PM      Failed - Cr in normal range and within 180 days    Creatinine, Ser  Date Value Ref Range Status  09/24/2022 1.64 (H) 0.57 - 1.00 mg/dL Final         Failed - K in normal range and within 180 days    Potassium  Date Value Ref Range Status  09/24/2022 5.3 (H) 3.5 - 5.2 mmol/L Final         Passed - Patient is not pregnant      Passed - Last BP in normal range    BP Readings from Last 1 Encounters:  12/22/22 122/74          Passed - Valid encounter within last 6 months    Recent Outpatient Visits           4 months ago Chronic right-sided thoracic back pain   [facility] Larae Grooms, NP   5 months ago Severe pulmonary hypertension (HCC)   [place] [facility] [place], [place]ley T, NP   11 months ago Severe pulmonary hypertension (HCC)   [place] [facility] [place], Corrie Dandy T, NP   1 year ago Severe pulmonary hypertension (HCC)   [place] [facility] [place], Corrie Dandy T, NP   1 year ago Pulmonary arterial hypertension (HCC)   [place] [facility] [place], Dorie Rank, NP       Future Appointments             In 4 weeks Cannady, Dorie Rank, NP [place] [facility], [facility]

## 2023-03-03 DIAGNOSIS — H903 Sensorineural hearing loss, bilateral: Secondary | ICD-10-CM | POA: Diagnosis not present

## 2023-03-03 DIAGNOSIS — H6123 Impacted cerumen, bilateral: Secondary | ICD-10-CM | POA: Diagnosis not present

## 2023-03-04 ENCOUNTER — Telehealth: Payer: Self-pay | Admitting: Nurse Practitioner

## 2023-03-04 NOTE — Telephone Encounter (Signed)
Copied from CRM [id number]. Topic: Medicare AWV >> Mar 04, 2023  2:20 PM Payton Doughty wrote: Reason for CRM: Called patient to schedule Medicare Annual Wellness Visit (AWV). Left message for patient to call back and schedule Medicare Annual Wellness Visit (AWV).  Last date of AWV: 10/28/21  Please schedule an appointment at any time with Kennedy Bucker, LPN  .  If any questions, please contact me.  Thank you ,  [name]; Care Guide Ambulatory Clinical Support [place] [facility] Direct Dial: [phone]

## 2023-03-06 DIAGNOSIS — J449 Chronic obstructive pulmonary disease, unspecified: Secondary | ICD-10-CM | POA: Diagnosis not present

## 2023-03-11 DIAGNOSIS — J449 Chronic obstructive pulmonary disease, unspecified: Secondary | ICD-10-CM | POA: Diagnosis not present

## 2023-03-17 ENCOUNTER — Telehealth: Payer: Self-pay | Admitting: Nurse Practitioner

## 2023-03-17 NOTE — Telephone Encounter (Signed)
Copied from CRM [id number]. Topic: Medicare AWV >> Mar 17, 2023 10:12 AM Payton Doughty wrote: Reason for CRM: Called patient to schedule Medicare Annual Wellness Visit (AWV). Left message for patient to call back and schedule Medicare Annual Wellness Visit (AWV).  Last date of AWV: 10/28/21  Please schedule an appointment at any time with Kennedy Bucker, LPN  .  If any questions, please contact me.  Thank you ,  [name]; Care Guide Ambulatory Clinical Support [place] [facility] Direct Dial: [phone]

## 2023-03-20 ENCOUNTER — Other Ambulatory Visit: Payer: Self-pay

## 2023-03-23 ENCOUNTER — Encounter: Payer: Self-pay | Admitting: Oncology

## 2023-03-23 ENCOUNTER — Inpatient Hospital Stay (HOSPITAL_BASED_OUTPATIENT_CLINIC_OR_DEPARTMENT_OTHER): Payer: Medicare Other | Admitting: Oncology

## 2023-03-23 ENCOUNTER — Inpatient Hospital Stay: Payer: Medicare Other | Attending: Oncology

## 2023-03-23 VITALS — BP 127/67 | HR 94 | Temp 95.9°F | Resp 18 | Ht 60.0 in | Wt 157.0 lb

## 2023-03-23 DIAGNOSIS — Z87891 Personal history of nicotine dependence: Secondary | ICD-10-CM | POA: Diagnosis not present

## 2023-03-23 DIAGNOSIS — D509 Iron deficiency anemia, unspecified: Secondary | ICD-10-CM | POA: Insufficient documentation

## 2023-03-23 DIAGNOSIS — I129 Hypertensive chronic kidney disease with stage 1 through stage 4 chronic kidney disease, or unspecified chronic kidney disease: Secondary | ICD-10-CM | POA: Insufficient documentation

## 2023-03-23 DIAGNOSIS — N183 Chronic kidney disease, stage 3 unspecified: Secondary | ICD-10-CM | POA: Insufficient documentation

## 2023-03-23 DIAGNOSIS — D631 Anemia in chronic kidney disease: Secondary | ICD-10-CM | POA: Insufficient documentation

## 2023-03-23 LAB — CBC WITH DIFFERENTIAL/PLATELET
Abs Immature Granulocytes: 0.04 10*3/uL (ref 0.00–0.07)
Basophils Absolute: 0.1 10*3/uL (ref 0.0–0.1)
Basophils Relative: 1 %
Eosinophils Absolute: 0.2 10*3/uL (ref 0.0–0.5)
Eosinophils Relative: 2 %
HCT: 35.1 % — ABNORMAL LOW (ref 36.0–46.0)
Hemoglobin: 10.4 g/dL — ABNORMAL LOW (ref 12.0–15.0)
Immature Granulocytes: 0 %
Lymphocytes Relative: 11 %
Lymphs Abs: 1 10*3/uL (ref 0.7–4.0)
MCH: 25.3 pg — ABNORMAL LOW (ref 26.0–34.0)
MCHC: 29.6 g/dL — ABNORMAL LOW (ref 30.0–36.0)
MCV: 85.4 fL (ref 80.0–100.0)
Monocytes Absolute: 0.7 10*3/uL (ref 0.1–1.0)
Monocytes Relative: 7 %
Neutro Abs: 7.5 10*3/uL (ref 1.7–7.7)
Neutrophils Relative %: 79 %
Platelets: 194 10*3/uL (ref 150–400)
RBC: 4.11 MIL/uL (ref 3.87–5.11)
RDW: 14.9 % (ref 11.5–15.5)
WBC: 9.5 10*3/uL (ref 4.0–10.5)
nRBC: 0 % (ref 0.0–0.2)

## 2023-03-23 LAB — IRON AND TIBC
Iron: 38 ug/dL (ref 28–170)
Saturation Ratios: 10 % — ABNORMAL LOW (ref 10.4–31.8)
TIBC: 377 ug/dL (ref 250–450)
UIBC: 339 ug/dL

## 2023-03-23 LAB — FERRITIN: Ferritin: 20 ng/mL (ref 11–307)

## 2023-03-23 NOTE — Progress Notes (Signed)
Hematology/Oncology Consult note [facility]  [phone] [phone]  Patient Care Team: Marjie Skiff, NP as PCP - General (Nurse Practitioner) Midge Minium, MD as Consulting Physician (Gastroenterology) Mady Haagensen, MD (Internal Medicine) Leafy Ro, MD as Consulting Physician (General Surgery) Salena Saner, MD as Consulting Physician (Pulmonary Disease) Creig Hines, MD as Consulting Physician (Oncology) Zettie Pho, [facility] (Pharmacist)   Name of the patient: Madison Franklin  [id number]  1946/02/18   Date of visit: 03/23/23  Diagnosis-anemia of chronic kidney disease as well as iron deficiency anemia  Chief complaint/ Reason for visit-routine follow-up of anemia  Heme/Onc history: patient is a 77 year old female who was seen previously by Dr. Merlene Pulling for iron deficiency anemia and anemia of chronic kidney disease.  She has received IV iron in the past.  Patient also has history of stage III CKD.   She has a history of colonic polyps.   Colonoscopy on 11/14/2016 revealed a sub-optimal prep. Findings included a 10 mm polyp in the cecum, seven 4-10 mm sessile polyps in the transverse colon, a 6 mm polyp in the descending colon, three 5-6 mm sessile polyps in the sigmoid colon,.  Pathology revealed 7 tubular adenomas, 2 hyperplastic polyps, and 2 inflammatory polyps.  Colonoscopy on 11/10/2018 revealed three 5 to 6 mm polyps in the ascending colon (2 tubular adenomas; sessile serrated adenoma), one 8 mm polyp in the transverse colon (tubular adenoma), one 5 mm polyp in the transverse colon (tubular adenoma), one 5 mm polyp in the sigmoid colon (tubular adenoma), one 7 mm polyp at the recto-sigmoid colon (hyperplastic polyp), and non-bleeding external and internal hemorrhoids.  She will have a follow-up colonoscopy in 3 years.  Patient has not required any EPO so far   EGD on 11/10/2018 revealed non-bleeding erosive  gastropathy. Pathology revealed chronic inactive atrophic gastritis with intestinal metaplasia.  There was a single non-bleeding erosion in the upper third of the esophagus.  EGD on 05/11/2019 revealed normal duodenal bulb and second portion of the duodenum. There was non-bleeding erosive gastropathy and gastric mucosal atrophy.  There was a normal gastroesophageal junction and esophagus. Pathology revealed chronic inactive atrophic gastritis with intestinal metaplasia, negative for H pylori.     Interval history-patient has baseline exertional shortness of breath and fatigue.  No recent hospitalizations.  ECOG PS- 3 Pain scale- 0   Review of systems- Review of Systems  Constitutional:  Positive for malaise/fatigue. Negative for chills, fever and weight loss.  HENT:  Negative for congestion, ear discharge and nosebleeds.   Eyes:  Negative for blurred vision.  Respiratory:  Positive for shortness of breath. Negative for cough, hemoptysis, sputum production and wheezing.   Cardiovascular:  Negative for chest pain, palpitations, orthopnea and claudication.  Gastrointestinal:  Negative for abdominal pain, blood in stool, constipation, diarrhea, heartburn, melena, nausea and vomiting.  Genitourinary:  Negative for dysuria, flank pain, frequency, hematuria and urgency.  Musculoskeletal:  Negative for back pain, joint pain and myalgias.  Skin:  Negative for rash.  Neurological:  Negative for dizziness, tingling, focal weakness, seizures, weakness and headaches.  Endo/Heme/Allergies:  Does not bruise/bleed easily.  Psychiatric/Behavioral:  Negative for depression and suicidal ideas. The patient does not have insomnia.       Allergies  Allergen Reactions   Latex Itching and Rash    With latex gloves (when worn)     Past Medical History:  Diagnosis Date   COPD (chronic obstructive pulmonary disease) (HCC)  Dyspnea    Hearing aid worn    bilateral   Hyperlipidemia    Hypertension     Osteoporosis    Personal history of tobacco use, presenting hazards to health 08/15/2015   Pulmonary hypertension, mild (HCC)    mild to moderate     Past Surgical History:  Procedure Laterality Date   ABDOMINAL HYSTERECTOMY     COLONOSCOPY WITH PROPOFOL N/A 11/14/2016   Procedure: COLONOSCOPY WITH PROPOFOL;  Surgeon: Midge Minium, MD;  Location: [facility];  Service: Endoscopy;  Laterality: N/A;   COLONOSCOPY WITH PROPOFOL N/A 11/10/2018   Procedure: COLONOSCOPY WITH PROPOFOL;  Surgeon: Toney Reil, MD;  Location: [facility];  Service: Endoscopy;  Laterality: N/A;   ESOPHAGOGASTRODUODENOSCOPY (EGD) WITH PROPOFOL  11/10/2018   Procedure: ESOPHAGOGASTRODUODENOSCOPY (EGD) WITH PROPOFOL;  Surgeon: Toney Reil, MD;  Location: [facility];  Service: Endoscopy;;   ESOPHAGOGASTRODUODENOSCOPY (EGD) WITH PROPOFOL N/A 05/11/2019   Procedure: ESOPHAGOGASTRODUODENOSCOPY (EGD) WITH PROPOFOL;  Surgeon: Toney Reil, MD;  Location: [facility];  Service: Endoscopy;  Laterality: N/A;   long nodule     POLYPECTOMY  11/14/2016   Procedure: POLYPECTOMY;  Surgeon: Midge Minium, MD;  Location: [facility];  Service: Endoscopy;;   POLYPECTOMY  11/10/2018   Procedure: POLYPECTOMY;  Surgeon: Toney Reil, MD;  Location: [facility];  Service: Endoscopy;;   RIGHT HEART CATH Right 07/14/2017   Procedure: RIGHT HEART CATH;  Surgeon: Yvonne Kendall, MD;  Location: [facility];  Service: Cardiovascular;  Laterality: Right;    Social History   Socioeconomic History   Marital status: Married    Spouse name: Not on file   Number of children: Not on file   Years of education: 12   Highest education level: 12th grade  Occupational History   Occupation: retired  Tobacco Use   Smoking status: Former    Packs/day: 0.50    Years: 30.00    Additional pack years: 0.00    Total pack years: 15.00    Types: Cigarettes    Quit date:  01/17/2017    Years since quitting: 6.1   Smokeless tobacco: Never  Vaping Use   Vaping Use: Never used  Substance and Sexual Activity   Alcohol use: No    Alcohol/week: 0.0 standard drinks of alcohol   Drug use: No   Sexual activity: Yes  Other Topics Concern   Not on file  Social History Narrative   Not on file   Social Determinants of Health   Financial Resource Strain: High Risk (12/29/2022)   Overall Financial Resource Strain (CARDIA)    Difficulty of Paying Living Expenses: Hard  Food Insecurity: No Food Insecurity (10/28/2021)   Hunger Vital Sign    Worried About Running Out of Food in the Last Year: Never true    Ran Out of Food in the Last Year: Never true  Transportation Needs: No Transportation Needs (12/29/2022)   PRAPARE - Administrator, Civil Service (Medical): No    Lack of Transportation (Non-Medical): No  Physical Activity: Insufficiently Active (10/28/2021)   Exercise Vital Sign    Days of Exercise per Week: 2 days    Minutes of Exercise per Session: 10 min  Stress: No Stress Concern Present (10/28/2021)   [employer] of Occupational Health - Occupational Stress Questionnaire    Feeling of Stress : Only a little  Social Connections: Socially Integrated (10/28/2021)   Social Connection and Isolation Panel [NHANES]  Frequency of Communication with Friends and Family: Twice a week    Frequency of Social Gatherings with Friends and Family: Once a week    Attends Religious Services: More than 4 times per year    Active Member of [employer] or Organizations: Yes    Attends Banker Meetings: 1 to 4 times per year    Marital Status: Married  Catering manager Violence: Not At Risk (10/28/2021)   Humiliation, Afraid, Rape, and Kick questionnaire    Fear of Current or Ex-Partner: No    Emotionally Abused: No    Physically Abused: No    Sexually Abused: No    Family History  Problem Relation Age of Onset   Heart disease Neg Hx      Current  Outpatient Medications:    acetaminophen (TYLENOL) 325 MG tablet, Take 325 mg by mouth every 6 (six) hours as needed (for pain.)., Disp: , Rfl:    albuterol (VENTOLIN HFA) 108 (90 Base) MCG/ACT inhaler, USE 2 INHALATIONS BY MOUTH EVERY 6 HOURS AS NEEDED FOR WHEEZING  OR SHORTNESS OF BREATH, Disp: 54 g, Rfl: 1   amLODipine (NORVASC) 5 MG tablet, Take 1 tablet (5 mg total) by mouth daily., Disp: 90 tablet, Rfl: 4   aspirin EC 81 MG tablet, Take 81 mg by mouth daily., Disp: , Rfl:    atorvastatin (LIPITOR) 20 MG tablet, Take 1 tablet (20 mg total) by mouth daily., Disp: 90 tablet, Rfl: 4   Budeson-Glycopyrrol-Formoterol (BREZTRI AEROSPHERE) 160-9-4.8 MCG/ACT AERO, Inhale 2 puffs into the lungs in the morning and at bedtime., Disp: 10.7 g, Rfl: 4   Budeson-Glycopyrrol-Formoterol (BREZTRI AEROSPHERE) 160-9-4.8 MCG/ACT AERO, Inhale 2 puffs into the lungs in the morning and at bedtime., Disp: 11.8 g, Rfl: 0   hydrochlorothiazide (MICROZIDE) 12.5 MG capsule, Take 1 capsule (12.5 mg total) by mouth daily., Disp: 90 capsule, Rfl: 4   lisinopril (ZESTRIL) 40 MG tablet, Take 1 tablet (40 mg total) by mouth daily., Disp: 90 tablet, Rfl: 4   OXYGEN, Inhale into the lungs. Patient is on 3 liters, Disp: , Rfl:    vitamin B-12 (CYANOCOBALAMIN) 1000 MCG tablet, Take 1,000 mcg by mouth daily., Disp: , Rfl:    Vitamin D, Cholecalciferol, 1000 units TABS, Take 1 tablet by mouth daily., Disp: , Rfl:   Physical exam:  Vitals:   03/23/23 1334  BP: 127/67  Pulse: 94  Resp: 18  Temp: (!) 95.9 F (35.5 C)  TempSrc: Tympanic  SpO2: 94%  Weight: 157 lb (71.2 kg)  Height: 5' (1.524 m)   Physical Exam Constitutional:      Comments: Appears frail.  Sitting in a wheelchair on home oxygen  Cardiovascular:     Rate and Rhythm: Normal rate and regular rhythm.     Heart sounds: Normal heart sounds.  Pulmonary:     Effort: Pulmonary effort is normal.     Comments: Breath sounds decreased bilaterally over  bases Abdominal:     General: Bowel sounds are normal.     Palpations: Abdomen is soft.  Skin:    General: Skin is warm and dry.  Neurological:     Mental Status: She is alert and oriented to person, place, and time.         Latest Ref Rng & Units 09/24/2022    2:29 PM  CMP  Glucose 70 - 99 mg/dL 161   BUN 8 - 27 mg/dL 38   Creatinine 0.96 - 1.00 mg/dL 0.45   Sodium 409 -  144 mmol/L 138   Potassium 3.5 - 5.2 mmol/L 5.3   Chloride 96 - 106 mmol/L 106   CO2 20 - 29 mmol/L 18   Calcium 8.7 - 10.3 mg/dL 9.8   Total Protein 6.0 - 8.5 g/dL 7.2   Total Bilirubin 0.0 - 1.2 mg/dL 0.3   Alkaline Phos 44 - 121 IU/L 105   AST 0 - 40 IU/L 12   ALT 0 - 32 IU/L 6       Latest Ref Rng & Units 03/23/2023    1:29 PM  CBC  WBC 4.0 - 10.5 K/uL 9.5   Hemoglobin 12.0 - 15.0 g/dL 16.1   Hematocrit 09.6 - 46.0 % 35.1   Platelets 150 - 400 K/uL 194     Assessment and plan- Patient is a 77 y.o. female here for routine follow-up of anemia  Patient's hemoglobin was between 11-12 in the last 6 months and has drifted down to 10.4 presently.Iron studies are still pending.  Her last ferritin in February 2024 was low at 35 and given that she has chronic kidney disease we could consider giving her IV iron to keep it closer to 100.  We will call her with the results of iron studies from today.  I will repeat CBC ferritin and iron studies in 3 and 6 months and see her back in 6 months I am holding off on giving her EPO at this time   Visit Diagnosis 1. Iron deficiency anemia, unspecified iron deficiency anemia type   2. Anemia in stage 3 chronic kidney disease, unspecified whether stage 3a or 3b CKD (HCC)      Dr. Owens Shark, MD, MPH [facility] at [facility] [phone] 03/23/2023 2:57 PM

## 2023-03-24 ENCOUNTER — Other Ambulatory Visit: Payer: Self-pay | Admitting: Nurse Practitioner

## 2023-03-24 DIAGNOSIS — I10 Essential (primary) hypertension: Secondary | ICD-10-CM

## 2023-03-25 NOTE — Telephone Encounter (Signed)
Requested medication (s) are due for refill today:   Yes for both  Requested medication (s) are on the active medication list:   Yes for both  Future visit scheduled:   Yes 6/18 with Jolene   Last ordered: Both 03/20/2022 #90, 4 refills  Returned because labs due   Requested Prescriptions  Pending Prescriptions Disp Refills   atorvastatin (LIPITOR) 20 MG tablet [Pharmacy Med Name: Atorvastatin Calcium 20 MG Oral Tablet] 100 tablet 2    Sig: TAKE 1 TABLET BY MOUTH DAILY     Cardiovascular:  Antilipid - Statins Failed - 03/24/2023 10:22 PM      Failed - Lipid Panel in normal range within the last 12 months    Cholesterol, Total  Date Value Ref Range Status  09/24/2022 180 100 - 199 mg/dL Final   LDL Chol Calc (NIH)  Date Value Ref Range Status  09/24/2022 96 0 - 99 mg/dL Final   HDL  Date Value Ref Range Status  09/24/2022 54 >39 mg/dL Final   Triglycerides  Date Value Ref Range Status  09/24/2022 176 (H) 0 - 149 mg/dL Final         Passed - Patient is not pregnant      Passed - Valid encounter within last 12 months    Recent Outpatient Visits           4 months ago Chronic right-sided thoracic back pain   [facility] Larae Grooms, NP   6 months ago Severe pulmonary hypertension (HCC)   [place] [facility] [place], [place] T, NP   1 year ago Severe pulmonary hypertension (HCC)   [place] [facility] [place], Corrie Dandy T, NP   1 year ago Severe pulmonary hypertension (HCC)   [place] [facility] [place], Corrie Dandy T, NP   2 years ago Pulmonary arterial hypertension (HCC)   [place] [facility] [place], [place] T, NP       Future Appointments             In 1 week Cannady, Dorie Rank, NP [place] [facility]             hydrochlorothiazide (MICROZIDE) 12.5 MG capsule [Pharmacy Med Name: hydroCHLOROthiazide 12.5 MG Oral Capsule] 100 capsule 2    Sig: TAKE  1 CAPSULE BY MOUTH DAILY     Cardiovascular: Diuretics - Thiazide Failed - 03/24/2023 10:22 PM      Failed - Cr in normal range and within 180 days    Creatinine, Ser  Date Value Ref Range Status  09/24/2022 1.64 (H) 0.57 - 1.00 mg/dL Final         Failed - K in normal range and within 180 days    Potassium  Date Value Ref Range Status  09/24/2022 5.3 (H) 3.5 - 5.2 mmol/L Final         Failed - Na in normal range and within 180 days    Sodium  Date Value Ref Range Status  09/24/2022 138 134 - 144 mmol/L Final         Passed - Last BP in normal range    BP Readings from Last 1 Encounters:  03/23/23 127/67         Passed - Valid encounter within last 6 months    Recent Outpatient Visits           4 months ago Chronic right-sided thoracic back pain   [place] [facility] Larae Grooms, NP   6 months ago Severe pulmonary hypertension (HCC)   [facility] [place], Corrie Dandy T, NP   1 year ago Severe pulmonary hypertension (HCC)   [place] [facility] [place], Corrie Dandy T, NP   1 year ago Severe pulmonary hypertension (HCC)   [place] [facility] [place], Corrie Dandy T, NP   2 years ago Pulmonary arterial hypertension (HCC)   [place] [facility] [place], Dorie Rank, NP       Future Appointments             In 1 week Cannady, Dorie Rank, NP [place] [facility]

## 2023-03-27 ENCOUNTER — Ambulatory Visit: Payer: Medicare Other | Admitting: Nurse Practitioner

## 2023-03-27 ENCOUNTER — Telehealth: Payer: Self-pay | Admitting: *Deleted

## 2023-03-27 NOTE — Telephone Encounter (Signed)
-----   Message from Creig Hines, MD sent at 03/23/2023  3:41 PM EDT ----- Needs IV iron.  We can give her Venofer x 5

## 2023-03-27 NOTE — Telephone Encounter (Signed)
I called to see if pt wanted IV iron, her iron resutls were low and dr Smith Robert wanted her to get IV iron if she is ok. The answer is yes and I will look at what iron the insurance covers and give them a call with dates for the pt.

## 2023-03-30 ENCOUNTER — Ambulatory Visit: Payer: Medicare Other | Admitting: Nurse Practitioner

## 2023-04-01 DIAGNOSIS — G4733 Obstructive sleep apnea (adult) (pediatric): Secondary | ICD-10-CM | POA: Diagnosis not present

## 2023-04-05 NOTE — Patient Instructions (Signed)
Please call to schedule your bone density: [facility]  Address: [address], [place], [place] [address] Phone: [phone]   Imaging at [facility] [address] [place],  [place]  [address] Phone: [phone]    DASH Eating Plan DASH stands for Dietary Approaches to Stop Hypertension. The DASH eating plan is a healthy eating plan that has been shown to: Lower high blood pressure (hypertension). Reduce your risk for type 2 diabetes, heart disease, and stroke. Help with weight loss. What are tips for following this plan? Reading food labels Check food labels for the amount of salt (sodium) per serving. Choose foods with less than 5 percent of the Daily Value (DV) of sodium. In general, foods with less than 300 milligrams (mg) of sodium per serving fit into this eating plan. To find whole grains, look for the word "whole" as the first word in the ingredient list. Shopping Buy products labeled as "low-sodium" or "no salt added." Buy fresh foods. Avoid canned foods and pre-made or frozen meals. Cooking Try not to add salt when you cook. Use salt-free seasonings or herbs instead of table salt or sea salt. Check with your health care provider or pharmacist before using salt substitutes. Do not fry foods. Cook foods in healthy ways, such as baking, boiling, grilling, roasting, or broiling. Cook using oils that are good for your heart. These include olive, canola, avocado, soybean, and sunflower oil. Meal planning  Eat a balanced diet. This should include: 4 or more servings of fruits and 4 or more servings of vegetables each day. Try to fill half of your plate with fruits and vegetables. 6-8 servings of whole grains each day. 6 or less servings of lean meat, poultry, or fish each day. 1 oz is 1 serving. A 3 oz (85 g) serving of meat is about the same size as the palm of your hand. One egg is 1 oz (28 g). 2-3 servings  of low-fat dairy each day. One serving is 1 cup (237 mL). 1 serving of nuts, seeds, or beans 5 times each week. 2-3 servings of heart-healthy fats. Healthy fats called omega-3 fatty acids are found in foods such as walnuts, flaxseeds, fortified milks, and eggs. These fats are also found in cold-water fish, such as sardines, salmon, and mackerel. Limit how much you eat of: Canned or prepackaged foods. Food that is high in trans fat, such as fried foods. Food that is high in saturated fat, such as fatty meat. Desserts and other sweets, sugary drinks, and other foods with added sugar. Full-fat dairy products. Do not salt foods before eating. Do not eat more than 4 egg yolks a week. Try to eat at least 2 vegetarian meals a week. Eat more home-cooked food and less restaurant, buffet, and fast food. Lifestyle When eating at a restaurant, ask if your food can be made with less salt or no salt. If you drink alcohol: Limit how much you have to: 0-1 drink a day if you are female. 0-2 drinks a day if you are female. Know how much alcohol is in your drink. In the [place] one drink is one 12 oz bottle of beer (355 mL), one 5 oz glass of wine (148 mL), or one 1 oz glass of hard liquor (44 mL). General information Avoid eating more than 2,300 mg of salt a day. If you have hypertension, you may need to reduce your sodium intake to 1,500 mg a day.  Work with your provider to stay at a healthy body weight or lose weight. Ask what the best weight range is for you. On most days of the week, get at least 30 minutes of exercise that causes your heart to beat faster. This may include walking, swimming, or biking. Work with your provider or dietitian to adjust your eating plan to meet your specific calorie needs. What foods should I eat? Fruits All fresh, dried, or frozen fruit. Canned fruits that are in their natural juice and do not have sugar added to them. Vegetables Fresh or frozen vegetables that are raw,  steamed, roasted, or grilled. Low-sodium or reduced-sodium tomato and vegetable juice. Low-sodium or reduced-sodium tomato sauce and tomato paste. Low-sodium or reduced-sodium canned vegetables. Grains Whole-grain or whole-wheat bread. Whole-grain or whole-wheat pasta. Brown rice. Orpah Cobb. Bulgur. Whole-grain and low-sodium cereals. Pita bread. Low-fat, low-sodium crackers. Whole-wheat flour tortillas. Meats and other proteins Skinless chicken or [place]. Ground chicken or [place]. Pork with fat trimmed off. Fish and seafood. Egg whites. Dried beans, peas, or lentils. Unsalted nuts, nut butters, and seeds. Unsalted canned beans. Lean cuts of beef with fat trimmed off. Low-sodium, lean precooked or cured meat, such as sausages or meat loaves. Dairy Low-fat (1%) or fat-free (skim) milk. Reduced-fat, low-fat, or fat-free cheeses. Nonfat, low-sodium ricotta or cottage cheese. Low-fat or nonfat yogurt. Low-fat, low-sodium cheese. Fats and oils Soft margarine without trans fats. Vegetable oil. Reduced-fat, low-fat, or light mayonnaise and salad dressings (reduced-sodium). Canola, safflower, olive, avocado, soybean, and sunflower oils. Avocado. Seasonings and condiments Herbs. Spices. Seasoning mixes without salt. Other foods Unsalted popcorn and pretzels. Fat-free sweets. The items listed above may not be all the foods and drinks you can have. Talk to a dietitian to learn more. What foods should I avoid? Fruits Canned fruit in a light or heavy syrup. Fried fruit. Fruit in cream or butter sauce. Vegetables Creamed or fried vegetables. Vegetables in a cheese sauce. Regular canned vegetables that are not marked as low-sodium or reduced-sodium. Regular canned tomato sauce and paste that are not marked as low-sodium or reduced-sodium. Regular tomato and vegetable juices that are not marked as low-sodium or reduced-sodium. Rosita Fire. Olives. Grains Baked goods made with fat, such as croissants, muffins,  or some breads. Dry pasta or rice meal packs. Meats and other proteins Fatty cuts of meat. Ribs. Fried meat. Tomasa Blase. Bologna, salami, and other precooked or cured meats, such as sausages or meat loaves, that are not lean and low in sodium. Fat from the back of a pig (fatback). Bratwurst. Salted nuts and seeds. Canned beans with added salt. Canned or smoked fish. Whole eggs or egg yolks. Chicken or [place] with skin. Dairy Whole or 2% milk, cream, and half-and-half. Whole or full-fat cream cheese. Whole-fat or sweetened yogurt. Full-fat cheese. Nondairy creamers. Whipped toppings. Processed cheese and cheese spreads. Fats and oils Butter. Stick margarine. Lard. Shortening. Ghee. Bacon fat. Tropical oils, such as coconut, palm kernel, or palm oil. Seasonings and condiments Onion salt, garlic salt, seasoned salt, table salt, and sea salt. Worcestershire sauce. Tartar sauce. Barbecue sauce. Teriyaki sauce. Soy sauce, including reduced-sodium soy sauce. Steak sauce. Canned and packaged gravies. Fish sauce. Oyster sauce. Cocktail sauce. Store-bought horseradish. Ketchup. Mustard. Meat flavorings and tenderizers. Bouillon cubes. Hot sauces. Pre-made or packaged marinades. Pre-made or packaged taco seasonings. Relishes. Regular salad dressings. Other foods Salted popcorn and pretzels. The items listed above may not be all the foods and drinks you should avoid. Talk to a dietitian to  learn more. Where to find more information National Heart, Lung, and Blood Institute (NHLBI): BuffaloDryCleaner.gl American Heart Association (AHA): heart.org Academy of Nutrition and Dietetics: eatright.org National Kidney Foundation (NKF): kidney.org This information is not intended to replace advice given to you by your health care provider. Make sure you discuss any questions you have with your health care provider. Document Revised: 10/23/2022 Document Reviewed: 10/23/2022 Elsevier Patient Education  2024 [employer].

## 2023-04-06 ENCOUNTER — Inpatient Hospital Stay: Payer: Medicare Other

## 2023-04-06 VITALS — BP 133/68 | HR 74 | Temp 97.8°F | Resp 18

## 2023-04-06 DIAGNOSIS — J449 Chronic obstructive pulmonary disease, unspecified: Secondary | ICD-10-CM | POA: Diagnosis not present

## 2023-04-06 DIAGNOSIS — N183 Chronic kidney disease, stage 3 unspecified: Secondary | ICD-10-CM | POA: Diagnosis not present

## 2023-04-06 DIAGNOSIS — D509 Iron deficiency anemia, unspecified: Secondary | ICD-10-CM | POA: Diagnosis not present

## 2023-04-06 DIAGNOSIS — Z87891 Personal history of nicotine dependence: Secondary | ICD-10-CM | POA: Diagnosis not present

## 2023-04-06 DIAGNOSIS — D631 Anemia in chronic kidney disease: Secondary | ICD-10-CM | POA: Diagnosis not present

## 2023-04-06 DIAGNOSIS — I129 Hypertensive chronic kidney disease with stage 1 through stage 4 chronic kidney disease, or unspecified chronic kidney disease: Secondary | ICD-10-CM | POA: Diagnosis not present

## 2023-04-06 MED ORDER — SODIUM CHLORIDE 0.9 % IV SOLN
200.0000 mg | INTRAVENOUS | Status: DC
  Administered 2023-04-06: 200 mg via INTRAVENOUS
  Filled 2023-04-06: qty 200

## 2023-04-06 MED ORDER — SODIUM CHLORIDE 0.9 % IV SOLN
Freq: Once | INTRAVENOUS | Status: AC
  Filled 2023-04-06: qty 250

## 2023-04-06 NOTE — Patient Instructions (Signed)

## 2023-04-07 ENCOUNTER — Encounter: Payer: Self-pay | Admitting: Nurse Practitioner

## 2023-04-07 ENCOUNTER — Ambulatory Visit (INDEPENDENT_AMBULATORY_CARE_PROVIDER_SITE_OTHER): Payer: Medicare Other | Admitting: Nurse Practitioner

## 2023-04-07 VITALS — BP 128/64 | HR 75 | Temp 97.7°F | Ht 60.0 in | Wt 157.6 lb

## 2023-04-07 DIAGNOSIS — E782 Mixed hyperlipidemia: Secondary | ICD-10-CM | POA: Diagnosis not present

## 2023-04-07 DIAGNOSIS — J449 Chronic obstructive pulmonary disease, unspecified: Secondary | ICD-10-CM

## 2023-04-07 DIAGNOSIS — N1832 Chronic kidney disease, stage 3b: Secondary | ICD-10-CM | POA: Diagnosis not present

## 2023-04-07 DIAGNOSIS — G4733 Obstructive sleep apnea (adult) (pediatric): Secondary | ICD-10-CM

## 2023-04-07 DIAGNOSIS — M8588 Other specified disorders of bone density and structure, other site: Secondary | ICD-10-CM | POA: Diagnosis not present

## 2023-04-07 DIAGNOSIS — D631 Anemia in chronic kidney disease: Secondary | ICD-10-CM | POA: Diagnosis not present

## 2023-04-07 DIAGNOSIS — I272 Pulmonary hypertension, unspecified: Secondary | ICD-10-CM | POA: Diagnosis not present

## 2023-04-07 DIAGNOSIS — I700 Atherosclerosis of aorta: Secondary | ICD-10-CM | POA: Diagnosis not present

## 2023-04-07 DIAGNOSIS — J9611 Chronic respiratory failure with hypoxia: Secondary | ICD-10-CM

## 2023-04-07 DIAGNOSIS — I10 Essential (primary) hypertension: Secondary | ICD-10-CM

## 2023-04-07 DIAGNOSIS — R011 Cardiac murmur, unspecified: Secondary | ICD-10-CM

## 2023-04-07 DIAGNOSIS — Z0000 Encounter for general adult medical examination without abnormal findings: Secondary | ICD-10-CM

## 2023-04-07 NOTE — Progress Notes (Signed)
Appointment has been made

## 2023-04-07 NOTE — Assessment & Plan Note (Signed)
Chronic, ongoing.  Will continue Lisinopril for kidney protection, consider change to Losartan next visit due to underlying COPD.   Continue collaboration with nephrology and hematology.  Recent labs and notes reviewed.

## 2023-04-07 NOTE — Assessment & Plan Note (Signed)
Noted on exam, continue to monitor, return to cardiology as needed.   

## 2023-04-07 NOTE — Assessment & Plan Note (Signed)
Chronic.  Noted on CT 07/25/2018.  Continue daily statin and ASA.  Continue cessation of smoking.   

## 2023-04-07 NOTE — Assessment & Plan Note (Signed)
Chronic, progressive in nature.  Continue collaboration with pulmonary.  Continue current inhaler regimen and adjust as needed -- CCM team involved to assist.  Needs repeat CT lung, recommend they return call to scheduler and schedule scan.  Consider cardiology in future. 

## 2023-04-07 NOTE — Assessment & Plan Note (Signed)
Ongoing. Followed by pulmonary and continues O2 supplementation, which she reports has been beneficial -- will continue this, uses 3 L at baseline.

## 2023-04-07 NOTE — Assessment & Plan Note (Signed)
Chronic, stable.  BP at goal on recheck today and on home readings.  Continue current medication regimen and adjust as needed.  Will continue Lisinopril for kidney protection, consider change to Losartan next visit due to underlying COPD.  Recommend continue to monitor BP at home daily and focus on DASH diet.  Labs up to date with hematology.  Return in 6 months.

## 2023-04-07 NOTE — Assessment & Plan Note (Signed)
Chronic, stable.  Continue collaboration with hematology, recent note reviewed.  Labs up to date with hematology. 

## 2023-04-07 NOTE — Progress Notes (Signed)
BP 128/64 (BP Location: Left Arm, Patient Position: Sitting, Cuff Size: Normal)   Pulse 75 Comment: apical  Temp 97.7 F (36.5 C) (Oral)   Ht 5' (1.524 m)   Wt 157 lb 9.6 oz (71.5 kg)   LMP  (LMP Unknown)   SpO2 91% Comment: 3L [place]  BMI 30.78 kg/m    Subjective:    Patient ID: Madison Franklin, female    DOB: 05-15-1946, 77 y.o.   MRN: [id number]  HPI: Madison Franklin is a 77 y.o. female  Chief Complaint  Patient presents with   COPD   Hypertension   Hyperlipidemia   Osetopenia   Pulmonary hypertension   Husband present at bedside to assist with HPI.  Initial O2 sats in 80 range with O2 in place and HR 130-140 range upon walking into office from car, these improved with rest.  HYPERTENSION / HYPERLIPIDEMIA Continues on Lisinopril, Amlodipine, and HCTZ + Atorvastatin.   Duration of hypertension: chronic BP monitoring frequency: daily BP range: 130-135/70 range BP medication side effects: no Duration of hyperlipidemia: chronic Cholesterol medication side effects: no Cholesterol supplements: none Medication compliance: good compliance Aspirin: yes Recent stressors: no Recurrent headaches: no Visual changes: no Palpitations: no Dyspnea: baseline, no worsening Chest pain: no Lower extremity edema: no Dizzy/lightheaded: no   OSTEOPENIA Osteopenia noted on pat imaging, needs repeat DEXA -- ordered but has not called to schedule as instructed. No recent falls or fractures.  Has walker but does not use.  Satisfied with current treatment?: yes Adequate calcium & vitamin D: yes Weight bearing exercises: no   COPD Pulmonary visit last on 12/22/22, she is to continue Breztri and oxygen at 3L  Due to her severe pulmonary hypertension she is on O2 supplementation 3L [place] when exerting and at bedtime, continues to wear mask at night.  To have yearly lung CT and they have tried to contact her to follow-up, has lung nodules to monitor -- last 2019 -- have discussed at visits with patient --  they have not scheduled as of yet.  Quit smoking > 5 years ago.    Echo on 03/28/22 with EF 60-65% -- severe elevated pulmonary systolic pressure and right atrial size moderately dilated. COPD status: stable Satisfied with current treatment?: yes Oxygen use: yes Dyspnea frequency: at baseline when active Cough frequency: occasionally Rescue inhaler frequency: 2-3 times a day Limitation of activity: yes Productive cough: none at present Last Spirometry: with pulmonary Pneumovax: Up to Date Influenza: Up to Date    CHRONIC KIDNEY DISEASE Saw nephrology 02/17/23 = CRT 2.02, eGFR 25.  No changes made, to maintain Lisinopril at this time.   CKD status: stable Medications renally dose: yes Previous renal evaluation: no Pneumovax:  Up to Date Influenza Vaccine:  Up to Date    ANEMIA Followed by hematology with last visit on 03/23/23 -- continues on oral B12.  They provided iron infusion on 04/06/23 due to recent labs.  She does endorse being a little more fatigued over past 2 weeks. Anemia status: stable Etiology of anemia: Duration of anemia treatment:  Compliance with treatment: good compliance Iron supplementation side effects: no Severity of anemia: mild Fatigue: yes Decreased exercise tolerance: at times as noted above Dyspnea on exertion: no Palpitations: no Bleeding: no Pica: no  Relevant past medical, surgical, family and social history reviewed and updated as indicated. Interim medical history since our last visit reviewed. Allergies and medications reviewed and updated.  Review of Systems  Constitutional:  Negative for  activity change, appetite change, diaphoresis, fatigue and fever.  Respiratory:  Positive for cough (at baseline), shortness of breath (at baseline) and wheezing (at baseline). Negative for chest tightness.   Cardiovascular:  Negative for chest pain, palpitations and leg swelling.  Gastrointestinal: Negative.   Neurological: Negative.    Psychiatric/Behavioral: Negative.      Per HPI unless specifically indicated above     Objective:    BP 128/64 (BP Location: Left Arm, Patient Position: Sitting, Cuff Size: Normal)   Pulse 75 Comment: apical  Temp 97.7 F (36.5 C) (Oral)   Ht 5' (1.524 m)   Wt 157 lb 9.6 oz (71.5 kg)   LMP  (LMP Unknown)   SpO2 91% Comment: 3L [place]  BMI 30.78 kg/m   Wt Readings from Last 3 Encounters:  04/07/23 157 lb 9.6 oz (71.5 kg)  03/23/23 157 lb (71.2 kg)  12/22/22 160 lb (72.6 kg)    Physical Exam Vitals and nursing note reviewed.  Constitutional:      General: She is awake. She is not in acute distress.    Appearance: She is well-developed, well-groomed and overweight. She is not ill-appearing.  HENT:     Head: Normocephalic.     Right Ear: Hearing normal.     Left Ear: Hearing normal.  Eyes:     General: Lids are normal.        Right eye: No discharge.        Left eye: No discharge.     Conjunctiva/sclera: Conjunctivae normal.     Pupils: Pupils are equal, round, and reactive to light.  Neck:     Thyroid: No thyromegaly.     Vascular: No carotid bruit.  Cardiovascular:     Rate and Rhythm: Normal rate and regular rhythm.     Heart sounds: Murmur heard.     Systolic murmur is present with a grade of 3/6.     No gallop.  Pulmonary:     Effort: Pulmonary effort is normal.     Breath sounds: Decreased breath sounds and wheezing present.     Comments: Overall clear throughout, but diminished breath sounds per baseline with occasional expiratory wheezing, no rhonchi, and no cough present.  No SOB with talking noted.  Wearing [place] O2 3L.   Abdominal:     General: Bowel sounds are normal.     Palpations: Abdomen is soft.  Musculoskeletal:     Cervical back: Normal range of motion and neck supple.     Right lower leg: No edema.     Left lower leg: No edema.     Comments: Significant kyphosis present.  Lymphadenopathy:     Cervical: No cervical adenopathy.  Skin:    General:  Skin is warm and dry.  Neurological:     Mental Status: She is alert and oriented to person, place, and time.  Psychiatric:        Attention and Perception: Attention normal.        Mood and Affect: Mood normal.        Speech: Speech normal.        Behavior: Behavior normal. Behavior is cooperative.        Thought Content: Thought content normal.    Results for orders placed or performed in visit on 03/23/23  Iron and TIBC  Result Value Ref Range   Iron 38 28 - 170 ug/dL   TIBC 409 811 - 914 ug/dL   Saturation Ratios 10 (L) 10.4 - 31.8 %  UIBC 339 ug/dL  Ferritin  Result Value Ref Range   Ferritin 20 11 - 307 ng/mL  CBC with Differential/Platelet  Result Value Ref Range   WBC 9.5 4.0 - 10.5 K/uL   RBC 4.11 3.87 - 5.11 MIL/uL   Hemoglobin 10.4 (L) 12.0 - 15.0 g/dL   HCT 06.3 (L) 01.6 - 01.0 %   MCV 85.4 80.0 - 100.0 fL   MCH 25.3 (L) 26.0 - 34.0 pg   MCHC 29.6 (L) 30.0 - 36.0 g/dL   RDW 93.2 35.5 - 73.2 %   Platelets 194 150 - 400 K/uL   nRBC 0.0 0.0 - 0.2 %   Neutrophils Relative % 79 %   Neutro Abs 7.5 1.7 - 7.7 K/uL   Lymphocytes Relative 11 %   Lymphs Abs 1.0 0.7 - 4.0 K/uL   Monocytes Relative 7 %   Monocytes Absolute 0.7 0.1 - 1.0 K/uL   Eosinophils Relative 2 %   Eosinophils Absolute 0.2 0.0 - 0.5 K/uL   Basophils Relative 1 %   Basophils Absolute 0.1 0.0 - 0.1 K/uL   Immature Granulocytes 0 %   Abs Immature Granulocytes 0.04 0.00 - 0.07 K/uL      Assessment & Plan:   Problem List Items Addressed This Visit       Cardiovascular and Mediastinum   Aortic atherosclerosis (HCC)    Chronic.  Noted on CT 07/25/2018.  Continue daily statin and ASA.  Continue cessation of smoking.        Essential hypertension    Chronic, stable.  BP at goal on recheck today and on home readings.  Continue current medication regimen and adjust as needed.  Will continue Lisinopril for kidney protection, consider change to Losartan next visit due to underlying COPD.  Recommend  continue to monitor BP at home daily and focus on DASH diet.  Labs up to date with hematology.  Return in 6 months.      Severe pulmonary hypertension (HCC)    Chronic, progressive in nature.  Continue collaboration with pulmonary.  Continue current inhaler regimen and adjust as needed -- CCM team involved to assist.  Needs repeat CT lung, recommend they return call to scheduler and schedule scan.  Consider cardiology in future.        Respiratory   Chronic respiratory failure with hypoxia (HCC)    Ongoing. Followed by pulmonary and continues O2 supplementation, which she reports has been beneficial -- will continue this, uses 3 L at baseline.      COPD, severe (HCC) - Primary    Chronic, ongoing.  Continue collaboration with pulmonary and current inhaler regimen.  Recommend she schedule annual lung CT scan for follow-up and provided husband number to call last visit, they have not called, highly recommend they schedule this.  Return in 6 months for follow-up, sooner if worsening symptoms.       OSA (obstructive sleep apnea)    Followed by pulmonary, continue O2 at bedtime.        Musculoskeletal and Integument   Osteopenia    Chronic.  Noted on scan in 2014 -- check Vit D level today and continue supplement.  Ordered repeat DEXA and instructed her and husband on how to call and schedule.        Genitourinary   RESOLVED: Anemia in chronic kidney disease (CKD)   Chronic kidney disease, stage 3 (HCC)    Chronic, ongoing.  Will continue Lisinopril for kidney protection, consider change to Losartan next visit  due to underlying COPD.   Continue collaboration with nephrology and hematology.  Recent labs and notes reviewed.        Other   Heart murmur    Noted on exam, continue to monitor, return to cardiology as needed.        Hyperlipidemia    Chronic, ongoing.  Continue current medication regimen and adjust as needed.  Lipid panel today.      Relevant Orders   Lipid Panel w/o  Chol/HDL Ratio     Follow up plan: Return in about 6 months (around 10/07/2023) for COPD, PULMONARY HYPERTENSION,  HTN/HLD, CKD, OSTEOPENIA.

## 2023-04-07 NOTE — Assessment & Plan Note (Addendum)
Chronic, ongoing.  Continue collaboration with pulmonary and current inhaler regimen.  Recommend she schedule annual lung CT scan for follow-up and provided husband number to call last visit, they have not called, highly recommend they schedule this.  Return in 6 months for follow-up, sooner if worsening symptoms.

## 2023-04-07 NOTE — Assessment & Plan Note (Signed)
Followed by pulmonary, continue O2 at bedtime. 

## 2023-04-07 NOTE — Assessment & Plan Note (Signed)
Chronic.  Noted on scan in 2014 -- check Vit D level today and continue supplement.  Ordered repeat DEXA and instructed her and husband on how to call and schedule. 

## 2023-04-07 NOTE — Assessment & Plan Note (Signed)
Chronic, ongoing.  Continue current medication regimen and adjust as needed. Lipid panel today. 

## 2023-04-08 ENCOUNTER — Inpatient Hospital Stay: Payer: Medicare Other

## 2023-04-08 VITALS — BP 126/53 | HR 81 | Temp 97.9°F | Resp 16

## 2023-04-08 DIAGNOSIS — D509 Iron deficiency anemia, unspecified: Secondary | ICD-10-CM | POA: Diagnosis not present

## 2023-04-08 DIAGNOSIS — D631 Anemia in chronic kidney disease: Secondary | ICD-10-CM

## 2023-04-08 DIAGNOSIS — I129 Hypertensive chronic kidney disease with stage 1 through stage 4 chronic kidney disease, or unspecified chronic kidney disease: Secondary | ICD-10-CM | POA: Diagnosis not present

## 2023-04-08 DIAGNOSIS — N183 Chronic kidney disease, stage 3 unspecified: Secondary | ICD-10-CM | POA: Diagnosis not present

## 2023-04-08 DIAGNOSIS — Z87891 Personal history of nicotine dependence: Secondary | ICD-10-CM | POA: Diagnosis not present

## 2023-04-08 LAB — LIPID PANEL W/O CHOL/HDL RATIO
Cholesterol, Total: 155 mg/dL (ref 100–199)
HDL: 61 mg/dL (ref >39)
LDL Chol Calc (NIH): 72 mg/dL (ref 0–99)
Triglycerides: 127 mg/dL (ref 0–149)
VLDL Cholesterol Cal: 22 mg/dL (ref 5–40)

## 2023-04-08 MED ORDER — SODIUM CHLORIDE 0.9 % IV SOLN
200.0000 mg | Freq: Once | INTRAVENOUS | Status: AC
  Administered 2023-04-08: 200 mg via INTRAVENOUS
  Filled 2023-04-08: qty 200

## 2023-04-08 MED ORDER — SODIUM CHLORIDE 0.9 % IV SOLN
INTRAVENOUS | Status: DC
  Filled 2023-04-08: qty 250

## 2023-04-08 NOTE — Progress Notes (Signed)
Contacted via MyChart   Good afternoon Hasini, your labs have returned and your cholesterol levels have returned with stable levels.  No medication changes needed.  Any questions? Keep being amazing!!  Thank you for allowing me to participate in your care.  I appreciate you. Kindest regards, Senica Crall

## 2023-04-10 ENCOUNTER — Inpatient Hospital Stay: Payer: Medicare Other

## 2023-04-10 VITALS — BP 124/62 | HR 72 | Temp 97.0°F

## 2023-04-10 DIAGNOSIS — D631 Anemia in chronic kidney disease: Secondary | ICD-10-CM

## 2023-04-10 DIAGNOSIS — I129 Hypertensive chronic kidney disease with stage 1 through stage 4 chronic kidney disease, or unspecified chronic kidney disease: Secondary | ICD-10-CM | POA: Diagnosis not present

## 2023-04-10 DIAGNOSIS — Z87891 Personal history of nicotine dependence: Secondary | ICD-10-CM | POA: Diagnosis not present

## 2023-04-10 DIAGNOSIS — N183 Chronic kidney disease, stage 3 unspecified: Secondary | ICD-10-CM | POA: Diagnosis not present

## 2023-04-10 DIAGNOSIS — D509 Iron deficiency anemia, unspecified: Secondary | ICD-10-CM | POA: Diagnosis not present

## 2023-04-10 MED ORDER — SODIUM CHLORIDE 0.9 % IV SOLN
200.0000 mg | Freq: Once | INTRAVENOUS | Status: AC
  Administered 2023-04-10: 200 mg via INTRAVENOUS
  Filled 2023-04-10: qty 200

## 2023-04-10 MED ORDER — SODIUM CHLORIDE 0.9 % IV SOLN
Freq: Once | INTRAVENOUS | Status: AC
  Filled 2023-04-10: qty 250

## 2023-04-10 NOTE — Patient Instructions (Signed)

## 2023-04-11 DIAGNOSIS — J449 Chronic obstructive pulmonary disease, unspecified: Secondary | ICD-10-CM | POA: Diagnosis not present

## 2023-04-13 ENCOUNTER — Inpatient Hospital Stay: Payer: Medicare Other

## 2023-04-13 VITALS — BP 120/54 | HR 90 | Temp 97.2°F

## 2023-04-13 DIAGNOSIS — D509 Iron deficiency anemia, unspecified: Secondary | ICD-10-CM | POA: Diagnosis not present

## 2023-04-13 DIAGNOSIS — I129 Hypertensive chronic kidney disease with stage 1 through stage 4 chronic kidney disease, or unspecified chronic kidney disease: Secondary | ICD-10-CM | POA: Diagnosis not present

## 2023-04-13 DIAGNOSIS — N183 Chronic kidney disease, stage 3 unspecified: Secondary | ICD-10-CM | POA: Diagnosis not present

## 2023-04-13 DIAGNOSIS — D631 Anemia in chronic kidney disease: Secondary | ICD-10-CM | POA: Diagnosis not present

## 2023-04-13 DIAGNOSIS — Z87891 Personal history of nicotine dependence: Secondary | ICD-10-CM | POA: Diagnosis not present

## 2023-04-13 MED ORDER — SODIUM CHLORIDE 0.9 % IV SOLN
Freq: Once | INTRAVENOUS | Status: AC
  Filled 2023-04-13: qty 250

## 2023-04-13 MED ORDER — SODIUM CHLORIDE 0.9 % IV SOLN
200.0000 mg | Freq: Once | INTRAVENOUS | Status: AC
  Administered 2023-04-13: 200 mg via INTRAVENOUS
  Filled 2023-04-13: qty 200

## 2023-04-15 ENCOUNTER — Inpatient Hospital Stay: Payer: Medicare Other

## 2023-04-15 VITALS — BP 127/87 | HR 79 | Temp 97.7°F | Resp 18

## 2023-04-15 DIAGNOSIS — I129 Hypertensive chronic kidney disease with stage 1 through stage 4 chronic kidney disease, or unspecified chronic kidney disease: Secondary | ICD-10-CM | POA: Diagnosis not present

## 2023-04-15 DIAGNOSIS — Z87891 Personal history of nicotine dependence: Secondary | ICD-10-CM | POA: Diagnosis not present

## 2023-04-15 DIAGNOSIS — D631 Anemia in chronic kidney disease: Secondary | ICD-10-CM

## 2023-04-15 DIAGNOSIS — D509 Iron deficiency anemia, unspecified: Secondary | ICD-10-CM | POA: Diagnosis not present

## 2023-04-15 DIAGNOSIS — N183 Chronic kidney disease, stage 3 unspecified: Secondary | ICD-10-CM | POA: Diagnosis not present

## 2023-04-15 MED ORDER — SODIUM CHLORIDE 0.9 % IV SOLN
200.0000 mg | Freq: Once | INTRAVENOUS | Status: AC
  Administered 2023-04-15: 200 mg via INTRAVENOUS
  Filled 2023-04-15: qty 200

## 2023-04-15 MED ORDER — SODIUM CHLORIDE 0.9 % IV SOLN
Freq: Once | INTRAVENOUS | Status: AC
  Filled 2023-04-15: qty 250

## 2023-04-15 NOTE — Progress Notes (Signed)
Pt arrived with oxygen tank with dead battery. HR ranging 60s-140s and oxygen 86%. Pt placed on 3L [place] and oxygen stabilized at 96%. Dr. Donneta Romberg notified of irregular HR. After being on oxygen for a few minutes, HR stabilized into the 70s. Dr. Donneta Romberg made aware HR had stabilized. Proceeding with venofer was verified by the team.

## 2023-04-16 ENCOUNTER — Ambulatory Visit: Payer: Medicare Other | Admitting: Pulmonary Disease

## 2023-04-27 ENCOUNTER — Ambulatory Visit (INDEPENDENT_AMBULATORY_CARE_PROVIDER_SITE_OTHER): Payer: Medicare Other

## 2023-04-27 VITALS — BP 138/74 | Ht 60.0 in | Wt 155.4 lb

## 2023-04-27 DIAGNOSIS — Z0000 Encounter for general adult medical examination without abnormal findings: Secondary | ICD-10-CM

## 2023-04-27 NOTE — Patient Instructions (Signed)
Madison Franklin , Thank you for taking time to come for your Medicare Wellness Visit. I appreciate your ongoing commitment to your health goals. Please review the following plan we discussed and let me know if I can assist you in the future.   These are the goals we discussed:  Goals       DIET - EAT MORE FRUITS AND VEGETABLES      Patient Stated      10/26/2020, no goals      Patient Stated      No goals      PharmD "I can't afford my medication" (pt-stated)      CARE PLAN ENTRY (see longitudinal plan of care for additional care plan information)  Current Barriers:  Polypharmacy; complex patient with multiple comorbidities including COPD, HLD, HTN Financial concerns - patient reports today that she cannot afford Anoro Most recent eGFR: ~ 40 mL/min - last saw nephrology Dr. Cherylann Ratel in 2019 COPD: Anoro prescribed, but too expensive so she does not have. Albuterol HFA PRN, usually 1-2 times daily FAO:[id number] 5 mg daily, HCTZ 12.5 mg daily, lisinopril 40 mg ASCVD risk reduction: atorvastatin 10 mg daily; last LDL 105. Reports adherence. PCP noted she wanted to increase to 20 mg daily, but does not appear patient was informed; ASA 81 mg daily Supplements: Vitamin B12, vitamin D  Pharmacist Clinical Goal(s):  Over the next 90 days, patient will work with PharmD and provider towards optimized medication management  Interventions: Comprehensive medication review performed; medication list updated in electronic medical record Inter-disciplinary care team collaboration (see longitudinal plan of care) Discussed income. Patient meets income requirement for Anoro through [employer], but has not spent the required $600 out of pocket on her prescriptions. She would meet requirements for Stiolto through [employer]. Will collaborate w/ PCP to order Stiolto 2.5/2.5 mcg 2 puffs daily, No Print. Will collaborate w/ [facility] to mail patient portion of the application. Will collaborate w/ PCP for provider portion. Once all parts  received, will submit to [facility].  Discussed lipids. Patient amenable to increasing atorvastatin. She will take 2 of the 10 mg tabs until completed, I will collaborate w/ PCP to order atorvastatin 20 mg daily She is amenable to getting back in w/ nephrology Dr. Cherylann Ratel. Will collaborate w/ PCP for referral  Patient Self Care Activities:  Patient will take medications as prescribed  Initial goal documentation         This is a list of the screening recommended for you and due dates:  Health Maintenance  Topic Date Due   DEXA scan (bone density measurement)  11/23/2017   COVID-19 Vaccine (4 - 2023-24 season) 06/20/2022   Zoster (Shingles) Vaccine (1 of 2) 07/08/2023*   Flu Shot  05/21/2023   Medicare Annual Wellness Visit  04/26/2024   DTaP/Tdap/Td vaccine (2 - Tdap) 07/26/2025   Pneumonia Vaccine  Completed   Hepatitis C Screening  Completed   HPV Vaccine  Aged Out   Colon Cancer Screening  Discontinued  *Topic was postponed. The date shown is not the original due date.    Advanced directives: no  Conditions/risks identified: none  Next appointment: Follow up in one year for your annual wellness visit 05/02/24 @ 1:00 pm in person   Preventive Care 65 Years and Older, Female Preventive care refers to lifestyle choices and visits with your health care provider that can promote health and wellness. What does preventive care include? A yearly physical exam. This is also called an annual well  check. Dental exams once or twice a year. Routine eye exams. Ask your health care provider how often you should have your eyes checked. Personal lifestyle choices, including: Daily care of your teeth and gums. Regular physical activity. Eating a healthy diet. Avoiding tobacco and drug use. Limiting alcohol use. Practicing safe sex. Taking low-dose aspirin every day. Taking vitamin and mineral supplements as recommended by your health care provider. What happens during an annual well  check? The services and screenings done by your health care provider during your annual well check will depend on your age, overall health, lifestyle risk factors, and family history of disease. Counseling  Your health care provider may ask you questions about your: Alcohol use. Tobacco use. Drug use. Emotional well-being. Home and relationship well-being. Sexual activity. Eating habits. History of falls. Memory and ability to understand (cognition). Work and work Astronomer. Reproductive health. Screening  You may have the following tests or measurements: Height, weight, and BMI. Blood pressure. Lipid and cholesterol levels. These may be checked every 5 years, or more frequently if you are over 70 years old. Skin check. Lung cancer screening. You may have this screening every year starting at age 43 if you have a 30-pack-year history of smoking and currently smoke or have quit within the past 15 years. Fecal occult blood test (FOBT) of the stool. You may have this test every year starting at age 102. Flexible sigmoidoscopy or colonoscopy. You may have a sigmoidoscopy every 5 years or a colonoscopy every 10 years starting at age 54. Hepatitis C blood test. Hepatitis B blood test. Sexually transmitted disease (STD) testing. Diabetes screening. This is done by checking your blood sugar (glucose) after you have not eaten for a while (fasting). You may have this done every 1-3 years. Bone density scan. This is done to screen for osteoporosis. You may have this done starting at age 75. Mammogram. This may be done every 1-2 years. Talk to your health care provider about how often you should have regular mammograms. Talk with your health care provider about your test results, treatment options, and if necessary, the need for more tests. Vaccines  Your health care provider may recommend certain vaccines, such as: Influenza vaccine. This is recommended every year. Tetanus, diphtheria, and  acellular pertussis (Tdap, Td) vaccine. You may need a Td booster every 10 years. Zoster vaccine. You may need this after age 10. Pneumococcal 13-valent conjugate (PCV13) vaccine. One dose is recommended after age 40. Pneumococcal polysaccharide (PPSV23) vaccine. One dose is recommended after age 74. Talk to your health care provider about which screenings and vaccines you need and how often you need them. This information is not intended to replace advice given to you by your health care provider. Make sure you discuss any questions you have with your health care provider. Document Released: 11/02/2015 Document Revised: 06/25/2016 Document Reviewed: 08/07/2015 Elsevier Interactive Patient Education  2017 [employer].  Fall Prevention in the Home Falls can cause injuries. They can happen to people of all ages. There are many things you can do to make your home safe and to help prevent falls. What can I do on the outside of my home? Regularly fix the edges of walkways and driveways and fix any cracks. Remove anything that might make you trip as you walk through a door, such as a raised step or threshold. Trim any bushes or trees on the path to your home. Use bright outdoor lighting. Clear any walking paths of anything that might  make someone trip, such as rocks or tools. Regularly check to see if handrails are loose or broken. Make sure that both sides of any steps have handrails. Any raised decks and porches should have guardrails on the edges. Have any leaves, snow, or ice cleared regularly. Use sand or salt on walking paths during winter. Clean up any spills in your garage right away. This includes oil or grease spills. What can I do in the bathroom? Use night lights. Install grab bars by the toilet and in the tub and shower. Do not use towel bars as grab bars. Use non-skid mats or decals in the tub or shower. If you need to sit down in the shower, use a plastic, non-slip stool. Keep  the floor dry. Clean up any water that spills on the floor as soon as it happens. Remove soap buildup in the tub or shower regularly. Attach bath mats securely with double-sided non-slip rug tape. Do not have throw rugs and other things on the floor that can make you trip. What can I do in the bedroom? Use night lights. Make sure that you have a light by your bed that is easy to reach. Do not use any sheets or blankets that are too big for your bed. They should not hang down onto the floor. Have a firm chair that has side arms. You can use this for support while you get dressed. Do not have throw rugs and other things on the floor that can make you trip. What can I do in the kitchen? Clean up any spills right away. Avoid walking on wet floors. Keep items that you use a lot in easy-to-reach places. If you need to reach something above you, use a strong step stool that has a grab bar. Keep electrical cords out of the way. Do not use floor polish or wax that makes floors slippery. If you must use wax, use non-skid floor wax. Do not have throw rugs and other things on the floor that can make you trip. What can I do with my stairs? Do not leave any items on the stairs. Make sure that there are handrails on both sides of the stairs and use them. Fix handrails that are broken or loose. Make sure that handrails are as long as the stairways. Check any carpeting to make sure that it is firmly attached to the stairs. Fix any carpet that is loose or worn. Avoid having throw rugs at the top or bottom of the stairs. If you do have throw rugs, attach them to the floor with carpet tape. Make sure that you have a light switch at the top of the stairs and the bottom of the stairs. If you do not have them, ask someone to add them for you. What else can I do to help prevent falls? Wear shoes that: Do not have high heels. Have rubber bottoms. Are comfortable and fit you well. Are closed at the toe. Do not  wear sandals. If you use a stepladder: Make sure that it is fully opened. Do not climb a closed stepladder. Make sure that both sides of the stepladder are locked into place. Ask someone to hold it for you, if possible. Clearly mark and make sure that you can see: Any grab bars or handrails. First and last steps. Where the edge of each step is. Use tools that help you move around (mobility aids) if they are needed. These include: Canes. Walkers. Scooters. Crutches. Turn on the lights when  you go into a dark area. Replace any light bulbs as soon as they burn out. Set up your furniture so you have a clear path. Avoid moving your furniture around. If any of your floors are uneven, fix them. If there are any pets around you, be aware of where they are. Review your medicines with your doctor. Some medicines can make you feel dizzy. This can increase your chance of falling. Ask your doctor what other things that you can do to help prevent falls. This information is not intended to replace advice given to you by your health care provider. Make sure you discuss any questions you have with your health care provider. Document Released: 08/02/2009 Document Revised: 03/13/2016 Document Reviewed: 11/10/2014 Elsevier Interactive Patient Education  2017 [employer].

## 2023-04-27 NOTE — Progress Notes (Signed)
Subjective:   Madison Franklin is a 77 y.o. female who presents for Medicare Annual (Subsequent) preventive examination.  Visit Complete: In person  Review of Systems     Cardiac Risk Factors include: advanced age (>17men, >65 women);hypertension;sedentary lifestyle     Objective:    Today's Vitals   04/27/23 1319 04/27/23 1320  BP: 138/74   Weight: 155 lb 6.4 oz (70.5 kg)   Height: 5' (1.524 m)   PainSc:  0-No pain   Body mass index is 30.35 kg/m.     04/27/2023    1:30 PM 03/23/2023    1:42 PM 07/22/2022   10:01 AM 02/21/2022    2:13 PM 10/28/2021    9:12 AM 07/23/2021   10:47 AM 05/29/2021    9:54 AM  Advanced Directives  Does Patient Have a Medical Advance Directive? No No No No No No No  Does patient want to make changes to medical advance directive?       No - Patient declined  Would patient like information on creating a medical advance directive? No - Patient declined No - Patient declined No - Patient declined No - Patient declined No - Patient declined No - Patient declined     Current Medications (verified) Outpatient Encounter Medications as of 04/27/2023  Medication Sig   acetaminophen (TYLENOL) 325 MG tablet Take 325 mg by mouth every 6 (six) hours as needed (for pain.).   albuterol (VENTOLIN HFA) 108 (90 Base) MCG/ACT inhaler USE 2 INHALATIONS BY MOUTH EVERY 6 HOURS AS NEEDED FOR WHEEZING  OR SHORTNESS OF BREATH   amLODipine (NORVASC) 5 MG tablet Take 1 tablet (5 mg total) by mouth daily.   aspirin EC 81 MG tablet Take 81 mg by mouth daily.   atorvastatin (LIPITOR) 20 MG tablet TAKE 1 TABLET BY MOUTH DAILY   Budeson-Glycopyrrol-Formoterol (BREZTRI AEROSPHERE) 160-9-4.8 MCG/ACT AERO Inhale 2 puffs into the lungs in the morning and at bedtime.   hydrochlorothiazide (MICROZIDE) 12.5 MG capsule TAKE 1 CAPSULE BY MOUTH DAILY   lisinopril (ZESTRIL) 40 MG tablet Take 1 tablet (40 mg total) by mouth daily.   OXYGEN Inhale into the lungs. Patient is on 3 liters   vitamin  B-12 (CYANOCOBALAMIN) 1000 MCG tablet Take 1,000 mcg by mouth daily.   Vitamin D, Cholecalciferol, 1000 units TABS Take 1 tablet by mouth daily.   No facility-administered encounter medications on file as of 04/27/2023.    Allergies (verified) Latex   History: Past Medical History:  Diagnosis Date   COPD (chronic obstructive pulmonary disease) (HCC)    Dyspnea    Hearing aid worn    bilateral   Hyperlipidemia    Hypertension    Osteoporosis    Personal history of tobacco use, presenting hazards to health 08/15/2015   Pulmonary hypertension, mild (HCC)    mild to moderate   Past Surgical History:  Procedure Laterality Date   ABDOMINAL HYSTERECTOMY     COLONOSCOPY WITH PROPOFOL N/A 11/14/2016   Procedure: COLONOSCOPY WITH PROPOFOL;  Surgeon: Midge Minium, MD;  Location: [facility];  Service: Endoscopy;  Laterality: N/A;   COLONOSCOPY WITH PROPOFOL N/A 11/10/2018   Procedure: COLONOSCOPY WITH PROPOFOL;  Surgeon: Toney Reil, MD;  Location: [facility];  Service: Endoscopy;  Laterality: N/A;   ESOPHAGOGASTRODUODENOSCOPY (EGD) WITH PROPOFOL  11/10/2018   Procedure: ESOPHAGOGASTRODUODENOSCOPY (EGD) WITH PROPOFOL;  Surgeon: Toney Reil, MD;  Location: [facility];  Service: Endoscopy;;   ESOPHAGOGASTRODUODENOSCOPY (EGD) WITH PROPOFOL N/A 05/11/2019  Procedure: ESOPHAGOGASTRODUODENOSCOPY (EGD) WITH PROPOFOL;  Surgeon: Toney Reil, MD;  Location: [facility];  Service: Endoscopy;  Laterality: N/A;   long nodule     POLYPECTOMY  11/14/2016   Procedure: POLYPECTOMY;  Surgeon: Midge Minium, MD;  Location: [facility];  Service: Endoscopy;;   POLYPECTOMY  11/10/2018   Procedure: POLYPECTOMY;  Surgeon: Toney Reil, MD;  Location: [facility];  Service: Endoscopy;;   RIGHT HEART CATH Right 07/14/2017   Procedure: RIGHT HEART CATH;  Surgeon: Yvonne Kendall, MD;  Location: [facility] INVASIVE CV LAB;  Service: Cardiovascular;   Laterality: Right;   Family History  Problem Relation Age of Onset   Heart disease Neg Hx    Social History   Socioeconomic History   Marital status: Married    Spouse name: Not on file   Number of children: Not on file   Years of education: 12   Highest education level: 12th grade  Occupational History   Occupation: retired  Tobacco Use   Smoking status: Former    Packs/day: 0.50    Years: 30.00    Additional pack years: 0.00    Total pack years: 15.00    Types: Cigarettes    Quit date: 01/17/2017    Years since quitting: 6.2   Smokeless tobacco: Never  Vaping Use   Vaping Use: Never used  Substance and Sexual Activity   Alcohol use: No    Alcohol/week: 0.0 standard drinks of alcohol   Drug use: No   Sexual activity: Yes  Other Topics Concern   Not on file  Social History Narrative   Not on file   Social Determinants of Health   Financial Resource Strain: Low Risk  (04/27/2023)   Overall Financial Resource Strain (CARDIA)    Difficulty of Paying Living Expenses: Not hard at all  Food Insecurity: No Food Insecurity (04/27/2023)   Hunger Vital Sign    Worried About Running Out of Food in the Last Year: Never true    Ran Out of Food in the Last Year: Never true  Transportation Needs: No Transportation Needs (04/27/2023)   PRAPARE - Administrator, Civil Service (Medical): No    Lack of Transportation (Non-Medical): No  Physical Activity: Insufficiently Active (04/27/2023)   Exercise Vital Sign    Days of Exercise per Week: 3 days    Minutes of Exercise per Session: 20 min  Stress: No Stress Concern Present (04/27/2023)   [employer] of Occupational Health - Occupational Stress Questionnaire    Feeling of Stress : Not at all  Social Connections: Moderately Integrated (04/27/2023)   Social Connection and Isolation Panel [NHANES]    Frequency of Communication with Friends and Family: Three times a week    Frequency of Social Gatherings with Friends and  Family: Not on file    Attends Religious Services: More than 4 times per year    Active Member of [employer] or Organizations: No    Attends Engineer, structural: Never    Marital Status: Married    Tobacco Counseling Counseling given: Not Answered   Clinical Intake:  Pre-visit preparation completed: Yes  Pain : No/denies pain Pain Score: 0-No pain     Nutritional Risks: None Diabetes: No  How often do you need to have someone help you when you read instructions, pamphlets, or other written materials from your doctor or pharmacy?: 1 - Never  Interpreter Needed?: No  Information entered by :: Kennedy Bucker, LPN  Activities of Daily Living    04/27/2023    1:31 PM  In your present state of health, do you have any difficulty performing the following activities:  Hearing? 1  Vision? 0  Difficulty concentrating or making decisions? 0  Walking or climbing stairs? 1  Comment gets [facility]  Dressing or bathing? 0  Doing errands, shopping? 0  Preparing Food and eating ? N  Using the Toilet? N  In the past six months, have you accidently leaked urine? N  Do you have problems with loss of bowel control? N  Managing your Medications? N  Managing your Finances? N  Housekeeping or managing your Housekeeping? Y    Patient Care Team: Marjie Skiff, NP as PCP - General (Nurse Practitioner) Midge Minium, MD as Consulting Physician (Gastroenterology) Mady Haagensen, MD (Internal Medicine) Leafy Ro, MD as Consulting Physician (General Surgery) Salena Saner, MD as Consulting Physician (Pulmonary Disease) Creig Hines, MD as Consulting Physician (Oncology) Zettie Pho, [facility] (Inactive) (Pharmacist)  Indicate any recent Medical Services you may have received from other than [facility] providers in the past year (date may be approximate).     Assessment:   This is a routine wellness examination for [place].  Hearing/Vision screen Hearing Screening - Comments::  Wears aids, both ears Vision Screening - Comments:: Wears glasses-   Dietary issues and exercise activities discussed:     Goals Addressed             This Visit's Progress    DIET - EAT MORE FRUITS AND VEGETABLES         Depression Screen    04/27/2023    1:28 PM 04/07/2023    2:18 PM 09/24/2022    1:34 PM 03/20/2022    8:19 AM 10/28/2021    9:21 AM 07/02/2021    1:55 PM 10/26/2020    9:18 AM  PHQ 2/9 Scores  PHQ - 2 Score 0 0 0 0 0 0 0  PHQ- 9 Score 0 1 1 2        Fall Risk    04/27/2023    1:31 PM 04/07/2023    2:18 PM 09/24/2022    1:34 PM 03/20/2022    8:19 AM 07/15/2021   10:10 AM  Fall Risk   Falls in the past year? 0 0 0 0 0  Number falls in past yr: 0 0 0 0   Injury with Fall? 0 0 0 0   Risk for fall due to : No Fall Risks No Fall Risks No Fall Risks No Fall Risks   Follow up Falls prevention discussed;Falls evaluation completed Falls evaluation completed Falls evaluation completed Falls evaluation completed     MEDICARE RISK AT HOME:  Medicare Risk at Home - 04/27/23 1332     Any stairs in or around the home? Yes    If so, are there any without handrails? No    Home free of loose throw rugs in walkways, pet beds, electrical cords, etc? Yes    Adequate lighting in your home to reduce risk of falls? Yes    Life alert? No    Use of a cane, walker or w/c? Yes   walker sometimes   Grab bars in the bathroom? Yes    Shower chair or bench in shower? Yes    Elevated toilet seat or a handicapped toilet? Yes             TIMED UP AND GO:  Was the test  performed?  Yes  Length of time to ambulate 10 feet: 4 sec Gait steady and fast without use of assistive device    Cognitive Function:        04/27/2023    1:33 PM 10/28/2021    9:11 AM 10/26/2020    9:20 AM 10/14/2018    8:53 AM 09/30/2017   11:27 AM  6CIT Screen  What Year? 0 points 0 points 0 points 0 points 0 points  What month? 0 points 0 points 0 points 0 points 0 points  What time? 0 points 0 points 0  points 0 points 0 points  Count back from 20 0 points 0 points 0 points 0 points 0 points  Months in reverse 0 points 2 points 4 points 2 points 0 points  Repeat phrase 0 points 0 points 10 points 10 points 4 points  Total Score 0 points 2 points 14 points 12 points 4 points    Immunizations Immunization History  Administered Date(s) Administered   Fluad Quad(high Dose 65+) 08/03/2020, 09/19/2021, 09/24/2022   Influenza, High Dose Seasonal PF 09/03/2016   Influenza,inj,Quad PF,6+ Mos 07/27/2015   Influenza,inj,quad, With Preservative 08/24/2018, 07/15/2019   Influenza-Unspecified 08/14/2017, 08/24/2018   Moderna Sars-Covid-2 Vaccination 10/18/2020   PFIZER(Purple Top)SARS-COV-2 Vaccination 12/15/2019, 01/11/2020   Pneumococcal Conjugate-13 01/12/2015   Pneumococcal Polysaccharide-23 07/01/2011   Td 07/27/2015    TDAP status: Up to date  Flu Vaccine status: Up to date  Pneumococcal vaccine status: Up to date  Covid-19 vaccine status: Completed vaccines  Qualifies for Shingles Vaccine? Yes   Zostavax completed No   Shingrix Completed?: No.    Education has been provided regarding the importance of this vaccine. Patient has been advised to call insurance company to determine out of pocket expense if they have not yet received this vaccine. Advised may also receive vaccine at local pharmacy or Health Dept. Verbalized acceptance and understanding.  Screening Tests Health Maintenance  Topic Date Due   DEXA SCAN  11/23/2017   COVID-19 Vaccine (4 - 2023-24 season) 06/20/2022   Zoster Vaccines- Shingrix (1 of 2) 07/08/2023 (Originally 11/22/1995)   INFLUENZA VACCINE  05/21/2023   Medicare Annual Wellness (AWV)  04/26/2024   DTaP/Tdap/Td (2 - Tdap) 07/26/2025   Pneumonia Vaccine 26+ Years old  Completed   Hepatitis C Screening  Completed   HPV VACCINES  Aged Out   Colonoscopy  Discontinued    Health Maintenance  Health Maintenance Due  Topic Date Due   DEXA SCAN  11/23/2017    COVID-19 Vaccine (4 - 2023-24 season) 06/20/2022    Colorectal cancer screening: No longer required.   Mammogram status: No longer required due to age.  Bone Density status: Ordered SCHEDULED FOR 06/17/23. Pt provided with contact info and advised to call to schedule appt.  Lung Cancer Screening: (Low Dose CT Chest recommended if Age 15-80 years, 20 pack-year currently smoking OR have quit w/in 15years.) does not qualify.    Additional Screening:  Hepatitis C Screening: does qualify; Completed 07/27/15  Vision Screening: Recommended annual ophthalmology exams for early detection of glaucoma and other disorders of the eye. Is the patient up to date with their annual eye exam?  No  Who is the provider or what is the name of the office in which the patient attends annual eye exams? NO ONE If pt is not established with a provider, would they like to be referred to a provider to establish care? No .   Dental Screening: Recommended annual dental  exams for proper oral hygiene   Community Resource Referral / Chronic Care Management: CRR required this visit?  No   CCM required this visit?  No     Plan:     I have personally reviewed and noted the following in the patient's chart:   Medical and social history Use of alcohol, tobacco or illicit drugs  Current medications and supplements including opioid prescriptions. Patient is not currently taking opioid prescriptions. Functional ability and status Nutritional status Physical activity Advanced directives List of other physicians Hospitalizations, surgeries, and ER visits in previous 12 months Vitals Screenings to include cognitive, depression, and falls Referrals and appointments  In addition, I have reviewed and discussed with patient certain preventive protocols, quality metrics, and best practice recommendations. A written personalized care plan for preventive services as well as general preventive health recommendations  were provided to patient.     Hal Hope, LPN   10/25/1094   After Visit Summary: (MyChart) Due to this being a telephonic visit, the after visit summary with patients personalized plan was offered to patient via MyChart   Nurse Notes: NONE

## 2023-05-06 DIAGNOSIS — J449 Chronic obstructive pulmonary disease, unspecified: Secondary | ICD-10-CM | POA: Diagnosis not present

## 2023-05-11 DIAGNOSIS — J449 Chronic obstructive pulmonary disease, unspecified: Secondary | ICD-10-CM | POA: Diagnosis not present

## 2023-05-15 DIAGNOSIS — M3501 Sicca syndrome with keratoconjunctivitis: Secondary | ICD-10-CM | POA: Diagnosis not present

## 2023-05-15 DIAGNOSIS — H2511 Age-related nuclear cataract, right eye: Secondary | ICD-10-CM | POA: Diagnosis not present

## 2023-05-15 DIAGNOSIS — H2512 Age-related nuclear cataract, left eye: Secondary | ICD-10-CM | POA: Diagnosis not present

## 2023-05-15 DIAGNOSIS — H538 Other visual disturbances: Secondary | ICD-10-CM | POA: Diagnosis not present

## 2023-05-27 ENCOUNTER — Other Ambulatory Visit: Payer: Self-pay | Admitting: Nurse Practitioner

## 2023-05-27 DIAGNOSIS — I10 Essential (primary) hypertension: Secondary | ICD-10-CM

## 2023-05-28 NOTE — Telephone Encounter (Signed)
Requested Prescriptions  Pending Prescriptions Disp Refills   lisinopril (ZESTRIL) 40 MG tablet [Pharmacy Med Name: Lisinopril 40 MG Oral Tablet] 100 tablet 0    Sig: TAKE 1 TABLET BY MOUTH DAILY     Cardiovascular:  ACE Inhibitors Failed - 05/27/2023  5:04 PM      Failed - Cr in normal range and within 180 days    Creatinine, Ser  Date Value Ref Range Status  09/24/2022 1.64 (H) 0.57 - 1.00 mg/dL Final         Failed - K in normal range and within 180 days    Potassium  Date Value Ref Range Status  09/24/2022 5.3 (H) 3.5 - 5.2 mmol/L Final         Passed - Patient is not pregnant      Passed - Last BP in normal range    BP Readings from Last 1 Encounters:  04/27/23 138/74         Passed - Valid encounter within last 6 months    Recent Outpatient Visits           1 month ago COPD, severe (HCC)   [place] [facility] [place], Corrie Dandy T, NP   6 months ago Chronic right-sided thoracic back pain   [place] [facility] Larae Grooms, NP   8 months ago Severe pulmonary hypertension (HCC)   [place] [facility] [place], Corrie Dandy T, NP   1 year ago Severe pulmonary hypertension (HCC)   [place] [facility] [place], Corrie Dandy T, NP   1 year ago Severe pulmonary hypertension (HCC)   [place] [facility] [place], Corrie Dandy T, NP       Future Appointments             In 4 months Cannady, [place] T, NP [place] [facility]             amLODipine (NORVASC) 5 MG tablet [Pharmacy Med Name: amLODIPine Besylate 5 MG Oral Tablet] 100 tablet 0    Sig: TAKE 1 TABLET BY MOUTH DAILY     Cardiovascular: Calcium Channel Blockers 2 Passed - 05/27/2023  5:04 PM      Passed - Last BP in normal range    BP Readings from Last 1 Encounters:  04/27/23 138/74         Passed - Last Heart Rate in normal range    Pulse Readings from Last 1 Encounters:  04/15/23 79         Passed - Valid  encounter within last 6 months    Recent Outpatient Visits           1 month ago COPD, severe (HCC)   [place] [facility] [place], [place] T, NP   6 months ago Chronic right-sided thoracic back pain   [place] [facility] Larae Grooms, NP   8 months ago Severe pulmonary hypertension (HCC)   [place] [facility] [place], Corrie Dandy T, NP   1 year ago Severe pulmonary hypertension (HCC)   [place] [place], Corrie Dandy T, NP   1 year ago Severe pulmonary hypertension (HCC)    [facility] [place], Dorie Rank, NP       Future Appointments             In 4 months Cannady, Dorie Rank, NP  [facility], [facility]

## 2023-06-01 ENCOUNTER — Telehealth: Payer: Self-pay | Admitting: Pulmonary Disease

## 2023-06-01 NOTE — Telephone Encounter (Signed)
I spoke with Terri at [place], she said Viviann Spare will be in the area tomorrow and will be going to look at her machine.  I notified the patient's daughter. She will let us know if Viviann Spare does not come tomorrow and I will call [employer] back.  Nothing further needed.

## 2023-06-01 NOTE — Telephone Encounter (Signed)
Patient's daughter, Marylene Land, called stating the patients POC machine is only holding battery for about 30 minutes. They have called [employer] for about 2 weeks and no one will come out and look at it. Patient would like to know if there is anything we can do to get [employer] to fix the machine. Please call Marylene Land back at [phone]

## 2023-06-01 NOTE — Telephone Encounter (Signed)
I spoke with Angela(DPR), she said they have reached out to [facility] multiple times to get the POC fixed. Each time they are told someone will come to their house and no one shows up. Last time they spoke with the office manage [facility]) and they were told Onalee Hua would be out to look at the POC and he never came out.

## 2023-06-02 NOTE — Telephone Encounter (Signed)
[employer] did not show up. I will "Teams" Foye Clock.

## 2023-06-02 NOTE — Telephone Encounter (Signed)
Spoke to [place] with [employer]. She reached out to [place] and was advised that he does have an order to go out to patient's home. Viviann Spare will be coming by this afternoon. It may be after 5:00.  Lm for patient's Patient's daughter, Lori(DPR) Spoke to patient. She is aware and voiced her understanding.  Nothing further needed.

## 2023-06-03 ENCOUNTER — Telehealth: Payer: Self-pay | Admitting: Pulmonary Disease

## 2023-06-03 NOTE — Telephone Encounter (Signed)
PT's daughter is calling because no none has came to give her portable oxygen machine. It is going on three weeks.

## 2023-06-03 NOTE — Telephone Encounter (Signed)
I spoke with Terri at [facility], she said Viviann Spare was in the area today and would be out today.  I notified the patient's daughter and gave her the number to [employer] to contact them.  Nothing further needed.

## 2023-06-06 DIAGNOSIS — J449 Chronic obstructive pulmonary disease, unspecified: Secondary | ICD-10-CM | POA: Diagnosis not present

## 2023-06-10 ENCOUNTER — Other Ambulatory Visit: Payer: Self-pay

## 2023-06-10 DIAGNOSIS — I272 Pulmonary hypertension, unspecified: Secondary | ICD-10-CM

## 2023-06-10 DIAGNOSIS — J9611 Chronic respiratory failure with hypoxia: Secondary | ICD-10-CM

## 2023-06-10 NOTE — Progress Notes (Signed)
Received a fax from [employer] stating the patient's insurance company is no longer in network with [employer]. [employer] will need new O2 orders sent over.  I have placed the O2 order. Nothing further needed.

## 2023-06-11 DIAGNOSIS — J449 Chronic obstructive pulmonary disease, unspecified: Secondary | ICD-10-CM | POA: Diagnosis not present

## 2023-06-17 ENCOUNTER — Ambulatory Visit
Admission: RE | Admit: 2023-06-17 | Discharge: 2023-06-17 | Disposition: A | Discharge status: Home / Self Care | Payer: Medicare Other | Source: Ambulatory Visit | Attending: Nurse Practitioner | Admitting: Nurse Practitioner

## 2023-06-17 DIAGNOSIS — M8588 Other specified disorders of bone density and structure, other site: Secondary | ICD-10-CM | POA: Insufficient documentation

## 2023-06-17 DIAGNOSIS — M8589 Other specified disorders of bone density and structure, multiple sites: Secondary | ICD-10-CM | POA: Diagnosis not present

## 2023-06-18 ENCOUNTER — Other Ambulatory Visit: Payer: Self-pay | Admitting: Nurse Practitioner

## 2023-06-19 NOTE — Telephone Encounter (Signed)
Requested Prescriptions  Pending Prescriptions Disp Refills   albuterol (VENTOLIN HFA) 108 (90 Base) MCG/ACT inhaler [Pharmacy Med Name: ALBUTEROL HFA 90MCG/ACT (PA)] 34 g 2    Sig: USE 2 INHALATIONS BY MOUTH EVERY 6 HOURS AS NEEDED FOR WHEEZING  OR SHORTNESS OF BREATH     Pulmonology:  Beta Agonists 2 Passed - 06/18/2023 10:45 PM      Passed - Last BP in normal range    BP Readings from Last 1 Encounters:  04/27/23 138/74         Passed - Last Heart Rate in normal range    Pulse Readings from Last 1 Encounters:  04/15/23 79         Passed - Valid encounter within last 12 months    Recent Outpatient Visits           2 months ago COPD, severe (HCC)   [place] [facility] [place], [place] T, NP   7 months ago Chronic right-sided thoracic back pain   [place] [facility] Larae Grooms, NP   8 months ago Severe pulmonary hypertension (HCC)   [place] [facility] [place], Corrie Dandy T, NP   1 year ago Severe pulmonary hypertension (HCC)   [place] [facility] [place], Corrie Dandy T, NP   1 year ago Severe pulmonary hypertension (HCC)   [place] [facility] [place], Dorie Rank, NP       Future Appointments             In 3 months Cannady, Dorie Rank, NP [place] [facility], [facility]

## 2023-06-23 ENCOUNTER — Inpatient Hospital Stay: Payer: Medicare Other | Attending: Oncology

## 2023-06-23 DIAGNOSIS — N183 Chronic kidney disease, stage 3 unspecified: Secondary | ICD-10-CM | POA: Insufficient documentation

## 2023-06-23 DIAGNOSIS — D509 Iron deficiency anemia, unspecified: Secondary | ICD-10-CM | POA: Diagnosis not present

## 2023-06-23 DIAGNOSIS — D631 Anemia in chronic kidney disease: Secondary | ICD-10-CM | POA: Diagnosis not present

## 2023-06-23 LAB — VITAMIN B12: Vitamin B-12: 1062 pg/mL — ABNORMAL HIGH (ref 180–914)

## 2023-06-23 LAB — FERRITIN: Ferritin: 122 ng/mL (ref 11–307)

## 2023-06-23 LAB — IRON AND TIBC
Iron: 64 ug/dL (ref 28–170)
Saturation Ratios: 21 % (ref 10.4–31.8)
TIBC: 311 ug/dL (ref 250–450)
UIBC: 247 ug/dL

## 2023-06-23 LAB — CBC
HCT: 42 % (ref 36.0–46.0)
Hemoglobin: 12.4 g/dL (ref 12.0–15.0)
MCH: 25.9 pg — ABNORMAL LOW (ref 26.0–34.0)
MCHC: 29.5 g/dL — ABNORMAL LOW (ref 30.0–36.0)
MCV: 87.9 fL (ref 80.0–100.0)
Platelets: 159 10*3/uL (ref 150–400)
RBC: 4.78 MIL/uL (ref 3.87–5.11)
RDW: 14.7 % (ref 11.5–15.5)
WBC: 9 10*3/uL (ref 4.0–10.5)
nRBC: 0 % (ref 0.0–0.2)

## 2023-07-13 DIAGNOSIS — N184 Chronic kidney disease, stage 4 (severe): Secondary | ICD-10-CM | POA: Diagnosis not present

## 2023-07-13 DIAGNOSIS — D631 Anemia in chronic kidney disease: Secondary | ICD-10-CM | POA: Diagnosis not present

## 2023-07-13 DIAGNOSIS — I10 Essential (primary) hypertension: Secondary | ICD-10-CM | POA: Diagnosis not present

## 2023-07-13 DIAGNOSIS — N2581 Secondary hyperparathyroidism of renal origin: Secondary | ICD-10-CM | POA: Diagnosis not present

## 2023-07-15 ENCOUNTER — Ambulatory Visit (INDEPENDENT_AMBULATORY_CARE_PROVIDER_SITE_OTHER): Payer: Medicare Other | Admitting: Pulmonary Disease

## 2023-07-15 ENCOUNTER — Other Ambulatory Visit
Admission: RE | Admit: 2023-07-15 | Discharge: 2023-07-15 | Disposition: A | Discharge status: Home / Self Care | Payer: Medicare Other | Source: Ambulatory Visit | Attending: Pulmonary Disease | Admitting: Pulmonary Disease

## 2023-07-15 ENCOUNTER — Encounter: Payer: Self-pay | Admitting: Pulmonary Disease

## 2023-07-15 VITALS — BP 130/84 | HR 139 | Temp 98.3°F | Ht 60.0 in | Wt 156.0 lb

## 2023-07-15 DIAGNOSIS — I499 Cardiac arrhythmia, unspecified: Secondary | ICD-10-CM | POA: Diagnosis not present

## 2023-07-15 DIAGNOSIS — R942 Abnormal results of pulmonary function studies: Secondary | ICD-10-CM | POA: Diagnosis not present

## 2023-07-15 DIAGNOSIS — J9611 Chronic respiratory failure with hypoxia: Secondary | ICD-10-CM

## 2023-07-15 DIAGNOSIS — G4733 Obstructive sleep apnea (adult) (pediatric): Secondary | ICD-10-CM

## 2023-07-15 DIAGNOSIS — I272 Pulmonary hypertension, unspecified: Secondary | ICD-10-CM

## 2023-07-15 DIAGNOSIS — R600 Localized edema: Secondary | ICD-10-CM | POA: Insufficient documentation

## 2023-07-15 DIAGNOSIS — J449 Chronic obstructive pulmonary disease, unspecified: Secondary | ICD-10-CM | POA: Diagnosis not present

## 2023-07-15 LAB — CBC WITH DIFFERENTIAL/PLATELET
Abs Immature Granulocytes: 0.06 10*3/uL (ref 0.00–0.07)
Basophils Absolute: 0.1 10*3/uL (ref 0.0–0.1)
Basophils Relative: 1 %
Eosinophils Absolute: 0.2 10*3/uL (ref 0.0–0.5)
Eosinophils Relative: 2 %
HCT: 39.2 % (ref 36.0–46.0)
Hemoglobin: 12.2 g/dL (ref 12.0–15.0)
Immature Granulocytes: 1 %
Lymphocytes Relative: 11 %
Lymphs Abs: 0.9 10*3/uL (ref 0.7–4.0)
MCH: 26 pg (ref 26.0–34.0)
MCHC: 31.1 g/dL (ref 30.0–36.0)
MCV: 83.6 fL (ref 80.0–100.0)
Monocytes Absolute: 0.7 10*3/uL (ref 0.1–1.0)
Monocytes Relative: 8 %
Neutro Abs: 6.7 10*3/uL (ref 1.7–7.7)
Neutrophils Relative %: 77 %
Platelets: 155 10*3/uL (ref 150–400)
RBC: 4.69 MIL/uL (ref 3.87–5.11)
RDW: 14.6 % (ref 11.5–15.5)
Smear Review: NORMAL
WBC: 8.6 10*3/uL (ref 4.0–10.5)
nRBC: 0 % (ref 0.0–0.2)

## 2023-07-15 LAB — COMPREHENSIVE METABOLIC PANEL
ALT: 9 U/L (ref 0–44)
AST: 13 U/L — ABNORMAL LOW (ref 15–41)
Albumin: 4.3 g/dL (ref 3.5–5.0)
Alkaline Phosphatase: 80 U/L (ref 38–126)
Anion gap: 9 (ref 5–15)
BUN: 43 mg/dL — ABNORMAL HIGH (ref 8–23)
CO2: 20 mmol/L — ABNORMAL LOW (ref 22–32)
Calcium: 9.5 mg/dL (ref 8.9–10.3)
Chloride: 108 mmol/L (ref 98–111)
Creatinine, Ser: 1.92 mg/dL — ABNORMAL HIGH (ref 0.44–1.00)
GFR, Estimated: 26 mL/min — ABNORMAL LOW (ref >60)
Glucose, Bld: 100 mg/dL — ABNORMAL HIGH (ref 70–99)
Potassium: 4.4 mmol/L (ref 3.5–5.1)
Sodium: 137 mmol/L (ref 135–145)
Total Bilirubin: 0.9 mg/dL (ref 0.3–1.2)
Total Protein: 7.3 g/dL (ref 6.5–8.1)

## 2023-07-15 LAB — BRAIN NATRIURETIC PEPTIDE: B Natriuretic Peptide: 82.9 pg/mL (ref 0.0–100.0)

## 2023-07-15 LAB — MAGNESIUM: Magnesium: 2.1 mg/dL (ref 1.7–2.4)

## 2023-07-15 MED ORDER — IPRATROPIUM-ALBUTEROL 0.5-2.5 (3) MG/3ML IN SOLN: 3.0000 mL | Freq: Four times a day (QID) | RESPIRATORY_TRACT | 11 refills | Status: DC

## 2023-07-15 MED ORDER — DILTIAZEM HCL ER COATED BEADS 120 MG PO CP24: 120.0000 mg | ORAL_CAPSULE | Freq: Every day | ORAL | 11 refills | Status: DC

## 2023-07-15 NOTE — Patient Instructions (Addendum)
We are going to switch her medicines to via nebulizer.  The medication will be ipratropium/albuterol (DuoNeb) which will be 4 times a day via the nebulizer.  The nebulizer medication will hopefully cut back on the need for rescue albuterol.  We have ordered the nebulizer machine through [employer].  Continue using your CPAP machine.  We are starting a medication called Cardizem (diltiazem) 120 mg, this is 1 capsule daily.  This will be for your heart.  There is no if you have any problems with this medication.  STOP TAKING AMLODIPINE.  The Cardizem (diltiazem) is replacing this.  We have ordered some blood work, this will let us know if you need any additional medications in particular, a fluid medicine.  Continue using your oxygen.  We will see you in follow-up 6 to 8 weeks time call sooner should any new problems arise.

## 2023-07-15 NOTE — Progress Notes (Signed)
Subjective:    Patient ID: Madison Franklin, female    DOB: 02/13/1946, 77 y.o.   MRN: [id number]  Patient Care Team: Madison Skiff, NP as PCP - General (Nurse Practitioner) Madison Minium, MD as Consulting Physician (Gastroenterology) Madison Haagensen, MD (Internal Medicine) Madison Ro, MD as Consulting Physician (General Surgery) Madison Saner, MD as Consulting Physician (Pulmonary Disease) Madison Hines, MD as Consulting Physician (Oncology) Madison Franklin, [facility] (Inactive) (Pharmacist)  PT PROFILE: Madison Franklin is a former smoker (approx 15 P-Y history) initially referred for evaluation of exertional dyspnea   PROBLEMS: Former smoker Severe thoracic kyphoscoliosis SEVERE restrictive pulmonary physiology (chest wall) SEVERE pulmonary hypertension  Chief Complaint  Patient presents with   Follow-up    DOE. No wheezing or cough.    DATA: CT chest 03/13/17: Severe thoracic kyphoscoliosis. 4 mm nodule in left upper lobe (decreased in size from previously). Minimal emphysema. Echocardiogram 05/28/17: LVEF 60-65%.  Grade 1 diastolic dysfunction.  No significant valvular disease noted.  No evidence of right-sided overload. RHC 07/14/17: Mild to moderate pulmonary hypertension (mean PAP 34 mmHg, PVR 5.7 Wood units), referred back to pulmonary. PFTs 10/01/17: FVC: 1.28 L (49 %pred), FEV1: 0.83 > 0.91 L (41 > 45 %pred), FEV1/FVC: 65%; lung volume measurements invalid, DLCO 6.6 (31 %pred). ONO 10/26/17:  Minimal brief desaturation. Does not require nocturnal O2. LDCT chest 06/01/18: Lung-RADS 2, benign appearance or behavior. Aortic atherosclerosis, coronary artery atherosclerosis and emphysema. Pulmonary artery enlargement suggests pulmonary arterial hypertension. Gallbladder wall calcifications indicative of "porcelain gallbladder". Very large thyroid gland extending into upper mediastinum. 2D echo 06/12/2020: LVEF 60 to 65%.  Grade 1 diastolic dysfunction.  Elevated left atrial  pressure.  Increased size of right ventricle.  Severely elevated pulmonary artery systolic pressure.  Estimated right ventricular systolic pressure 61.6 mmHg. PFTs 06/12/2020: Severe restriction, element of obstruction also evident by flow volume loop.  Diffusion capacity impaired moderately. Sleep study 07/16/2020: REM related AHI of 77.1, low O2 sats at 65%.  Severe sleep apnea with associated hypoventilation due to severe scoliosis.On Madison Franklin @ 7 cm H2O Echocardiogram 03/28/2022: LVEF 60 to 65%, mild LVH, grade 1 DD, severely elevated pulmonary artery systolic pressure estimated 68.3 mmHg right ventricular systolic pressure relatively unchanged from prior   INTERVAL: Last seen 22 December 2022 by me.  She was given a trial of Breztri 2 puffs twice a day finds this beneficial.  Using Madison Franklin at 7 cm H2O compliant.  No complaints voiced.  Continue treatment regimen.   HPI Madison Franklin presents for follow-up today.  Recall she has very severe restrictive physiology due to severe thoracic scoliosis and restrictive and obstructive sleep apnea.  There is also an element of underlying COPD that cannot be quantitated accurately due to her restrictive physiology.  She has been using Madison Franklin at 7 cm water pressure for severe sleep apnea.  Uses oxygen at 3 L/min continuous for chronic respiratory failure with hypoxia.  She has been doing well using oxygen 24/7.  Tolerating the portable concentrator well. She does not endorse any fevers, chills or sweats.     Since starting oxygen therapy, and Madison Franklin, she sleeps well.  No dyspnea as long as she is using her oxygen.  Voices no active complaint today.    A prior visit we started her on Breztri 2 puffs twice a day and she noted that this medication helped her.  She has been however unable to use it due to cost.  She does not endorse any symptomatology  today.  She has not had any fevers, chills or sweats.   Madison Franklin compliance download: Patient has 100% usage in general and 90% usage and  nights over 4 hours x 30 days.  AHI reduced to 0.6.  She notes benefit of the therapy.   Review of Systems A 10 point review of systems was performed and it is as noted above otherwise negative.   Patient Active Problem List   Diagnosis Date Noted   Hearing aid worn 03/20/2022   Mold exposure 03/20/2021   OSA (obstructive sleep apnea) 03/20/2021   IFG (impaired fasting glucose) 03/20/2021   Aortic atherosclerosis (HCC) 07/29/2020   Chronic respiratory failure with hypoxia (HCC) 07/29/2020   severe restrictive pulmonary physiology due to severe kyphoscoliosis 07/04/2020   Porcelain gallbladder 11/30/2019   Severe pulmonary hypertension (HCC) 10/02/2019   Intestinal metaplasia of gastric mucosa    Chronic atrophic gastritis without bleeding 12/15/2018   B12 deficiency 08/04/2018   Nodule of upper lobe of left lung 05/26/2018   Anemia in chronic kidney disease 05/26/2018   Coronary artery calcification 06/18/2017   Heart murmur 04/13/2017   Chronic kidney disease, stage 3 (HCC) 04/13/2017   Advanced care planning/counseling discussion 01/05/2017   Hx of colonic polyps    Benign neoplasm of cecum    Osteopenia 07/26/2015   COPD, severe (HCC) 07/26/2015   Essential hypertension 07/26/2015   Hyperlipidemia 07/26/2015    Social History   Tobacco Use   Smoking status: Former    Current packs/day: 0.00    Average packs/day: 0.5 packs/day for 30.0 years (15.0 ttl pk-yrs)    Types: Cigarettes    Start date: 01/18/1987    Quit date: 01/17/2017    Years since quitting: 6.4   Smokeless tobacco: Never  Substance Use Topics   Alcohol use: No    Alcohol/week: 0.0 standard drinks of alcohol    Allergies  Allergen Reactions   Latex Itching and Rash    With latex gloves (when worn)    Current Meds  Medication Sig   acetaminophen (TYLENOL) 325 MG tablet Take 325 mg by mouth every 6 (six) hours as needed (for pain.).   albuterol (VENTOLIN HFA) 108 (90 Base) MCG/ACT inhaler USE 2  INHALATIONS BY MOUTH EVERY 6 HOURS AS NEEDED FOR WHEEZING  OR SHORTNESS OF BREATH   amLODipine (NORVASC) 5 MG tablet TAKE 1 TABLET BY MOUTH DAILY   aspirin EC 81 MG tablet Take 81 mg by mouth daily.   atorvastatin (LIPITOR) 20 MG tablet TAKE 1 TABLET BY MOUTH DAILY   hydrochlorothiazide (MICROZIDE) 12.5 MG capsule TAKE 1 CAPSULE BY MOUTH DAILY   lisinopril (ZESTRIL) 40 MG tablet TAKE 1 TABLET BY MOUTH DAILY   OXYGEN Inhale into the lungs. Patient is on 3 liters   vitamin B-12 (CYANOCOBALAMIN) 1000 MCG tablet Take 1,000 mcg by mouth daily.   Vitamin D, Cholecalciferol, 1000 units TABS Take 1 tablet by mouth daily.    Immunization History  Administered Date(s) Administered   Fluad Quad(high Dose 65+) 08/03/2020, 09/19/2021, 09/24/2022   Influenza, High Dose Seasonal PF 09/03/2016   Influenza,inj,Quad PF,6+ Mos 07/27/2015   Influenza,inj,quad, With Preservative 08/24/2018, 07/15/2019   Influenza-Unspecified 08/14/2017, 08/24/2018   Moderna Sars-Covid-2 Vaccination 10/18/2020   PFIZER(Purple Top)SARS-COV-2 Vaccination 12/15/2019, 01/11/2020   Pneumococcal Conjugate-13 01/12/2015   Pneumococcal Polysaccharide-23 07/01/2011   Td 07/27/2015      Objective:     BP 130/84 (BP Location: Right Arm, Cuff Size: Normal)   Pulse (!) 139   Temp  98.3 F (36.8 C)   Ht 5' (1.524 m)   Wt 156 lb (70.8 kg) Comment: per patient. in a wheelchair today  LMP  (LMP Unknown)   SpO2 91%   BMI 30.47 kg/m   SpO2: 91 % O2 Device: Nasal cannula O2 Flow Rate (L/min): 3 L/min O2 Type: Pulse O2  GENERAL: Awake alert, no distress.  Presents in transport chair, comfortable on oxygen via POC.   HEAD: Normocephalic, atraumatic.  Wears hearing aids. EYES: Pupils equal, round, reactive to light.  No scleral icterus.  MOUTH: Very poor dentition, oral mucosa moist. NECK: Supple.  Thyroid diffusely enlarged.  Deviated to the right. No JVD.  No adenopathy. PULMONARY: Distant breath sounds, coarse, no other  adventitious sounds. CARDIOVASCULAR: S1 and S2.  Tachycardic with chaotic rhythm at times.  Loud holosystolic murmur grade 4/6 best heard at the left sternal border  ABDOMEN: Soft, nondistended, nontender. MUSCULOSKELETAL: Very severe kyphoscoliosis.  1+ pitting edema of lower extremities. NEUROLOGIC: Very hard of hearing, no focal deficits. SKIN: Intact,warm,dry.  No overt rashes noted. PSYCH: Flat affect, normal behavior.  EKG performed: Baseline rhythm is sinus tachycardia however there are premature atrial complexes with aberrant conduction, query MAT.  Anterolateral infarct age undetermined.  Assessment & Plan:   No diagnosis found.  Orders Placed This Encounter  Procedures   CBC w/Diff    Standing Status:   Future    Standing Expiration Date:   01/12/2024   Comp Met (CMET)    Standing Status:   Future    Standing Expiration Date:   01/12/2024   Magnesium    Standing Status:   Future    Standing Expiration Date:   01/12/2024   B Nat Peptide    Standing Status:   Future    Standing Expiration Date:   07/14/2024   Thyroid Panel With TSH    Standing Status:   Future    Standing Expiration Date:   07/14/2024   EKG 12-Lead    Meds ordered this encounter  Medications   diltiazem (CARDIZEM CD) 120 MG 24 hr capsule    Sig: Take 1 capsule (120 mg total) by mouth daily.    Dispense:  30 capsule    Refill:  11   ipratropium-albuterol (DUONEB) 0.5-2.5 (3) MG/3ML SOLN    Sig: Take 3 mLs by nebulization 4 (four) times daily.    Dispense:  360 mL    Refill:  11   Also appears to be not very well compensated with regards to her COPD.  She is unable to afford Breztri.  Will switch to DuoNeb 4 times a day to see if this helps her.  During visit she was unaware of her heart rate being elevated.  She does not have the sense of tachypalpitations.  The tracing is more consistent with a potential MAT.  Will obtain laboratory data today to exclude electrolyte imbalance, check thyroid function.   Have switched amlodipine to Cardizem.  Will see the patient in follow-up in 6 to 8 weeks time she is to contact us prior to that time should any new difficulties arise.  Total visit time 50 minutes.  Gailen Shelter, MD Advanced Bronchoscopy PCCM [facility]-    *This note was dictated using voice recognition software/Dragon.  Despite best efforts to proofread, errors can occur which can change the meaning. Any transcriptional errors that result from this process are unintentional and may not be fully corrected at the time of dictation.

## 2023-07-16 LAB — THYROID PANEL WITH TSH
Free Thyroxine Index: 2 (ref 1.2–4.9)
T3 Uptake Ratio: 23 % — ABNORMAL LOW (ref 24–39)
T4, Total: 8.5 ug/dL (ref 4.5–12.0)
TSH: 0.463 u[IU]/mL (ref 0.450–4.500)

## 2023-08-01 ENCOUNTER — Other Ambulatory Visit: Payer: Self-pay | Admitting: Nurse Practitioner

## 2023-08-01 DIAGNOSIS — I10 Essential (primary) hypertension: Secondary | ICD-10-CM

## 2023-08-03 NOTE — Telephone Encounter (Signed)
Requested Prescriptions  Pending Prescriptions Disp Refills   amLODipine (NORVASC) 5 MG tablet [Pharmacy Med Name: amLODIPine Besylate 5 MG Oral Tablet] 100 tablet 2    Sig: TAKE 1 TABLET BY MOUTH DAILY     Cardiovascular: Calcium Channel Blockers 2 Failed - 08/01/2023 10:10 PM      Failed - Last Heart Rate in normal range    Pulse Readings from Last 1 Encounters:  07/15/23 (!) 139         Passed - Last BP in normal range    BP Readings from Last 1 Encounters:  07/15/23 130/84         Passed - Valid encounter within last 6 months    Recent Outpatient Visits           3 months ago COPD, severe (HCC)   [place] [facility] [place], [place] T, NP   9 months ago Chronic right-sided thoracic back pain   [place] [facility] Larae Grooms, NP   10 months ago Severe pulmonary hypertension (HCC)   [place] [facility] [place], Corrie Dandy T, NP   1 year ago Severe pulmonary hypertension (HCC)   [place] [facility] [place], Corrie Dandy T, NP   1 year ago Severe pulmonary hypertension (HCC)   [place] [facility] [place], Corrie Dandy T, NP       Future Appointments             In 2 months Cannady, Dorie Rank, NP [place] [facility]             lisinopril (ZESTRIL) 40 MG tablet [Pharmacy Med Name: Lisinopril 40 MG Oral Tablet] 100 tablet 0    Sig: TAKE 1 TABLET BY MOUTH DAILY     Cardiovascular:  ACE Inhibitors Failed - 08/01/2023 10:10 PM      Failed - Cr in normal range and within 180 days    Creatinine, Ser  Date Value Ref Range Status  07/15/2023 1.92 (H) 0.44 - 1.00 mg/dL Final         Passed - K in normal range and within 180 days    Potassium  Date Value Ref Range Status  07/15/2023 4.4 3.5 - 5.1 mmol/L Final         Passed - Patient is not pregnant      Passed - Last BP in normal range    BP Readings from Last 1 Encounters:  07/15/23 130/84         Passed - Valid  encounter within last 6 months    Recent Outpatient Visits           3 months ago COPD, severe (HCC)   [place] [facility] [place], Corrie Dandy T, NP   9 months ago Chronic right-sided thoracic back pain   [place] [facility] Larae Grooms, NP   10 months ago Severe pulmonary hypertension (HCC)   [place] [facility] [place], Corrie Dandy T, NP   1 year ago Severe pulmonary hypertension (HCC)   [place] [facility] [place], Corrie Dandy T, NP   1 year ago Severe pulmonary hypertension (HCC)   [place] [facility] [place], Dorie Rank, NP       Future Appointments             In 2 months Cannady, Dorie Rank, NP  [facility], [facility]

## 2023-08-04 ENCOUNTER — Telehealth: Payer: Self-pay | Admitting: Pulmonary Disease

## 2023-08-04 NOTE — Telephone Encounter (Signed)
I have sent urgent message to [employer] asking them to check on this issue

## 2023-08-04 NOTE — Telephone Encounter (Signed)
Pt's daughter states DME is waiting on documentation for nebulizer

## 2023-08-10 ENCOUNTER — Ambulatory Visit
Admission: RE | Admit: 2023-08-10 | Discharge: 2023-08-10 | Disposition: A | Discharge status: Home / Self Care | Payer: Medicare Other | Source: Ambulatory Visit | Attending: Pulmonary Disease | Admitting: Pulmonary Disease

## 2023-08-10 DIAGNOSIS — R600 Localized edema: Secondary | ICD-10-CM | POA: Insufficient documentation

## 2023-08-10 DIAGNOSIS — J449 Chronic obstructive pulmonary disease, unspecified: Secondary | ICD-10-CM | POA: Diagnosis not present

## 2023-08-10 DIAGNOSIS — R0600 Dyspnea, unspecified: Secondary | ICD-10-CM | POA: Insufficient documentation

## 2023-08-10 DIAGNOSIS — Z720 Tobacco use: Secondary | ICD-10-CM | POA: Diagnosis not present

## 2023-08-10 DIAGNOSIS — I272 Pulmonary hypertension, unspecified: Secondary | ICD-10-CM | POA: Insufficient documentation

## 2023-08-10 LAB — ECHOCARDIOGRAM COMPLETE
AR max vel: 1.95 cm2
AV Area VTI: 2.01 cm2
AV Area mean vel: 1.94 cm2
AV Mean grad: 3.5 mm[Hg]
AV Peak grad: 6.7 mm[Hg]
Ao pk vel: 1.29 m/s
Area-P 1/2: 7.51 cm2
S' Lateral: 2.4 cm

## 2023-08-12 ENCOUNTER — Other Ambulatory Visit: Payer: Self-pay

## 2023-08-12 DIAGNOSIS — I272 Pulmonary hypertension, unspecified: Secondary | ICD-10-CM

## 2023-08-27 ENCOUNTER — Ambulatory Visit: Payer: Medicare Other | Admitting: Pulmonary Disease

## 2023-08-27 ENCOUNTER — Encounter: Payer: Self-pay | Admitting: Pulmonary Disease

## 2023-08-27 VITALS — BP 110/70 | HR 126 | Temp 97.3°F | Ht 60.0 in | Wt 156.0 lb

## 2023-08-27 DIAGNOSIS — J9611 Chronic respiratory failure with hypoxia: Secondary | ICD-10-CM

## 2023-08-27 DIAGNOSIS — J449 Chronic obstructive pulmonary disease, unspecified: Secondary | ICD-10-CM | POA: Diagnosis not present

## 2023-08-27 DIAGNOSIS — G4733 Obstructive sleep apnea (adult) (pediatric): Secondary | ICD-10-CM

## 2023-08-27 DIAGNOSIS — R600 Localized edema: Secondary | ICD-10-CM | POA: Diagnosis not present

## 2023-08-27 DIAGNOSIS — I272 Pulmonary hypertension, unspecified: Secondary | ICD-10-CM | POA: Diagnosis not present

## 2023-08-27 DIAGNOSIS — Z23 Encounter for immunization: Secondary | ICD-10-CM

## 2023-08-27 DIAGNOSIS — R942 Abnormal results of pulmonary function studies: Secondary | ICD-10-CM

## 2023-08-27 DIAGNOSIS — I499 Cardiac arrhythmia, unspecified: Secondary | ICD-10-CM

## 2023-08-27 MED ORDER — BREZTRI AEROSPHERE 160-9-4.8 MCG/ACT IN AERO: 2.0000 | INHALATION_SPRAY | Freq: Two times a day (BID) | RESPIRATORY_TRACT | 11 refills | Status: DC

## 2023-08-27 MED ORDER — BREZTRI AEROSPHERE 160-9-4.8 MCG/ACT IN AERO: 2.0000 | INHALATION_SPRAY | Freq: Two times a day (BID) | RESPIRATORY_TRACT | 0 refills | Status: DC

## 2023-08-27 MED ORDER — LEVALBUTEROL HCL 1.25 MG/3ML IN NEBU: 1.2500 mg | INHALATION_SOLUTION | Freq: Three times a day (TID) | RESPIRATORY_TRACT | 3 refills | Status: DC | PRN

## 2023-08-27 MED ORDER — LEVALBUTEROL TARTRATE 45 MCG/ACT IN AERO: 2.0000 | INHALATION_SPRAY | Freq: Four times a day (QID) | RESPIRATORY_TRACT | 3 refills | Status: DC | PRN

## 2023-08-27 MED ORDER — DILTIAZEM HCL ER COATED BEADS 180 MG PO CP24: 180.0000 mg | ORAL_CAPSULE | Freq: Every day | ORAL | 3 refills | Status: DC

## 2023-08-27 NOTE — Patient Instructions (Signed)
VISIT SUMMARY:  During today's visit, we discussed your ongoing health concerns, including your COPD, thoracic kyphoscoliosis, pulmonary hypertension, tachycardia, and obstructive sleep apnea. We reviewed your current symptoms, particularly your shortness of breath and issues with your nebulizer treatment. We also addressed your high heart rate and the effectiveness of your CPAP machine. A plan was made to adjust your medications and equipment to better manage your conditions.  YOUR PLAN:  -CHRONIC OBSTRUCTIVE PULMONARY DISEASE (COPD): COPD is a chronic lung disease that makes it hard to breathe. We suspect you may be intolerant to your current nebulizer medication, so we are switching you back to [facility] at your request. We will also provide samples of the Breztri inhaler and refer you to an assistance program to help with its cost. Continue using levalbuterol as needed.  -THORACIC KYPHOSCOLIOSIS: Thoracic kyphoscoliosis is a severe curvature of the spine that affects your chest and lungs, making it harder to breathe. This condition exacerbates your COPD symptoms and complicates management.  -PULMONARY HYPERTENSION: Pulmonary hypertension is high blood pressure in the arteries of your lungs, which can lead to a chaotic heart rhythm. We will refer you to cardiology for follow-up to better manage this condition.  -TACHYCARDIA: Tachycardia is a condition where your heart beats faster than normal. We will increase your Cardizem (diltiazem) dose from 120 mg to 180 mg to help control your heart rate. Please finish your current supply of 120 mg before switching to the higher dose.  -OBSTRUCTIVE SLEEP APNEA: Obstructive sleep apnea is a condition where your breathing stops and starts during sleep. We need to adjust your CPAP mask for a better fit and encourage you to use the CPAP for more than 4 hours per night to improve your sleep quality and control apnea.  -GENERAL HEALTH MAINTENANCE: You have not  received the flu vaccine for the current season. We will administer the flu vaccine today to help protect you from the flu.  INSTRUCTIONS:  Please schedule a follow-up appointment in two months. Additionally, make sure to finish your current supply of Cardizem (diltiazem) 120 mg before switching to the 180 mg dose. Continue using your CPAP machine nightly and ensure it fits properly. If you experience any issues with the new nebulizer medications, contact our office immediately.

## 2023-08-27 NOTE — Progress Notes (Addendum)
Subjective:    Patient ID: Madison Franklin, female    DOB: 1946-06-11, 77 y.o.   MRN: [id number]  Patient Care Team: Madison Skiff, NP as PCP - General (Nurse Practitioner) Madison Minium, MD as Consulting Physician (Gastroenterology) Madison Haagensen, MD (Internal Medicine) Madison Ro, MD as Consulting Physician (General Surgery) Madison Saner, MD as Consulting Physician (Pulmonary Disease) Madison Hines, MD as Consulting Physician (Oncology) Madison Franklin, [facility] (Inactive) (Pharmacist)  Chief Complaint  Patient presents with   Follow-up    A lot better. No wheezing.     BACKGROUND/INTERVAL:Madison Franklin is a former smoker (approx 15 P-Y history) initially referred for evaluation of exertional dyspnea. Severe thoracic kyphoscoliosis, SEVERE restrictive pulmonary physiology (chest wall), SEVERE pulmonary hypertension.  HPI Discussed the use of AI scribe software for clinical note transcription with the patient, who gave verbal consent to proceed.  History of Present Illness   The patient, with a history of severe thoracic kyphoscoliosis, severe restrictive pulmonary physiology due to chest wall abnormality, severe pulmonary hypertension, and COPD, presents with complaints of shortness of breath. She reports that the shortness of breath occurs after using the nebulizer (DuoNeb). The patient also mentions feeling excessively hot, though it is unclear if this is related to the nebulizer use.  The patient has been using a nebulizer as a replacement for Breztri, which she had trouble affording. She also uses an albuterol inhaler, preferring a larger size for ease of use. The patient has been using a CPAP machine nightly, but data suggests that she is not sleeping well and the mask may be leaking.  The patient's heart rate remains high despite medication adjustments. She has a chaotic heart rhythm, likely related to her pulmonary hypertension. The patient also reports swelling, which has  been causing issues with shoe fit.  The patient has been prescribed Cardizem for her high heart rate, and it is suggested that the dose be increased from 120 to 180. The patient has a significant amount of the current dose left, and it is recommended that she finish the current supply before moving to the higher dose.  The patient is also considering applying for assistance to afford Breztri, as she felt it was more effective. The patient will continue to use the nebulizer as needed, but the medication will be changed to levalbuterol to avoid the shortness of breath she has been experiencing.   We reviewed her compliance report from 27 July 2019 24 through 25 August 2023.  She has used it 30 out of 30 days for 100% however usage over 4 hours is only been 57%.  It appears by reviewing the compliance download that she is having difficulties with mask fitting due to significant leaks.  Will procure new mask fitting for the patient.  She also has not had overnight oximetry ordered on her CPAP during her last visit.    DATA CT chest 03/13/17: Severe thoracic kyphoscoliosis. 4 mm nodule in left upper lobe (decreased in size from previously). Minimal emphysema. Echocardiogram 05/28/17: LVEF 60-65%.  Grade 1 diastolic dysfunction.  No significant valvular disease noted.  No evidence of right-sided overload. RHC 07/14/17: Mild to moderate pulmonary hypertension (mean PAP 34 mmHg, PVR 5.7 Wood units), referred back to pulmonary. PFTs 10/01/17: FVC: 1.28 L (49 %pred), FEV1: 0.83 > 0.91 L (41 > 45 %pred), FEV1/FVC: 65%; lung volume measurements invalid, DLCO 6.6 (31 %pred). ONO 10/26/17:  Minimal brief desaturation. Does not require nocturnal O2. LDCT chest 06/01/18: Lung-RADS 2,  benign appearance or behavior. Aortic atherosclerosis, coronary artery atherosclerosis and emphysema. Pulmonary artery enlargement suggests pulmonary arterial hypertension. Gallbladder wall calcifications indicative of "porcelain  gallbladder". Very large thyroid gland extending into upper mediastinum. 2D echo 06/12/2020: LVEF 60 to 65%.  Grade 1 diastolic dysfunction.  Elevated left atrial pressure.  Increased size of right ventricle.  Severely elevated pulmonary artery systolic pressure.  Estimated right ventricular systolic pressure 61.6 mmHg. PFTs 06/12/2020: Severe restriction, element of obstruction also evident by flow volume loop.  Diffusion capacity impaired moderately. Sleep study 07/16/2020: REM related AHI of 77.1, low O2 sats at 65%.  Severe sleep apnea with associated hypoventilation due to severe scoliosis.On CPAP @ 7 cm H2O Echocardiogram 03/28/2022: LVEF 60 to 65%, mild LVH, grade 1 DD, severely elevated pulmonary artery systolic pressure estimated 68.3 mmHg right ventricular systolic pressure relatively unchanged from prior  Review of Systems A 10 point review of systems was performed and it is as noted above otherwise negative.   Patient Active Problem List   Diagnosis Date Noted   Hearing aid worn 03/20/2022   Mold exposure 03/20/2021   OSA (obstructive sleep apnea) 03/20/2021   IFG (impaired fasting glucose) 03/20/2021   Aortic atherosclerosis (HCC) 07/29/2020   Chronic respiratory failure with hypoxia (HCC) 07/29/2020   severe restrictive pulmonary physiology due to severe kyphoscoliosis 07/04/2020   Porcelain gallbladder 11/30/2019   Severe pulmonary hypertension (HCC) 10/02/2019   Intestinal metaplasia of gastric mucosa    Chronic atrophic gastritis without bleeding 12/15/2018   B12 deficiency 08/04/2018   Nodule of upper lobe of left lung 05/26/2018   Anemia in chronic kidney disease 05/26/2018   Coronary artery calcification 06/18/2017   Heart murmur 04/13/2017   Chronic kidney disease, stage 3 (HCC) 04/13/2017   Advanced care planning/counseling discussion 01/05/2017   Hx of colonic polyps    Benign neoplasm of cecum    Osteopenia 07/26/2015   COPD, severe (HCC) 07/26/2015    Essential hypertension 07/26/2015   Hyperlipidemia 07/26/2015    Social History   Tobacco Use   Smoking status: Former    Current packs/day: 0.00    Average packs/day: 0.5 packs/day for 30.0 years (15.0 ttl pk-yrs)    Types: Cigarettes    Start date: 01/18/1987    Quit date: 01/17/2017    Years since quitting: 6.6   Smokeless tobacco: Never  Substance Use Topics   Alcohol use: No    Alcohol/week: 0.0 standard drinks of alcohol    Allergies  Allergen Reactions   Latex Itching and Rash    With latex gloves (when worn)    Current Meds  Medication Sig   acetaminophen (TYLENOL) 325 MG tablet Take 325 mg by mouth every 6 (six) hours as needed (for pain.).   aspirin EC 81 MG tablet Take 81 mg by mouth daily.   atorvastatin (LIPITOR) 20 MG tablet TAKE 1 TABLET BY MOUTH DAILY   Budeson-Glycopyrrol-Formoterol (BREZTRI AEROSPHERE) 160-9-4.8 MCG/ACT AERO Inhale 2 puffs into the lungs in the morning and at bedtime.   [START ON 09/07/2023] Budeson-Glycopyrrol-Formoterol (BREZTRI AEROSPHERE) 160-9-4.8 MCG/ACT AERO Inhale 2 puffs into the lungs in the morning and at bedtime.   [START ON 09/14/2023] diltiazem (CARDIZEM CD) 180 MG 24 hr capsule Take 1 capsule (180 mg total) by mouth daily.   hydrochlorothiazide (MICROZIDE) 12.5 MG capsule TAKE 1 CAPSULE BY MOUTH DAILY   levalbuterol (XOPENEX HFA) 45 MCG/ACT inhaler Inhale 2 puffs into the lungs every 6 (six) hours as needed for wheezing or shortness of breath.  levalbuterol (XOPENEX) 1.25 MG/3ML nebulizer solution Take 1.25 mg by nebulization every 8 (eight) hours as needed for wheezing.   lisinopril (ZESTRIL) 40 MG tablet TAKE 1 TABLET BY MOUTH DAILY   OXYGEN Inhale into the lungs. Patient is on 3 liters   vitamin B-12 (CYANOCOBALAMIN) 1000 MCG tablet Take 1,000 mcg by mouth daily.   Vitamin D, Cholecalciferol, 1000 units TABS Take 1 tablet by mouth daily.   [DISCONTINUED] albuterol (VENTOLIN HFA) 108 (90 Base) MCG/ACT inhaler USE 2  INHALATIONS BY MOUTH EVERY 6 HOURS AS NEEDED FOR WHEEZING  OR SHORTNESS OF BREATH   [DISCONTINUED] diltiazem (CARDIZEM CD) 120 MG 24 hr capsule Take 1 capsule (120 mg total) by mouth daily.   [DISCONTINUED] ipratropium-albuterol (DUONEB) 0.5-2.5 (3) MG/3ML SOLN Take 3 mLs by nebulization 4 (four) times daily.    Immunization History  Administered Date(s) Administered   Fluad Quad(high Dose 65+) 08/03/2020, 09/19/2021, 09/24/2022   Influenza, High Dose Seasonal PF 09/03/2016   Influenza,inj,Quad PF,6+ Mos 07/27/2015   Influenza,inj,quad, With Preservative 08/24/2018, 07/15/2019   Influenza-Unspecified 08/14/2017, 08/24/2018   Moderna Sars-Covid-2 Vaccination 10/18/2020   PFIZER(Purple Top)SARS-COV-2 Vaccination 12/15/2019, 01/11/2020   Pneumococcal Conjugate-13 01/12/2015   Pneumococcal Polysaccharide-23 07/01/2011   Td 07/27/2015        Objective:     BP 110/70 (BP Location: Left Arm, Cuff Size: Normal)   Pulse (!) 126   Temp (!) 97.3 F (36.3 C)   Ht 5' (1.524 m)   Wt 156 lb (70.8 kg) Comment: per patient. in a wheelchair today  LMP  (LMP Unknown)   SpO2 90%   BMI 30.47 kg/m   SpO2: 90 % O2 Device: Nasal cannula O2 Flow Rate (L/min): 3 L/min O2 Type: Pulse O2  GENERAL: Awake alert, no distress.  Presents in transport chair, comfortable on oxygen via POC.   HEAD: Normocephalic, atraumatic.  Wears hearing aids. EYES: Pupils equal, round, reactive to light.  No scleral icterus.  MOUTH: Very poor dentition, oral mucosa moist. NECK: Supple.  Thyroid diffusely enlarged.  Deviated to the right. No JVD.  No adenopathy. PULMONARY: Distant breath sounds, coarse, no other adventitious sounds. CARDIOVASCULAR: S1 and S2.  Tachycardic with chaotic rhythm at times.  Loud holosystolic murmur grade 4/6 best heard at the left sternal border  ABDOMEN: Soft, nondistended, nontender. MUSCULOSKELETAL: Very severe kyphoscoliosis.  2+ pitting edema of lower extremities. NEUROLOGIC: Very  hard of hearing, no focal deficits. SKIN: Intact,warm,dry.  No overt rashes noted. PSYCH: Flat affect, normal behavior.    Assessment & Plan:     ICD-10-CM   1. Severe pulmonary hypertension (HCC)  I27.20     2. Irregular cardiac rhythm  I49.9 Ambulatory referral to Cardiology    3. Chronic respiratory failure with hypoxia (HCC)  J96.11     4. Chronic obstructive pulmonary disease, unspecified COPD type (HCC)  J44.9     5. Severe restrictive pulmonary physiology due to severe kyphoscoliosis  R94.2     6. OSA (obstructive sleep apnea)  G47.33 AMB REFERRAL FOR DME    7. Bilateral lower extremity edema  R60.0     8. Need for influenza vaccination  Z23 Flu Vaccine Trivalent High Dose (Fluad)      Orders Placed This Encounter  Procedures   Flu Vaccine Trivalent High Dose (Fluad)   AMB REFERRAL FOR DME    Referral Priority:   Routine    Referral Type:   Durable Medical Equipment Purchase    Number of Visits Requested:   1  Ambulatory referral to Cardiology    Referral Priority:   Routine    Referral Type:   Consultation    Referral Reason:   Specialty Services Required    Number of Visits Requested:   1    Meds ordered this encounter  Medications   Budeson-Glycopyrrol-Formoterol (BREZTRI AEROSPHERE) 160-9-4.8 MCG/ACT AERO    Sig: Inhale 2 puffs into the lungs in the morning and at bedtime.    Dispense:  11.8 g    Refill:  0    Order Specific Question:   Lot Number?    Answer:   [id number]    Order Specific Question:   Expiration Date?    Answer:   11/20/2025    Order Specific Question:   Manufacturer?    Answer:   [employer] [[id number]]    Order Specific Question:   Quantity    Answer:   2   levalbuterol (XOPENEX) 1.25 MG/3ML nebulizer solution    Sig: Take 1.25 mg by nebulization every 8 (eight) hours as needed for wheezing.    Dispense:  72 mL    Refill:  3   diltiazem (CARDIZEM CD) 180 MG 24 hr capsule    Sig: Take 1 capsule (180 mg total) by mouth daily.     Dispense:  30 capsule    Refill:  3    Change in dose   Budeson-Glycopyrrol-Formoterol (BREZTRI AEROSPHERE) 160-9-4.8 MCG/ACT AERO    Sig: Inhale 2 puffs into the lungs in the morning and at bedtime.    Dispense:  10.7 g    Refill:  11   levalbuterol (XOPENEX HFA) 45 MCG/ACT inhaler    Sig: Inhale 2 puffs into the lungs every 6 (six) hours as needed for wheezing or shortness of breath.    Dispense:  1 each    Refill:  3   Discussion:    Chronic Obstructive Pulmonary Disease (COPD) Reports shortness of breath with current nebulizer treatment. Suspected intolerance to ipratropium. Plan to switch back to  Esto. Discussed potential benefits and risks, and alternative options if new regimen is not tolerated. - Switch back to [facility] at patient's request - Provide samples of Breztri inhaler - Refer to assistance program for [employer] inhaler - Continue albuterol as needed  Thoracic Kyphoscoliosis Severe thoracic kyphoscoliosis causing restrictive pulmonary physiology, exacerbating COPD symptoms and complicating management.  Pulmonary Hypertension Severe pulmonary hypertension contributing to chaotic heart rhythm and high pulmonary artery pressure. Requires cardiology follow-up. - Refer to cardiology for follow-up  Tachycardia Baseline tachycardia, previously managed with Cardizem (diltiazem) 120 mg. Plan to increase dose to 180 mg for better control. Informed consent obtained regarding potential side effects and the need to finish the current supply before switching doses. - Increase Cardizem (diltiazem) dose to 180 mg - Finish current supply of 120 mg before switching to 180 mg  Obstructive Sleep Apnea Uses CPAP nightly but has significant mask leaks and suboptimal usage of over 4 hours (57% of the time). Needs mask adjustment. Discussed the importance of using CPAP for more than 4 hours per night to improve sleep quality and control apnea. - Check and adjust CPAP mask for better fit,  DME referral sent. - Encourage usage of CPAP for more than 4 hours per night - Needs overnight oximetry as previously ordered on CPAP.  General Health Maintenance Has not received the flu vaccine for the current season. - Administer flu vaccine today  Follow-up - Schedule follow-up appointment in two months.     Gailen Shelter,  MD Advanced Bronchoscopy PCCM [facility]    *This note was generated using voice recognition software/Dragon and/or AI transcription program.  Despite best efforts to proofread, errors can occur which can change the meaning. Any transcriptional errors that result from this process are unintentional and may not be fully corrected at the time of dictation.

## 2023-09-09 ENCOUNTER — Other Ambulatory Visit: Payer: Self-pay | Admitting: Nurse Practitioner

## 2023-09-09 DIAGNOSIS — I10 Essential (primary) hypertension: Secondary | ICD-10-CM

## 2023-09-10 NOTE — Telephone Encounter (Signed)
Requested Prescriptions  Refused Prescriptions Disp Refills   amLODipine (NORVASC) 5 MG tablet [Pharmacy Med Name: amLODIPine Besylate 5 MG Oral Tablet] 100 tablet 2    Sig: TAKE 1 TABLET BY MOUTH DAILY     Cardiovascular: Calcium Channel Blockers 2 Failed - 09/09/2023  5:28 PM      Failed - Last Heart Rate in normal range    Pulse Readings from Last 1 Encounters:  08/27/23 (!) 126         Passed - Last BP in normal range    BP Readings from Last 1 Encounters:  08/27/23 110/70         Passed - Valid encounter within last 6 months    Recent Outpatient Visits           5 months ago COPD, severe (HCC)   [place] [facility], [place] T, NP   10 months ago Chronic right-sided thoracic back pain   [place] [facility] Larae Grooms, NP   11 months ago Severe pulmonary hypertension (HCC)   [place] [facility], Corrie Dandy T, NP   1 year ago Severe pulmonary hypertension (HCC)   [place] [facility] [place], Corrie Dandy T, NP   1 year ago Severe pulmonary hypertension (HCC)   [place] [facility] [employer], Dorie Rank, NP       Future Appointments             In 3 weeks Cannady, Dorie Rank, NP [place] [facility]

## 2023-09-22 ENCOUNTER — Encounter: Payer: Self-pay | Admitting: Oncology

## 2023-09-22 ENCOUNTER — Inpatient Hospital Stay: Payer: Medicare Other

## 2023-09-22 ENCOUNTER — Inpatient Hospital Stay: Payer: Medicare Other | Attending: Oncology | Admitting: Oncology

## 2023-09-22 VITALS — BP 110/65 | HR 94 | Temp 95.6°F | Resp 90 | Wt 152.0 lb

## 2023-09-22 DIAGNOSIS — D631 Anemia in chronic kidney disease: Secondary | ICD-10-CM | POA: Insufficient documentation

## 2023-09-22 DIAGNOSIS — D509 Iron deficiency anemia, unspecified: Secondary | ICD-10-CM

## 2023-09-22 DIAGNOSIS — J449 Chronic obstructive pulmonary disease, unspecified: Secondary | ICD-10-CM | POA: Insufficient documentation

## 2023-09-22 DIAGNOSIS — N183 Chronic kidney disease, stage 3 unspecified: Secondary | ICD-10-CM

## 2023-09-22 DIAGNOSIS — Z9981 Dependence on supplemental oxygen: Secondary | ICD-10-CM | POA: Diagnosis not present

## 2023-09-22 DIAGNOSIS — Z87891 Personal history of nicotine dependence: Secondary | ICD-10-CM | POA: Insufficient documentation

## 2023-09-22 LAB — IRON AND TIBC
Iron: 35 ug/dL (ref 28–170)
Saturation Ratios: 10 % — ABNORMAL LOW (ref 10.4–31.8)
TIBC: 343 ug/dL (ref 250–450)
UIBC: 308 ug/dL

## 2023-09-22 LAB — CBC
HCT: 42.3 % (ref 36.0–46.0)
Hemoglobin: 12.5 g/dL (ref 12.0–15.0)
MCH: 25.5 pg — ABNORMAL LOW (ref 26.0–34.0)
MCHC: 29.6 g/dL — ABNORMAL LOW (ref 30.0–36.0)
MCV: 86.2 fL (ref 80.0–100.0)
Platelets: 167 10*3/uL (ref 150–400)
RBC: 4.91 MIL/uL (ref 3.87–5.11)
RDW: 15.1 % (ref 11.5–15.5)
WBC: 9.8 10*3/uL (ref 4.0–10.5)
nRBC: 0 % (ref 0.0–0.2)

## 2023-09-22 LAB — FERRITIN: Ferritin: 51 ng/mL (ref 11–307)

## 2023-09-22 NOTE — Progress Notes (Signed)
Hematology/Oncology Consult note [facility]  [phone] [phone]  Patient Care Team: Marjie Skiff, NP as PCP - General (Nurse Practitioner) Midge Minium, MD as Consulting Physician (Gastroenterology) Mady Haagensen, MD (Internal Medicine) Leafy Ro, MD as Consulting Physician (General Surgery) Salena Saner, MD as Consulting Physician (Pulmonary Disease) Creig Hines, MD as Consulting Physician (Oncology) Zettie Pho, [facility] (Inactive) (Pharmacist)   Name of the patient: Madison Franklin  [id number]  [birth year]   Date of visit: 09/22/23  Diagnosis- anemia of chronic kidney disease as well as iron deficiency anemia    Chief complaint/ Reason for visit-routine follow-up of anemia  Heme/Onc history: patient is a 77 year old female who was seen previously by Dr. Merlene Pulling for iron deficiency anemia and anemia of chronic kidney disease.  She has received IV iron in the past.  Patient also has history of stage III CKD.   She has a history of colonic polyps.   Colonoscopy on 11/14/2016 revealed a sub-optimal prep. Findings included a 10 mm polyp in the cecum, seven 4-10 mm sessile polyps in the transverse colon, a 6 mm polyp in the descending colon, three 5-6 mm sessile polyps in the sigmoid colon,.  Pathology revealed 7 tubular adenomas, 2 hyperplastic polyps, and 2 inflammatory polyps.  Colonoscopy on 11/10/2018 revealed three 5 to 6 mm polyps in the ascending colon (2 tubular adenomas; sessile serrated adenoma), one 8 mm polyp in the transverse colon (tubular adenoma), one 5 mm polyp in the transverse colon (tubular adenoma), one 5 mm polyp in the sigmoid colon (tubular adenoma), one 7 mm polyp at the recto-sigmoid colon (hyperplastic polyp), and non-bleeding external and internal hemorrhoids.  She will have a follow-up colonoscopy in 3 years.  Patient has not required any EPO so far   EGD on 11/10/2018 revealed non-bleeding  erosive gastropathy. Pathology revealed chronic inactive atrophic gastritis with intestinal metaplasia.  There was a single non-bleeding erosion in the upper third of the esophagus.  EGD on 05/11/2019 revealed normal duodenal bulb and second portion of the duodenum. There was non-bleeding erosive gastropathy and gastric mucosal atrophy.  There was a normal gastroesophageal junction and esophagus. Pathology revealed chronic inactive atrophic gastritis with intestinal metaplasia, negative for H pylori.     Interval history-she is presently on continuous home oxygen 3 L for her COPD.  No recent hospitalizations.  ECOG PS- 3 Pain scale- 0   Review of systems- Review of Systems  Constitutional:  Positive for malaise/fatigue. Negative for chills, fever and weight loss.  HENT:  Negative for congestion, ear discharge and nosebleeds.   Eyes:  Negative for blurred vision.  Respiratory:  Positive for shortness of breath. Negative for cough, hemoptysis, sputum production and wheezing.   Cardiovascular:  Negative for chest pain, palpitations, orthopnea and claudication.  Gastrointestinal:  Negative for abdominal pain, blood in stool, constipation, diarrhea, heartburn, melena, nausea and vomiting.  Genitourinary:  Negative for dysuria, flank pain, frequency, hematuria and urgency.  Musculoskeletal:  Negative for back pain, joint pain and myalgias.  Skin:  Negative for rash.  Neurological:  Negative for dizziness, tingling, focal weakness, seizures, weakness and headaches.  Endo/Heme/Allergies:  Does not bruise/bleed easily.  Psychiatric/Behavioral:  Negative for depression and suicidal ideas. The patient does not have insomnia.       Allergies  Allergen Reactions   Latex Itching and Rash    With latex gloves (when worn)     Past Medical History:  Diagnosis Date  COPD (chronic obstructive pulmonary disease) (HCC)    Dyspnea    Hearing aid worn    bilateral   Hyperlipidemia    Hypertension     Osteoporosis    Personal history of tobacco use, presenting hazards to health 08/15/2015   Pulmonary hypertension, mild (HCC)    mild to moderate     Past Surgical History:  Procedure Laterality Date   ABDOMINAL HYSTERECTOMY     COLONOSCOPY WITH PROPOFOL N/A 11/14/2016   Procedure: COLONOSCOPY WITH PROPOFOL;  Surgeon: Midge Minium, MD;  Location: [facility];  Service: Endoscopy;  Laterality: N/A;   COLONOSCOPY WITH PROPOFOL N/A 11/10/2018   Procedure: COLONOSCOPY WITH PROPOFOL;  Surgeon: Toney Reil, MD;  Location: [facility];  Service: Endoscopy;  Laterality: N/A;   ESOPHAGOGASTRODUODENOSCOPY (EGD) WITH PROPOFOL  11/10/2018   Procedure: ESOPHAGOGASTRODUODENOSCOPY (EGD) WITH PROPOFOL;  Surgeon: Toney Reil, MD;  Location: [facility];  Service: Endoscopy;;   ESOPHAGOGASTRODUODENOSCOPY (EGD) WITH PROPOFOL N/A 05/11/2019   Procedure: ESOPHAGOGASTRODUODENOSCOPY (EGD) WITH PROPOFOL;  Surgeon: Toney Reil, MD;  Location: [facility];  Service: Endoscopy;  Laterality: N/A;   long nodule     POLYPECTOMY  11/14/2016   Procedure: POLYPECTOMY;  Surgeon: Midge Minium, MD;  Location: [facility];  Service: Endoscopy;;   POLYPECTOMY  11/10/2018   Procedure: POLYPECTOMY;  Surgeon: Toney Reil, MD;  Location: [facility];  Service: Endoscopy;;   RIGHT HEART CATH Right 07/14/2017   Procedure: RIGHT HEART CATH;  Surgeon: Yvonne Kendall, MD;  Location: [facility];  Service: Cardiovascular;  Laterality: Right;    Social History   Socioeconomic History   Marital status: Married    Spouse name: Not on file   Number of children: Not on file   Years of education: 12   Highest education level: 12th grade  Occupational History   Occupation: retired  Tobacco Use   Smoking status: Former    Current packs/day: 0.00    Average packs/day: 0.5 packs/day for 30.0 years (15.0 ttl pk-yrs)    Types: Cigarettes    Start  date: 01/18/1987    Quit date: 01/17/2017    Years since quitting: 6.6   Smokeless tobacco: Never  Vaping Use   Vaping status: Never Used  Substance and Sexual Activity   Alcohol use: No    Alcohol/week: 0.0 standard drinks of alcohol   Drug use: No   Sexual activity: Yes  Other Topics Concern   Not on file  Social History Narrative   Not on file   Social Determinants of Health   Financial Resource Strain: Low Risk  (04/27/2023)   Overall Financial Resource Strain (CARDIA)    Difficulty of Paying Living Expenses: Not hard at all  Food Insecurity: No Food Insecurity (04/27/2023)   Hunger Vital Sign    Worried About Running Out of Food in the Last Year: Never true    Ran Out of Food in the Last Year: Never true  Transportation Needs: No Transportation Needs (04/27/2023)   PRAPARE - Administrator, Civil Service (Medical): No    Lack of Transportation (Non-Medical): No  Physical Activity: Insufficiently Active (04/27/2023)   Exercise Vital Sign    Days of Exercise per Week: 3 days    Minutes of Exercise per Session: 20 min  Stress: No Stress Concern Present (04/27/2023)   [employer] of Occupational Health - Occupational Stress Questionnaire    Feeling of Stress : Not at all  Social Connections: Moderately Integrated (04/27/2023)   Social Connection and Isolation Panel [NHANES]    Frequency of Communication with Friends and Family: Three times a week    Frequency of Social Gatherings with Friends and Family: Not on file    Attends Religious Services: More than 4 times per year    Active Member of [employer] or Organizations: No    Attends Banker Meetings: Never    Marital Status: Married  Catering manager Violence: Not At Risk (04/27/2023)   Humiliation, Afraid, Rape, and Kick questionnaire    Fear of Current or Ex-Partner: No    Emotionally Abused: No    Physically Abused: No    Sexually Abused: No    Family History  Problem Relation Age of Onset   Heart  disease Neg Hx      Current Outpatient Medications:    albuterol (VENTOLIN HFA) 108 (90 Base) MCG/ACT inhaler, Inhale into the lungs., Disp: , Rfl:    acetaminophen (TYLENOL) 325 MG tablet, Take 325 mg by mouth every 6 (six) hours as needed (for pain.)., Disp: , Rfl:    aspirin EC 81 MG tablet, Take 81 mg by mouth daily., Disp: , Rfl:    atorvastatin (LIPITOR) 20 MG tablet, TAKE 1 TABLET BY MOUTH DAILY, Disp: 100 tablet, Rfl: 2   Budeson-Glycopyrrol-Formoterol (BREZTRI AEROSPHERE) 160-9-4.8 MCG/ACT AERO, Inhale 2 puffs into the lungs in the morning and at bedtime., Disp: 11.8 g, Rfl: 0   Budeson-Glycopyrrol-Formoterol (BREZTRI AEROSPHERE) 160-9-4.8 MCG/ACT AERO, Inhale 2 puffs into the lungs in the morning and at bedtime., Disp: 10.7 g, Rfl: 11   diltiazem (CARDIZEM CD) 180 MG 24 hr capsule, Take 1 capsule (180 mg total) by mouth daily., Disp: 30 capsule, Rfl: 3   hydrochlorothiazide (MICROZIDE) 12.5 MG capsule, TAKE 1 CAPSULE BY MOUTH DAILY, Disp: 100 capsule, Rfl: 2   levalbuterol (XOPENEX HFA) 45 MCG/ACT inhaler, Inhale 2 puffs into the lungs every 6 (six) hours as needed for wheezing or shortness of breath., Disp: 1 each, Rfl: 3   levalbuterol (XOPENEX) 1.25 MG/3ML nebulizer solution, Take 1.25 mg by nebulization every 8 (eight) hours as needed for wheezing., Disp: 72 mL, Rfl: 3   lisinopril (ZESTRIL) 40 MG tablet, TAKE 1 TABLET BY MOUTH DAILY, Disp: 100 tablet, Rfl: 0   OXYGEN, Inhale into the lungs. Patient is on 3 liters, Disp: , Rfl:    vitamin B-12 (CYANOCOBALAMIN) 1000 MCG tablet, Take 1,000 mcg by mouth daily., Disp: , Rfl:    Vitamin D, Cholecalciferol, 1000 units TABS, Take 1 tablet by mouth daily., Disp: , Rfl:   Physical exam:  Vitals:   09/22/23 1421  BP: 110/65  Pulse: 94  Resp: (!) 90  Temp: (!) 95.6 F (35.3 C)  TempSrc: Tympanic  Weight: 152 lb (68.9 kg)   Physical Exam Constitutional:      Comments: Sitting in a wheelchair.  Appears fatigued  Cardiovascular:      Rate and Rhythm: Normal rate and regular rhythm.     Heart sounds: Normal heart sounds.  Pulmonary:     Comments: Effort of  breathing increased.  Breath sounds decreased bilaterally diffusely Abdominal:     General: Bowel sounds are normal.     Palpations: Abdomen is soft.  Skin:    General: Skin is warm and dry.  Neurological:     Mental Status: She is alert and oriented to person, place, and time.         Latest Ref Rng & Units 07/15/2023  10:24 AM  CMP  Glucose 70 - 99 mg/dL 413   BUN 8 - 23 mg/dL 43   Creatinine 2.44 - 1.00 mg/dL 0.10   Sodium 272 - 536 mmol/L 137   Potassium 3.5 - 5.1 mmol/L 4.4   Chloride 98 - 111 mmol/L 108   CO2 22 - 32 mmol/L 20   Calcium 8.9 - 10.3 mg/dL 9.5   Total Protein 6.5 - 8.1 g/dL 7.3   Total Bilirubin 0.3 - 1.2 mg/dL 0.9   Alkaline Phos 38 - 126 U/L 80   AST 15 - 41 U/L 13   ALT 0 - 44 U/L 9       Latest Ref Rng & Units 09/22/2023    1:24 PM  CBC  WBC 4.0 - 10.5 K/uL 9.8   Hemoglobin 12.0 - 15.0 g/dL 64.4   Hematocrit 03.4 - 46.0 % 42.3   Platelets 150 - 400 K/uL 167      Assessment and plan- Patient is a 77 y.o. female here for routine follow-up of anemia of chronic kidney disease along with a component of iron deficiency  Patient is not presently anemic with a hemoglobin of 12.5.  Iron studies are currently pending.  She last received IV iron in June 2024.  It is unlikely that she will receive IV iron at this time.  Repeat CBC ferritin and iron studies in 4 and 8 months and I will see her back in 8 months   Visit Diagnosis 1. Anemia in stage 3 chronic kidney disease, unspecified whether stage 3a or 3b CKD (HCC)   2. Iron deficiency anemia, unspecified iron deficiency anemia type      Dr. Owens Shark, MD, MPH [facility] at [facility] [phone] 09/22/2023 2:51 PM

## 2023-09-28 ENCOUNTER — Telehealth: Payer: Self-pay | Admitting: Pulmonary Disease

## 2023-09-28 NOTE — Telephone Encounter (Signed)
Order for a new mask was placed on 08/27/2023. I spoke with Angela(DPR) and she said she has spoke with [employer] and [employer] about getting a new mask for her mother. She said she does not know what else they need. Syn[facility] Fail can you check into this?Thank you!

## 2023-09-28 NOTE — Telephone Encounter (Signed)
[employer] needs type of mask and size fax'd in for this PT. Daughter calling to let us know. Her name is Marylene Land [phone]  Fax is [phone]

## 2023-09-29 ENCOUNTER — Emergency Department
Admission: EM | Admit: 2023-09-29 | Discharge: 2023-10-21 | Disposition: E | Payer: Medicare Other | Attending: Emergency Medicine | Admitting: Emergency Medicine

## 2023-09-29 DIAGNOSIS — R0689 Other abnormalities of breathing: Secondary | ICD-10-CM | POA: Diagnosis not present

## 2023-09-29 DIAGNOSIS — R55 Syncope and collapse: Secondary | ICD-10-CM | POA: Diagnosis not present

## 2023-09-29 DIAGNOSIS — I469 Cardiac arrest, cause unspecified: Secondary | ICD-10-CM | POA: Diagnosis not present

## 2023-09-29 DIAGNOSIS — R404 Transient alteration of awareness: Secondary | ICD-10-CM | POA: Diagnosis not present

## 2023-09-29 DIAGNOSIS — Z743 Need for continuous supervision: Secondary | ICD-10-CM | POA: Diagnosis not present

## 2023-09-29 DIAGNOSIS — I499 Cardiac arrhythmia, unspecified: Secondary | ICD-10-CM | POA: Diagnosis not present

## 2023-09-29 MED ORDER — EPINEPHRINE 1 MG/10ML IJ SOSY
PREFILLED_SYRINGE | INTRAMUSCULAR | Status: AC | PRN
  Administered 2023-09-29: 1 mg via INTRAVENOUS

## 2023-09-30 NOTE — Telephone Encounter (Signed)
Per Unk Lightning patient is now deceased

## 2023-10-07 ENCOUNTER — Ambulatory Visit: Payer: Medicare Other | Admitting: Nurse Practitioner

## 2023-10-21 NOTE — ED Notes (Signed)
Epi ordered by Dr. Trinna Post, not Dr. Margarita Grizzle. Selected wrong Provider under Code Narrator.

## 2023-10-21 NOTE — Code Documentation (Signed)
Arrives via EMS @ 1324 78 yo female husband states she was walking to BR stumbled staggered and fell FD had IJ placed and lucas in place advised no shock [facility] gave:  ETCO2  3 Epis 1 Bicarb 1 calcium administered BG 220, no dm, renal Hx

## 2023-10-21 NOTE — Progress Notes (Signed)
Paged for pt death. Introduced self and services to family-brought family to pt room. Offered compassionate presence and listening ear. One more family member pending to come. Bereavement tray called. Please notify if needs change.

## 2023-10-21 NOTE — ED Provider Notes (Signed)
[facility] Provider Note    Event Date/Time   First MD Initiated Contact with Patient 2023/10/28 1338     (approximate)   History   Cardiac Arrest (PEA for 50 mins on arrival )   HPI  Madison Franklin is a 78 year old female who presented in cardiac arrest.  Per EMS, patient had a witnessed episode of loss of consciousness.  Patient was walking to the bathroom when she fell.  On fire arrival, patient was pulseless, had [place] device placed.  En route, she was given 3 epi, 1 bicarb, 1 calcium.  BGL 220.  Patient remained without ROSC throughout transport.  Initially PEA with possible agonal respirations, later asystole.  Total downtime prior to presentation of over an hour.    Physical Exam   General: Unconscious, cold, CPR in progress Skin: Cold Head: Normocephalic. Atraumatic. Neck: Supple, trachea midline. Eye: Pupils fixed, dilated, nonreactive Cardiovascular: Pulseless, no audible heart sound, CPR in progress with pulse present with compressions Respiratory: No spontaneous respirations, I-Gel in progress, bagging Gastrointestinal: distended MSK: No spontaneous movement, no signs of deformity.  Neurological: Unconscious, not responding to painful stimuli, GCS 3   ED Results / Procedures / Treatments   Labs (all labs ordered are listed, but only abnormal results are displayed) Labs Reviewed - No data to display   EKG EKG independently reviewed interpreted by myself (ER attending) demonstrates:    RADIOLOGY Imaging independently reviewed and interpreted by myself demonstrates:    PROCEDURES:  Critical Care performed: Yes, see critical care procedure note(s)  CRITICAL CARE Performed by: Trinna Post   Total critical care time: 30 minutes  Critical care time was exclusive of separately billable procedures and treating other patients.  Critical care was necessary to treat or prevent imminent or life-threatening deterioration.  Critical care  was time spent personally by me on the following activities: development of treatment plan with patient and/or surrogate as well as nursing, discussions with consultants, evaluation of patient's response to treatment, examination of patient, obtaining history from patient or surrogate, ordering and performing treatments and interventions, ordering and review of laboratory studies, ordering and review of radiographic studies, pulse oximetry and re-evaluation of patient's condition.   Procedure Name: Intubation Date/Time: 10-28-2023 5:08 PM  Performed by: Trinna Post, MDGrade View: Grade II Tube size: 7.5 mm Airway Equipment and Method: Bougie stylet Comments: Intubated during arrest during pulse check       MEDICATIONS ORDERED IN ED: Medications  EPINEPHrine (ADRENALIN) 1 MG/10ML injection (1 mg Intravenous Given 10-28-2023 1327)     IMPRESSION / MDM / ASSESSMENT AND PLAN / ED COURSE  I reviewed the triage vital signs and the nursing notes.  Differential diagnosis includes, but is not limited to, cardiac arrest secondary to ACS, PE, intracranial bleed, electrolyte abnormality  Patient's presentation is most consistent with acute presentation with potential threat to life or bodily function.  78 year old female presenting in cardiac arrest.  Patient with prolonged downtime prior to presentation.  Given 1 additional round of epinephrine here.  I-gel was exchanged for definitive airway as above.  Patient remained pulseless, asystole on final rhythm check.  With patient's prolonged downtime, time of death was called at 1333.  Family was updated.      FINAL CLINICAL IMPRESSION(S) / ED DIAGNOSES   Final diagnoses:  Cardiac arrest (HCC)     Rx / DC Orders   ED Discharge Orders     None        Note:  This document was prepared using Dragon voice recognition software and may include unintentional dictation errors.   Trinna Post, MD 10/03/2023 [phone]

## 2023-10-21 NOTE — ED Notes (Signed)
Family removed ring from left hand to take home.

## 2023-10-21 NOTE — Code Documentation (Addendum)
Replaced ET tube, pulse check at femoral and carotid, and no pulse palpated. Compressions and BMV resumed.

## 2023-10-21 NOTE — Code Documentation (Addendum)
Patient time of death occurred at 35. Pronounced by Dr. Trinna Post

## 2023-10-21 DEATH — deceased

## 2023-10-29 ENCOUNTER — Ambulatory Visit: Payer: Medicare Other | Admitting: Pulmonary Disease

## 2023-11-30 ENCOUNTER — Ambulatory Visit: Payer: Medicare Other | Admitting: Cardiovascular Disease

## 2023-12-07 ENCOUNTER — Encounter: Payer: Self-pay | Admitting: Pulmonary Disease

## 2024-01-21 ENCOUNTER — Other Ambulatory Visit: Payer: Medicare Other

## 2024-05-23 ENCOUNTER — Other Ambulatory Visit: Payer: Medicare Other

## 2024-05-23 ENCOUNTER — Ambulatory Visit: Payer: Medicare Other | Admitting: Oncology
